# Patient Record
Sex: Female | Born: 1960
Health system: Southern US, Community
[De-identification: ages and names within clinical notes are randomized; demographics above are authoritative.]

## PROBLEM LIST (undated history)

## (undated) DIAGNOSIS — I10 Essential (primary) hypertension: Secondary | ICD-10-CM

## (undated) DIAGNOSIS — T783XXA Angioneurotic edema, initial encounter: Secondary | ICD-10-CM

## (undated) DIAGNOSIS — F419 Anxiety disorder, unspecified: Secondary | ICD-10-CM

## (undated) DIAGNOSIS — E785 Hyperlipidemia, unspecified: Secondary | ICD-10-CM

## (undated) DIAGNOSIS — Z8673 Personal history of transient ischemic attack (TIA), and cerebral infarction without residual deficits: Secondary | ICD-10-CM

## (undated) DIAGNOSIS — J189 Pneumonia, unspecified organism: Secondary | ICD-10-CM

## (undated) DIAGNOSIS — Z8489 Family history of other specified conditions: Secondary | ICD-10-CM

## (undated) DIAGNOSIS — F439 Reaction to severe stress, unspecified: Secondary | ICD-10-CM

## (undated) DIAGNOSIS — F410 Panic disorder [episodic paroxysmal anxiety] without agoraphobia: Secondary | ICD-10-CM

## (undated) DIAGNOSIS — F329 Major depressive disorder, single episode, unspecified: Secondary | ICD-10-CM

## (undated) DIAGNOSIS — E119 Type 2 diabetes mellitus without complications: Secondary | ICD-10-CM

## (undated) DIAGNOSIS — F32A Depression, unspecified: Secondary | ICD-10-CM

## (undated) HISTORY — DX: Depression, unspecified: F32.A

## (undated) HISTORY — DX: Personal history of transient ischemic attack (TIA), and cerebral infarction without residual deficits: Z86.73

## (undated) HISTORY — DX: Angioneurotic edema, initial encounter: T78.3XXA

## (undated) HISTORY — DX: Anxiety disorder, unspecified: F41.9

## (undated) HISTORY — DX: Type 2 diabetes mellitus without complications: E11.9

## (undated) HISTORY — DX: Hyperlipidemia, unspecified: E78.5

## (undated) HISTORY — DX: Major depressive disorder, single episode, unspecified: F32.9

## (undated) HISTORY — DX: Essential (primary) hypertension: I10

## (undated) HISTORY — DX: Panic disorder (episodic paroxysmal anxiety): F41.0

## (undated) HISTORY — DX: Reaction to severe stress, unspecified: F43.9

---

## 2006-09-26 ENCOUNTER — Emergency Department (HOSPITAL_COMMUNITY): Admission: EM | Admit: 2006-09-26 | Discharge: 2006-09-26 | Payer: Self-pay | Admitting: Emergency Medicine

## 2008-11-29 HISTORY — PX: CHOLECYSTECTOMY: SHX55

## 2011-11-30 DIAGNOSIS — I10 Essential (primary) hypertension: Secondary | ICD-10-CM

## 2011-11-30 HISTORY — DX: Essential (primary) hypertension: I10

## 2011-12-04 ENCOUNTER — Encounter: Payer: Self-pay | Admitting: *Deleted

## 2011-12-04 ENCOUNTER — Other Ambulatory Visit: Payer: Self-pay

## 2011-12-04 ENCOUNTER — Emergency Department (HOSPITAL_COMMUNITY)
Admission: EM | Admit: 2011-12-04 | Discharge: 2011-12-05 | Disposition: A | Discharge status: Home / Self Care | Payer: 59 | Attending: Emergency Medicine | Admitting: Emergency Medicine

## 2011-12-04 DIAGNOSIS — J45909 Unspecified asthma, uncomplicated: Secondary | ICD-10-CM | POA: Insufficient documentation

## 2011-12-04 DIAGNOSIS — R000 Tachycardia, unspecified: Secondary | ICD-10-CM | POA: Insufficient documentation

## 2011-12-04 DIAGNOSIS — Z79899 Other long term (current) drug therapy: Secondary | ICD-10-CM | POA: Insufficient documentation

## 2011-12-04 DIAGNOSIS — J40 Bronchitis, not specified as acute or chronic: Secondary | ICD-10-CM

## 2011-12-04 HISTORY — DX: Pneumonia, unspecified organism: J18.9

## 2011-12-04 LAB — D-DIMER, QUANTITATIVE: D-Dimer, Quant: 0.31 ug/mL-FEU (ref 0.00–0.48)

## 2011-12-04 LAB — POCT I-STAT TROPONIN I: Troponin i, poc: 0.01 ng/mL (ref 0.00–0.08)

## 2011-12-04 LAB — POCT I-STAT, CHEM 8
BUN: 14 mg/dL (ref 6–23)
Calcium, Ion: 1.2 mmol/L (ref 1.12–1.32)
Chloride: 105 mEq/L (ref 96–112)
Creatinine, Ser: 0.8 mg/dL (ref 0.50–1.10)
Glucose, Bld: 156 mg/dL — ABNORMAL HIGH (ref 70–99)
HCT: 45 % (ref 36.0–46.0)
Hemoglobin: 15.3 g/dL — ABNORMAL HIGH (ref 12.0–15.0)
Potassium: 3.6 mEq/L (ref 3.5–5.1)
Sodium: 142 mEq/L (ref 135–145)
TCO2: 25 mmol/L (ref 0–100)

## 2011-12-04 MED ORDER — HYDROCOD POLST-CHLORPHEN POLST 10-8 MG/5ML PO LQCR
5.0000 mL | Freq: Once | ORAL | Status: AC
  Administered 2011-12-04: 5 mL via ORAL
  Filled 2011-12-04: qty 5

## 2011-12-04 MED ORDER — IPRATROPIUM BROMIDE 0.02 % IN SOLN
0.5000 mg | Freq: Once | RESPIRATORY_TRACT | Status: AC
  Administered 2011-12-05: 0.5 mg via RESPIRATORY_TRACT
  Filled 2011-12-04: qty 2.5

## 2011-12-04 MED ORDER — ALBUTEROL SULFATE (5 MG/ML) 0.5% IN NEBU
5.0000 mg | INHALATION_SOLUTION | Freq: Once | RESPIRATORY_TRACT | Status: AC
  Administered 2011-12-05: 5 mg via RESPIRATORY_TRACT
  Filled 2011-12-04: qty 0.5

## 2011-12-04 NOTE — ED Notes (Signed)
Pt reports cold for about a week that started on 12/13 "that lingered", recently treated with antibiotics and steroids by PCP with worsening symptoms so came to ED.

## 2011-12-04 NOTE — ED Provider Notes (Signed)
History     CSN: [id number]  Arrival date & time 12/04/11  1745   First MD Initiated Contact with Patient 12/04/11 2158      Chief Complaint  Patient presents with  . Shortness of Breath    HPI: Patient is a 51 y.o. female presenting with shortness of breath. The history is provided by the patient.  Shortness of Breath  The current episode started 5 to 7 days ago. The problem occurs continuously. The problem has been gradually worsening. The problem is moderate. The symptoms are relieved by rest. Associated symptoms include chest pressure, cough, shortness of breath and wheezing. Pertinent negatives include no fever. Her past medical history does not include asthma or past wheezing. There were no sick contacts.  Pt reports  onset of cold sx's on 11/11/2011. Cold lasted approximately one week but  persistent dry cough continued. Was seen by her primary care physician this past Wednesday for same, and placed on Z-Pak, prednisone, cough medicine, and inhaler. Since that time her cough has worsened, is now more productive and is now associated w/ chest pressure and shortness of .breath with activity and lying flat. Patient reports extensive travel by vehicle over the holidays. States traveled from here to [place] and [place] and then from [place] back here  on New Year's Day. Patient admits that she travels extensively by car and air both nationally and internationally for her job.  Past Medical History  Diagnosis Date  . Asthma   . Pneumonia     Past Surgical History  Procedure Date  . Cholecystectomy     Family History  Problem Relation Age of Onset  . Hypertension Mother   . Cancer Mother   . Hypertension Father   . Stroke Father     History  Substance Use Topics  . Smoking status: Never Smoker   . Smokeless tobacco: Never Used  . Alcohol Use: Yes     twice a year    OB History    Grav Para Term Preterm Abortions TAB SAB Ect Mult Living                   Review of Systems  Constitutional: Negative.  Negative for fever.  HENT: Negative.   Eyes: Negative.   Respiratory: Positive for cough, shortness of breath and wheezing.   Cardiovascular: Negative.   Gastrointestinal: Negative.   Genitourinary: Negative.   Musculoskeletal: Negative.   Skin: Negative.   Neurological: Negative.   Hematological: Negative.   Psychiatric/Behavioral: Negative.     Allergies  Review of patient's allergies indicates not on file.  Home Medications   Current Outpatient Rx  Name Route Sig Dispense Refill  . ALBUTEROL SULFATE HFA 108 (90 BASE) MCG/ACT IN AERS Inhalation Inhale 2 puffs into the lungs every 6 (six) hours as needed.      . AZITHROMYCIN 250 MG PO TABS Oral Take 250 mg by mouth daily. Started on Wednesday        BP 172/93  Pulse 106  Temp(Src) 98.6 F (37 C) (Oral)  Resp 20  Wt 200 lb (90.719 kg)  SpO2 96%  LMP 11/25/2011  Physical Exam  Constitutional: She is oriented to person, place, and time. She appears well-developed and well-nourished.  HENT:  Head: Normocephalic and atraumatic.  Eyes: Conjunctivae are normal.  Neck: Neck supple.  Cardiovascular: Normal rate and regular rhythm.   Pulmonary/Chest: She has decreased breath sounds. She has wheezes. She has no rhonchi. She has no rales.  Inspiratory/Expiratory wheezes L>R w/ frequent spastic type cough most most inspirations.  Mild tachypnea (24) and tachycardia (120)  Abdominal: Soft. Bowel sounds are normal.  Musculoskeletal: Normal range of motion.  Neurological: She is alert and oriented to person, place, and time.  Skin: Skin is warm and dry. Rash noted. Rash is papular. No erythema.  Psychiatric: She has a normal mood and affect.    ED Course  Procedures Pt w/ persistent, worsening cough that has not responded to Z-pack, Albuterol inhaler or Tussinex since Wed. Pt w/ hx of frequent travel and extended car travel over holidays.Will plan for neb here, tussinex,  D-Dimer and appropriate imaging and re-eval.   0015: D-dimer negative, chest x-ray shows peribronchial thickening consistent with bronchitis without focal pneumonia.  Patient admits neb treatment here has significantly helped her cough. States neb helped in PCP office as well, but she finds that the albuterol inhaler does not help at home. Discussed with patient that we would refill her Tussionex, and an aero-chamber to her inhaler treatments at home and provide referral to pulmonologist for further evaluation. I have discussed this patient with Dr. Patria Mane who is in agreement with discharge plan.  Patient and spouse verbalized understanding and are agreeable with plan to.   Labs Reviewed  I-STAT, CHEM 8  D-DIMER, QUANTITATIVE  POCT PREGNANCY, URINE   No results found.   No diagnosis found.    MDM  HPI/PE and clinical findings c/w bronchitis Pt w/ mild tachycardia (102) upon arrival now (116) after neb likely result of coughing and Beta adrenergic agonist effects of Albuterol HFA.          Roma Kayser Schorr, NP 12/05/11 [phone]

## 2011-12-05 ENCOUNTER — Emergency Department (HOSPITAL_COMMUNITY): Payer: 59

## 2011-12-05 LAB — URINALYSIS, ROUTINE W REFLEX MICROSCOPIC
Bilirubin Urine: NEGATIVE
Glucose, UA: NEGATIVE mg/dL
Hgb urine dipstick: NEGATIVE
Ketones, ur: NEGATIVE mg/dL
Leukocytes, UA: NEGATIVE
Nitrite: NEGATIVE
Protein, ur: NEGATIVE mg/dL
Specific Gravity, Urine: 1.022 (ref 1.005–1.030)
Urobilinogen, UA: 0.2 mg/dL (ref 0.0–1.0)
pH: 6.5 (ref 5.0–8.0)

## 2011-12-05 LAB — PREGNANCY, URINE: Preg Test, Ur: NEGATIVE

## 2011-12-05 MED ORDER — DIAZEPAM 5 MG PO TABS: 5.0000 mg | ORAL_TABLET | Freq: Every evening | ORAL | Status: AC | PRN

## 2011-12-05 MED ORDER — ALBUTEROL SULFATE HFA 108 (90 BASE) MCG/ACT IN AERS
2.0000 | INHALATION_SPRAY | RESPIRATORY_TRACT | Status: DC | PRN
  Administered 2011-12-05: 2 via RESPIRATORY_TRACT
  Filled 2011-12-05: qty 6.7

## 2011-12-05 MED ORDER — HYDROCOD POLST-CHLORPHEN POLST 10-8 MG/5ML PO LQCR: 5.0000 mL | Freq: Two times a day (BID) | ORAL | Status: DC | PRN

## 2011-12-05 NOTE — ED Notes (Signed)
Breathing treatment given at time order placed,  Charted late due to other emergency with another pt.

## 2011-12-05 NOTE — ED Provider Notes (Signed)
Medical screening examination/treatment/procedure(s) were performed by non-physician practitioner and as supervising physician I was immediately available for consultation/collaboration.   Kevin M Campos, MD 12/05/11 0109 

## 2011-12-06 ENCOUNTER — Telehealth: Payer: Self-pay

## 2011-12-06 NOTE — Telephone Encounter (Signed)
I spoke with spouse and pt is scheduled to come in and see PW on 12/07/11 at 11:15. Spouse is aware to have pt arrive 15 minutes early to fill out paperwork.

## 2011-12-07 ENCOUNTER — Ambulatory Visit (INDEPENDENT_AMBULATORY_CARE_PROVIDER_SITE_OTHER)
Admission: RE | Admit: 2011-12-07 | Discharge: 2011-12-07 | Disposition: A | Discharge status: Home / Self Care | Payer: 59 | Source: Ambulatory Visit

## 2011-12-07 ENCOUNTER — Encounter: Payer: Self-pay | Admitting: Critical Care Medicine

## 2011-12-07 ENCOUNTER — Ambulatory Visit (INDEPENDENT_AMBULATORY_CARE_PROVIDER_SITE_OTHER): Payer: 59 | Admitting: Critical Care Medicine

## 2011-12-07 VITALS — BP 140/100 | HR 121 | Temp 98.6°F | Ht 66.0 in | Wt 212.2 lb

## 2011-12-07 DIAGNOSIS — J45909 Unspecified asthma, uncomplicated: Secondary | ICD-10-CM | POA: Insufficient documentation

## 2011-12-07 DIAGNOSIS — R059 Cough, unspecified: Secondary | ICD-10-CM

## 2011-12-07 DIAGNOSIS — J329 Chronic sinusitis, unspecified: Secondary | ICD-10-CM

## 2011-12-07 DIAGNOSIS — R05 Cough: Secondary | ICD-10-CM

## 2011-12-07 MED ORDER — HYDROCOD POLST-CPM POLST ER 10-8 MG PO CP12: 1.0000 | ORAL_CAPSULE | Freq: Two times a day (BID) | ORAL | Status: DC | PRN

## 2011-12-07 MED ORDER — AMOXICILLIN-POT CLAVULANATE 875-125 MG PO TABS: 1.0000 | ORAL_TABLET | Freq: Two times a day (BID) | ORAL | Status: AC

## 2011-12-07 MED ORDER — BENZONATATE 100 MG PO CAPS: ORAL_CAPSULE | ORAL | Status: AC

## 2011-12-07 MED ORDER — OMEPRAZOLE 20 MG PO CPDR: 20.0000 mg | DELAYED_RELEASE_CAPSULE | Freq: Every day | ORAL | Status: DC

## 2011-12-07 MED ORDER — PREDNISONE 10 MG PO TABS: ORAL_TABLET | ORAL | Status: DC

## 2011-12-07 NOTE — Patient Instructions (Signed)
USe cough protocol with tussicaps/tessalon Start omeprazole one daily Start reflux diet : strict Start Augmentin one twice daily  Prednisone recycle 10mg   Take 4 for two days three for two days two for two days one for two days Use albuterol as needed Obtain a CT scan of sinuses Allergy panel/hypersensitivity panel today (lab draw) Return 3 weeks

## 2011-12-07 NOTE — Progress Notes (Signed)
Subjective:    Patient ID: Sheila Guerrero, female    DOB: [birth year], 51 y.o.   MRN: [id number]  HPI Comments: Typical URI viral mid 12/12.  Traveled over the [place],  Then saw PCP 12/01/11 and dx asthma, bronchitis.  Severe allergies as child but not asthma. Rx albuterol/zpak/pred and BD neb in office. Then developed a fever and worse. Over the weekend went to ED. In ED rx another neb med, r/o PE.  CXR: neg.  Not able to sleep or recline and holds a pillow. Rx valium and slept with this. Now is still coughing   Cough This is a new problem. The current episode started 1 to 4 weeks ago. The problem has been rapidly worsening. The problem occurs constantly. The cough is productive of sputum (mucus is clear). Associated symptoms include ear pain, a fever, nasal congestion, postnasal drip, a rash, rhinorrhea, a sore throat, shortness of breath and wheezing. Pertinent negatives include no chest pain, chills, ear congestion, eye redness, headaches, heartburn, hemoptysis or myalgias. Associated symptoms comments: Has chest heaviness. The symptoms are aggravated by exercise and lying down. Risk factors: mold exposure with bathrooms over summer  She has tried a beta-agonist inhaler and oral steroids (zpak) for the symptoms. The treatment provided no relief. Her past medical history is significant for asthma, bronchitis, environmental allergies and pneumonia. There is no history of bronchiectasis, COPD or emphysema. hx pneumonia 4 yrs ago, sees [place] allergy: cat, dog, mold, dust, angioedema of eyes       Review of Systems  Constitutional: Positive for fever. Negative for chills, appetite change and unexpected weight change.  HENT: Positive for ear pain, congestion, sore throat, rhinorrhea, sneezing, trouble swallowing, postnasal drip and sinus pressure. Negative for nosebleeds and dental problem.   Eyes: Negative for redness and itching.  Respiratory: Positive for cough, chest tightness, shortness  of breath and wheezing. Negative for hemoptysis.   Cardiovascular: Negative for chest pain, palpitations and leg swelling.  Gastrointestinal: Negative for heartburn, nausea, vomiting and diarrhea.  Genitourinary: Negative for dysuria.  Musculoskeletal: Negative for myalgias and joint swelling.  Skin: Positive for rash.  Neurological: Negative for headaches.  Hematological: Positive for environmental allergies. Does not bruise/bleed easily.  Psychiatric/Behavioral: Negative for dysphoric mood. The patient is not nervous/anxious.        Objective:   Physical Exam  Filed Vitals:   12/07/11 1111  BP: 140/100  Pulse: 121  Temp: 98.6 F (37 C)  TempSrc: Oral  Height: 5\' 6"  (1.676 m)  Weight: 212 lb 3.2 oz (96.253 kg)  SpO2: 93%    Gen: Pleasant, obese , in no distress,  normal affect  ENT: No lesions,  mouth clear,  oropharynx clear, +++ postnasal drip, bilateral nasal purulence and edema of turbinates  Neck: No JVD, no TMG, no carotid bruits  Lungs: No use of accessory muscles, no dullness to percussion, distant BS, pseudowheeze noted   Cardiovascular: RRR, heart sounds normal, no murmur or gallops, no peripheral edema  Abdomen: soft and NT, no HSM,  BS normal  Musculoskeletal: No deformities, no cyanosis or clubbing  Neuro: alert, non focal  Skin: Warm, no lesions or rashes  Ct Maxillofacial Ltd Wo Cm  12/07/2011  *RADIOLOGY REPORT*  Clinical Data:  Cough and sinus pressure.  CT LIMITED SINUSES WITHOUT CONTRAST  Technique:  Multidetector CT images of the paranasal sinuses were obtained in a single plane without contrast.  Comparison:  None  Findings:  There is mild mucosal thickening in the left sphenoid  sinus and left posterior ethmoid air cells.  Minimal disease in the left maxillary sinus.  Visualized right paranasal sinuses are clear.  Visualized intracranial structures are grossly normal.  No gross abnormality to the globes or orbits.  IMPRESSION: Mild sinus disease in  the left sphenoid sinus and left ethmoid air cells.  Original Report Authenticated By: Richarda Overlie, M.D.         Assessment & Plan:   Cough Cyclical cough d/t GERD and sinusitis with post nasal drip .  I doubt asthma or airway obstruction CT scan reveals sinusitis.   Plan USe cough protocol with tussicaps/tessalon Start omeprazole one daily Start reflux diet : strict Start Augmentin one twice daily  Prednisone recycle 10mg   Take 4 for two days three for two days two for two days one for two days Use albuterol as needed Allergy panel/hypersensitivity panel today (lab draw) Return 3 weeks     Updated Medication List Outpatient Encounter Prescriptions as of 12/07/2011  Medication Sig Dispense Refill  . albuterol (PROVENTIL HFA;VENTOLIN HFA) 108 (90 BASE) MCG/ACT inhaler Inhale 2 puffs into the lungs every 4 (four) hours as needed.       . cetirizine (ZYRTEC) 10 MG tablet Take 10 mg by mouth daily as needed.        . diazepam (VALIUM) 5 MG tablet Take 1 tablet (5 mg total) by mouth at bedtime as needed for anxiety or sleep.  10 tablet  0  . amoxicillin-clavulanate (AUGMENTIN) 875-125 MG per tablet Take 1 tablet by mouth 2 (two) times daily.  20 tablet  0  . benzonatate (TESSALON) 100 MG capsule Take 1-2 every 4hours per cough protocol  90 capsule  4  . Hydrocod Polst-Chlorphen Polst (TUSSICAPS) 10-8 MG CP12 Take 1 capsule by mouth 2 (two) times daily as needed. Per cough protocol  20 each  0  . omeprazole (PRILOSEC) 20 MG capsule Take 1 capsule (20 mg total) by mouth daily.  30 capsule  4  . predniSONE (DELTASONE) 10 MG tablet Take 4 for two days three for two days two for two days one for two days  20 tablet  0  . DISCONTD: azithromycin (ZITHROMAX) 250 MG tablet Take 250 mg by mouth daily. Started on Wednesday        . DISCONTD: chlorpheniramine-HYDROcodone (TUSSIONEX PENNKINETIC ER) 10-8 MG/5ML LQCR Take 5 mLs by mouth every 12 (twelve) hours as needed.  40 mL  0  . DISCONTD:  predniSONE (DELTASONE) 10 MG tablet Take 10 mg by mouth daily. 6 day does pack starting with 6 the first day decreasing one tablet daily for the next five days

## 2011-12-09 NOTE — Assessment & Plan Note (Addendum)
Cyclical cough d/t GERD and sinusitis with post nasal drip .  I doubt asthma or airway obstruction CT scan reveals sinusitis.   Plan USe cough protocol with tussicaps/tessalon Start omeprazole one daily Start reflux diet : strict Start Augmentin one twice daily  Prednisone recycle 10mg   Take 4 for two days three for two days two for two days one for two days Use albuterol as needed Allergy panel/hypersensitivity panel today (lab draw) Return 3 weeks

## 2011-12-13 ENCOUNTER — Other Ambulatory Visit: Payer: 59

## 2011-12-13 DIAGNOSIS — J329 Chronic sinusitis, unspecified: Secondary | ICD-10-CM

## 2011-12-13 DIAGNOSIS — R059 Cough, unspecified: Secondary | ICD-10-CM

## 2011-12-13 DIAGNOSIS — R05 Cough: Secondary | ICD-10-CM

## 2011-12-14 LAB — ALLERGY FULL PROFILE
Allergen, D pternoyssinus,d7: <0.1 kU/L (ref <0.35)
Allergen,Goose feathers, e70: <0.1 kU/L (ref <0.35)
Alternaria Alternata: <0.1 kU/L (ref <0.35)
Aspergillus fumigatus, IgG: <0.1 kU/L (ref <0.35)
Bahia Grass: <0.1 kU/L (ref <0.35)
Bermuda Grass: <0.1 kU/L (ref <0.35)
Box Elder IgE: <0.1 kU/L (ref <0.35)
Candida Albicans: <0.1 kU/L (ref <0.35)
Cat Dander: 1.07 kU/L — ABNORMAL HIGH (ref <0.35)
Common Ragweed: <0.1 kU/L (ref <0.35)
Curvularia lunata: <0.1 kU/L (ref <0.35)
D. farinae: <0.1 kU/L (ref <0.35)
Dog Dander: 5.07 kU/L — ABNORMAL HIGH (ref <0.35)
Elm IgE: <0.1 kU/L (ref <0.35)
Fescue: <0.1 kU/L (ref <0.35)
G005 Rye, Perennial: <0.1 kU/L (ref <0.35)
G009 Red Top: <0.1 kU/L (ref <0.35)
Goldenrod: <0.1 kU/L (ref <0.35)
Helminthosporium halodes: <0.1 kU/L (ref <0.35)
House Dust Hollister: 1.75 kU/L — ABNORMAL HIGH (ref <0.35)
IgE (Immunoglobulin E), Serum: 19.9 IU/mL (ref 0.0–180.0)
Lamb's Quarters: <0.1 kU/L (ref <0.35)
Oak: <0.1 kU/L (ref <0.35)
Plantain: <0.1 kU/L (ref <0.35)
Stemphylium Botryosum: <0.1 kU/L (ref <0.35)
Sycamore Tree: <0.1 kU/L (ref <0.35)
Timothy Grass: <0.1 kU/L (ref <0.35)

## 2011-12-15 ENCOUNTER — Telehealth: Payer: Self-pay | Admitting: Critical Care Medicine

## 2011-12-15 NOTE — Telephone Encounter (Signed)
Overbook the pt

## 2011-12-15 NOTE — Telephone Encounter (Signed)
Her allergy test shows multiple positive allergies Print a copy and send to her, we will review at next OV  I agree, no sleep is d/t prednisone, this will get better with time. She can try melatonin 3mg  po qhs   OTC for sleep

## 2011-12-15 NOTE — Telephone Encounter (Signed)
Called and spoke with pt. She states just finished pred taper yesterday and her cough is better so she is not taking the tussi caps anymore. She states that ever since she has been on prednisone she is only able to sleep a couple of hours every night. I advised that this is probably a s/e of the prednisone and should get better now since she has finished med. Pt verbalized understanding and states will see how she sleep tonight now off of pred.  She is requesting her lab results. Dr. Delford Field, please advise, thanks!

## 2011-12-15 NOTE — Telephone Encounter (Signed)
Pt is aware of recommendations from PW and labs placed in mail. Pt needs to schedule appt with PW around 12-28-2011 per last OV and no openings; Crystal and PW please advise and let patient know. Thanks.

## 2011-12-16 NOTE — Telephone Encounter (Signed)
ATC pt at home #.  NA and no option to leave message.  ATC pt on mobile #.  LMOM for pt TCB

## 2011-12-16 NOTE — Telephone Encounter (Signed)
Pt's husband, Thurmond Butts, called back and scheduled pt a f/u appt with PW for 12/28/11 at 10:00

## 2011-12-20 LAB — HYPERSENSITIVITY PNUEMONITIS PROFILE

## 2011-12-20 LAB — FUNGAL ANTIBODIES PANEL, ID-BLOOD
Aspergillus Flavus Antibodies: NEGATIVE
Aspergillus Niger Antibodies: NEGATIVE
Aspergillus fumigatus: NEGATIVE
Blastomyces Abs, Qn, DID: NEGATIVE
Coccidioides Antibody ID: NEGATIVE
Histoplasma Antibody, ID: NEGATIVE

## 2011-12-20 NOTE — Progress Notes (Signed)
Quick Note:  Called, spoke with pt's husband. Informed him allergy test is positive for allergies to mold and cat/dog dander per PW. He verbalized understanding of this and will inform pt. ______

## 2011-12-28 ENCOUNTER — Telehealth: Payer: Self-pay | Admitting: Critical Care Medicine

## 2011-12-28 ENCOUNTER — Ambulatory Visit (INDEPENDENT_AMBULATORY_CARE_PROVIDER_SITE_OTHER): Payer: 59 | Admitting: Critical Care Medicine

## 2011-12-28 ENCOUNTER — Encounter: Payer: Self-pay | Admitting: Critical Care Medicine

## 2011-12-28 DIAGNOSIS — J329 Chronic sinusitis, unspecified: Secondary | ICD-10-CM | POA: Insufficient documentation

## 2011-12-28 DIAGNOSIS — R059 Cough, unspecified: Secondary | ICD-10-CM

## 2011-12-28 DIAGNOSIS — R05 Cough: Secondary | ICD-10-CM

## 2011-12-28 DIAGNOSIS — J45909 Unspecified asthma, uncomplicated: Secondary | ICD-10-CM

## 2011-12-28 DIAGNOSIS — J309 Allergic rhinitis, unspecified: Secondary | ICD-10-CM

## 2011-12-28 MED ORDER — BUDESONIDE 180 MCG/ACT IN AEPB: 2.0000 | INHALATION_SPRAY | Freq: Two times a day (BID) | RESPIRATORY_TRACT | Status: DC

## 2011-12-28 MED ORDER — CICLESONIDE 37 MCG/ACT NA AERS: 1.0000 | INHALATION_SPRAY | Freq: Every day | NASAL | Status: DC

## 2011-12-28 NOTE — Progress Notes (Signed)
Subjective:    Patient ID: Sheila Guerrero, female    DOB: 1961/05/18, 51 y.o.   MRN: [id number]  Cough This is a new problem. The current episode started 1 to 4 weeks ago. The problem has been gradually improving. Episode frequency: worse at night and wheezes. The cough is productive of sputum (mucus is clear). Associated symptoms include nasal congestion, postnasal drip, shortness of breath and wheezing. Pertinent negatives include no chest pain, chills, ear congestion, ear pain, eye redness, fever, headaches, heartburn, hemoptysis, myalgias, rash, rhinorrhea or sore throat. Associated symptoms comments: Has chest heaviness. The symptoms are aggravated by exercise and lying down. Risk factors: mold exposure with bathrooms over summer  She has tried a beta-agonist inhaler and oral steroids (zpak) for the symptoms. The treatment provided no relief. Her past medical history is significant for asthma, bronchitis, environmental allergies and pneumonia. There is no history of bronchiectasis, COPD or emphysema. hx pneumonia 4 yrs ago, sees [place] allergy: cat, dog, mold, dust, angioedema of eyes    12/28/2011 At last OV we rec: USe cough protocol with tussicaps/tessalon Start omeprazole one daily Start reflux diet : strict Start Augmentin one twice daily  Prednisone recycle 10mg   Take 4 for two days three for two days two for two days one for two days Use albuterol as needed Allergy panel/hypersensitivity panel today (lab draw)>>>pos cat/dog dander, mold, house dust, IgE 20  Now only coughs at night, still clears throat in daytime. Now notes some sinus pressure.  Notes dyspnea at time and not able to pull up a full breath. No heartburn. No gas.  No dysphagia   Past Medical History  Diagnosis Date  . Asthma   . Pneumonia   . HTN (hypertension) 2013     Family History  Problem Relation Age of Onset  . Hypertension Mother   . Cancer Mother   . Hypertension Father   . Stroke Father       History   Social History  . Marital Status: Married    Spouse Name: N/A    Number of Children: N/A  . Years of Education: N/A   Occupational History  . Not on file.   Social History Main Topics  . Smoking status: Never Smoker   . Smokeless tobacco: Never Used  . Alcohol Use: Yes     twice a year  . Drug Use: No  . Sexually Active: Not on file   Other Topics Concern  . Not on file   Social History Narrative  . No narrative on file     No Known Allergies   Outpatient Prescriptions Prior to Visit  Medication Sig Dispense Refill  . albuterol (PROVENTIL HFA;VENTOLIN HFA) 108 (90 BASE) MCG/ACT inhaler Inhale 2 puffs into the lungs every 4 (four) hours as needed.       . cetirizine (ZYRTEC) 10 MG tablet Take 10 mg by mouth daily as needed.        Marland Kitchen omeprazole (PRILOSEC) 20 MG capsule Take 1 capsule (20 mg total) by mouth daily.  30 capsule  4  . Hydrocod Polst-Chlorphen Polst (TUSSICAPS) 10-8 MG CP12 Take 1 capsule by mouth 2 (two) times daily as needed. Per cough protocol  20 each  0  . predniSONE (DELTASONE) 10 MG tablet Take 4 for two days three for two days two for two days one for two days  20 tablet  0     Review of Systems  Constitutional: Negative for fever, chills, appetite change and unexpected weight  change.  HENT: Positive for congestion, postnasal drip and sinus pressure. Negative for ear pain, nosebleeds, sore throat, rhinorrhea, sneezing, trouble swallowing and dental problem.   Eyes: Negative for redness and itching.  Respiratory: Positive for cough, shortness of breath and wheezing. Negative for hemoptysis and chest tightness.   Cardiovascular: Negative for chest pain, palpitations and leg swelling.  Gastrointestinal: Negative for heartburn, nausea, vomiting and diarrhea.  Genitourinary: Negative for dysuria.  Musculoskeletal: Negative for myalgias and joint swelling.  Skin: Negative for rash.  Neurological: Negative for headaches.  Hematological:  Positive for environmental allergies. Does not bruise/bleed easily.  Psychiatric/Behavioral: Negative for dysphoric mood. The patient is not nervous/anxious.        Objective:   Physical Exam   Filed Vitals:   12/28/11 1006  BP: 138/100  Pulse: 121  Temp: 98.4 F (36.9 C)  TempSrc: Oral  Height: 5\' 6"  (1.676 m)  Weight: 214 lb 3.2 oz (97.16 kg)  SpO2: 99%    Gen: Pleasant, obese , in no distress,  normal affect  ENT: No lesions,  mouth clear,  oropharynx clear, + postnasal drip, reduced  bilateral nasal purulence and  Persistent edema of turbinates  Neck: No JVD, no TMG, no carotid bruits  Lungs: No use of accessory muscles, no dullness to percussion, distant BS, less  pseudowheeze noted   Cardiovascular: RRR, heart sounds normal, no murmur or gallops, no peripheral edema  Abdomen: soft and NT, no HSM,  BS normal  Musculoskeletal: No deformities, no cyanosis or clubbing  Neuro: alert, non focal  Skin: Warm, no lesions or rashes  CT Sinus: moderate  L sphenoid ethmoid sinusitis        Assessment & Plan:   Extrinsic asthma, unspecified Moderate persistent asthma with nocturnal symptoms exacerbated by reflux and associated postnasal drip syndrome with chronic allergic rhinitis associated chronic sinusitis and hypersensitivity to Aspergillus [place] and flavus and cat dog dander along with house dust  patient is marginally improved Plan Begin inhaled steroid with Pulmicort 2 inhalations  twice daily Begin nasal steroid 1 spray each nostril daily Referral to allergy will be made     Updated Medication List Outpatient Encounter Prescriptions as of 12/28/2011  Medication Sig Dispense Refill  . albuterol (PROVENTIL HFA;VENTOLIN HFA) 108 (90 BASE) MCG/ACT inhaler Inhale 2 puffs into the lungs every 4 (four) hours as needed.       . cetirizine (ZYRTEC) 10 MG tablet Take 10 mg by mouth daily as needed.        Marland Kitchen omeprazole (PRILOSEC) 20 MG capsule Take 1 capsule (20 mg  total) by mouth daily.  30 capsule  4  . budesonide (PULMICORT FLEXHALER) 180 MCG/ACT inhaler Inhale 2 puffs into the lungs 2 (two) times daily.  1 each  5  . Ciclesonide (ZETONNA) 37 MCG/ACT AERS Place 1 puff into the nose daily.  1 Inhaler  6  . DISCONTD: Hydrocod Polst-Chlorphen Polst (TUSSICAPS) 10-8 MG CP12 Take 1 capsule by mouth 2 (two) times daily as needed. Per cough protocol  20 each  0  . DISCONTD: predniSONE (DELTASONE) 10 MG tablet Take 4 for two days three for two days two for two days one for two days  20 tablet  0

## 2011-12-28 NOTE — Assessment & Plan Note (Signed)
Moderate persistent asthma with nocturnal symptoms exacerbated by reflux and associated postnasal drip syndrome with chronic allergic rhinitis associated chronic sinusitis and hypersensitivity to Aspergillus [place] and flavus and cat dog dander along with house dust  patient is marginally improved Plan Begin inhaled steroid with Pulmicort 2 inhalations  twice daily Begin nasal steroid 1 spray each nostril daily Referral to allergy will be made

## 2011-12-28 NOTE — Telephone Encounter (Signed)
I spoke with R[place] and she will re-fax forms they are needing Dr. Delford Field to fill out and fax back. She said the forms are needed by Feb 3rd. I asked that she send these to the attention of Crystal and will place this msg in Crystals box as a reminder. I asked that the forms be faxed to triage and will hand forms to Crystal once received.

## 2011-12-28 NOTE — Patient Instructions (Signed)
Stay on omeprazole Start pulmicort two puff twice daily Start Zetonna one puff daily each nostril, ok to substitute Referral to allergy to be made Return 2 months

## 2011-12-29 NOTE — Telephone Encounter (Signed)
Forms received and placed in PW look at. Will forward msg to Crystal and Dr. Delford Field.

## 2011-12-29 NOTE — Telephone Encounter (Signed)
Noted  But prob wont get to this until next week Advise them as such

## 2011-12-29 NOTE — Telephone Encounter (Signed)
PW reviewed this paperwork.  It needs to go to [employer].  I have sent it to them.  Will you please inform pt of this.  Thanks!

## 2011-12-29 NOTE — Telephone Encounter (Signed)
I spoke with pt and made her aware of this. She needed nothing further

## 2011-12-29 NOTE — Telephone Encounter (Signed)
Pt aware. I will forward message back to Crystal as a reminder. Carron Curie, CMA

## 2012-01-06 ENCOUNTER — Telehealth: Payer: Self-pay | Admitting: Critical Care Medicine

## 2012-01-06 MED ORDER — FLUTICASONE PROPIONATE 50 MCG/ACT NA SUSP: 2.0000 | Freq: Every day | NASAL | Status: DC

## 2012-01-06 NOTE — Telephone Encounter (Signed)
flonase ok with me: two puff daily each nostril

## 2012-01-06 NOTE — Telephone Encounter (Signed)
Dede Query is not covered by [employer]. She must first try at least one of the covered alternatives:  Fluticasone, Flunisolide, Nasonex or Triamcinolone. Per patient, she has never tried any other nasal sprays in the past and is fine with switching to Fluticasone. This is usually the cheaper alternative and she will call if she has any problems or this does not work for her. I will forward this to Dr. Delford Field so he is aware.

## 2012-01-06 NOTE — Telephone Encounter (Signed)
Received copies from [facility] 01/06/12. Forwarded  4pages to Dr. Sandra Cockayne review.

## 2012-01-10 ENCOUNTER — Telehealth: Payer: Self-pay | Admitting: Critical Care Medicine

## 2012-01-10 NOTE — Telephone Encounter (Signed)
I spoke with member services at [phone]. Member # [id number].  Patient needs to meet her remaining deductible of $161.96 before she will be eligible for a regular co-pay. Representative could not tell me what that co-pay would be. According to her insurance, it does not matter which medication she is being prescribed, she will still need to pay and meet that deductible.  Patient was notified of this and said she will pay the remaining deductible and pick up her inhaler. She no longer wants this medication changed. She will call if she has any further questions or problems.

## 2012-01-10 NOTE — Telephone Encounter (Signed)
Ok to substitute whatever is their tier one inhaled steroid same dose

## 2012-01-10 NOTE — Telephone Encounter (Signed)
Fax received from the pharmacy requesting a substitute for Pulmicort Flexhaler due to cost. This inhaler will cost the patient $161.96 every month. Please advise if an appropriate alternative is available for the patient.No Known Allergies

## 2012-02-11 ENCOUNTER — Telehealth: Payer: Self-pay | Admitting: Critical Care Medicine

## 2012-02-11 MED ORDER — AZITHROMYCIN 250 MG PO TABS: ORAL_TABLET | ORAL | Status: AC

## 2012-02-11 NOTE — Telephone Encounter (Signed)
Spoke with pt. She c/o dry hacky cough, "raw throat" and increased wheezing over the past 24 hours. She states that she woke up this am and "felt flushed", unsure if has fever or not. She states that she could not sleep last night due to cough and had to rest propped up in the bed, could not lie flat b/c this seemed to make wheeze and cough worse. She is unable to come in for ov today, currently in [place]. Please advise Dr. Delford Field, thanks! No Known Allergies

## 2012-02-11 NOTE — Telephone Encounter (Signed)
Call in to a local pharmacy: azithromycin 250mg  Take two once then one daily until gone  #6

## 2012-02-11 NOTE — Telephone Encounter (Signed)
I spoke with pt and is aware of PW recs. She stated she will be home tonight and would like rx sent to [address]. I have sent in rx and nothing further was needed

## 2012-04-05 ENCOUNTER — Telehealth: Payer: Self-pay | Admitting: Critical Care Medicine

## 2012-04-05 NOTE — Telephone Encounter (Signed)
Received copies from [facility] ,on 04/05/12 . Forwarded 4  pages to Dr. Delford Field ,for review.

## 2014-11-05 ENCOUNTER — Telehealth: Payer: Self-pay | Admitting: Critical Care Medicine

## 2014-11-05 NOTE — Telephone Encounter (Signed)
Rec'd from [place] forward 5 pages to Dr. Joya Gaskins

## 2015-04-14 ENCOUNTER — Telehealth: Payer: Self-pay | Admitting: Critical Care Medicine

## 2015-04-14 NOTE — Telephone Encounter (Signed)
Received records from [place] sent to [place] 04/14/15 fbg.

## 2015-08-29 ENCOUNTER — Telehealth: Payer: Self-pay | Admitting: Critical Care Medicine

## 2015-08-29 NOTE — Telephone Encounter (Signed)
Rec'd from [facility] forward 3 pages to Dr. Joya Gaskins

## 2016-04-27 ENCOUNTER — Ambulatory Visit: Payer: 59 | Admitting: Dietician

## 2017-02-04 ENCOUNTER — Ambulatory Visit (INDEPENDENT_AMBULATORY_CARE_PROVIDER_SITE_OTHER): Payer: 59 | Admitting: Psychiatry

## 2017-02-04 ENCOUNTER — Encounter (INDEPENDENT_AMBULATORY_CARE_PROVIDER_SITE_OTHER): Payer: Self-pay

## 2017-02-04 ENCOUNTER — Encounter (HOSPITAL_COMMUNITY): Payer: Self-pay | Admitting: Psychiatry

## 2017-02-04 VITALS — BP 148/90 | HR 118

## 2017-02-04 DIAGNOSIS — F410 Panic disorder [episodic paroxysmal anxiety] without agoraphobia: Secondary | ICD-10-CM | POA: Diagnosis not present

## 2017-02-04 DIAGNOSIS — F4322 Adjustment disorder with anxiety: Secondary | ICD-10-CM | POA: Diagnosis not present

## 2017-02-04 DIAGNOSIS — Z79899 Other long term (current) drug therapy: Secondary | ICD-10-CM | POA: Diagnosis not present

## 2017-02-04 DIAGNOSIS — Z818 Family history of other mental and behavioral disorders: Secondary | ICD-10-CM | POA: Diagnosis not present

## 2017-02-04 MED ORDER — ALPRAZOLAM 0.5 MG PO TABS: 0.5000 mg | ORAL_TABLET | Freq: Three times a day (TID) | ORAL | 0 refills | Status: AC | PRN

## 2017-02-04 NOTE — Progress Notes (Signed)
Psychiatric Initial Adult Assessment   Patient Identification: Sheila Guerrero MRN:  [id number] Date of Evaluation:  02/04/2017 Referral Source: Family provider Chief Complaint:  anixety, panic Visit Diagnosis:    ICD-9-CM ICD-10-CM   1. Panic 300.01 F41.0 ALPRAZolam (XANAX) 0.5 MG tablet  2. Adjustment disorder with anxious mood 309.24 F43.22    History of Present Illness:  Patient presents today for psychiatric assessment, at the recommendation of her PCP.  She shares with Probation officer that she has been out of work for the past one month, due to severe resurgence of her symptoms of angioedema. She shows Probation officer several pictures of her angioedema episodes, and describes the significant discomfort and anxiety associated with the episodes, as her face and eyes swell, and then continue to train over the ensuing 2-3 days. She reports that the triggers are numerous, including environmental exposures, anxiety, medications, new foods, and new smells. She reports that she is not sure why her angioedema symptoms are flaring up recently, and she is working with her medical providers to remove possible contributors.  She feels that her mood has been severely negatively affected by her recent increase in symptoms. Previously, her angioedema symptoms were not so severe, so she was able to continue working whenever she would have flareups. Now the symptoms are much worse, and she has associated panic attacks, and she remains anxious much of the day, worrying about the prospects of a potential exposure. She reports that she tries to plan out much of her day, to avoid any possible new exposure, in fear that it may invoke a angioedema attack. She reports that she has had to leave the grocery store several times, pull over her car several times, and cannot even step foot into certain restaurants or buildings, for fear that this may invoke a panic attack. She describes some symptoms of a core phobia beginning to start, and  prefers to stay home much of the time.  She reports that she has severe panic attacks, about 5-6 times a week, and reports that prior to her angioedema flaring up she did not struggle with panic attacks to this degree. She reports that she cannot even go into her office (at home) without becoming shaky and nervous and sweating. She feels embarrassed by her inability to perform her work due to her medical and panic symptoms.  Spent time discussing the prospects of using Xanax when necessary, given that many SSRIs contribute to angioedema, as does Wellbutrin. We also discussed possible using clonazepam instead of Xanax, if the patient has any rebound anxiety. She agrees to continue in therapy twice weekly, which she is currently doing through her [employer] application on her phone.  She agrees to follow-up with this Probation officer in about a month for medication management, and to continue to discuss ideas about how best to proceed with reducing her anxiety and panic, such that she is able to get back to work.  Associated Signs/Symptoms: Depression Symptoms:  feelings of worthlessness/guilt, difficulty concentrating, anxiety, panic attacks, loss of energy/fatigue, (Hypo) Manic Symptoms:  none Anxiety Symptoms:  Agoraphobia, Excessive Worry, Panic Symptoms, Social Anxiety, Psychotic Symptoms:  none PTSD Symptoms: Negative  Past Psychiatric History: No significant past psychiatric history  Previous Psychotropic Medications: Yes   Substance Abuse History in the last 12 months:  No.  Consequences of Substance Abuse: Negative  Past Medical History:  Past Medical History:  Diagnosis Date  . Angioedema   . Anxiety   . Asthma   . Diabetes mellitus, type II ([place])   .  HTN (hypertension) 2013  . Pneumonia     Past Surgical History:  Procedure Laterality Date  . CHOLECYSTECTOMY      Family Psychiatric History: Family history of panic attacks and depression  Family History:  Family History   Problem Relation Age of Onset  . Hypertension Mother   . Cancer Mother   . Hypertension Father   . Stroke Father     Social History:   Social History   Social History  . Marital status: Married    Spouse name: N/A  . Number of children: N/A  . Years of education: N/A   Social History Main Topics  . Smoking status: Never Smoker  . Smokeless tobacco: Never Used  . Alcohol use Yes     Comment: twice a year  . Drug use: No  . Sexual activity: Yes    Birth control/ protection: None   Other Topics Concern  . None   Social History Narrative  . None    Additional Social History: Works at [employer], lives with her husband  Allergies:  No Known Allergies  Metabolic Disorder Labs: No results found for: HGBA1C, MPG No results found for: PROLACTIN No results found for: CHOL, TRIG, HDL, CHOLHDL, VLDL, LDLCALC   Current Medications: Current Outpatient Prescriptions  Medication Sig Dispense Refill  . ALPRAZolam (XANAX) 0.5 MG tablet Take 1 tablet (0.5 mg total) by mouth 3 (three) times daily as needed for anxiety or sleep. 90 tablet 0   No current facility-administered medications for this visit.    Neurologic: Headache: Negative Seizure: Negative Paresthesias:Negative  Musculoskeletal: Strength & Muscle Tone: within normal limits Gait & Station: normal Patient leans: N/A  Psychiatric Specialty Exam: Review of Systems  Constitutional: Negative.   HENT: Positive for congestion.        Angioedema - mild presently   Respiratory: Negative.   Cardiovascular: Negative.   Gastrointestinal: Negative.   Genitourinary: Negative.   Musculoskeletal: Negative.   Neurological: Negative.   Psychiatric/Behavioral: Negative for suicidal ideas. The patient is nervous/anxious.     Blood pressure (!) 148/90, pulse (!) 118.There is no height or weight on file to calculate BMI.  General Appearance: Casual and Well Groomed  Eye Contact:  Good  Speech:  Clear and Coherent   Volume:  Normal  Mood:  Anxious  Affect:  Appropriate  Thought Process:  Coherent  Orientation:  Full (Time, Place, and Person)  Thought Content:  Logical  Suicidal Thoughts:  No  Homicidal Thoughts:  No  Memory:  Immediate;   Good  Judgement:  Good  Insight:  Good  Psychomotor Activity:  Normal  Concentration:  Concentration: Fair and Attention Span: Fair  Recall:  NA  Fund of Knowledge:Good  Language: Good  Akathisia:  Negative  Handed:  Right  AIMS (if indicated):  n/a  Assets:  Communication Skills Desire for Improvement Financial Resources/Insurance Housing Intimacy Leisure Time Resilience Social Support Talents/Skills Transportation Vocational/Educational  ADL's:  Intact  Cognition: WNL  Sleep:  8 hours nightly    Treatment Plan Summary: Remee Charley is a 57 year old female, who has a Education officer, community working for [employer], and is currently unable to perform her work due to significant panic and anxiety associated with a resurgence of her angioedema symptoms. It is unclear what the trigger has been for this recent increase in her symptoms, but she is quite anxious about angioedema attacks, avoidant of potential triggers, and preoccupied with the embarrassment of dealing with these attacks during her work. She  presents with mood symptoms secondary to medical condition, and adjustment disorder with anxious features, in addition to panic attacks with some agoraphobia.  We are limited on what medications we can use, given the risk of angioedema with SSRI, but we'll proceed with alprazolam, so that we can provide the patient with relief, and potentially make it easier for her to face her anxieties and remaster them as she had before.  - Initiate Xanax 0.5 mg 3 times daily as needed for anxiety or panic - FMLA paperwork filled out today in office, given that her angioedema and significant anxiety are contributing to her inability to work at this time - Return  to clinic in 4 weeks, and patient is to continue in therapy twice weekly  Aundra Dubin, MD 3/9/20189:55 AM

## 2017-02-16 ENCOUNTER — Encounter: Payer: Self-pay | Admitting: Neurology

## 2017-02-16 ENCOUNTER — Ambulatory Visit (INDEPENDENT_AMBULATORY_CARE_PROVIDER_SITE_OTHER): Payer: 59 | Admitting: Neurology

## 2017-02-16 DIAGNOSIS — F4489 Other dissociative and conversion disorders: Secondary | ICD-10-CM | POA: Diagnosis not present

## 2017-02-16 DIAGNOSIS — T783XXS Angioneurotic edema, sequela: Secondary | ICD-10-CM

## 2017-02-16 DIAGNOSIS — T783XXA Angioneurotic edema, initial encounter: Secondary | ICD-10-CM | POA: Insufficient documentation

## 2017-02-16 DIAGNOSIS — D841 Defects in the complement system: Secondary | ICD-10-CM | POA: Insufficient documentation

## 2017-02-16 NOTE — Progress Notes (Signed)
PATIENT: Sheila Guerrero DOB: [birth year]  Chief Complaint  Patient presents with  . Episode of Confusion/Fatigue    Reports episodes of confusion, fatigue, "zoning-out" events and anxiety.  It is thought that her panic attacks are contributing to these events and/or medication side effects.  She is no longer taking any medications except Xanax.  This has caused a new set of problems, including extremely high blood pressure.  She also has diabetes.   Marland Kitchen PCP    Harlan Stains, MD     HISTORICAL  Sheila Guerrero is a 56 years old right-handed female, seen in refer by her primary care Dr. Harlan Stains for evaluation of episode of fatigue, zoon out, initial evaluation is February 16 2017    I reviewed and summarized the referring note, she had a history of anxiety, diabetes, hypertension, hyperlipidemia, asthma, recurrent angioedema, she had a family history of angioedema, her father had intermittent tongue swelling.  She reported lifelong history of recurrent angioedema, had significant asthma and allergies since early childhood, spent a lot of time in hospital in oxygen tent, around 15, she began to have recurrent angioedema initially involving her hands when she touch some triggering material, as years goes by, she began to have recurrent angioedema mainly involving her eyelids, lips, occasionally abdominal area. She has been seen and tested by different allergist in the past, and was treated with long-term of antihistamine, variable course of steroid without significant improvement.   She is also dealing with mild depression anxiety over the years, but getting worse with her recurrent angioedema, also increased her working responsibility and stress related to work  Since September 2017, she noticed increased difficulty handling her job, frequent panic attacks, which often trigger her angioedema, she has hypertension, diabetes, was treated with metoprolol, metformin, jardiance, because the  frequency of her recurrent angioedema, she end up stopping her medications in January 2018 with some improvement of her spells, but she has elevated blood pressure today is 190 over 100 heart rate 120  In addition she complains of transient memory lapse, zoning out episode, when she was looking at her email, suddenly her email would move to the distance, she has difficulty focusing, lasting for a few minutes, there was also episodes while she was driving in a familiar route, she could not figure out where she was, she has to stop and get her bearing together. There was no seizure activity noticed, she described difficulty focusing, difficulty handling her high demanding job, frequent panic attacks. But some of the described zoning out, transient confusion episode and not associated with clear panic attack.   She also described episode of pouring hot tea on her hand, forgot to turn off the stove.  Laboratory evaluation in February 2018, A1c 6.0, LDL 133, cholesterol 221, creatinine 0.69,  REVIEW OF SYSTEMS: Full 14 system review of systems performed and notable only for weight gain, fatigue, spinning sensation, rash, itching, blurred vision, shortness of breath, feeling hot, joint pain, achy muscles, skin sensitivity, frequent infection, memory loss, confusion, weakness, dizziness, passing out, sleepiness, depression, anxiety, too much sleep, racing thoughts.  ALLERGIES: Allergies  Allergen Reactions  . Latex Rash    HOME MEDICATIONS: Current Outpatient Prescriptions  Medication Sig Dispense Refill  . ALPRAZolam (XANAX) 0.5 MG tablet Take 1 tablet (0.5 mg total) by mouth 3 (three) times daily as needed for anxiety or sleep. 90 tablet 0   No current facility-administered medications for this visit.     PAST MEDICAL HISTORY: Past Medical  History:  Diagnosis Date  . Angioedema   . Anxiety   . Asthma   . Depression   . Diabetes mellitus, type II ([place])   . Dyslipidemia   . HTN  (hypertension) 2013  . Panic attacks   . Pneumonia   . Situational stress     PAST SURGICAL HISTORY: Past Surgical History:  Procedure Laterality Date  . CHOLECYSTECTOMY  2010    FAMILY HISTORY: Family History  Problem Relation Age of Onset  . Hypertension Mother   . Other Mother     blood disorder  . Hypertension Father   . Stroke Father   . Diabetes Maternal Aunt     SOCIAL HISTORY:  Social History   Social History  . Marital status: Married    Spouse name: N/A  . Number of children: 0  . Years of education: Bachelors   Occupational History  . Team Leader    Social History Main Topics  . Smoking status: Never Smoker  . Smokeless tobacco: Never Used  . Alcohol use Yes     Comment: twice a year  . Drug use: No  . Sexual activity: Yes    Birth control/ protection: None   Other Topics Concern  . Not on file   Social History Narrative   Lives at home with husband.   Right-handed.   Drinks 2-3 cups hot tea per day.     PHYSICAL EXAM   Vitals:   02/16/17 0854  BP: (!) 193/99  Pulse: (!) 120  Weight: 210 lb (95.3 kg)  Height: 5\' 5"  (1.651 m)    Not recorded      Body mass index is 34.95 kg/m.  PHYSICAL EXAMNIATION:  Gen: NAD, conversant, well nourised, obese, well groomed                     Cardiovascular: Regular rate rhythm, no peripheral edema, warm, nontender. Eyes: Conjunctivae clear without exudates or hemorrhage Neck: Supple, no carotid bruits. Pulmonary: Clear to auscultation bilaterally   NEUROLOGICAL EXAM:  MENTAL STATUS: Speech:    Speech is normal; fluent and spontaneous with normal comprehension.  Cognition:     Orientation to time, place and person     Normal recent and remote memory     Normal Attention span and concentration     Normal Language, naming, repeating,spontaneous speech     Fund of knowledge   CRANIAL NERVES: CN II: Visual fields are full to confrontation. Fundoscopic exam is normal with sharp discs and  no vascular changes. Pupils are round equal and briskly reactive to light. CN III, IV, VI: extraocular movement are normal. No ptosis. CN V: Facial sensation is intact to pinprick in all 3 divisions bilaterally. Corneal responses are intact.  CN VII: Face is symmetric with normal eye closure and smile. CN VIII: Hearing is normal to rubbing fingers CN IX, X: Palate elevates symmetrically. Phonation is normal. CN XI: Head turning and shoulder shrug are intact CN XII: Tongue is midline with normal movements and no atrophy.  MOTOR: There is no pronator drift of out-stretched arms. Muscle bulk and tone are normal. Muscle strength is normal.  REFLEXES: Reflexes are 2+ and symmetric at the biceps, triceps, knees, and ankles. Plantar responses are flexor.  SENSORY: Intact to light touch, pinprick, positional sensation and vibratory sensation are intact in fingers and toes.  COORDINATION: Rapid alternating movements and fine finger movements are intact. There is no dysmetria on finger-to-nose and heel-knee-shin.  GAIT/STANCE: Posture is normal. Gait is steady with normal steps, base, arm swing, and turning. Heel and toe walking are normal. Tandem gait is normal.  Romberg is absent.   DIAGNOSTIC DATA (LABS, IMAGING, TESTING) - I reviewed patient records, labs, notes, testing and imaging myself where available.   ASSESSMENT AND PLAN  Sheila Guerrero is a 57 y.o. female   Recurrent episode of transient confusion  Need to rule out central nervous system etiology, such as partial seizure  MRI of the brain without contrast, EEG Recurrent episode of angioedema  Laboratory evaluations,   Marcial Pacas, M.D. Ph.D.  [facility] [address] [place], [place] [address] Ph: [phone] Fax: [phone]  CC: Harlan Stains, MD

## 2017-02-18 ENCOUNTER — Telehealth: Payer: Self-pay | Admitting: Neurology

## 2017-02-18 ENCOUNTER — Encounter: Payer: Self-pay | Admitting: *Deleted

## 2017-02-18 LAB — C-REACTIVE PROTEIN: CRP: 3.5 mg/L (ref 0.0–4.9)

## 2017-02-18 LAB — IRON AND TIBC
Iron Saturation: 22 % (ref 15–55)
Iron: 66 ug/dL (ref 27–159)
Total Iron Binding Capacity: 304 ug/dL (ref 250–450)
UIBC: 238 ug/dL (ref 131–425)

## 2017-02-18 LAB — COMPREHENSIVE METABOLIC PANEL
ALT: 37 IU/L — ABNORMAL HIGH (ref 0–32)
AST: 25 IU/L (ref 0–40)
Albumin/Globulin Ratio: 1.5 (ref 1.2–2.2)
Albumin: 4.1 g/dL (ref 3.5–5.5)
Alkaline Phosphatase: 128 IU/L — ABNORMAL HIGH (ref 39–117)
BUN/Creatinine Ratio: 19 (ref 9–23)
BUN: 12 mg/dL (ref 6–24)
Bilirubin Total: 0.3 mg/dL (ref 0.0–1.2)
CO2: 23 mmol/L (ref 18–29)
Calcium: 9.3 mg/dL (ref 8.7–10.2)
Chloride: 101 mmol/L (ref 96–106)
Creatinine, Ser: 0.64 mg/dL (ref 0.57–1.00)
GFR calc Af Amer: 117 mL/min/{1.73_m2} (ref >59)
GFR calc non Af Amer: 101 mL/min/{1.73_m2} (ref >59)
Globulin, Total: 2.7 g/dL (ref 1.5–4.5)
Glucose: 399 mg/dL — ABNORMAL HIGH (ref 65–99)
Potassium: 4.3 mmol/L (ref 3.5–5.2)
Sodium: 139 mmol/L (ref 134–144)
Total Protein: 6.8 g/dL (ref 6.0–8.5)

## 2017-02-18 LAB — CBC WITH DIFFERENTIAL
Basophils Absolute: 0 10*3/uL (ref 0.0–0.2)
Basos: 1 %
EOS (ABSOLUTE): 0.1 10*3/uL (ref 0.0–0.4)
Eos: 1 %
Hematocrit: 48.2 % — ABNORMAL HIGH (ref 34.0–46.6)
Hemoglobin: 16 g/dL — ABNORMAL HIGH (ref 11.1–15.9)
Immature Grans (Abs): 0 10*3/uL (ref 0.0–0.1)
Immature Granulocytes: 0 %
Lymphocytes Absolute: 1.6 10*3/uL (ref 0.7–3.1)
Lymphs: 25 %
MCH: 29.2 pg (ref 26.6–33.0)
MCHC: 33.2 g/dL (ref 31.5–35.7)
MCV: 88 fL (ref 79–97)
Monocytes Absolute: 0.3 10*3/uL (ref 0.1–0.9)
Monocytes: 5 %
Neutrophils Absolute: 4.2 10*3/uL (ref 1.4–7.0)
Neutrophils: 68 %
RBC: 5.48 x10E6/uL — ABNORMAL HIGH (ref 3.77–5.28)
RDW: 13.8 % (ref 12.3–15.4)
WBC: 6.3 10*3/uL (ref 3.4–10.8)

## 2017-02-18 LAB — HGB A1C W/O EAG: Hgb A1c MFr Bld: 10 % — ABNORMAL HIGH (ref 4.8–5.6)

## 2017-02-18 LAB — SEDIMENTATION RATE: Sed Rate: 17 mm/hr (ref 0–40)

## 2017-02-18 LAB — ANA W/REFLEX: Anti Nuclear Antibody(ANA): NEGATIVE

## 2017-02-18 LAB — FERRITIN: Ferritin: 171 ng/mL — ABNORMAL HIGH (ref 15–150)

## 2017-02-18 LAB — VITAMIN B12: Vitamin B-12: 849 pg/mL (ref 232–1245)

## 2017-02-18 LAB — COMPLEMENT, TOTAL: Compl, Total (CH50): >60 U/mL — ABNORMAL HIGH (ref 42–60)

## 2017-02-18 LAB — VITAMIN D 25 HYDROXY (VIT D DEFICIENCY, FRACTURES): Vit D, 25-Hydroxy: 25.5 ng/mL — ABNORMAL LOW (ref 30.0–100.0)

## 2017-02-18 LAB — COPPER, SERUM: Copper: 98 ug/dL (ref 72–166)

## 2017-02-18 NOTE — Telephone Encounter (Signed)
Please call patient, laboratory evaluation showed significant elevated A1c 10, glucose level was 399, elevated hemoglobin, which was mildly elevated in the previous test as well,  Mildly decreased vitamin D 25, she should take vitamin D3 supplements 1000 units daily  Mild elevated abnormal liver functional tests, alkaline phosphatase level, may consider repeat test later  Mild elevated ferritin (which is an indicator of iron storage in our body )can be associated with inflammatory process,  Normal complement level,

## 2017-02-18 NOTE — Telephone Encounter (Signed)
She is aware of results.  She has vitamin D 5000 units at home and will take this supplement (ok by Dr. Krista Blue).

## 2017-02-23 ENCOUNTER — Ambulatory Visit (INDEPENDENT_AMBULATORY_CARE_PROVIDER_SITE_OTHER): Payer: 59 | Admitting: Neurology

## 2017-02-23 DIAGNOSIS — R410 Disorientation, unspecified: Secondary | ICD-10-CM | POA: Diagnosis not present

## 2017-02-23 DIAGNOSIS — T783XXS Angioneurotic edema, sequela: Secondary | ICD-10-CM

## 2017-02-23 DIAGNOSIS — F4489 Other dissociative and conversion disorders: Secondary | ICD-10-CM

## 2017-02-25 NOTE — Procedures (Addendum)
   HISTORY: 56 years old female, with episode of fatigue, confusion.  TECHNIQUE:  16 channel EEG was performed based on standard 10-16 international system. One channel was dedicated to EKG, which has demonstrates normal sinus rhythm of 108 beats per minutes.  Upon awakening, the posterior background activity was well-developed, in alpha range, 10 Hz, reactive to eye opening and closure.  There was no evidence of epileptiform discharge. There was frequent electrode artifact at F4, Cz.  Photic stimulation was performed, which induced a symmetric photic driving.  Hyperventilation was performed, there was no abnormality elicit.  No sleep was achieved.  CONCLUSION: This is a  normal awake EEG.  There is no electrodiagnostic evidence of epileptiform discharge.  Marcial Pacas, M.D. Ph.D.  [facility] [place], [place] [address] Phone: [phone] Fax:      [phone]

## 2017-03-02 ENCOUNTER — Telehealth: Payer: Self-pay | Admitting: Neurology

## 2017-03-02 ENCOUNTER — Other Ambulatory Visit: Payer: Self-pay | Admitting: *Deleted

## 2017-03-02 ENCOUNTER — Encounter (INDEPENDENT_AMBULATORY_CARE_PROVIDER_SITE_OTHER): Payer: Self-pay

## 2017-03-02 ENCOUNTER — Ambulatory Visit (INDEPENDENT_AMBULATORY_CARE_PROVIDER_SITE_OTHER): Payer: 59

## 2017-03-02 DIAGNOSIS — T783XXS Angioneurotic edema, sequela: Secondary | ICD-10-CM

## 2017-03-02 DIAGNOSIS — F4489 Other dissociative and conversion disorders: Secondary | ICD-10-CM | POA: Diagnosis not present

## 2017-03-02 NOTE — Telephone Encounter (Signed)
Pt called back, msg relayed. She understood and will call PCP

## 2017-03-02 NOTE — Telephone Encounter (Signed)
Spoke to Dr. Krista Blue - this request will need to be addressed by her PCP.  Attempted to reach patient by phone - no answer and unable to leave a message.

## 2017-03-02 NOTE — Telephone Encounter (Signed)
Hey Dr. Krista Blue. Pt was here today for a MRI, and wanted me to ask you if there's anyway she could have a PKU test done. She says she ingests a lot of aspartame.

## 2017-03-04 ENCOUNTER — Ambulatory Visit (INDEPENDENT_AMBULATORY_CARE_PROVIDER_SITE_OTHER): Payer: 59 | Admitting: Psychiatry

## 2017-03-04 ENCOUNTER — Telehealth: Payer: Self-pay | Admitting: Neurology

## 2017-03-04 ENCOUNTER — Encounter (HOSPITAL_COMMUNITY): Payer: Self-pay | Admitting: Psychiatry

## 2017-03-04 VITALS — BP 126/74 | HR 100 | Ht 65.0 in | Wt 202.2 lb

## 2017-03-04 DIAGNOSIS — F4322 Adjustment disorder with anxiety: Secondary | ICD-10-CM | POA: Diagnosis not present

## 2017-03-04 DIAGNOSIS — Z79899 Other long term (current) drug therapy: Secondary | ICD-10-CM

## 2017-03-04 DIAGNOSIS — F410 Panic disorder [episodic paroxysmal anxiety] without agoraphobia: Secondary | ICD-10-CM | POA: Diagnosis not present

## 2017-03-04 NOTE — Progress Notes (Signed)
BH MD/PA/NP OP Progress Note  03/04/2017 11:01 AM Sheila Guerrero  MRN:  [id number]  Chief Complaint:  Subjective:  Sheila Guerrero presents today for medication management follow-up. She has used Xanax about 5-10 times over the past month. She reports that it was effective in helping with her sense of panic and anxiety. She did not have any significant angioedema or negative response to the medication. 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 Her division is being consolidated. She is seriously considering taking this severance package. And taking retirement.  We spent time discussing what this could mean for her in terms of her sense of self worth, and how she would fill her time otherwise. She continues to present with some depressive symptoms including anhedonia, low energy, difficulty concentrating. We discussed the prospects of antidepressant treatment, but given her past failures with antidepressants, and the significant risk of angioedema, she has apprehensions about this. She is considering individual or group therapy.  I spent time discussing the risks and benefits of [place], as a therapy that may be helpful for her depression. We reviewed some of the risks and benefits, and I provided a packet of information for her to review as well. She will let writer know if this is something she is ingested in.  Visit Diagnosis: No diagnosis found.  Past Psychiatric History: See intake H&P for full details. Reviewed, with no updates at this time.   Past Medical History:  Past Medical History:  Diagnosis Date  . Angioedema   . Anxiety   . Asthma   . Depression   . Diabetes mellitus, type II (Early)   . Dyslipidemia   . HTN (hypertension) 2013  . Panic attacks   . Pneumonia   . Situational stress     Past Surgical History:  Procedure Laterality Date  . CHOLECYSTECTOMY  2010     Family Psychiatric History: See intake H&P for full details. Reviewed, with no updates at this time.   Family History:  Family History  Problem Relation Age of Onset  . Hypertension Mother   . Other Mother     blood disorder  . Hypertension Father   . Stroke Father   . Diabetes Maternal Aunt     Social History:  Social History   Social History  . Marital status: Married    Spouse name: N/A  . Number of children: 0  . Years of education: Bachelors   Occupational History  . Team Leader    Social History Main Topics  . Smoking status: Never Smoker  . Smokeless tobacco: Never Used  . Alcohol use Yes     Comment: twice a year  . Drug use: No  . Sexual activity: Yes    Birth control/ protection: None   Other Topics Concern  . None   Social History Narrative   Lives at home with husband.   Right-handed.   Drinks 2-3 cups hot tea per day.    Allergies:  Allergies  Allergen Reactions  . Latex Rash    Metabolic Disorder Labs: Lab Results  Component Value Date   HGBA1C 10.0 (H) 02/16/2017   No results found for: PROLACTIN No results found for: CHOL, TRIG, HDL, CHOLHDL, VLDL, LDLCALC   Current Medications: Current Outpatient Prescriptions  Medication Sig Dispense Refill  . ALPRAZolam (XANAX) 0.5 MG tablet Take 1 tablet (0.5 mg total) by mouth 3 (three) times daily  as needed for anxiety or sleep. 90 tablet 0  . Cholecalciferol (VITAMIN D PO) Take 5,000 Units by mouth daily.    . TOPROL XL 100 MG 24 hr tablet      No current facility-administered medications for this visit.     Neurologic: Headache: Negative Seizure: Negative Paresthesias: Negative  Musculoskeletal: Strength & Muscle Tone: within normal limits Gait & Station: normal Patient leans: N/A  Psychiatric Specialty Exam: Review of Systems  Psychiatric/Behavioral: Positive for depression.    Blood pressure 126/74, pulse 100, height 5\' 5"  (1.651 m), weight 202 lb 3.2 oz (91.7 kg).Body  mass index is 33.65 kg/m.  General Appearance: Casual  Eye Contact:  Good  Speech:  Clear and Coherent  Volume:  Normal  Mood:  Dysphoric  Affect:  Appropriate  Thought Process:  Coherent  Orientation:  Full (Time, Place, and Person)  Thought Content: Logical   Suicidal Thoughts:  No  Homicidal Thoughts:  No  Memory:  Immediate;   Good  Judgement:  Good  Insight:  Good  Psychomotor Activity:  Normal  Concentration:  Concentration: Good  Recall:  NA  Fund of Knowledge: Good  Language: Good  Akathisia:  Negative  Handed:  Right  AIMS (if indicated):  na  Assets:  Communication Skills Desire for Improvement Financial Resources/Insurance Housing Intimacy Leisure Time Resilience Social Support Talents/Skills Transportation  ADL's:  Intact  Cognition: WNL  Sleep:  8-10 hours, excessive sleep    Treatment Plan Summary: Sheila Guerrero is a 56 year old female, with a lengthy history of corporate professional work, who is recently struggled with increased panic episodes in the setting of increased angioedema. She presented for anxiety and mood management, and has had a fairly good response to the when necessary use of alprazolam. She has some upcoming stressors, specifically that she has been offered a Customer service manager with her job, and will need to make a decision of whether or not she will functionally move towards retirement. The patient has a long history of self-sufficiency, hard work, and generally feeling very useful in her professional life. I suspect this will be a difficult transition for her, and she may benefit from therapy or group therapies as she comes to decision. Given her history of angioedema, and the history of treatment resistant depression, she may be a good candidate for [place].  1. Adjustment disorder with anxious mood   2. Panic    Continue alprazolam 0.5 mg up to 3 times daily as needed Consider [place] for major depressive disorder Consider therapies,  both individual and group Follow-up in 1-2 months  Aundra Dubin, MD 03/04/2017, 11:01 AM

## 2017-03-04 NOTE — Telephone Encounter (Signed)
Please give her a follow up visit to review MRI brain films

## 2017-03-07 ENCOUNTER — Encounter (HOSPITAL_COMMUNITY): Payer: Self-pay | Admitting: Psychiatry

## 2017-03-07 NOTE — Telephone Encounter (Signed)
Spoke to patient - her follow up has been scheduled.

## 2017-03-14 ENCOUNTER — Encounter (HOSPITAL_COMMUNITY): Payer: Self-pay | Admitting: Psychiatry

## 2017-03-17 ENCOUNTER — Encounter: Payer: Self-pay | Admitting: Neurology

## 2017-03-17 ENCOUNTER — Other Ambulatory Visit: Payer: Self-pay | Admitting: *Deleted

## 2017-03-17 ENCOUNTER — Telehealth: Payer: Self-pay | Admitting: Neurology

## 2017-03-17 ENCOUNTER — Ambulatory Visit (INDEPENDENT_AMBULATORY_CARE_PROVIDER_SITE_OTHER): Payer: 59 | Admitting: Neurology

## 2017-03-17 VITALS — BP 146/83 | HR 92 | Ht 65.0 in | Wt 204.0 lb

## 2017-03-17 DIAGNOSIS — I639 Cerebral infarction, unspecified: Secondary | ICD-10-CM | POA: Diagnosis not present

## 2017-03-17 DIAGNOSIS — E1149 Type 2 diabetes mellitus with other diabetic neurological complication: Secondary | ICD-10-CM | POA: Diagnosis not present

## 2017-03-17 DIAGNOSIS — E1140 Type 2 diabetes mellitus with diabetic neuropathy, unspecified: Secondary | ICD-10-CM

## 2017-03-17 DIAGNOSIS — E119 Type 2 diabetes mellitus without complications: Secondary | ICD-10-CM | POA: Insufficient documentation

## 2017-03-17 DIAGNOSIS — E785 Hyperlipidemia, unspecified: Secondary | ICD-10-CM | POA: Insufficient documentation

## 2017-03-17 DIAGNOSIS — E1169 Type 2 diabetes mellitus with other specified complication: Secondary | ICD-10-CM | POA: Insufficient documentation

## 2017-03-17 NOTE — Telephone Encounter (Signed)
Sharyn Lull , Will you reorder for Dr. Krista Blue Please needs new code for [facility]. [id number] . Carotid Doppler.

## 2017-03-17 NOTE — Progress Notes (Signed)
PATIENT: Sheila Guerrero DOB: Jul 09, 1961  Chief Complaint  Patient presents with  . Confusion State    "Sheila Guerrero" would like to review her MRI and EEG results.     HISTORICAL  Sheila Guerrero is a 56 years old right-handed female, seen in refer by her primary care Dr. Harlan Stains for evaluation of episode of fatigue, zone out, initial evaluation is February 16 2017    I reviewed and summarized the referring note, she had a history of anxiety, diabetes, hypertension, hyperlipidemia, asthma, recurrent angioedema, she had a family history of angioedema, her father had intermittent tongue swelling.  She reported lifelong history of recurrent angioedema, had significant asthma and allergies since early childhood, spent a lot of time in hospital in oxygen tent, around 15, she began to have recurrent angioedema initially involving her hands when she touch some triggering material, as years goes by, she began to have recurrent angioedema mainly involving her eyelids, lips, occasionally abdominal area. She has been seen and tested by different allergist in the past, and was treated with long-term of antihistamine, variable course of steroid without significant improvement.   She is also dealing with mild depression anxiety over the years, but getting worse with her recurrent angioedema, also increased her working responsibility and stress related to work  Since September 2017, she noticed increased difficulty handling her job, frequent panic attacks, which often trigger her angioedema, she has hypertension, diabetes, was treated with metoprolol, metformin, jardiance, because the frequency of her recurrent angioedema, she end up stopping her medications in January 2018 with some improvement of her spells, but she has elevated blood pressure today is 190 over 100 heart rate 120  In addition she complains of transient memory lapse, zoning out episode, when she was looking at her email, suddenly her  email would move to the distance, she has difficulty focusing, lasting for a few minutes, there was also episodes while she was driving in a familiar route, she could not figure out where she was, she has to stop and get her bearing together. There was no seizure activity noticed, she described difficulty focusing, difficulty handling her high demanding job, frequent panic attacks. But some of the described zoning out, transient confusion episode and not associated with clear panic attack.   She also described episode of pouring hot tea on her hand, forgot to turn off the stove.  Laboratory evaluation in February 2018, A1c 6.0, LDL 133, cholesterol 221, creatinine 0.69,  UPDATE March 17 2017: Over the past few weeks, overall she felt much better,  I have personally reviewed MRI of the brain in March 2018, there was evidence of supratentorium small vessel disease, stroke involving left frontal subcortical region, microhemorrhage at right thalamus, right cerebellum,  We also reviewed laboratory evaluations, A1c was 10, mildly low vitamin D 25, otherwise no significant abnormality on iron panel, CBC, CMP, normal ESR, C-reactive protein, ANA,  She is now back on Jardiance  25 mg daily, her glucose level is under better control, also taking Toradol XL 100 mg, her blood pressure is better now,  EEG was normal in March 2018  REVIEW OF SYSTEMS: Full 14 system review of systems performed and notable only for  Environmental allergy, light sensitivity, wheezing, agitation, poor concentration  ALLERGIES: Allergies  Allergen Reactions  . Latex Rash    HOME MEDICATIONS: Current Outpatient Prescriptions  Medication Sig Dispense Refill  . ALPRAZolam (XANAX) 0.5 MG tablet Take 1 tablet (0.5 mg total) by mouth 3 (three) times  daily as needed for anxiety or sleep. 90 tablet 0  . Cholecalciferol (VITAMIN D PO) Take 5,000 Units by mouth daily.    Marland Kitchen JARDIANCE 25 MG TABS tablet Take 25 mg by mouth daily.      . TOPROL XL 100 MG 24 hr tablet      No current facility-administered medications for this visit.     PAST MEDICAL HISTORY: Past Medical History:  Diagnosis Date  . Angioedema   . Anxiety   . Asthma   . Depression   . Diabetes mellitus, type II (HCC)   . Dyslipidemia   . HTN (hypertension) 2013  . Panic attacks   . Pneumonia   . Situational stress     PAST SURGICAL HISTORY: Past Surgical History:  Procedure Laterality Date  . CHOLECYSTECTOMY  2010    FAMILY HISTORY: Family History  Problem Relation Age of Onset  . Hypertension Mother   . Other Mother     blood disorder  . Hypertension Father   . Stroke Father   . Diabetes Maternal Aunt     SOCIAL HISTORY:  Social History   Social History  . Marital status: Married    Spouse name: N/A  . Number of children: 0  . Years of education: Bachelors   Occupational History  . Team Leader    Social History Main Topics  . Smoking status: Never Smoker  . Smokeless tobacco: Never Used  . Alcohol use Yes     Comment: twice a year  . Drug use: No  . Sexual activity: Yes    Birth control/ protection: None   Other Topics Concern  . Not on file   Social History Narrative   Lives at home with husband.   Right-handed.   Drinks 2-3 cups hot tea per day.     PHYSICAL EXAM   Vitals:   03/17/17 0836  BP: (!) 146/83  Pulse: 92  Weight: 204 lb (92.5 kg)  Height: 5\' 5"  (1.651 m)    Not recorded      Body mass index is 33.95 kg/m.  PHYSICAL EXAMNIATION:  Gen: NAD, conversant, well nourised, obese, well groomed                     Cardiovascular: Regular rate rhythm, no peripheral edema, warm, nontender. Eyes: Conjunctivae clear without exudates or hemorrhage Neck: Supple, no carotid bruits. Pulmonary: Clear to auscultation bilaterally   NEUROLOGICAL EXAM:  MENTAL STATUS: Speech:    Speech is normal; fluent and spontaneous with normal comprehension.  Cognition:     Orientation to time, place and  person     Normal recent and remote memory     Normal Attention span and concentration     Normal Language, naming, repeating,spontaneous speech     Fund of knowledge   CRANIAL NERVES: CN II: Visual fields are full to confrontation. Fundoscopic exam is normal with sharp discs and no vascular changes. Pupils are round equal and briskly reactive to light. CN III, IV, VI: extraocular movement are normal. No ptosis. CN V: Facial sensation is intact to pinprick in all 3 divisions bilaterally. Corneal responses are intact.  CN VII: Face is symmetric with normal eye closure and smile. CN VIII: Hearing is normal to rubbing fingers CN IX, X: Palate elevates symmetrically. Phonation is normal. CN XI: Head turning and shoulder shrug are intact CN XII: Tongue is midline with normal movements and no atrophy.  MOTOR: There is no pronator drift of out-stretched  arms. Muscle bulk and tone are normal. Muscle strength is normal.  REFLEXES: Reflexes are 2+ and symmetric at the biceps, triceps, knees, and ankles. Plantar responses are flexor.  SENSORY: Intact to light touch, pinprick, positional sensation and vibratory sensation are intact in fingers and toes.  COORDINATION: Rapid alternating movements and fine finger movements are intact. There is no dysmetria on finger-to-nose and heel-knee-shin.    GAIT/STANCE: Posture is normal. Gait is steady with normal steps, base, arm swing, and turning. Heel and toe walking are normal. Tandem gait is normal.  Romberg is absent.   DIAGNOSTIC DATA (LABS, IMAGING, TESTING) - I reviewed patient records, labs, notes, testing and imaging myself where available.   ASSESSMENT AND PLAN  Tyniesha Howald is a 56 y.o. female   Evidence of left frontal subcortical area stroke  Most consistent with small vessel disease  Complete evaluation with echocardiogram, ultrasound of carotid artery  Add on aspirin daily  I have emphasized with her to importance of  control vascular risk factors, diabetes, hypertension, moderate exercise, keep well hydration.    Marcial Pacas, M.D. Ph.D.  [facility] [address] [place], [place] [address] Ph: [phone] Fax: [phone]  CC: Harlan Stains, MD

## 2017-03-18 ENCOUNTER — Encounter: Payer: Self-pay | Admitting: Neurology

## 2017-03-21 ENCOUNTER — Encounter (HOSPITAL_COMMUNITY): Payer: Self-pay | Admitting: Psychiatry

## 2017-03-22 NOTE — Telephone Encounter (Signed)
Sheila Guerrero has order and they will call patient to schedule. [phone] .

## 2017-03-23 ENCOUNTER — Telehealth: Payer: Self-pay | Admitting: Neurology

## 2017-03-23 ENCOUNTER — Other Ambulatory Visit: Payer: Self-pay | Admitting: *Deleted

## 2017-03-23 DIAGNOSIS — I639 Cerebral infarction, unspecified: Secondary | ICD-10-CM

## 2017-03-23 NOTE — Telephone Encounter (Signed)
Left message on the voicemail of Dr. Zebedee Iba.  Requesting a return call and provided him Dr. Rhea Belton mobile number.

## 2017-03-23 NOTE — Telephone Encounter (Signed)
Pt calling asking how to go about After care as a result of having a stroke.  Please call her mobile

## 2017-03-23 NOTE — Telephone Encounter (Signed)
Spoke to patient - she is concerned about the following symptoms: inability to engage in complex conversation, decreased cognitive function, increased anxiety, easily frustrated.  She would like to discuss the next step for her post-stroke psychological health.

## 2017-03-23 NOTE — Telephone Encounter (Signed)
Dr Valora Corporal [phone] called request P2P review to discuss pt's disability claim

## 2017-03-23 NOTE — Telephone Encounter (Signed)
Please help me connect

## 2017-03-23 NOTE — Telephone Encounter (Signed)
Per Dr. Krista Blue, refer for neuropsychiatric evaluation.  Order placed in Epic and patient aware to expect a call for scheduling.

## 2017-03-24 NOTE — Telephone Encounter (Signed)
I was able to talk with Dr. Zebedee Iba about her recent office visit, medical condition, MRI of the brain showed evidence of small vessel disease, left frontal subcortical stroke, I have initiated stroke evaluation, echocardiogram, ultrasound of carotid artery, she does complains of difficulty focusing, difficulty with multitasking, I have recommended neuropsychiatric evaluation,  Most recent A1c was 10, she is working with her primary care physician to better address vascular risk factor.

## 2017-03-25 ENCOUNTER — Telehealth: Payer: Self-pay | Admitting: Neurology

## 2017-03-25 NOTE — Telephone Encounter (Signed)
I called [phone] with [facility], Melanie, ECHO needed pre-certification, is scheduled on March 28 2017.

## 2017-03-25 NOTE — Telephone Encounter (Signed)
I have the [place] for the echo it is [id number] (exp. 03/25/17 to 05/09/17) I triad to call [facility] to give them the information no one picked up so I left a detail message with the auth number in the message.

## 2017-03-28 ENCOUNTER — Telehealth: Payer: Self-pay | Admitting: Neurology

## 2017-03-28 ENCOUNTER — Ambulatory Visit (HOSPITAL_BASED_OUTPATIENT_CLINIC_OR_DEPARTMENT_OTHER)
Admission: RE | Admit: 2017-03-28 | Discharge: 2017-03-28 | Disposition: A | Discharge status: Home / Self Care | Payer: 59 | Source: Ambulatory Visit | Attending: Neurology | Admitting: Neurology

## 2017-03-28 ENCOUNTER — Telehealth (HOSPITAL_COMMUNITY): Payer: Self-pay

## 2017-03-28 ENCOUNTER — Ambulatory Visit (HOSPITAL_COMMUNITY)
Admission: RE | Admit: 2017-03-28 | Discharge: 2017-03-28 | Disposition: A | Discharge status: Home / Self Care | Payer: 59 | Source: Ambulatory Visit | Attending: Neurology | Admitting: Neurology

## 2017-03-28 DIAGNOSIS — E1140 Type 2 diabetes mellitus with diabetic neuropathy, unspecified: Secondary | ICD-10-CM

## 2017-03-28 DIAGNOSIS — I6523 Occlusion and stenosis of bilateral carotid arteries: Secondary | ICD-10-CM | POA: Diagnosis not present

## 2017-03-28 DIAGNOSIS — E1149 Type 2 diabetes mellitus with other diabetic neurological complication: Secondary | ICD-10-CM | POA: Insufficient documentation

## 2017-03-28 DIAGNOSIS — I639 Cerebral infarction, unspecified: Secondary | ICD-10-CM | POA: Insufficient documentation

## 2017-03-28 LAB — VAS US CAROTID
LEFT ECA DIAS: -16 cm/s
LEFT VERTEBRAL DIAS: -16 cm/s
Left CCA dist dias: -21 cm/s
Left CCA dist sys: -90 cm/s
Left CCA prox dias: 16 cm/s
Left CCA prox sys: 98 cm/s
Left ICA dist dias: -24 cm/s
Left ICA dist sys: -68 cm/s
Left ICA prox dias: -24 cm/s
Left ICA prox sys: -65 cm/s
RIGHT ECA DIAS: -11 cm/s
RIGHT VERTEBRAL DIAS: -12 cm/s
Right CCA prox dias: 20 cm/s
Right CCA prox sys: 90 cm/s
Right cca dist sys: -103 cm/s

## 2017-03-28 NOTE — Progress Notes (Signed)
VASCULAR LAB PRELIMINARY  PRELIMINARY  PRELIMINARY  PRELIMINARY  Carotid duplex completed.    Preliminary report:  1-39% ICA stenosis.  Vertebral artery flow is antegrade.   KANADY, CANDACE, RVT 03/28/2017, 2:33 PM

## 2017-03-28 NOTE — Telephone Encounter (Signed)
I have talked with Dr. Donnalee Curry from her insurance company for her short term disability,   We discussed about her recent office visit, medical condition, MRI of the brain on February 23 2017 showed evidence of small vessel disease, left frontal subcortical stroke, I have initiated stroke evaluation, echocardiogram, ultrasound of carotid artery, she does complains of difficulty focusing, difficulty with multitasking, I have recommended neuropsychiatric evaluation,  Most recent A1c was 10, she is working with her primary care physician to better address vascular risk factor.

## 2017-03-28 NOTE — Telephone Encounter (Signed)
This doctor is calling to discuss patients disability status. Dr. Donnalee Curry - Neurologist. [phone]

## 2017-03-28 NOTE — Telephone Encounter (Signed)
Spoke to patient - she is leaving her job on 04/15/17 and will have to pick new insurance.  She will not be scheduling her neuropsychiatric testing until after her new insurance is in place.  She will call our office to schedule a follow up, once the appt has been made for testing.

## 2017-03-29 NOTE — Telephone Encounter (Signed)
Thank you I talked with him this morning. All set

## 2017-03-30 ENCOUNTER — Telehealth: Payer: Self-pay | Admitting: Neurology

## 2017-03-30 NOTE — Telephone Encounter (Signed)
Please call patient, [place] of carotid artery showed: Mild bilateral internal carotid artery stenosis 1-39%, but there was evidence of soft plaque noted at the right internal carotid artery, mixed plaque at left internal carotid artery  Right: significant soft plaque noted distal CCA into the origin of the ICA. Left: mild to moderate soft plaque distal CCA into the origin of the ICA. Mild mixed plaque proximal ICA and ECA. Bilateral: 1-39% ICA stenosis. Vertebral artery flow is antegrade.  She should continue her aspirin daily, optimized hypertension, diabetic control to address vascular risk factor, moderate exercise, keep well hydration.

## 2017-03-31 NOTE — Telephone Encounter (Signed)
Spoke to patient - she is aware of results and Dr. Rhea Belton recommendations.

## 2017-06-09 ENCOUNTER — Telehealth: Payer: Self-pay | Admitting: Neurology

## 2017-06-09 NOTE — Telephone Encounter (Signed)
Patient refused appointment.

## 2017-06-09 NOTE — Telephone Encounter (Signed)
Pt calling back to get scheduled for what was told would be a 6 part series re: measuring of her cognitive skills as a result of her strokes, please call.

## 2017-06-10 NOTE — Telephone Encounter (Signed)
Patient called back and relayed to her that I was sending to Dr. Norton Pastel in [facility] . Relayed to Patient Dr. Bonita Quin - [place] Ree   first available  apt was October. Patient agreered to go to [facility]. Gave Patient telephone and address. Patient relayed she understood. Referral faxed telephone [phone] - fax [phone].

## 2017-06-10 NOTE — Telephone Encounter (Signed)
Called Patient x 2 to see if she wanted me to resend Neuro Kandis Nab, PsyD Calling [place] to schedule patient .

## 2017-07-25 ENCOUNTER — Ambulatory Visit (INDEPENDENT_AMBULATORY_CARE_PROVIDER_SITE_OTHER): Payer: 59 | Admitting: Endocrinology

## 2017-07-25 ENCOUNTER — Encounter: Payer: Self-pay | Admitting: Endocrinology

## 2017-07-25 VITALS — BP 148/80 | HR 100 | Ht 65.0 in | Wt 210.0 lb

## 2017-07-25 DIAGNOSIS — E1149 Type 2 diabetes mellitus with other diabetic neurological complication: Secondary | ICD-10-CM | POA: Diagnosis not present

## 2017-07-25 LAB — POCT GLYCOSYLATED HEMOGLOBIN (HGB A1C): Hemoglobin A1C: 9.3

## 2017-07-25 MED ORDER — REPAGLINIDE 1 MG PO TABS: 1.0000 mg | ORAL_TABLET | Freq: Three times a day (TID) | ORAL | 11 refills | Status: DC

## 2017-07-25 NOTE — Patient Instructions (Addendum)
good diet and exercise significantly improve the control of your diabetes.  please let me know if you wish to be referred to a dietician.  high blood sugar is very risky to your health.  you should see an eye doctor and dentist every year.  It is very important to get all recommended vaccinations.  Controlling your blood pressure and cholesterol drastically reduces the damage diabetes does to your body.  Those who smoke should quit.  Please discuss these with your doctor.  check your blood sugar once a day.  vary the time of day when you check, between before the 3 meals, and at bedtime.  also check if you have symptoms of your blood sugar being too high or too low.  please keep a record of the readings and bring it to your next appointment here (or you can bring the meter itself).  You can write it on any piece of paper.  please call us sooner if your blood sugar goes below 70, or if you have a lot of readings over 200.  Please continue the same tradjenta, and: I have sent a prescription to your pharmacy, to add "repaglinide."   Please come back for a follow-up appointment in 2 months.

## 2017-07-25 NOTE — Progress Notes (Signed)
Subjective:    Patient ID: Sheila Guerrero, female    DOB: Nov 11, 1961, 56 y.o.   MRN: [id number]  HPI pt is referred by Dr Dema Severin, for diabetes.  Pt states DM was dx'ed in 2017; she has mild if any neuropathy of the lower extremities, but she has associated CVA; she has never been on insulin; pt says her diet and exercise are good; he has never had GDM, pancreatitis, pancreatic surgery, severe hypoglycemia or DKA. She did not tolerate jardiance, trulicity, or metformin (angioedema).  She takes tradjenta only. She says cbg's are in the 100's fasting.   Past Medical History:  Diagnosis Date  . Angioedema   . Anxiety   . Asthma   . Depression   . Diabetes mellitus, type II ([place])   . Dyslipidemia   . HTN (hypertension) 2013  . Panic attacks   . Pneumonia   . Situational stress     Past Surgical History:  Procedure Laterality Date  . CHOLECYSTECTOMY  2010    Social History   Social History  . Marital status: Married    Spouse name: N/A  . Number of children: 0  . Years of education: Bachelors   Occupational History  . Team Leader    Social History Main Topics  . Smoking status: Never Smoker  . Smokeless tobacco: Never Used  . Alcohol use Yes     Comment: twice a year  . Drug use: No  . Sexual activity: Yes    Birth control/ protection: None   Other Topics Concern  . Not on file   Social History Narrative   Lives at home with husband.   Right-handed.   Drinks 2-3 cups hot tea per day.    Current Outpatient Prescriptions on File Prior to Visit  Medication Sig Dispense Refill  . ALPRAZolam (XANAX) 0.5 MG tablet Take 1 tablet (0.5 mg total) by mouth 3 (three) times daily as needed for anxiety or sleep. 90 tablet 0  . Cholecalciferol (VITAMIN D PO) Take 5,000 Units by mouth daily.    . TOPROL XL 100 MG 24 hr tablet      No current facility-administered medications on file prior to visit.     Allergies  Allergen Reactions  . Latex Rash    Family History    Problem Relation Age of Onset  . Hypertension Mother   . Other Mother        blood disorder  . Hypertension Father   . Stroke Father   . Diabetes Maternal Aunt     BP (!) 148/80   Pulse 100   Ht 5\' 5"  (1.651 m)   Wt 210 lb (95.3 kg)   SpO2 97%   BMI 34.95 kg/m    Review of Systems denies blurry vision, headache, chest pain, n/v, urinary frequency, muscle cramps, excessive diaphoresis, depression, cold intolerance, rhinorrhea, and easy bruising.  She has insomnia, weight gain, intermitt wheezing, and fatigue.       Objective:   Physical Exam VS: see vs page GEN: no distress HEAD: head: no deformity eyes: no periorbital swelling, no proptosis.   external nose and ears are normal.  mouth: no lesion seen NECK: supple, thyroid is not enlarged CHEST WALL: no deformity LUNGS: clear to auscultation CV: reg rate and rhythm, no murmur ABD: abdomen is soft, nontender.  no hepatosplenomegaly.  not distended.  no hernia.  MUSCULOSKELETAL: muscle bulk and strength are grossly normal.  no obvious joint swelling.  gait is normal and steady.  EXTEMITIES: no deformity.  no ulcer on the feet.  feet are of normal color and temp.  no edema PULSES: dorsalis pedis intact bilat.  no carotid bruit NEURO:  cn 2-12 grossly intact.   readily moves all 4's.  sensation is intact to touch on the feet SKIN:  Normal texture and temperature.  No rash or suspicious lesion is visible.   NODES:  None palpable at the neck.  PSYCH: alert, well-oriented.  Does not appear anxious nor depressed.   I personally reviewed electrocardiogram tracing (12/04/11): Indication: cough Impression: ST  No MI.  No hypertrophy. No comparison is available  Lab Results  Component Value Date   HGBA1C 9.3 07/25/2017      Assessment & Plan:  Type 2 DM, with CVA: she needs increased rx.   Obesity: new to me: she declines surgery.   HTN: recheck next time.   Patient Instructions  good diet and exercise significantly  improve the control of your diabetes.  please let me know if you wish to be referred to a dietician.  high blood sugar is very risky to your health.  you should see an eye doctor and dentist every year.  It is very important to get all recommended vaccinations.  Controlling your blood pressure and cholesterol drastically reduces the damage diabetes does to your body.  Those who smoke should quit.  Please discuss these with your doctor.  check your blood sugar once a day.  vary the time of day when you check, between before the 3 meals, and at bedtime.  also check if you have symptoms of your blood sugar being too high or too low.  please keep a record of the readings and bring it to your next appointment here (or you can bring the meter itself).  You can write it on any piece of paper.  please call us sooner if your blood sugar goes below 70, or if you have a lot of readings over 200.  Please continue the same tradjenta, and: I have sent a prescription to your pharmacy, to add "repaglinide."   Please come back for a follow-up appointment in 2 months.

## 2017-08-24 ENCOUNTER — Telehealth: Payer: Self-pay | Admitting: Endocrinology

## 2017-08-24 ENCOUNTER — Other Ambulatory Visit: Payer: Self-pay

## 2017-08-24 MED ORDER — LINAGLIPTIN 5 MG PO TABS: 5.0000 mg | ORAL_TABLET | Freq: Every day | ORAL | 99 refills | Status: DC

## 2017-08-24 NOTE — Telephone Encounter (Signed)
Yes, it should be on med list--please refill prn

## 2017-08-24 NOTE — Telephone Encounter (Signed)
Patient called in reference to Rx for tradjenta 5 MG. [facility] stated [facility] will be calling to send in a new request to get this Rx filled for 90 day supply for insurance to cover.

## 2017-08-24 NOTE — Telephone Encounter (Signed)
Prescription sent in at 90 day supply.

## 2017-09-09 ENCOUNTER — Telehealth: Payer: Self-pay | Admitting: Endocrinology

## 2017-09-09 ENCOUNTER — Other Ambulatory Visit: Payer: Self-pay

## 2017-09-09 MED ORDER — LINAGLIPTIN 5 MG PO TABS
5.0000 mg | ORAL_TABLET | Freq: Every day | ORAL | 99 refills | Status: DC
Start: 2017-09-09 — End: 2018-05-08

## 2017-09-09 NOTE — Telephone Encounter (Signed)
tradjenta needs to be called [facility] please asap for a 90 day supply

## 2017-09-23 ENCOUNTER — Ambulatory Visit: Payer: Self-pay | Admitting: Endocrinology

## 2017-10-19 ENCOUNTER — Other Ambulatory Visit: Payer: Self-pay

## 2017-10-19 MED ORDER — REPAGLINIDE 1 MG PO TABS: 1.0000 mg | ORAL_TABLET | Freq: Three times a day (TID) | ORAL | 4 refills | Status: DC

## 2017-11-09 ENCOUNTER — Ambulatory Visit: Payer: Self-pay | Admitting: Endocrinology

## 2017-12-05 ENCOUNTER — Telehealth (HOSPITAL_COMMUNITY): Payer: Self-pay | Admitting: Psychiatry

## 2017-12-05 NOTE — Telephone Encounter (Signed)
12/05/17 - Spoke with patient due to  she walked in the office on last Friday and she stated that she has called the billing office several times and someone was going to call her back but never did. This information was sent to [facility] to review. The patient in our conversation was informed of the following: The patient was very appreciative.   Theres a note in her account from today stating:    PBO DOS 03.09.2018 Per [facility] rep Max call reference [phone] 06.13.2018 processed denied for provider is not credentialed- non-covered under provider responsibility. Credentialed dates for this provider are 04.02.2018 to current - recalling from bad debt and adjusting  Looks like [employer] took care of it and recalled the bill as it was not the patients fault due to [facility] saying the provider is out of network  .

## 2018-01-12 ENCOUNTER — Telehealth: Payer: Self-pay

## 2018-01-12 ENCOUNTER — Other Ambulatory Visit: Payer: Self-pay

## 2018-01-12 MED ORDER — SITAGLIPTIN PHOSPHATE 100 MG PO TABS: 100.0000 mg | ORAL_TABLET | Freq: Every day | ORAL | 1 refills | Status: DC

## 2018-01-12 NOTE — Telephone Encounter (Signed)
PA request received for Tradjenta, please advise  [id number]

## 2018-01-12 NOTE — Telephone Encounter (Signed)
Ok.  100 mg qd.   Please fill x 1 Ov is due

## 2018-01-12 NOTE — Telephone Encounter (Signed)
I tried to call patient to let her know pescription for Januvia was being sent in, but patient has no VM box set up.

## 2018-01-12 NOTE — Telephone Encounter (Signed)
Januvia

## 2018-01-12 NOTE — Telephone Encounter (Signed)
What is alternative? 

## 2018-01-17 DIAGNOSIS — Z124 Encounter for screening for malignant neoplasm of cervix: Secondary | ICD-10-CM | POA: Diagnosis not present

## 2018-01-17 DIAGNOSIS — Z01419 Encounter for gynecological examination (general) (routine) without abnormal findings: Secondary | ICD-10-CM | POA: Diagnosis not present

## 2018-02-13 DIAGNOSIS — N762 Acute vulvitis: Secondary | ICD-10-CM | POA: Diagnosis not present

## 2018-02-28 DIAGNOSIS — Z0289 Encounter for other administrative examinations: Secondary | ICD-10-CM

## 2018-03-22 ENCOUNTER — Telehealth: Payer: Self-pay | Admitting: *Deleted

## 2018-03-22 NOTE — Telephone Encounter (Addendum)
Aretta Nip at [facility] [phone]) called concerning this patient's disability case.  They received records from her last visit here on 03/17/17.  They are aware the patient has not been seen in the last year but need to review her last documented visit.  Dr. Paul Dykes (cell # [phone]) has scheduled a phone meeting on Friday, 03/31/18.  The physician will call Dr. Krista Blue at 11:30am.  She will try her cell phone first and if no answer, the office number.   The phone call may take 15-30 minutes.  They have also sent over paperwork providing information on how to charge for your time during this phone call (on Dr. Rhea Belton desk in POD for review).

## 2018-03-31 ENCOUNTER — Telehealth: Payer: Self-pay | Admitting: Neurology

## 2018-03-31 NOTE — Telephone Encounter (Addendum)
I was able to talk with [employer] individual disability insurance claim, Dr. Paul Dykes, about her previous visit with our office,  Initial evaluation was on February 16, 2017, last visit was March 17, 2017,  I also referred her to her psychiatric evaluation on March 23, 2017, based on phone conversation with our office, the refer was sent to Dr. Norton Pastel in [facility] in July 2019,    "Spoke to patient - she is concerned about the following symptoms: inability to engage in complex conversation, decreased cognitive function, increased anxiety, easily frustrated.  She would like to discuss the next step for her post-stroke psychological health"   I do not have the records from [place],   [name] is from 1137 to 11:52, documentation time is 5 minutes for total of 20 minutes  Reviewed feed back and chart from [facility] on May 15th 2019, time spend 10 minutes.  Generate charge to [employer] of 250 dollars for total time spend 30 minutes

## 2018-04-01 NOTE — Telephone Encounter (Signed)
error 

## 2018-05-02 DIAGNOSIS — Z0289 Encounter for other administrative examinations: Secondary | ICD-10-CM

## 2018-05-08 ENCOUNTER — Ambulatory Visit (INDEPENDENT_AMBULATORY_CARE_PROVIDER_SITE_OTHER): Payer: 59 | Admitting: Neurology

## 2018-05-08 ENCOUNTER — Encounter: Payer: Self-pay | Admitting: Neurology

## 2018-05-08 VITALS — BP 155/93 | HR 98 | Ht 65.0 in | Wt 205.2 lb

## 2018-05-08 DIAGNOSIS — I639 Cerebral infarction, unspecified: Secondary | ICD-10-CM

## 2018-05-08 DIAGNOSIS — R410 Disorientation, unspecified: Secondary | ICD-10-CM | POA: Diagnosis not present

## 2018-05-08 NOTE — Progress Notes (Signed)
PATIENT: Sheila Guerrero DOB: 1961/06/14  Chief Complaint  Patient presents with  . Memory Loss    MMSE 30/30 - 23 animals.  She is concerned about her continued memory difficulty.  History of CVA.     HISTORICAL  Sheila Guerrero is a 57 years old right-handed female, seen in refer by her primary care Dr. Harlan Stains for evaluation of episode of fatigue, zone out, initial evaluation is February 16 2017    I reviewed and summarized the referring note, she had a history of anxiety, diabetes, hypertension, hyperlipidemia, asthma, recurrent angioedema, she had a family history of angioedema, her father had intermittent tongue swelling.  She reported lifelong history of recurrent angioedema, had significant asthma and allergies since early childhood, spent a lot of time in hospital in oxygen tent, around 15, she began to have recurrent angioedema initially involving her hands when she touch some triggering material, as years goes by, she began to have recurrent angioedema mainly involving her eyelids, lips, occasionally abdominal area. She has been seen and tested by different allergist in the past, and was treated with long-term of antihistamine, variable course of steroid without significant improvement.   She is also dealing with mild depression anxiety over the years, but getting worse with her recurrent angioedema, also increased her working responsibility and stress related to work  Since September 2017, she noticed increased difficulty handling her job, frequent panic attacks, which often trigger her angioedema, she has hypertension, diabetes, was treated with metoprolol, metformin, jardiance, because the frequency of her recurrent angioedema, she end up stopping her medications in January 2018 with some improvement of her spells, but she has elevated blood pressure today is 190 over 100 heart rate 120  In addition she complains of transient memory lapse, zoning out episode, when she  was looking at her email, suddenly her email would move to the distance, she has difficulty focusing, lasting for a few minutes, there was also episodes while she was driving in a familiar route, she could not figure out where she was, she has to stop and get her bearing together. There was no seizure activity noticed, she described difficulty focusing, difficulty handling her high demanding job, frequent panic attacks. But some of the described zoning out, transient confusion episode and not associated with clear panic attack.   She also described episode of pouring hot tea on her hand, forgot to turn off the stove.  Laboratory evaluation in February 2018, A1c 6.0, LDL 133, cholesterol 221, creatinine 0.69,  UPDATE March 17 2017: Over the past few weeks, overall she felt much better,  I have personally reviewed MRI of the brain in March 2018, there was evidence of supratentorium small vessel disease, stroke involving left frontal subcortical region, microhemorrhage at right thalamus, right cerebellum,  We also reviewed laboratory evaluations, A1c was 10, mildly low vitamin D 25, otherwise no significant abnormality on iron panel, CBC, CMP, normal ESR, C-reactive protein, ANA,  She is now back on Jardiance  25 mg daily, her glucose level is under better control, also taking Toradol XL 100 mg, her blood pressure is better now,  EEG was normal in March 2018  UPDATE June 10th 2019: Last office clinical visit was in April 2018, she was later referred to neuropsychiatric evaluation, which was completed by Dr. Norton Pastel on on October 10, 2017, I reviewed information in detail, the conclusion is: The test results revealed average if not above average functioning across all cognitive domains, and thinking skills  assessed during the evaluation showed no clinical exceptions, no visual spatial learning was a relatively weakness, from emotional standpoint, there appeared to be a moderate degree of recent  depression with anxiety,  Before today's clinical visit, patient mailed in a long list of complaints in her daily activity,  Patient reported that she has been a avid reader all her life, was very active, she used to work at a high position [employer], but in December 2017, she noted difficulty concentrating, made a wrong decision for people work underneath her, now she continue to complaints following symptoms, struggle to understand consequences and put things together, continue complains of memory issues, while driving some places, even places that she goes regularly, she tends to get confused, 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  We again reviewed previous MRI of brain on March 03, 2017, there is evidence of rounded and ovoid periventricular and subcortical T2 hyperintensity, left frontal subcortical cystic gliosis, most consistent with chronic lacunar infarction, pontine T2 hyperintensity, consistent with chronic small vessel disease, patient denied clear-cut strokelike event that she can remember, there was also evidence of right thalamic chronic cerebral microhemorrhage,  Laboratory evaluation in August 2018 A1c was 9.3  Echocardiogram on March 28, 2017 showed ejection fraction of 55% to 60%, wall motion was normal Ultrasound of carotid arteries showed significant soft plaque distal common carotid artery into the origin of the internal carotid artery, left side to mild to moderate soft plaque distal common carotid artery into the origin of internal carotid artery, less than 39% stenosis bilaterally, vertebral artery are anterograde flow  EEG in March 2018 was normal  REVIEW OF SYSTEMS: Full 14 system  review of systems performed and notable only for  Memory loss, dizziness, environmental allergy, eye itching  ALLERGIES: Allergies  Allergen Reactions  . Latex Rash    HOME MEDICATIONS: Current Outpatient Medications  Medication Sig Dispense Refill  . repaglinide (PRANDIN) 1 MG tablet Take 1 tablet (1 mg total) by mouth 3 (three) times daily before meals. 252 tablet 4  . TOPROL XL 100 MG 24 hr tablet      No current facility-administered medications for this visit.     PAST MEDICAL HISTORY: Past Medical History:  Diagnosis Date  . Angioedema   . Anxiety   . Asthma   . Depression   . Diabetes mellitus, type II ([place])   . Dyslipidemia   . HTN (hypertension) 2013  . Panic attacks   . Pneumonia   . Situational stress     PAST SURGICAL HISTORY: Past Surgical History:  Procedure Laterality Date  . CHOLECYSTECTOMY  2010    FAMILY HISTORY: Family History  Problem Relation Age of Onset  . Hypertension Mother   . Other Mother        blood disorder  . Hypertension Father   . Stroke Father   . Diabetes Maternal Aunt     SOCIAL HISTORY:  Social History   Socioeconomic History  . Marital status: Married    Spouse name: Not on file  . Number of children: 0  . Years of education: Bachelors  . Highest education level: Not on file  Occupational History  .  Occupation: Company secretary  Social Needs  . Financial resource strain: Not on file  . Food insecurity:    Worry: Not on file    Inability: Not on file  . Transportation needs:    Medical: Not on file    Non-medical: Not on file  Tobacco Use  . Smoking status: Never Smoker  . Smokeless tobacco: Never Used  Substance and Sexual Activity  . Alcohol use: Yes    Comment: twice a year  . Drug use: No  . Sexual activity: Yes    Birth control/protection: None  Lifestyle  . Physical activity:    Days per week: Not on file    Minutes per session: Not on file  . Stress: Not on file  Relationships  . Social  connections:    Talks on phone: Not on file    Gets together: Not on file    Attends religious service: Not on file    Active member of club or organization: Not on file    Attends meetings of clubs or organizations: Not on file    Relationship status: Not on file  . Intimate partner violence:    Fear of current or ex partner: Not on file    Emotionally abused: Not on file    Physically abused: Not on file    Forced sexual activity: Not on file  Other Topics Concern  . Not on file  Social History Narrative   Lives at home with husband.   Right-handed.   Drinks 2-3 cups hot tea per day.     PHYSICAL EXAM   Vitals:   05/08/18 1022  BP: (!) 155/93  Pulse: 98  Weight: 205 lb 4 oz (93.1 kg)  Height: _0  (1.651 m)    Not recorded      Body mass index is 34.16 kg/m.  PHYSICAL EXAMNIATION:  Gen: NAD, conversant, well nourised, obese, well groomed                     Cardiovascular: Regular rate rhythm, no peripheral edema, warm, nontender. Eyes: Conjunctivae clear without exudates or hemorrhage Neck: Supple, no carotid bruits. Pulmonary: Clear to auscultation bilaterally   NEUROLOGICAL EXAM:  MMSE - Mini Mental State Exam 05/08/2018  Orientation to time 5  Orientation to Place 5  Registration 3  Attention/ Calculation 5  Recall 3  Language- name 2 objects 2  Language- repeat 1  Language- follow 3 step command 3  Language- read & follow direction 1  Write a sentence 1  Copy design 1  Total score 30  animal naming 23.   CRANIAL NERVES: CN II: Visual fields are full to confrontation. Fundoscopic exam is normal with sharp discs and no vascular changes. Pupils are round equal and briskly reactive to light. CN III, IV, VI: extraocular movement are normal. No ptosis. CN V: Facial sensation is intact to pinprick in all 3 divisions bilaterally. Corneal responses are intact.  CN VII: Face is symmetric with normal eye closure and smile. CN VIII: Hearing is normal to  rubbing fingers CN IX, X: Palate elevates symmetrically. Phonation is normal. CN XI: Head turning and shoulder shrug are intact CN XII: Tongue is midline with normal movements and no atrophy.  MOTOR: There is no pronator drift of out-stretched arms. Muscle bulk and tone are normal. Muscle strength is normal.  REFLEXES: Reflexes are 2+ and symmetric at the biceps, triceps, knees, and ankles. Plantar responses are flexor.  SENSORY: Intact to light  touch, pinprick, positional sensation and vibratory sensation are intact in fingers and toes.  COORDINATION: Rapid alternating movements and fine finger movements are intact. There is no dysmetria on finger-to-nose and heel-knee-shin.    GAIT/STANCE: Posture is normal. Gait is steady with normal steps, base, arm swing, and turning. Heel and toe walking are normal. Tandem gait is normal.  Romberg is absent.   DIAGNOSTIC DATA (LABS, IMAGING, TESTING) - I reviewed patient records, labs, notes, testing and imaging myself where available.   ASSESSMENT AND PLAN  Sheila Guerrero is a 57 y.o. female   Evidence of left frontal subcortical area stroke  Most consistent with small vessel disease   She does have vascular risk factor of hypertension, diabetes,  Continue aspirin daily  I have emphasized with her to importance of control vascular risk factors, diabetes, hypertension, moderate exercise, keep well hydration.  Reported Confusion  Continued complain of cognitive impairment  Mini-Mental Status Examination 30 out of 30 today,  Formal neuropsychiatric evaluation by Dr. Vikki Ports on October 10, 2017 showed no findings consistent with any cognitive disorders, there was appears to be a moderate degree of recent depression anxiety per record,    Marcial Pacas, M.D. Ph.D.  [facility] [address] [place], [place] [address] Ph: [phone] Fax: [phone]  CC: Harlan Stains, MD

## 2018-07-13 DIAGNOSIS — M25511 Pain in right shoulder: Secondary | ICD-10-CM | POA: Insufficient documentation

## 2018-08-22 DIAGNOSIS — I10 Essential (primary) hypertension: Secondary | ICD-10-CM | POA: Diagnosis not present

## 2018-08-22 DIAGNOSIS — E1169 Type 2 diabetes mellitus with other specified complication: Secondary | ICD-10-CM | POA: Diagnosis not present

## 2018-08-22 DIAGNOSIS — E785 Hyperlipidemia, unspecified: Secondary | ICD-10-CM | POA: Diagnosis not present

## 2018-08-25 ENCOUNTER — Other Ambulatory Visit: Payer: Self-pay | Admitting: Endocrinology

## 2018-09-04 ENCOUNTER — Ambulatory Visit (INDEPENDENT_AMBULATORY_CARE_PROVIDER_SITE_OTHER): Payer: 59 | Admitting: Otolaryngology

## 2018-09-04 DIAGNOSIS — H9011 Conductive hearing loss, unilateral, right ear, with unrestricted hearing on the contralateral side: Secondary | ICD-10-CM | POA: Diagnosis not present

## 2018-09-04 DIAGNOSIS — H6121 Impacted cerumen, right ear: Secondary | ICD-10-CM

## 2018-11-07 ENCOUNTER — Other Ambulatory Visit: Payer: Self-pay | Admitting: Endocrinology

## 2018-11-07 NOTE — Telephone Encounter (Signed)
Please refill x 1 Ov is due  

## 2018-11-07 NOTE — Telephone Encounter (Signed)
Last OV 07/25/17 refill or refuse please advise

## 2019-10-10 DIAGNOSIS — M25512 Pain in left shoulder: Secondary | ICD-10-CM | POA: Insufficient documentation

## 2020-03-06 ENCOUNTER — Ambulatory Visit: Payer: 59

## 2020-09-08 ENCOUNTER — Encounter (HOSPITAL_COMMUNITY): Payer: Self-pay

## 2020-09-08 ENCOUNTER — Emergency Department (HOSPITAL_COMMUNITY): Payer: 59

## 2020-09-08 ENCOUNTER — Emergency Department (HOSPITAL_COMMUNITY)
Admission: EM | Admit: 2020-09-08 | Discharge: 2020-09-08 | Disposition: A | Discharge status: Home / Self Care | Payer: 59 | Attending: Emergency Medicine | Admitting: Emergency Medicine

## 2020-09-08 ENCOUNTER — Other Ambulatory Visit: Payer: Self-pay

## 2020-09-08 DIAGNOSIS — E1165 Type 2 diabetes mellitus with hyperglycemia: Secondary | ICD-10-CM | POA: Insufficient documentation

## 2020-09-08 DIAGNOSIS — I10 Essential (primary) hypertension: Secondary | ICD-10-CM | POA: Diagnosis not present

## 2020-09-08 DIAGNOSIS — J45909 Unspecified asthma, uncomplicated: Secondary | ICD-10-CM | POA: Insufficient documentation

## 2020-09-08 DIAGNOSIS — Z91040 Latex allergy status: Secondary | ICD-10-CM | POA: Insufficient documentation

## 2020-09-08 DIAGNOSIS — R739 Hyperglycemia, unspecified: Secondary | ICD-10-CM

## 2020-09-08 DIAGNOSIS — R109 Unspecified abdominal pain: Secondary | ICD-10-CM

## 2020-09-08 DIAGNOSIS — N10 Acute tubulo-interstitial nephritis: Secondary | ICD-10-CM | POA: Diagnosis not present

## 2020-09-08 DIAGNOSIS — B379 Candidiasis, unspecified: Secondary | ICD-10-CM | POA: Diagnosis not present

## 2020-09-08 DIAGNOSIS — Z79899 Other long term (current) drug therapy: Secondary | ICD-10-CM | POA: Diagnosis not present

## 2020-09-08 DIAGNOSIS — Z7984 Long term (current) use of oral hypoglycemic drugs: Secondary | ICD-10-CM | POA: Diagnosis not present

## 2020-09-08 LAB — CBC
HCT: 50.9 % — ABNORMAL HIGH (ref 36.0–46.0)
Hemoglobin: 17.3 g/dL — ABNORMAL HIGH (ref 12.0–15.0)
MCH: 29.6 pg (ref 26.0–34.0)
MCHC: 34 g/dL (ref 30.0–36.0)
MCV: 87 fL (ref 80.0–100.0)
Platelets: 313 10*3/uL (ref 150–400)
RBC: 5.85 MIL/uL — ABNORMAL HIGH (ref 3.87–5.11)
RDW: 12.3 % (ref 11.5–15.5)
WBC: 9.5 10*3/uL (ref 4.0–10.5)
nRBC: 0 % (ref 0.0–0.2)

## 2020-09-08 LAB — URINALYSIS, ROUTINE W REFLEX MICROSCOPIC
Bilirubin Urine: NEGATIVE
Glucose, UA: >=500 mg/dL — AB
Hgb urine dipstick: NEGATIVE
Ketones, ur: 5 mg/dL — AB
Nitrite: NEGATIVE
Protein, ur: 100 mg/dL — AB
Specific Gravity, Urine: 1.029 (ref 1.005–1.030)
WBC, UA: >50 WBC/hpf — ABNORMAL HIGH (ref 0–5)
pH: 5 (ref 5.0–8.0)

## 2020-09-08 LAB — COMPREHENSIVE METABOLIC PANEL
ALT: 22 U/L (ref 0–44)
AST: 20 U/L (ref 15–41)
Albumin: 4.2 g/dL (ref 3.5–5.0)
Alkaline Phosphatase: 87 U/L (ref 38–126)
Anion gap: 11 (ref 5–15)
BUN: 16 mg/dL (ref 6–20)
CO2: 24 mmol/L (ref 22–32)
Calcium: 9.2 mg/dL (ref 8.9–10.3)
Chloride: 100 mmol/L (ref 98–111)
Creatinine, Ser: 0.72 mg/dL (ref 0.44–1.00)
GFR, Estimated: >60 mL/min (ref >60)
Glucose, Bld: 388 mg/dL — ABNORMAL HIGH (ref 70–99)
Potassium: 3.9 mmol/L (ref 3.5–5.1)
Sodium: 135 mmol/L (ref 135–145)
Total Bilirubin: 1.1 mg/dL (ref 0.3–1.2)
Total Protein: 7.5 g/dL (ref 6.5–8.1)

## 2020-09-08 LAB — LIPASE, BLOOD: Lipase: 46 U/L (ref 11–51)

## 2020-09-08 MED ORDER — FLUCONAZOLE 200 MG PO TABS: 200.0000 mg | ORAL_TABLET | Freq: Every day | ORAL | 0 refills | Status: AC

## 2020-09-08 MED ORDER — CEPHALEXIN 500 MG PO CAPS: 500.0000 mg | ORAL_CAPSULE | Freq: Four times a day (QID) | ORAL | 0 refills | Status: DC

## 2020-09-08 MED ORDER — HYDROCODONE-ACETAMINOPHEN 5-325 MG PO TABS: 1.0000 | ORAL_TABLET | Freq: Four times a day (QID) | ORAL | 0 refills | Status: DC | PRN

## 2020-09-08 MED ORDER — KETOROLAC TROMETHAMINE 15 MG/ML IJ SOLN
15.0000 mg | Freq: Once | INTRAMUSCULAR | Status: AC
  Administered 2020-09-08: 15 mg via INTRAVENOUS
  Filled 2020-09-08: qty 1

## 2020-09-08 NOTE — Discharge Instructions (Signed)
Your CAT scan today showed some mild inflammation around the kidney but no sign of a kidney stone or blockage. Urine did show many white blood cells concern for possible infection as the cause for this. Also you have a yeast infection. Urine was cultured but we will start you on antibiotics, antiyeast medication and you can take pain medication at home as needed when the pain is severe. If this pain is result of infection it should be improving by Friday but it does not you need to see your doctor for recheck. In the meantime if you start developing high fever, vomiting worsening pain please return to the emergency room.

## 2020-09-08 NOTE — ED Triage Notes (Addendum)
Pt reports left upper abdominal pain that started from the lower back 10 days ago. 4/10 pain   Denies urinary symptoms, denies N/v, or diarrhea. Denied shob.   Denies injury or trauma    A/ox4 Ambulatory in triage

## 2020-09-08 NOTE — ED Provider Notes (Signed)
[facility] DEPT Provider Note   CSN: [id number] Arrival date & time: 09/08/20  4970     History Chief Complaint  Patient presents with  . Abdominal Pain    Sheila Guerrero is a 59 y.o. female.  The history is provided by the patient.  Abdominal Pain Pain location:  L flank Pain quality: aching, cramping and shooting   Pain radiates to:  Does not radiate Pain severity:  Moderate Onset quality:  Gradual Duration:  10 days Timing:  Constant Progression:  Unchanged Chronicity:  New Context comment:  Patient reports prior to the side pain she had some mild pain in her back that she thought was musculoskeletal but then it moved to her left side and has been present there for 10 days Relieved by: Better with out stretching or doing certain movements and some improvement with her husband's tramadol. Exacerbated by: Twisting, reaching laying down and sitting. Ineffective treatments:  Acetaminophen, heat and NSAIDs Associated symptoms: no anorexia, no chest pain, no constipation, no diarrhea, no dysuria, no fever, no nausea, no shortness of breath, no vaginal bleeding and no vomiting   Associated symptoms comment:  Dark urine this morning for the first time Risk factors comment:  History of diabetes, hypertension and dyslipidemia      Past Medical History:  Diagnosis Date  . Angioedema   . Anxiety   . Asthma   . Depression   . Diabetes mellitus, type II ([place])   . Dyslipidemia   . HTN (hypertension) 2013  . Panic attacks   . Pneumonia   . Situational stress     Patient Active Problem List   Diagnosis Date Noted  . Confusion 05/08/2018  . Stroke ([place]) 03/17/2017  . DM (diabetes mellitus) ([place]) 03/17/2017  . Confusion state 02/16/2017  . Angioedema 02/16/2017  . Panic 02/04/2017  . Adjustment disorder with anxious mood 02/04/2017  . Sinusitis, chronic 12/28/2011  . Rhinitis, allergic 12/28/2011  . Extrinsic asthma, unspecified  12/07/2011    Past Surgical History:  Procedure Laterality Date  . CHOLECYSTECTOMY  2010     OB History   No obstetric history on file.     Family History  Problem Relation Age of Onset  . Hypertension Mother   . Other Mother        blood disorder  . Hypertension Father   . Stroke Father   . Diabetes Maternal Aunt     Social History   Tobacco Use  . Smoking status: Never Smoker  . Smokeless tobacco: Never Used  Substance Use Topics  . Alcohol use: Yes    Comment: twice a year  . Drug use: No    Home Medications Prior to Admission medications   Medication Sig Start Date End Date Taking? Authorizing Provider  repaglinide (PRANDIN) 1 MG tablet Take 1 tablet (1 mg total) by mouth 3 (three) times daily before meals. 10/19/17   Renato Shin, MD  repaglinide (PRANDIN) 1 MG tablet TAKE 1 TABLET (1 MG TOTAL) BY MOUTH 3 (THREE) TIMES DAILY BEFORE MEALS. 08/25/18   Renato Shin, MD  repaglinide (PRANDIN) 1 MG tablet TAKE 1 TABLET BY MOUTH 3  TIMES A DAY BEFORE MEALS 11/07/18   Renato Shin, MD  TOPROL XL 100 MG 24 hr tablet  02/25/17   [provider]    Allergies    Latex  Review of Systems   Review of Systems  Constitutional: Negative for fever.  Respiratory: Negative for shortness of breath.   Cardiovascular:  Negative for chest pain.  Gastrointestinal: Positive for abdominal pain. Negative for anorexia, constipation, diarrhea, nausea and vomiting.  Genitourinary: Negative for dysuria and vaginal bleeding.  All other systems reviewed and are negative.   Physical Exam Updated Vital Signs BP (!) 153/104   Pulse (!) 120   Temp 98.6 F (37 C) (Oral)   Resp 20   LMP 11/25/2011   SpO2 98%   Physical Exam Vitals and nursing note reviewed.  Constitutional:      General: She is not in acute distress.    Appearance: She is well-developed. She is obese.  HENT:     Head: Normocephalic and atraumatic.  Eyes:     Pupils: Pupils are equal, round, and  reactive to light.  Cardiovascular:     Rate and Rhythm: Normal rate and regular rhythm.     Heart sounds: Normal heart sounds. No murmur heard.  No friction rub.  Pulmonary:     Effort: Pulmonary effort is normal.     Breath sounds: Normal breath sounds. No wheezing or rales.  Abdominal:     General: Bowel sounds are normal. There is no distension.     Palpations: Abdomen is soft.     Tenderness: There is no abdominal tenderness. There is left CVA tenderness. There is no guarding or rebound.     Comments: Mild left CVA tenderness  Musculoskeletal:        General: No tenderness. Normal range of motion.     Comments: No edema.  No midline thoracic or lumbar tenderness.  No reproducible paraspinal tenderness  Skin:    General: Skin is warm and dry.     Findings: No rash.  Neurological:     Mental Status: She is alert and oriented to person, place, and time.     Cranial Nerves: No cranial nerve deficit.  Psychiatric:        Behavior: Behavior normal.     ED Results / Procedures / Treatments   Labs (all labs ordered are listed, but only abnormal results are displayed) Labs Reviewed  COMPREHENSIVE METABOLIC PANEL - Abnormal; Notable for the following components:      Result Value   Glucose, Bld 388 (*)    All other components within normal limits  CBC - Abnormal; Notable for the following components:   RBC 5.85 (*)    Hemoglobin 17.3 (*)    HCT 50.9 (*)    All other components within normal limits  URINALYSIS, ROUTINE W REFLEX MICROSCOPIC - Abnormal; Notable for the following components:   APPearance HAZY (*)    Glucose, UA >=500 (*)    Ketones, ur 5 (*)    Protein, ur 100 (*)    Leukocytes,Ua SMALL (*)    WBC, UA >50 (*)    Bacteria, UA RARE (*)    All other components within normal limits  LIPASE, BLOOD    EKG None  Radiology CT Renal Stone Study  Result Date: 09/08/2020 CLINICAL DATA:  Flank pain, kidney stones suspected EXAM: CT ABDOMEN AND PELVIS WITHOUT  CONTRAST TECHNIQUE: Multidetector CT imaging of the abdomen and pelvis was performed following the standard protocol without IV contrast. COMPARISON:  None FINDINGS: Lower chest: No consolidation.  No pleural effusion. Hepatobiliary: Moderate to marked hepatic steatosis. Liver less dense than vascular structures. Post cholecystectomy. No gross biliary duct dilation. Fatty sparing along the gallbladder fossa. Pancreas: Pancreas normal without ductal dilation or sign of inflammation. Spleen: Spleen normal in size and contour. Adrenals/Urinary Tract: Adrenal glands  are normal. No hydronephrosis. Mild perinephric stranding bilaterally. No nephrolithiasis. Mild cortical scarring of the bilateral kidneys. Stomach/Bowel: Normal appendix.  No acute gastrointestinal process. Vascular/Lymphatic: Normal caliber abdominal aorta. No aneurysmal dilation. There is no gastrohepatic or hepatoduodenal ligament lymphadenopathy. No retroperitoneal or mesenteric lymphadenopathy. No pelvic sidewall lymphadenopathy. Reproductive: Lobulated uterine contours.  No priors for comparison. Other: No ascites.  Small fat containing umbilical hernia. Musculoskeletal: Spinal degenerative changes. No acute or destructive bone process. IMPRESSION: 1. No nephrolithiasis or hydronephrosis. Mild perinephric stranding may be chronic, this could also be seen in the setting of UTI. Correlate with urine studies. 2. Lobular contours of the uterus may be related to fibroids though this is incompletely assessed on the current study. Consider follow-up with nonemergent pelvic sonogram for further evaluation. 3. Moderate to marked hepatic steatosis. 4. Post cholecystectomy. Electronically Signed   By: Zetta Bills M.D.   On: 09/08/2020 12:15    Procedures Procedures (including critical care time)  Medications Ordered in ED Medications - No data to display  ED Course  I have reviewed the triage vital signs and the nursing notes.  Pertinent labs &  imaging results that were available during my care of the patient were reviewed by me and considered in my medical decision making (see chart for details).    MDM Rules/Calculators/A&P                          Patient presenting now with 10 days of left side pain which is more in the CVA region than anywhere else.  She also noticed dark urine this morning for the first time.  She has had no infectious symptoms such as fever and denies any cough or shortness of breath.  She has had no nausea vomiting or bowel changes.  Only twisting and reaching seem to make the pain worse as well as sitting and laying down.  She is the most comfortable if she is up moving around.  No prior history of similar pain.  Patient reports that she has had intermittent back issues but this feels different and has never had back surgery.  She is status post cholecystectomy but has no abdominal pain on exam today and low suspicion for urinary or GU issues.  Patient's labs are pending but concern for MSK pain versus kidney stone.  We will do renal stone study.  Currently patient's pain is a 4 out of 10 and she appears fairly well.  1:12 PM Lipase and CMP are within normal limits except for hyperglycemia 388.  Normal anion gap and no sign of DKA, CBC with normal white count.  Urine showing small leukocytes, greater than 50 white blood cells, rare bacteria as well as yeast present.  CT showed no evidence of renal stone and there is no evidence of hydronephrosis however there is mild perinephric stranding which they report may be chronic but could also be seen in the setting of UTI.  Patient also appears to have fibroids in the uterus and recommend follow-up with OB/GYN.  Given patient's change in urine and ongoing back pain will treat with a course of antibiotics/ diflucan for the yeast but also this could still be MSK pain and needs to follow-up with PCP.  Final Clinical Impression(s) / ED Diagnoses Final diagnoses:  Left flank pain   Acute pyelonephritis  Candida infection  Hyperglycemia    Rx / DC Orders ED Discharge Orders         Ordered  fluconazole (DIFLUCAN) 200 MG tablet  Daily        09/08/20 1326    cephALEXin (KEFLEX) 500 MG capsule  4 times daily        09/08/20 1326    HYDROcodone-acetaminophen (NORCO/VICODIN) 5-325 MG tablet  Every 6 hours PRN        09/08/20 1326           Blanchie Dessert, MD 09/08/20 1326

## 2020-09-09 LAB — URINE CULTURE

## 2021-02-02 ENCOUNTER — Ambulatory Visit: Payer: 59 | Admitting: Neurology

## 2021-02-02 ENCOUNTER — Encounter: Payer: Self-pay | Admitting: Neurology

## 2021-02-02 VITALS — BP 160/94 | HR 104 | Ht 65.0 in | Wt 186.0 lb

## 2021-02-02 DIAGNOSIS — R269 Unspecified abnormalities of gait and mobility: Secondary | ICD-10-CM | POA: Diagnosis not present

## 2021-02-02 DIAGNOSIS — R202 Paresthesia of skin: Secondary | ICD-10-CM | POA: Diagnosis not present

## 2021-02-02 DIAGNOSIS — I639 Cerebral infarction, unspecified: Secondary | ICD-10-CM

## 2021-02-02 MED ORDER — CLOPIDOGREL BISULFATE 75 MG PO TABS: 75.0000 mg | ORAL_TABLET | Freq: Every day | ORAL | 11 refills | Status: DC

## 2021-02-02 NOTE — Progress Notes (Signed)
Chief Complaint  Patient presents with  . Consult    She is here with her husband, Alveta Heimlich. Last seen 04/2018. Hx of CVA. Reports three weeks ago, she woke up with numbness/tingling on the left side of her body. She went to her PCP for evaluation. States PCP felt it may be related to her diabetic neuropathy. Her gait/balance has also changed and she is now having to rely on the assistance of a cane. She is worried she may have had another stroke.   . Pain    Also, concerned about worsening neuropathy. She was seen by [facility] today. They instructed her to follow up here and did not schedule a follow up for her. She is currently taking pregabalin 75mg , one capsule BID.    HISTORICAL Sheila Guerrero is a 60 years old right-handed female, seen in refer by her primary care Dr.  Nancy Fetter, [facility], for evaluation of acute onset left-sided numbness, weakness, is accompanied by her husband at today's visit  I saw her previously in 2018  She reported lifelong history of angioedema, very sensitive to medications, around 2017, she complains of worsening depression anxiety, worsening angioedema and increased working load, reported frequent panic attacks, difficulty handling her jobs, transient memory lapse, zoning out episode, difficulty focusing, especially looking at computer screens,  Further work-up, MRI of the brain 2018, there is evidence of rounded and ovoid periventricular and subcortical T2 hyperintensity, left frontal subcortical cystic gliosis, most consistent with chronic lacunar infarction, pontine T2 hyperintensity, consistent with chronic small vessel disease, patient denied clear-cut strokelike event that she can remember, there was also evidence of right thalamic chronic cerebral microhemorrhage,  She was diagnosed with diabetes, A1c of 10, LDL of 133, cholesterol of 221, also diagnosed with hypertension,  She also had neuropsychiatric evaluation by Dr. Norton Pastel on on  October 10, 2017, I reviewed information in detail, the conclusion is: The test results revealed average if not above average functioning across all cognitive domains, and thinking skills assessed during the evaluation showed no clinical exceptions, no visual spatial learning was a relatively weakness, from emotional standpoint, there appeared to be a moderate degree of  depression with anxiety,  Echocardiogram on March 28, 2017 showed ejection fraction of 55% to 60%, wall motion was normal Ultrasound of carotid arteries showed significant soft plaque distal common carotid artery into the origin of the internal carotid artery, left side to mild to moderate soft plaque distal common carotid artery into the origin of internal carotid artery, less than 39% stenosis bilaterally, vertebral artery are anterograde flow  EEG in March 2018 was normal  Despite convincing evidence of diagnosis of vascular risk factor of poorly controlled diabetes, hypertension, hyperlipidemia, she is very reluctant to take any medications, only take aspirin 81 mg daily, was not able to go back to her previous job, husband concurred her depression, anxiety, extreme fatigue.  She woke up Jan 12 2021, reported sudden onset of left face, arm, leg weakness, numbness, gait abnormalities,   She was recently seen by [facility], was put on Lyrica 75 mg twice daily, complains of side effect from medication, sleepy, drugged out, but no angioedema, was seen by primary care recently, was put on Lipitor 20 mg daily, lisinopril 20 mg daily, Metformin XR 500 mg 4 tablets daily, trazodone 100 mg at bedtime   REVIEW OF SYSTEMS: Full 14 system review of systems performed and notable only for as above All other review of systems were negative.  ALLERGIES: Allergies  Allergen Reactions  . Sulfa Antibiotics Swelling  . Latex Rash    HOME MEDICATIONS: Current Outpatient Medications  Medication Sig Dispense Refill  .  atorvastatin (LIPITOR) 20 MG tablet Take 20 mg by mouth daily.    Marland Kitchen lisinopril (ZESTRIL) 20 MG tablet Take 20 mg by mouth daily.    . metFORMIN (GLUCOPHAGE-XR) 500 MG 24 hr tablet Take 4 tablets by mouth daily.    . pregabalin (LYRICA) 75 MG capsule Take 75 mg by mouth 2 (two) times daily.    . traZODone (DESYREL) 100 MG tablet Take 100 mg by mouth at bedtime.     No current facility-administered medications for this visit.    PAST MEDICAL HISTORY: Past Medical History:  Diagnosis Date  . Angioedema   . Anxiety   . Asthma   . Depression   . Diabetes mellitus, type II ([place])   . Dyslipidemia   . History of CVA (cerebrovascular accident)   . HTN (hypertension) 2013  . Panic attacks   . Pneumonia   . Situational stress     PAST SURGICAL HISTORY: Past Surgical History:  Procedure Laterality Date  . CHOLECYSTECTOMY  2010    FAMILY HISTORY: Family History  Problem Relation Age of Onset  . Hypertension Mother   . Other Mother        blood disorder  . Hypertension Father   . Stroke Father   . Diabetes Maternal Aunt     SOCIAL HISTORY: Social History   Socioeconomic History  . Marital status: Married    Spouse name: Not on file  . Number of children: 0  . Years of education: Bachelors  . Highest education level: Not on file  Occupational History  . Occupation: Homemaker now  Tobacco Use  . Smoking status: Never Smoker  . Smokeless tobacco: Never Used  Substance and Sexual Activity  . Alcohol use: Yes    Comment: rarely  . Drug use: Never  . Sexual activity: Yes    Birth control/protection: None  Other Topics Concern  . Not on file  Social History Narrative   Lives at home with husband.   Right-handed.   No daily use of caffeine.   Social Determinants of Health   Financial Resource Strain: Not on file  Food Insecurity: Not on file  Transportation Needs: Not on file  Physical Activity: Not on file  Stress: Not on file  Social Connections: Not on file   Intimate Partner Violence: Not on file     PHYSICAL EXAM   Vitals:   02/02/21 1458  BP: (!) 160/94  Pulse: (!) 104  Weight: 186 lb (84.4 kg)  Height: 5\' 5"  (1.651 m)   Not recorded     Body mass index is 30.95 kg/m.  PHYSICAL EXAMNIATION:  Gen: NAD, conversant, well nourised, well groomed                     Cardiovascular: Regular rate rhythm, no peripheral edema, warm, nontender. Eyes: Conjunctivae clear without exudates or hemorrhage Neck: Supple, no carotid bruits. Pulmonary: Clear to auscultation bilaterally   NEUROLOGICAL EXAM:  MENTAL STATUS: Depressed healthy looking middle-aged female Speech:    Speech is normal; fluent and spontaneous with normal comprehension.  Cognition:     Orientation to time, place and person     Normal recent and remote memory     Normal Attention span and concentration     Normal Language, naming, repeating,spontaneous speech  Fund of knowledge   CRANIAL NERVES: CN II: Visual fields are full to confrontation. Pupils are round equal and briskly reactive to light. CN III, IV, VI: extraocular movement are normal. No ptosis. CN V: Facial sensation is intact to light touch CN VII: Face is symmetric with normal eye closure  CN VIII: Hearing is normal to causal conversation. CN IX, X: Phonation is normal. CN XI: Head turning and shoulder shrug are intact  MOTOR: Fixation of left upper extremity upon rotating movement, left leg drift,  REFLEXES: Reflexes are 2+ and symmetric at the biceps, triceps, knees, and ankles. Plantar responses are flexor.  SENSORY: Dense dependent decreased vibratory sensation, light touch, pinprick to mid shin level  COORDINATION: There is no trunk or limb dysmetria noted.  GAIT/STANCE: She needs push-up to get up from seated position, unsteady, dragging her left leg,   DIAGNOSTIC DATA (LABS, IMAGING, TESTING) - I reviewed patient records, labs, notes, testing and imaging myself where  available.   ASSESSMENT AND PLAN  Sheila Guerrero is a 60 y.o. female  Sudden onset of left-sided paresthesia, weakness since February 2022  Mostly worried about the possibility of right thalamic/posterior limb capsule stroke  She has a vascular risk factor of hypertension, hyperlipidemia, diabetes obesity, sedentary lifestyle, was not treated with medication for her vascular risk factors over the past few years due to her concern of angioedema,  Emphasized the importance of address vascular risk factor,  Start Plavix daily  Complete evaluation  MRI of brain without contrast  Home physical therapy  Marcial Pacas, M.D. Ph.D.  [facility] [address] [place], [place] [address] Ph: [phone] Fax: [phone]  CC:  Donald Prose, MD

## 2021-02-03 ENCOUNTER — Telehealth: Payer: Self-pay | Admitting: Neurology

## 2021-02-03 NOTE — Telephone Encounter (Signed)
LVM for pt to call back to schedule [facility] 02/03/21 02/03/21 [employer] auth: [id number] (exp. 02/03/21 to 03/20/21)

## 2021-02-03 NOTE — Telephone Encounter (Signed)
Patient returned the call she is scheduled at [facility] for 02/10/21.

## 2021-02-07 ENCOUNTER — Observation Stay (HOSPITAL_COMMUNITY): Payer: 59

## 2021-02-07 ENCOUNTER — Inpatient Hospital Stay (HOSPITAL_COMMUNITY)
Admission: EM | Admit: 2021-02-07 | Discharge: 2021-02-12 | DRG: 064 | Disposition: A | Discharge status: Home Health | Payer: 59 | Attending: Internal Medicine | Admitting: Internal Medicine

## 2021-02-07 ENCOUNTER — Other Ambulatory Visit: Payer: Self-pay

## 2021-02-07 ENCOUNTER — Emergency Department (HOSPITAL_COMMUNITY): Payer: 59

## 2021-02-07 ENCOUNTER — Encounter (HOSPITAL_COMMUNITY): Payer: Self-pay | Admitting: Emergency Medicine

## 2021-02-07 DIAGNOSIS — E1169 Type 2 diabetes mellitus with other specified complication: Secondary | ICD-10-CM | POA: Diagnosis not present

## 2021-02-07 DIAGNOSIS — Z882 Allergy status to sulfonamides status: Secondary | ICD-10-CM

## 2021-02-07 DIAGNOSIS — Z888 Allergy status to other drugs, medicaments and biological substances status: Secondary | ICD-10-CM

## 2021-02-07 DIAGNOSIS — Z6830 Body mass index (BMI) 30.0-30.9, adult: Secondary | ICD-10-CM

## 2021-02-07 DIAGNOSIS — I639 Cerebral infarction, unspecified: Secondary | ICD-10-CM | POA: Diagnosis not present

## 2021-02-07 DIAGNOSIS — E1140 Type 2 diabetes mellitus with diabetic neuropathy, unspecified: Secondary | ICD-10-CM | POA: Diagnosis present

## 2021-02-07 DIAGNOSIS — I330 Acute and subacute infective endocarditis: Secondary | ICD-10-CM | POA: Diagnosis present

## 2021-02-07 DIAGNOSIS — I63511 Cerebral infarction due to unspecified occlusion or stenosis of right middle cerebral artery: Principal | ICD-10-CM | POA: Diagnosis present

## 2021-02-07 DIAGNOSIS — I10 Essential (primary) hypertension: Secondary | ICD-10-CM

## 2021-02-07 DIAGNOSIS — Z7902 Long term (current) use of antithrombotics/antiplatelets: Secondary | ICD-10-CM

## 2021-02-07 DIAGNOSIS — Z8673 Personal history of transient ischemic attack (TIA), and cerebral infarction without residual deficits: Secondary | ICD-10-CM

## 2021-02-07 DIAGNOSIS — E785 Hyperlipidemia, unspecified: Secondary | ICD-10-CM | POA: Diagnosis not present

## 2021-02-07 DIAGNOSIS — F32A Depression, unspecified: Secondary | ICD-10-CM | POA: Diagnosis present

## 2021-02-07 DIAGNOSIS — Z7984 Long term (current) use of oral hypoglycemic drugs: Secondary | ICD-10-CM

## 2021-02-07 DIAGNOSIS — I6381 Other cerebral infarction due to occlusion or stenosis of small artery: Secondary | ICD-10-CM | POA: Diagnosis present

## 2021-02-07 DIAGNOSIS — Z79899 Other long term (current) drug therapy: Secondary | ICD-10-CM

## 2021-02-07 DIAGNOSIS — I43 Cardiomyopathy in diseases classified elsewhere: Secondary | ICD-10-CM | POA: Diagnosis present

## 2021-02-07 DIAGNOSIS — R202 Paresthesia of skin: Secondary | ICD-10-CM | POA: Diagnosis not present

## 2021-02-07 DIAGNOSIS — I081 Rheumatic disorders of both mitral and tricuspid valves: Secondary | ICD-10-CM | POA: Diagnosis present

## 2021-02-07 DIAGNOSIS — I119 Hypertensive heart disease without heart failure: Secondary | ICD-10-CM | POA: Diagnosis present

## 2021-02-07 DIAGNOSIS — R7881 Bacteremia: Secondary | ICD-10-CM

## 2021-02-07 DIAGNOSIS — Z20822 Contact with and (suspected) exposure to covid-19: Secondary | ICD-10-CM | POA: Diagnosis present

## 2021-02-07 DIAGNOSIS — I76 Septic arterial embolism: Secondary | ICD-10-CM | POA: Diagnosis present

## 2021-02-07 DIAGNOSIS — E440 Moderate protein-calorie malnutrition: Secondary | ICD-10-CM | POA: Diagnosis present

## 2021-02-07 DIAGNOSIS — Z91041 Radiographic dye allergy status: Secondary | ICD-10-CM

## 2021-02-07 DIAGNOSIS — Z8249 Family history of ischemic heart disease and other diseases of the circulatory system: Secondary | ICD-10-CM

## 2021-02-07 DIAGNOSIS — M5431 Sciatica, right side: Secondary | ICD-10-CM | POA: Diagnosis present

## 2021-02-07 DIAGNOSIS — Z9049 Acquired absence of other specified parts of digestive tract: Secondary | ICD-10-CM

## 2021-02-07 DIAGNOSIS — I672 Cerebral atherosclerosis: Secondary | ICD-10-CM | POA: Diagnosis present

## 2021-02-07 DIAGNOSIS — F410 Panic disorder [episodic paroxysmal anxiety] without agoraphobia: Secondary | ICD-10-CM | POA: Diagnosis present

## 2021-02-07 DIAGNOSIS — E1165 Type 2 diabetes mellitus with hyperglycemia: Secondary | ICD-10-CM | POA: Diagnosis present

## 2021-02-07 DIAGNOSIS — Z91040 Latex allergy status: Secondary | ICD-10-CM

## 2021-02-07 DIAGNOSIS — Z823 Family history of stroke: Secondary | ICD-10-CM

## 2021-02-07 DIAGNOSIS — E876 Hypokalemia: Secondary | ICD-10-CM | POA: Diagnosis not present

## 2021-02-07 DIAGNOSIS — I339 Acute and subacute endocarditis, unspecified: Secondary | ICD-10-CM

## 2021-02-07 DIAGNOSIS — R270 Ataxia, unspecified: Secondary | ICD-10-CM | POA: Diagnosis present

## 2021-02-07 DIAGNOSIS — Z833 Family history of diabetes mellitus: Secondary | ICD-10-CM

## 2021-02-07 HISTORY — DX: Family history of other specified conditions: Z84.89

## 2021-02-07 LAB — RAPID URINE DRUG SCREEN, HOSP PERFORMED
Amphetamines: NOT DETECTED
Barbiturates: NOT DETECTED
Benzodiazepines: NOT DETECTED
Cocaine: NOT DETECTED
Opiates: POSITIVE — AB
Tetrahydrocannabinol: NOT DETECTED

## 2021-02-07 LAB — DIFFERENTIAL
Abs Immature Granulocytes: 0.03 10*3/uL (ref 0.00–0.07)
Basophils Absolute: 0.1 10*3/uL (ref 0.0–0.1)
Basophils Relative: 1 %
Eosinophils Absolute: 0.1 10*3/uL (ref 0.0–0.5)
Eosinophils Relative: 1 %
Immature Granulocytes: 0 %
Lymphocytes Relative: 25 %
Lymphs Abs: 2.2 10*3/uL (ref 0.7–4.0)
Monocytes Absolute: 0.7 10*3/uL (ref 0.1–1.0)
Monocytes Relative: 7 %
Neutro Abs: 6 10*3/uL (ref 1.7–7.7)
Neutrophils Relative %: 66 %

## 2021-02-07 LAB — CBC
HCT: 45.8 % (ref 36.0–46.0)
Hemoglobin: 15.7 g/dL — ABNORMAL HIGH (ref 12.0–15.0)
MCH: 29.5 pg (ref 26.0–34.0)
MCHC: 34.3 g/dL (ref 30.0–36.0)
MCV: 86.1 fL (ref 80.0–100.0)
Platelets: 301 10*3/uL (ref 150–400)
RBC: 5.32 MIL/uL — ABNORMAL HIGH (ref 3.87–5.11)
RDW: 12.6 % (ref 11.5–15.5)
WBC: 9 10*3/uL (ref 4.0–10.5)
nRBC: 0 % (ref 0.0–0.2)

## 2021-02-07 LAB — COMPREHENSIVE METABOLIC PANEL
ALT: 21 U/L (ref 0–44)
AST: 16 U/L (ref 15–41)
Albumin: 3.8 g/dL (ref 3.5–5.0)
Alkaline Phosphatase: 71 U/L (ref 38–126)
Anion gap: 9 (ref 5–15)
BUN: 18 mg/dL (ref 6–20)
CO2: 25 mmol/L (ref 22–32)
Calcium: 9.2 mg/dL (ref 8.9–10.3)
Chloride: 105 mmol/L (ref 98–111)
Creatinine, Ser: 0.53 mg/dL (ref 0.44–1.00)
GFR, Estimated: >60 mL/min (ref >60)
Glucose, Bld: 298 mg/dL — ABNORMAL HIGH (ref 70–99)
Potassium: 3.4 mmol/L — ABNORMAL LOW (ref 3.5–5.1)
Sodium: 139 mmol/L (ref 135–145)
Total Bilirubin: 0.8 mg/dL (ref 0.3–1.2)
Total Protein: 6.8 g/dL (ref 6.5–8.1)

## 2021-02-07 LAB — URINALYSIS, ROUTINE W REFLEX MICROSCOPIC
Bacteria, UA: NONE SEEN
Bilirubin Urine: NEGATIVE
Glucose, UA: 150 mg/dL — AB
Hgb urine dipstick: NEGATIVE
Ketones, ur: 5 mg/dL — AB
Nitrite: NEGATIVE
Protein, ur: NEGATIVE mg/dL
Specific Gravity, Urine: 1.026 (ref 1.005–1.030)
pH: 5 (ref 5.0–8.0)

## 2021-02-07 LAB — I-STAT BETA HCG BLOOD, ED (MC, WL, AP ONLY): I-stat hCG, quantitative: 6.7 m[IU]/mL — ABNORMAL HIGH (ref <5)

## 2021-02-07 LAB — PROTIME-INR
INR: 1 (ref 0.8–1.2)
Prothrombin Time: 12.8 seconds (ref 11.4–15.2)

## 2021-02-07 LAB — GLUCOSE, CAPILLARY: Glucose-Capillary: 309 mg/dL — ABNORMAL HIGH (ref 70–99)

## 2021-02-07 LAB — RESP PANEL BY RT-PCR (FLU A&B, COVID) ARPGX2
Influenza A by PCR: NEGATIVE
Influenza B by PCR: NEGATIVE
SARS Coronavirus 2 by RT PCR: NEGATIVE

## 2021-02-07 LAB — APTT: aPTT: 26 seconds (ref 24–36)

## 2021-02-07 MED ORDER — ACETAMINOPHEN 160 MG/5ML PO SOLN: 650.0000 mg | ORAL | Status: DC | PRN

## 2021-02-07 MED ORDER — PREGABALIN 75 MG PO CAPS
75.0000 mg | ORAL_CAPSULE | Freq: Every day | ORAL | Status: DC
  Administered 2021-02-07 – 2021-02-08 (×2): 75 mg via ORAL
  Filled 2021-02-07 (×2): qty 1

## 2021-02-07 MED ORDER — CLOPIDOGREL BISULFATE 75 MG PO TABS
75.0000 mg | ORAL_TABLET | Freq: Every day | ORAL | Status: DC
Start: 2021-02-07 — End: 2021-02-12
  Administered 2021-02-07 – 2021-02-12 (×6): 75 mg via ORAL
  Filled 2021-02-07 (×6): qty 1

## 2021-02-07 MED ORDER — POTASSIUM CHLORIDE CRYS ER 20 MEQ PO TBCR
20.0000 meq | EXTENDED_RELEASE_TABLET | Freq: Once | ORAL | Status: AC
  Administered 2021-02-07: 20 meq via ORAL
  Filled 2021-02-07: qty 1

## 2021-02-07 MED ORDER — ACETAMINOPHEN 325 MG PO TABS
650.0000 mg | ORAL_TABLET | ORAL | Status: DC | PRN
  Administered 2021-02-07 – 2021-02-09 (×3): 650 mg via ORAL
  Filled 2021-02-07 (×4): qty 2

## 2021-02-07 MED ORDER — ATORVASTATIN CALCIUM 10 MG PO TABS: 20.0000 mg | ORAL_TABLET | Freq: Every day | ORAL | Status: DC

## 2021-02-07 MED ORDER — ACETAMINOPHEN 650 MG RE SUPP: 650.0000 mg | RECTAL | Status: DC | PRN

## 2021-02-07 MED ORDER — LISINOPRIL 20 MG PO TABS
20.0000 mg | ORAL_TABLET | Freq: Every day | ORAL | Status: DC
  Administered 2021-02-07 – 2021-02-08 (×2): 20 mg via ORAL
  Filled 2021-02-07 (×2): qty 1

## 2021-02-07 MED ORDER — INSULIN ASPART 100 UNIT/ML ~~LOC~~ SOLN
0.0000 [IU] | Freq: Three times a day (TID) | SUBCUTANEOUS | Status: DC
  Administered 2021-02-08 (×2): 5 [IU] via SUBCUTANEOUS

## 2021-02-07 MED ORDER — ENSURE ENLIVE PO LIQD: 237.0000 mL | Freq: Two times a day (BID) | ORAL | Status: DC

## 2021-02-07 MED ORDER — INSULIN GLARGINE 100 UNIT/ML ~~LOC~~ SOLN
10.0000 [IU] | Freq: Every day | SUBCUTANEOUS | Status: DC
  Administered 2021-02-07 – 2021-02-12 (×6): 10 [IU] via SUBCUTANEOUS
  Filled 2021-02-07 (×6): qty 0.1

## 2021-02-07 MED ORDER — STROKE: EARLY STAGES OF RECOVERY BOOK
Freq: Once | Status: AC
  Filled 2021-02-07: qty 1

## 2021-02-07 MED ORDER — ENOXAPARIN SODIUM 40 MG/0.4ML ~~LOC~~ SOLN
40.0000 mg | SUBCUTANEOUS | Status: DC
  Administered 2021-02-07 – 2021-02-11 (×5): 40 mg via SUBCUTANEOUS
  Filled 2021-02-07 (×5): qty 0.4

## 2021-02-07 MED ORDER — MORPHINE SULFATE (PF) 2 MG/ML IV SOLN
2.0000 mg | INTRAVENOUS | Status: DC | PRN
  Administered 2021-02-07 – 2021-02-09 (×11): 2 mg via INTRAVENOUS
  Filled 2021-02-07 (×11): qty 1

## 2021-02-07 MED ORDER — INSULIN ASPART 100 UNIT/ML ~~LOC~~ SOLN
4.0000 [IU] | Freq: Three times a day (TID) | SUBCUTANEOUS | Status: DC
  Administered 2021-02-08 (×2): 4 [IU] via SUBCUTANEOUS

## 2021-02-07 MED ORDER — ATORVASTATIN CALCIUM 40 MG PO TABS
40.0000 mg | ORAL_TABLET | Freq: Every day | ORAL | Status: DC
  Administered 2021-02-07 – 2021-02-08 (×2): 40 mg via ORAL
  Filled 2021-02-07 (×2): qty 1

## 2021-02-07 MED ORDER — KETOROLAC TROMETHAMINE 30 MG/ML IJ SOLN
30.0000 mg | Freq: Once | INTRAMUSCULAR | Status: AC
  Administered 2021-02-08: 30 mg via INTRAVENOUS
  Filled 2021-02-07: qty 1

## 2021-02-07 MED ORDER — TRAZODONE HCL 100 MG PO TABS
100.0000 mg | ORAL_TABLET | Freq: Every day | ORAL | Status: DC
  Filled 2021-02-07 (×2): qty 1

## 2021-02-07 MED ORDER — HYDROMORPHONE HCL 1 MG/ML IJ SOLN
0.5000 mg | Freq: Once | INTRAMUSCULAR | Status: AC
Start: 2021-02-07 — End: 2021-02-07
  Administered 2021-02-07: 0.5 mg via INTRAVENOUS
  Filled 2021-02-07: qty 1

## 2021-02-07 NOTE — H&P (Signed)
History and Physical        Hospital Admission Note Date: 02/07/2021  Patient name: Sheila Guerrero Medical record number: [id number] Date of birth: 05-Sep-1961 Age: 60 y.o. Gender: female  PCP: Donald Prose, MD    Chief Complaint    Chief Complaint  Patient presents with  . Numbness      HPI:   This is a 60 year old female with past medical history of angioedema, hypertension, hyperlipidemia, type 2 diabetes, anxiety, depression, type 2 diabetes, CVA in 2017 who presented to the ED with 4 weeks of left-sided paresthesias with progressive weakness on the left side.  Patient states that she had sudden onset left-sided paresthesias several weeks ago and was seen by her PCP and was initially thought to be neuropathy and was prescribed Lyrica.  She had minimal improvement in symptoms and continued to have worsening neuropathic pain and so was seen by a pain management specialist who was concern for an acute stroke and referred her to neurology.  She was seen by Dr. Krista Blue, neurology, on 3/7 who was concern for a right thalamic infarct but could not get her in for an MRI until next week.  She continued to have left-sided sensory changes of her face, body, upper and lower extremity and head new onset right lower extremity sciatica with discomfort to right knee.  Admits to some left ankle weakness as well.  Denies slurred speech Or facial droop.  Denies tobacco use.  After her stroke in 2017 she reports that she gets lost easily in well known areas and difficulty finding objects.  Otherwise no other complaints   ED Course: Afebrile, tachycardic, hemodynamically stable, on room air. Notable Labs: Sodium 139, K3.4, glucose 298, BUN 18, creatinine 0.53, WBC 9.0, Hb 15.7, platelets 301, beta-hCG 6.7, UDS positive for opiates (received Dilaudid prior to UDS). Notable Imaging: MRI brain-small acute to  early subacute infarct in the right thalamus/posterior limb of the internal capsule, small subacute left occipital lobe infarct, moderately advanced chronic small vessel ischemic disease progressed from 2018 and numerous chronic microhemorrhages consistent with chronic hypertension. Patient received dilaudid.    Vitals:   02/07/21 1600 02/07/21 1630  BP: (!) 127/98 (!) 160/77  Pulse: (!) 115 (!) 108  Resp: 18 18  Temp:    SpO2: 96% 96%     Review of Systems:  Review of Systems  All other systems reviewed and are negative.   Medical/Social/Family History   Past Medical History: Past Medical History:  Diagnosis Date  . Angioedema   . Anxiety   . Asthma   . Depression   . Diabetes mellitus, type II ([place])   . Dyslipidemia   . History of CVA (cerebrovascular accident)   . HTN (hypertension) 2013  . Panic attacks   . Pneumonia   . Situational stress     Past Surgical History:  Procedure Laterality Date  . CHOLECYSTECTOMY  2010    Medications: Prior to Admission medications   Medication Sig Start Date End Date Taking? Authorizing Provider  atorvastatin (LIPITOR) 20 MG tablet Take 20 mg by mouth daily. 01/21/21   [provider]  clopidogrel (PLAVIX) 75 MG tablet Take 1 tablet (75 mg total) by mouth daily. 02/02/21  Marcial Pacas, MD  lisinopril (ZESTRIL) 20 MG tablet Take 20 mg by mouth daily. 01/21/21   [provider]  metFORMIN (GLUCOPHAGE-XR) 500 MG 24 hr tablet Take 4 tablets by mouth daily. 01/29/21   [provider]  pregabalin (LYRICA) 75 MG capsule Take 75 mg by mouth 2 (two) times daily. 01/21/21   [provider]  traZODone (DESYREL) 100 MG tablet Take 100 mg by mouth at bedtime. 01/21/21   [provider]    Allergies:   Allergies  Allergen Reactions  . Sulfa Antibiotics Swelling  . Dulaglutide     Other reaction(s): rash  . Empagliflozin     Other reaction(s): recurrent yeast  . Iodinated Diagnostic Agents      Other reaction(s): angioedema with CT dye  . Latex Rash    Social History:  reports that she has never smoked. She has never used smokeless tobacco. She reports current alcohol use. She reports that she does not use drugs.  Family History: Family History  Problem Relation Age of Onset  . Hypertension Mother   . Other Mother        blood disorder  . Hypertension Father   . Stroke Father   . Diabetes Maternal Aunt      Objective   Physical Exam: Blood pressure (!) 160/77, pulse (!) 108, temperature 98.2 F (36.8 C), temperature source Oral, resp. rate 18, height 5\' 5"  (1.651 m), weight 82.6 kg, last menstrual period 11/25/2011, SpO2 96 %.  Physical Exam Vitals and nursing note reviewed.  Constitutional:      Appearance: Normal appearance.  HENT:     Head: Normocephalic and atraumatic.  Eyes:     Conjunctiva/sclera: Conjunctivae normal.  Cardiovascular:     Rate and Rhythm: Normal rate and regular rhythm.  Pulmonary:     Effort: Pulmonary effort is normal.     Breath sounds: Normal breath sounds.  Abdominal:     General: Abdomen is flat.     Palpations: Abdomen is soft.  Musculoskeletal:     Right lower leg: No edema.     Left lower leg: No edema.  Skin:    Coloration: Skin is not jaundiced or pale.  Neurological:     Mental Status: She is alert and oriented to person, place, and time.     Sensory: Sensory deficit present.     Comments: Left Trigeminal nerve V1-V3 decreased sensation/paresthesias Sensory changes of left side of body Weakness of LLE knee extension Decreased DTRs on left compared to right  Psychiatric:        Mood and Affect: Mood normal.        Behavior: Behavior normal.     LABS on Admission: I have personally reviewed all the labs and imaging below    Basic Metabolic Panel: Recent Labs  Lab 02/07/21 1145  NA 139  K 3.4*  CL 105  CO2 25  GLUCOSE 298*  BUN 18  CREATININE 0.53  CALCIUM 9.2   Liver Function Tests: Recent Labs  Lab  02/07/21 1145  AST 16  ALT 21  ALKPHOS 71  BILITOT 0.8  PROT 6.8  ALBUMIN 3.8   No results for input(s): LIPASE, AMYLASE in the last 168 hours. No results for input(s): AMMONIA in the last 168 hours. CBC: Recent Labs  Lab 02/07/21 1145  WBC 9.0  NEUTROABS 6.0  HGB 15.7*  HCT 45.8  MCV 86.1  PLT 301   Cardiac Enzymes: No results for input(s): CKTOTAL, CKMB, CKMBINDEX, TROPONINI in  the last 168 hours. BNP: Invalid input(s): POCBNP CBG: No results for input(s): GLUCAP in the last 168 hours.  Radiological Exams on Admission:  MR Brain Wo Contrast (neuro protocol)  Result Date: 02/07/2021 CLINICAL DATA:  Left-sided weakness for 4 weeks. EXAM: MRI HEAD WITHOUT CONTRAST TECHNIQUE: Multiplanar, multiecho pulse sequences of the brain and surrounding structures were obtained without intravenous contrast. COMPARISON:  03/02/2017 FINDINGS: Brain: There is an approximately 1.5 cm acute to early subacute infarct involving the lateral aspect of the right thalamus and posterior limb of the right internal capsule. There is also likely a small subacute infarct in the left occipital lobe. There are multiple chronic microhemorrhages scattered throughout the cerebrum and cerebellum including prominent bi thalamic involvement as well as in the pons with the distribution suggestive of chronic hypertension. T2 hyperintensities in the cerebral white matter bilaterally have progressed and are nonspecific but compatible with moderately age advanced chronic small vessel ischemic disease given the patient's vascular risk factors. There are chronic lacunar infarcts in the deep cerebral white matter bilaterally. The ventricles and sulci are within normal limits for age. No mass, midline shift, or extra-axial fluid collection is identified. Vascular: Major intracranial vascular flow voids are preserved. Skull and upper cervical spine: Unremarkable bone marrow signal. Sinuses/Orbits: Unremarkable orbits. Paranasal  sinuses and mastoid air cells are clear. Other: None. IMPRESSION: 1. Small acute to early subacute infarct in the right thalamus/posterior limb of internal capsule. 2. Small subacute left occipital lobe infarct. 3. Moderately advanced chronic small vessel ischemic disease, progressed from 2018. 4. Numerous chronic microhemorrhages consistent with chronic hypertension. Electronically Signed   By: Logan Bores M.D.   On: 02/07/2021 13:28      EKG: sinus tachycardia, prolonged QT interval   A & P   Principal Problem:   Right thalamic infarction [facility]) Active Problems:   Type 2 diabetes mellitus with hyperlipidemia (H[facility])   Paresthesia   Essential hypertension   1. Acute/subacute right thalamic/posterior limb of the internal capsule infarct and subacute left occipital lobe infarct a. Symptom onset 4 weeks ago, no tPA given b. Continue Plavix c. Increase statin d. Echo e. Patient has angioedema with CT contrast -> carotid ultrasound and MRA head after discussion with neuro f. Discussed with neurology, will see in consult tomorrow g. HbA1c h. Lipid panel i. PT eval  2. Right lower extremity sciatica  a. Continue Lyrica  3. Hypertension a. Continue home lisinopril  4. Diabetes with hyperglycemia a. Check HbA1c b. Holding Metformin c. Weight-based basal bolus insulin  5. Anxiety/depression a. Continue trazodone     DVT prophylaxis: Lovenox   Code Status: Full Code  Diet: Heart healthy/carb modified Family Communication: Admission, patients condition and plan of care including tests being ordered have been discussed with the patient who indicates understanding and agrees with the plan and Code Status. Patient's husband was updated  Disposition Plan: The appropriate patient status for this patient is OBSERVATION. Observation status is judged to be reasonable and necessary in order to provide the required intensity of service to ensure the patient's safety. The patient's  presenting symptoms, physical exam findings, and initial radiographic and laboratory data in the context of their medical condition is felt to place them at decreased risk for further clinical deterioration. Furthermore, it is anticipated that the patient will be medically stable for discharge from the hospital within 2 midnights of admission. The following factors support the patient status of observation.   " The patient's presenting symptoms include sensory changes, left  lower extremity weakness. " The physical exam findings include left lower extremity weakness, sensory changes left side of body. " The initial radiographic and laboratory data are concerning for acute/subacute stroke.   Consultants  . Neurology  Procedures  . None  Time Spent on Admission: 63 minutes    Harold Hedge, DO [facility]  02/07/2021, 5:17 PM

## 2021-02-07 NOTE — ED Notes (Signed)
Patient is still gone for testing.

## 2021-02-07 NOTE — Progress Notes (Signed)
   02/07/21 2000  Assess: MEWS Score  Temp 98.2 F (36.8 C)  BP (!) 160/88  Pulse Rate (!) 105  Resp (!) 24  SpO2 100 %  Assess: MEWS Score  MEWS Temp 0  MEWS Systolic 0  MEWS Pulse 1  MEWS RR 1  MEWS LOC 0  MEWS Score 2  MEWS Score Color Yellow  Assess: if the MEWS score is Yellow or Red  Were vital signs taken at a resting state? No  Focused Assessment No change from prior assessment  Early Detection of Sepsis Score *See Row Information* Low  MEWS guidelines implemented *See Row Information* Yes  Treat  MEWS Interventions Other (Comment) (helped patient calm down to help with anxiety)  Pain Scale 0-10  Pain Score 5  Pain Type Acute pain  Pain Location Buttocks  Pain Orientation Right  Pain Descriptors / Indicators Shooting  Pain Frequency Constant  Patients Stated Pain Goal 3  Pain Intervention(s) Repositioned;Cold applied;Distraction  Take Vital Signs  Increase Vital Sign Frequency  Yellow: Q 2hr X 2 then Q 4hr X 2, if remains yellow, continue Q 4hrs  Escalate  MEWS: Escalate Yellow: discuss with charge nurse/RN and consider discussing with provider and RRT  Notify: Charge Nurse/RN  Name of Charge Nurse/RN Notified Pam, RN  Date Charge Nurse/RN Notified 02/07/21  Time Charge Nurse/RN Notified 2300  Document  Patient Outcome Stabilized after interventions  Progress note created (see row info) Yes

## 2021-02-07 NOTE — ED Provider Notes (Signed)
Sheila Guerrero Provider Note   CSN: [id number] Arrival date & time: 02/07/21  1042     History Chief Complaint  Patient presents with  . Numbness    Sheila Guerrero is a 60 y.o. female with penitent past history of type 2 diabetes uncontrolled, hypertension, history of CVA in 2018 that presents the emerge department today for 4 weeks of left-sided numbness and tingling with progressive weakness on that side.  Patient states that when this occurred she went to her PCP who provided her with Lyrica for presumed neuropathy, states that this did not help and she went to her neurologist, Dr. Krista Blue who ordered a MRI which is scheduled for this Tuesday.  Patient states that she originally woke up on February 14 with sudden onset left face, arm, leg numbness and tingling and weakness with some gait abnormality.   According to Dr. Rhea Belton note, MRI of the brain in 2018 was consistent with chronic small vessel disease with evidence of right dominant chronic cerebral microhemorrhage and chronic lacunar infarct.  EEG in 2018 was normal.  Dr. Krista Blue was mostly worried about the possibility of a right thalamus stroke,  started her on Plavix and order the MRI outpatient.  Patient denies any fever, chills, nausea vomiting chest pain headache vision changes trauma to the brain.  Patient also states that because she has been compensating from the weakness of her left side her right side hip started hurting flaring up her sciatica.  Patient states that it does feel like sciatica pain, starting from the right hip and radiating down into her leg.  Has been taking her Lyrica which has not been helping.  Is not on any narcotics.  Denies any trauma to this area. No back pain.     HPI     Past Medical History:  Diagnosis Date  . Angioedema   . Anxiety   . Asthma   . Depression   . Diabetes mellitus, type II ([place])   . Dyslipidemia   . History of CVA (cerebrovascular accident)   .  HTN (hypertension) 2013  . Panic attacks   . Pneumonia   . Situational stress     Patient Active Problem List   Diagnosis Date Noted  . Right thalamic infarction ([place]) 02/07/2021  . Essential hypertension 02/07/2021  . Paresthesia 02/02/2021  . Gait abnormality 02/02/2021  . Confusion 05/08/2018  . Stroke ([place]) 03/17/2017  . Type 2 diabetes mellitus with hyperlipidemia ([place]) 03/17/2017  . Confusion state 02/16/2017  . Angioedema 02/16/2017  . Panic 02/04/2017  . Adjustment disorder with anxious mood 02/04/2017  . Sinusitis, chronic 12/28/2011  . Rhinitis, allergic 12/28/2011  . Extrinsic asthma, unspecified 12/07/2011    Past Surgical History:  Procedure Laterality Date  . CHOLECYSTECTOMY  2010     OB History   No obstetric history on file.     Family History  Problem Relation Age of Onset  . Hypertension Mother   . Other Mother        blood disorder  . Hypertension Father   . Stroke Father   . Diabetes Maternal Aunt     Social History   Tobacco Use  . Smoking status: Never Smoker  . Smokeless tobacco: Never Used  Substance Use Topics  . Alcohol use: Yes    Comment: rarely  . Drug use: Never    Home Medications Prior to Admission medications   Medication Sig Start Date End Date Taking? Authorizing Provider  acetaminophen (TYLENOL) 500  MG tablet Take 1,000 mg by mouth every 6 (six) hours as needed for moderate pain.   Yes [provider]  Ascorbic Acid (VITAMIN C) 500 MG CAPS Take 500 mg by mouth daily.   Yes [provider]  atorvastatin (LIPITOR) 20 MG tablet Take 20 mg by mouth daily. 01/21/21  Yes [provider]  CALCIUM-VITAMIN D PO Take 1 tablet by mouth daily.   Yes [provider]  Cholecalciferol (VITAMIN D-3) 5000 UNIT/ML LIQD Take 1 tablet by mouth daily.   Yes [provider]  CINNAMON PO Take 1 tablet by mouth daily.   Yes [provider]  clopidogrel (PLAVIX) 75 MG tablet Take 1 tablet  (75 mg total) by mouth daily. 02/02/21  Yes Marcial Pacas, MD  EPINEPHrine 0.3 mg/0.3 mL IJ SOAJ injection Inject 0.3 mg into the muscle daily as needed.   Yes [provider]  Flaxseed, Linseed, (FLAX SEED OIL) 1000 MG CAPS Take 1 capsule by mouth daily.   Yes [provider]  lisinopril (ZESTRIL) 20 MG tablet Take 20 mg by mouth daily. 01/21/21  Yes [provider]  MAGNESIUM GLUCONATE PO Take 1 tablet by mouth daily.   Yes [provider]  melatonin 3 MG TABS tablet Take 3 mg by mouth daily as needed. 03/02/16  Yes [provider]  metFORMIN (GLUCOPHAGE-XR) 500 MG 24 hr tablet Take 2,000 mg by mouth daily. 01/29/21  Yes [provider]  OIL OF OREGANO PO Take 1 capsule by mouth daily.   Yes [provider]  OLIVE LEAF EXTRACT PO Take 1 capsule by mouth daily.   Yes [provider]  omega-3 fish oil (MAXEPA) 1000 MG CAPS capsule Take 1 capsule by mouth daily.   Yes [provider]  pregabalin (LYRICA) 75 MG capsule Take 150 mg by mouth 2 (two) times daily. 01/21/21  Yes [provider]  sodium chloride (OCEAN) 0.65 % SOLN nasal spray Place 1 spray into both nostrils daily as needed for congestion.   Yes [provider]  Tragacanth (ASTRAGALUS ROOT) POWD Take 1 Dose by mouth daily.   Yes [provider]  traMADol (ULTRAM) 50 MG tablet Take 50 mg by mouth every 12 (twelve) hours as needed for severe pain. 02/02/21  Yes [provider]  Turmeric 500 MG CAPS Take 1 tablet by mouth daily.   Yes [provider]  traZODone (DESYREL) 100 MG tablet Take 100 mg by mouth at bedtime. 01/21/21   [provider]    Allergies    Sulfa antibiotics, Dulaglutide, Empagliflozin, Iodinated diagnostic agents, and Latex  Review of Systems   Review of Systems  Constitutional: Negative for chills, diaphoresis, fatigue and fever.  HENT: Negative for congestion, sore throat and trouble swallowing.    Eyes: Negative for pain and visual disturbance.  Respiratory: Negative for cough, shortness of breath and wheezing.   Cardiovascular: Negative for chest pain, palpitations and leg swelling.  Gastrointestinal: Negative for abdominal distention, abdominal pain, diarrhea, nausea and vomiting.  Genitourinary: Negative for difficulty urinating.  Musculoskeletal: Positive for arthralgias. Negative for back pain, neck pain and neck stiffness.  Skin: Negative for pallor.  Neurological: Positive for weakness and numbness. Negative for dizziness, speech difficulty and headaches.  Psychiatric/Behavioral: Negative for confusion.    Physical Exam Updated Vital Signs BP (!) 160/77   Pulse (!) 108   Temp 98.2 F (36.8 C) (Oral)   Resp 18   Ht 5\' 5"  (1.651 m)   Wt  82.6 kg   LMP 11/25/2011   SpO2 96%   BMI 30.29 kg/m   Physical Exam Constitutional:      General: She is not in acute distress.    Appearance: Normal appearance. She is not ill-appearing, toxic-appearing or diaphoretic.  HENT:     Mouth/Throat:     Mouth: Mucous membranes are moist.     Pharynx: Oropharynx is clear.  Eyes:     General: No scleral icterus.    Extraocular Movements: Extraocular movements intact.     Pupils: Pupils are equal, round, and reactive to light.  Cardiovascular:     Rate and Rhythm: Normal rate and regular rhythm.     Pulses: Normal pulses.     Heart sounds: Normal heart sounds.  Pulmonary:     Effort: Pulmonary effort is normal. No respiratory distress.     Breath sounds: Normal breath sounds. No stridor. No wheezing, rhonchi or rales.  Chest:     Chest wall: No tenderness.  Abdominal:     General: Abdomen is flat. There is no distension.     Palpations: Abdomen is soft.     Tenderness: There is no abdominal tenderness. There is no guarding or rebound.  Musculoskeletal:        General: No swelling or tenderness. Normal range of motion.     Cervical back: Normal range of motion and neck  supple. No rigidity.     Right lower leg: No edema.     Left lower leg: No edema.  Skin:    General: Skin is warm and dry.     Capillary Refill: Capillary refill takes less than 2 seconds.     Coloration: Skin is not pale.  Neurological:     General: No focal deficit present.     Mental Status: She is alert and oriented to person, place, and time.     Comments: Alert. Clear speech. No facial droop. CNIII-XII grossly intact.  Patient does have some subjective sensation changes on the left side, no objective sensation changes.  Strength on left side upper and lower might be slightly less than right side, however not substantially.. Normal finger to nose bilaterally. Negative pronator drift.  Questionably positive Romberg.  Gait is antalgic, is not dragging her foot.  Is not ataxic.  Psychiatric:        Mood and Affect: Mood normal.        Behavior: Behavior normal.     ED Results / Procedures / Treatments   Labs (all labs ordered are listed, but only abnormal results are displayed) Labs Reviewed  CBC - Abnormal; Notable for the following components:      Result Value   RBC 5.32 (*)    Hemoglobin 15.7 (*)    All other components within normal limits  COMPREHENSIVE METABOLIC PANEL - Abnormal; Notable for the following components:   Potassium 3.4 (*)    Glucose, Bld 298 (*)    All other components within normal limits  RAPID URINE DRUG SCREEN, HOSP PERFORMED - Abnormal; Notable for the following components:   Opiates POSITIVE (*)    All other components within normal limits  URINALYSIS, ROUTINE W REFLEX MICROSCOPIC - Abnormal; Notable for the following components:   APPearance HAZY (*)    Glucose, UA 150 (*)    Ketones, ur 5 (*)    Leukocytes,Ua MODERATE (*)    All other components within normal limits  GLUCOSE, CAPILLARY - Abnormal; Notable for the following components:   Glucose-Capillary 309 (*)  All other components within normal limits  I-STAT BETA HCG BLOOD, ED ([facility], [facility],  [facility] ONLY) - Abnormal; Notable for the following components:   I-stat hCG, quantitative 6.7 (*)    All other components within normal limits  RESP PANEL BY RT-PCR (FLU A&B, COVID) ARPGX2  PROTIME-INR  APTT  DIFFERENTIAL  ETHANOL  HIV ANTIBODY (ROUTINE TESTING W REFLEX)  HEMOGLOBIN A1C  LIPID PANEL  CBC  CREATININE, SERUM    EKG EKG Interpretation  Date/Time:  Saturday February 07 2021 11:58:57 EST Ventricular Rate:  108 PR Interval:    QRS Duration: 91 QT Interval:  371 QTC Calculation: 498 R Axis:   75 Text Interpretation: Sinus tachycardia Borderline T abnormalities, inferior leads Borderline prolonged QT interval No significant change since last tracing Confirmed by Dorie Rank [phone]) on 02/07/2021 12:00:45 PM   Radiology MR Brain Wo Contrast (neuro protocol)  Result Date: 02/07/2021 CLINICAL DATA:  Left-sided weakness for 4 weeks. EXAM: MRI HEAD WITHOUT CONTRAST TECHNIQUE: Multiplanar, multiecho pulse sequences of the brain and surrounding structures were obtained without intravenous contrast. COMPARISON:  03/02/2017 FINDINGS: Brain: There is an approximately 1.5 cm acute to early subacute infarct involving the lateral aspect of the right thalamus and posterior limb of the right internal capsule. There is also likely a small subacute infarct in the left occipital lobe. There are multiple chronic microhemorrhages scattered throughout the cerebrum and cerebellum including prominent bi thalamic involvement as well as in the pons with the distribution suggestive of chronic hypertension. T2 hyperintensities in the cerebral white matter bilaterally have progressed and are nonspecific but compatible with moderately age advanced chronic small vessel ischemic disease given the patient's vascular risk factors. There are chronic lacunar infarcts in the deep cerebral white matter bilaterally. The ventricles and sulci are within normal limits for age. No mass, midline shift, or extra-axial fluid  collection is identified. Vascular: Major intracranial vascular flow voids are preserved. Skull and upper cervical spine: Unremarkable bone marrow signal. Sinuses/Orbits: Unremarkable orbits. Paranasal sinuses and mastoid air cells are clear. Other: None. IMPRESSION: 1. Small acute to early subacute infarct in the right thalamus/posterior limb of internal capsule. 2. Small subacute left occipital lobe infarct. 3. Moderately advanced chronic small vessel ischemic disease, progressed from 2018. 4. Numerous chronic microhemorrhages consistent with chronic hypertension. Electronically Signed   By: Logan Bores M.D.   On: 02/07/2021 13:28    Procedures Procedures   Medications Ordered in ED Medications  pregabalin (LYRICA) capsule 75 mg (75 mg Oral Given 02/07/21 1542)  traZODone (DESYREL) tablet 100 mg (has no administration in time range)  clopidogrel (PLAVIX) tablet 75 mg (has no administration in time range)   stroke: mapping our early stages of recovery book (has no administration in time range)  acetaminophen (TYLENOL) tablet 650 mg (has no administration in time range)    Or  acetaminophen (TYLENOL) 160 MG/5ML solution 650 mg (has no administration in time range)    Or  acetaminophen (TYLENOL) suppository 650 mg (has no administration in time range)  enoxaparin (LOVENOX) injection 40 mg (has no administration in time range)  atorvastatin (LIPITOR) tablet 40 mg (has no administration in time range)  lisinopril (ZESTRIL) tablet 20 mg (has no administration in time range)  insulin glargine (LANTUS) injection 10 Units (has no administration in time range)  insulin aspart (novoLOG) injection 4 Units (has no administration in time range)  insulin aspart (novoLOG) injection 0-9 Units (has no administration in time range)  potassium chloride SA (KLOR-CON) CR tablet 20  mEq (has no administration in time range)  HYDROmorphone (DILAUDID) injection 0.5 mg (0.5 mg Intravenous Given 02/07/21 1158)    ED  Course  I have reviewed the triage vital signs and the nursing notes.  Pertinent labs & imaging results that were available during my care of the patient were reviewed by me and considered in my medical decision making (see chart for details).    MDM Rules/Calculators/A&P                          Cailen Texeira is a 60 y.o. female with  Pertinent history of type 2 diabetes, uncontrolled, hypertension, history of CVA in 2018 that presents the emerge department today for 4 weeks of left-sided numbness and tingling with progressive weakness on that side.  Patient does have some weakness on the left side compared to the right side with some subjective numbness on the left side. Pt is on Plavic.  MRI does show acute to subacute right thalamus. Spoke to Dr. Curly Shores will consult, will admit to medicine.  Spoke to Dr. Neysa Bonito who will admit patient. Pt updated on findings and agreeable for plan.   The patient appears reasonably stabilized for admission considering the current resources, flow, and capabilities available in the ED at this time, and I doubt any other [facility] requiring further screening and/or treatment in the ED prior to admission.  I discussed this case with my attending physician who cosigned this note including patient's presenting symptoms, physical exam, and planned diagnostics and interventions. Attending physician stated agreement with plan or made changes to plan which were implemented.      Final Clinical Impression(s) / ED Diagnoses Final diagnoses:  Infarction of right thalamus [facility])    Rx / DC Orders ED Discharge Orders    None       Alfredia Client, PA-C 02/07/21 1750    Dorie Rank, MD 02/08/21 [phone]

## 2021-02-07 NOTE — ED Notes (Signed)
Patient was made aware we need a urine sample however she is leaving for her MRI. Will attempt to collect when she is brought back.

## 2021-02-07 NOTE — ED Triage Notes (Signed)
Patient c/o hip pain, buttocks pain shooting leg pain that started approximately 3 days ago. She reports a history of sciatica. She states she took Tramadol and Lyrica for the pain, numbness/tingling around 0300 w/ no relief. Reports a history of two silent strokes in 2017 and was diagnosed with another stroke a few weeks ago. She states she has been "walking funny" for the last few weeks. She states she has an MRI scheduled next Tuesday. Denies any other symptoms. Follows with [facility] neurology. Hx DM2 and HTN.

## 2021-02-07 NOTE — ED Notes (Signed)
Patient will be transported to MRI first then returned to ED 22. She will then be transported upstairs to room 1428

## 2021-02-08 ENCOUNTER — Observation Stay (HOSPITAL_COMMUNITY): Payer: 59

## 2021-02-08 DIAGNOSIS — F32A Depression, unspecified: Secondary | ICD-10-CM | POA: Diagnosis present

## 2021-02-08 DIAGNOSIS — R270 Ataxia, unspecified: Secondary | ICD-10-CM | POA: Diagnosis present

## 2021-02-08 DIAGNOSIS — Z6830 Body mass index (BMI) 30.0-30.9, adult: Secondary | ICD-10-CM | POA: Diagnosis not present

## 2021-02-08 DIAGNOSIS — I672 Cerebral atherosclerosis: Secondary | ICD-10-CM | POA: Diagnosis present

## 2021-02-08 DIAGNOSIS — R7881 Bacteremia: Secondary | ICD-10-CM | POA: Diagnosis not present

## 2021-02-08 DIAGNOSIS — E785 Hyperlipidemia, unspecified: Secondary | ICD-10-CM | POA: Diagnosis present

## 2021-02-08 DIAGNOSIS — I639 Cerebral infarction, unspecified: Secondary | ICD-10-CM | POA: Diagnosis present

## 2021-02-08 DIAGNOSIS — E440 Moderate protein-calorie malnutrition: Secondary | ICD-10-CM | POA: Diagnosis present

## 2021-02-08 DIAGNOSIS — I119 Hypertensive heart disease without heart failure: Secondary | ICD-10-CM | POA: Diagnosis present

## 2021-02-08 DIAGNOSIS — E876 Hypokalemia: Secondary | ICD-10-CM | POA: Diagnosis not present

## 2021-02-08 DIAGNOSIS — R202 Paresthesia of skin: Secondary | ICD-10-CM | POA: Diagnosis not present

## 2021-02-08 DIAGNOSIS — Z8673 Personal history of transient ischemic attack (TIA), and cerebral infarction without residual deficits: Secondary | ICD-10-CM | POA: Diagnosis not present

## 2021-02-08 DIAGNOSIS — I330 Acute and subacute infective endocarditis: Secondary | ICD-10-CM | POA: Diagnosis present

## 2021-02-08 DIAGNOSIS — Z8249 Family history of ischemic heart disease and other diseases of the circulatory system: Secondary | ICD-10-CM | POA: Diagnosis not present

## 2021-02-08 DIAGNOSIS — I10 Essential (primary) hypertension: Secondary | ICD-10-CM | POA: Diagnosis not present

## 2021-02-08 DIAGNOSIS — E1169 Type 2 diabetes mellitus with other specified complication: Secondary | ICD-10-CM | POA: Diagnosis present

## 2021-02-08 DIAGNOSIS — Z823 Family history of stroke: Secondary | ICD-10-CM | POA: Diagnosis not present

## 2021-02-08 DIAGNOSIS — I43 Cardiomyopathy in diseases classified elsewhere: Secondary | ICD-10-CM | POA: Diagnosis present

## 2021-02-08 DIAGNOSIS — I081 Rheumatic disorders of both mitral and tricuspid valves: Secondary | ICD-10-CM | POA: Diagnosis present

## 2021-02-08 DIAGNOSIS — Z9049 Acquired absence of other specified parts of digestive tract: Secondary | ICD-10-CM | POA: Diagnosis not present

## 2021-02-08 DIAGNOSIS — Z20822 Contact with and (suspected) exposure to covid-19: Secondary | ICD-10-CM | POA: Diagnosis present

## 2021-02-08 DIAGNOSIS — I76 Septic arterial embolism: Secondary | ICD-10-CM | POA: Diagnosis present

## 2021-02-08 DIAGNOSIS — E1140 Type 2 diabetes mellitus with diabetic neuropathy, unspecified: Secondary | ICD-10-CM | POA: Diagnosis present

## 2021-02-08 DIAGNOSIS — I6381 Other cerebral infarction due to occlusion or stenosis of small artery: Secondary | ICD-10-CM | POA: Diagnosis present

## 2021-02-08 DIAGNOSIS — I6389 Other cerebral infarction: Secondary | ICD-10-CM | POA: Diagnosis not present

## 2021-02-08 DIAGNOSIS — M5431 Sciatica, right side: Secondary | ICD-10-CM | POA: Diagnosis present

## 2021-02-08 DIAGNOSIS — E1165 Type 2 diabetes mellitus with hyperglycemia: Secondary | ICD-10-CM | POA: Diagnosis present

## 2021-02-08 DIAGNOSIS — I63511 Cerebral infarction due to unspecified occlusion or stenosis of right middle cerebral artery: Secondary | ICD-10-CM | POA: Diagnosis present

## 2021-02-08 DIAGNOSIS — F410 Panic disorder [episodic paroxysmal anxiety] without agoraphobia: Secondary | ICD-10-CM | POA: Diagnosis present

## 2021-02-08 LAB — LIPID PANEL
Cholesterol: 130 mg/dL (ref 0–200)
HDL: 33 mg/dL — ABNORMAL LOW (ref >40)
LDL Cholesterol: 55 mg/dL (ref 0–99)
Total CHOL/HDL Ratio: 3.9 RATIO
Triglycerides: 211 mg/dL — ABNORMAL HIGH (ref <150)
VLDL: 42 mg/dL — ABNORMAL HIGH (ref 0–40)

## 2021-02-08 LAB — GLUCOSE, CAPILLARY
Glucose-Capillary: 269 mg/dL — ABNORMAL HIGH (ref 70–99)
Glucose-Capillary: 270 mg/dL — ABNORMAL HIGH (ref 70–99)
Glucose-Capillary: 239 mg/dL — ABNORMAL HIGH (ref 70–99)
Glucose-Capillary: 178 mg/dL — ABNORMAL HIGH (ref 70–99)

## 2021-02-08 LAB — HIV ANTIBODY (ROUTINE TESTING W REFLEX): HIV Screen 4th Generation wRfx: NONREACTIVE

## 2021-02-08 LAB — ECHOCARDIOGRAM COMPLETE
Area-P 1/2: 4.49 cm2
Calc EF: 41.5 %
Height: 65 in
S' Lateral: 3.6 cm
Single Plane A2C EF: 43.5 %
Single Plane A4C EF: 40.7 %
Weight: 2912 oz

## 2021-02-08 LAB — CK: Total CK: 40 U/L (ref 38–234)

## 2021-02-08 MED ORDER — OMEGA-3 FISH OIL 1000 MG PO CAPS: 1.0000 | ORAL_CAPSULE | Freq: Every day | ORAL | Status: DC

## 2021-02-08 MED ORDER — INSULIN ASPART 100 UNIT/ML ~~LOC~~ SOLN
0.0000 [IU] | Freq: Every day | SUBCUTANEOUS | Status: DC
  Administered 2021-02-10 – 2021-02-11 (×2): 4 [IU] via SUBCUTANEOUS

## 2021-02-08 MED ORDER — TRAMADOL HCL 50 MG PO TABS
50.0000 mg | ORAL_TABLET | Freq: Four times a day (QID) | ORAL | Status: DC | PRN
Start: 2021-02-08 — End: 2021-02-11
  Administered 2021-02-08 – 2021-02-11 (×9): 50 mg via ORAL
  Filled 2021-02-08 (×10): qty 1

## 2021-02-08 MED ORDER — PREGABALIN 75 MG PO CAPS
150.0000 mg | ORAL_CAPSULE | Freq: Two times a day (BID) | ORAL | Status: DC
  Administered 2021-02-08 – 2021-02-12 (×8): 150 mg via ORAL
  Filled 2021-02-08 (×8): qty 2

## 2021-02-08 MED ORDER — INSULIN ASPART 100 UNIT/ML ~~LOC~~ SOLN
0.0000 [IU] | Freq: Three times a day (TID) | SUBCUTANEOUS | Status: DC
  Administered 2021-02-08: 5 [IU] via SUBCUTANEOUS
  Administered 2021-02-09: 8 [IU] via SUBCUTANEOUS
  Administered 2021-02-09: 15 [IU] via SUBCUTANEOUS
  Administered 2021-02-09: 2 [IU] via SUBCUTANEOUS
  Administered 2021-02-10: 15 [IU] via SUBCUTANEOUS
  Administered 2021-02-10: 8 [IU] via SUBCUTANEOUS
  Administered 2021-02-11: 11 [IU] via SUBCUTANEOUS
  Administered 2021-02-11 – 2021-02-12 (×5): 8 [IU] via SUBCUTANEOUS

## 2021-02-08 MED ORDER — EPINEPHRINE 0.3 MG/0.3ML IJ SOAJ
0.3000 mg | Freq: Every day | INTRAMUSCULAR | Status: DC | PRN
  Filled 2021-02-08: qty 0.6

## 2021-02-08 MED ORDER — CARVEDILOL 3.125 MG PO TABS
3.1250 mg | ORAL_TABLET | Freq: Two times a day (BID) | ORAL | Status: DC
  Administered 2021-02-08 – 2021-02-12 (×9): 3.125 mg via ORAL
  Filled 2021-02-08 (×9): qty 1

## 2021-02-08 MED ORDER — OMEGA-3-ACID ETHYL ESTERS 1 G PO CAPS
1.0000 g | ORAL_CAPSULE | Freq: Every day | ORAL | Status: DC
  Administered 2021-02-08 – 2021-02-12 (×5): 1 g via ORAL
  Filled 2021-02-08 (×5): qty 1

## 2021-02-08 MED ORDER — ATORVASTATIN CALCIUM 80 MG PO TABS
80.0000 mg | ORAL_TABLET | Freq: Every day | ORAL | Status: DC
Start: 2021-02-09 — End: 2021-02-12
  Administered 2021-02-09 – 2021-02-12 (×4): 80 mg via ORAL
  Filled 2021-02-08 (×4): qty 1

## 2021-02-08 MED ORDER — PERFLUTREN LIPID MICROSPHERE
1.0000 mL | INTRAVENOUS | Status: DC | PRN
  Administered 2021-02-08: 2 mL via INTRAVENOUS
  Filled 2021-02-08: qty 10

## 2021-02-08 MED ORDER — INSULIN ASPART 100 UNIT/ML ~~LOC~~ SOLN
4.0000 [IU] | Freq: Three times a day (TID) | SUBCUTANEOUS | Status: DC
  Administered 2021-02-08 – 2021-02-12 (×12): 4 [IU] via SUBCUTANEOUS

## 2021-02-08 MED ORDER — ASPIRIN EC 81 MG PO TBEC
81.0000 mg | DELAYED_RELEASE_TABLET | Freq: Every day | ORAL | Status: DC
  Administered 2021-02-08 – 2021-02-12 (×5): 81 mg via ORAL
  Filled 2021-02-08 (×5): qty 1

## 2021-02-08 MED ORDER — INSULIN ASPART 100 UNIT/ML ~~LOC~~ SOLN: 6.0000 [IU] | Freq: Three times a day (TID) | SUBCUTANEOUS | Status: DC

## 2021-02-08 NOTE — Evaluation (Signed)
Occupational Therapy Evaluation Patient Details Name: Sheila Guerrero MRN: [id number] DOB: [birth year] Today's Date: 02/08/2021    History of Present Illness This is a 60 year old female with past medical history of angioedema, hypertension, hyperlipidemia, type 2 diabetes, anxiety, depression, type 2 diabetes, CVA in 2017 who presented to the ED with 4 weeks of left-sided paresthesias with progressive weakness on the left side.  Patient states that she had sudden onset left-sided paresthesias several weeks ago and was seen by her PCP and was initially thought to be neuropathy and was prescribed Lyrica.  She had minimal improvement in symptoms and continued to have worsening neuropathic pain and so was seen by a pain management specialist who was concern for an acute stroke and referred her to neurology.  She was seen by Dr. Krista Blue, neurology, on 3/7 who was concern for a right thalamic infarct but could not get her in for an MRI until next week.  She continued to have left-sided sensory changes of her face, body, upper and lower extremity and head new onset right lower extremity sciatica with discomfort to right knee.  Admits to some left ankle weakness as well.   After her stroke in 2017 she reports that she gets lost easily in well known areas and difficulty finding objects as well difficulty with time and date. MRI showed Small acute to early subacute infarct in the right  thalamus/posterior limb of internal capsule. and Small subacute left occipital lobe infarct.   Clinical Impression   Sheila Guerrero is a 60 year old woman who presents with acute to subacute CVAs with a history of an old CVA in 2017. She reports since her prior stroke she has had problems with getting lost, finding objects, difficulties with remembering dates and times, difficulty with reading but still functional - driving, performing IADLs and taking care of her parents. Today patient presents with decreased strength of  left upper extremity (compared to right), impaired sensation (reporting decreased light touch, sensitivity, transient feelings of tightness and pain in arm and ribs), decreased coordination in upper and lower extremity, impaired balance, decreased vision, and reports of cognitive impairment surrounding memory, dates and times. On evaluation patient able to perform ADLs with increased time and increased difficulty but still functional. She reports she can't hold a cup in her left hand for very long before she tips it over and that she is unable to load or unload the dishes (unable to maneuver dished without dropping). She demonstrates ability to ambulate but did have loss of balance with turning and with higher level balance assessment. She is at high risk for falls. In regards to vision she appears to have some degrees of bilateral peripheral vision loss - unsure if this is in the last 4 weeks or from prior stroke. Patient does well with compensating with turning her head and eyes to scan. In regards to prior difficulty with getting lost, difficulty navigating in new and familiar places (her home, reporting if she sleeps "hard" the night before she can't remember the layout of her home), difficulty remembering times and dates (she also reports the "feeling" of time") - therapist questions possible visual memory deficits. At this time therapist recommends patient be referred to [facility] OT, PT and SLP for formal cognitive evaluation. Therapist also recommend patient to f/u with her optometrist and/or a neurological optometrist. Patient verbalized understanding of all instructions, recommendations and POC.     Follow Up Recommendations  Outpatient OT , Follow up with Ophtometrist  Equipment Recommendations  None recommended by OT    Recommendations for Other Services       Precautions / Restrictions Precautions Precautions: Fall Restrictions Weight Bearing Restrictions: No      Mobility  Bed Mobility Overal bed mobility: Modified Independent                  Transfers Overall transfer level: Needs assistance Equipment used: None             General transfer comment: min guard to ambulate in hall, does have losses of balance with higher level balance testing. Has been ambulating to bathroom independently.    Balance Overall balance assessment: Mild deficits observed, not formally tested                                         ADL either performed or assessed with clinical judgement   ADL Overall ADL's : Modified independent                                       General ADL Comments: Increased time, may drop items or fumble, but able to perform.     Vision Patient Visual Report: Peripheral vision impairment;Eye fatigue/eye pain/headache (Appears to have some bilateral peripheral deficits. Able to compensate well.) Additional Comments: Functional visual tracking and able to locate fingers in all four quadrants. Appears to have some peripheral deficits bilaterally - but is functional. Reports she hasn't been able to read a book since prior stroke (doesn't remember what she read once she turns the page) and has a lot of fatigue/strain with short bouts of reading. No overt signs of loss of place with scanning menu and able to locate items on item. Questionable visual memory deficits that need to be explored in more depth.     Perception     Praxis      Pertinent Vitals/Pain Pain Assessment: 0-10 Pain Score: 8  Pain Location: R buttock referred to lateral knee Pain Descriptors / Indicators: Sharp;Restless Pain Intervention(s): Patient requesting pain meds-RN notified     Hand Dominance Right   Extremity/Trunk Assessment Upper Extremity Assessment Upper Extremity Assessment: RUE deficits/detail;LUE deficits/detail RUE Deficits / Details: WNL ROM 5/5 strength RUE Sensation: WNL RUE Coordination: WNL LUE Deficits /  Details: WNL ROM, 4+/5 strength compared to left. Reports she is unable to lift and carry dishes (unload dishwasher) with LUE, LUE Sensation:  (tingling, decreased light touch) LUE Coordination: decreased gross motor (mild ataxia with some dysmetria with finger to nose)   Lower Extremity Assessment Lower Extremity Assessment: Defer to PT evaluation   Cervical / Trunk Assessment Cervical / Trunk Assessment: Normal   Communication Communication Communication: No difficulties   Cognition Arousal/Alertness: Awake/alert Behavior During Therapy: WFL for tasks assessed/performed Overall Cognitive Status: Within Functional Limits for tasks assessed                                 General Comments: Reports difficulty with memory - remembering times, datess, concept of time. Using calendars, phone and reminders to assist with keeping up with appointments. Reports she gets lost driving, in stores (recalls that she got so frustrated that she couldn't fiind the potatoes or problem solve how to locate potatoes she just  left the store), forgets layout of home (if she sleeps "hard" the night before).   General Comments  See PT note for details. Has been using cane for four weeks.    Exercises     Shoulder Instructions      Home Living Family/patient expects to be discharged to:: Private residence Living Arrangements: Spouse/significant other Available Help at Discharge: Family;Available PRN/intermittently Type of Home: House Home Access: Stairs to enter [employer] of Steps: 2   Home Layout: One level     Bathroom Shower/Tub: Occupational psychologist: Standard     Home Equipment: Cane - single point          Prior Functioning/Environment Level of Independence: Independent with assistive device(s)        Comments: use of cane for mobility. Patient reports decreased memory and difficulty with time/date and navigation (gets lost easily)         OT Problem List: Decreased strength;Decreased coordination;Impaired vision/perception;Impaired balance (sitting and/or standing);Pain;Impaired UE functional use;Impaired sensation      OT Treatment/Interventions:      OT Goals(Current goals can be found in the care plan section) Acute Rehab OT Goals OT Goal Formulation: All assessment and education complete, DC therapy  OT Frequency:     Barriers to D/C:            Co-evaluation              AM-PAC OT "6 Clicks" Daily Activity     Outcome Measure Help from another person eating meals?: None Help from another person taking care of personal grooming?: None Help from another person toileting, which includes using toliet, bedpan, or urinal?: None Help from another person bathing (including washing, rinsing, drying)?: None Help from another person to put on and taking off regular upper body clothing?: None Help from another person to put on and taking off regular lower body clothing?: None 6 Click Score: 24   End of Session Equipment Utilized During Treatment: Gait belt Nurse Communication: Mobility status  Activity Tolerance: Patient tolerated treatment well Patient left: in chair;with call bell/phone within reach  OT Visit Diagnosis: Unsteadiness on feet (R26.81);Other symptoms and signs involving cognitive function;Other symptoms and signs involving the nervous system (R29.898);Pain Pain - Right/Left: Right Pain - part of body: Knee;Hip                Time: 0092-3300 OT Time Calculation (min): 40 min Charges:  OT General Charges $OT Visit: 1 Visit OT Evaluation $OT Eval High Complexity: 1 High  Alora Gorey, OTR/L [place]  Office [phone] Pager: [place] 02/08/2021, 9:52 AM

## 2021-02-08 NOTE — Progress Notes (Signed)
  Echocardiogram 2D Echocardiogram has been performed.  Sheila Guerrero 02/08/2021, 9:01 AM

## 2021-02-08 NOTE — Consult Note (Signed)
Reason for Consult: For transesophageal echocardiogram to rule out cardiac source of emboli Referring Physician: [facility] hospitalist  Sheila Guerrero is an 60 y.o. female.  HPI: Patient is 60 year old female with past medical history significant for recurrent CVA, hypertension, diabetes mellitus, hyperlipidemia, anxiety disorder, depression, was admitted yesterday because of left-sided tingling numbness and unsteady gait initially thought to be sciatica and subsequently seen by neurology and was placed on Plavix.   MRI of the brain which showed small right thalamic/posterior limb of internal capsule infarct and small subacute left occipital lobe infarct.  Cardiology consultation is called to evaluate for cardioembolic source of stroke.  Patient had transthoracic echo done which showed LVH with global hypokinesia EF of 40 to 45%.  Patient denies any exertional chest pain nausea vomiting diaphoresis.  Denies palpitations lightheadedness or syncope.  Denies PND orthopnea leg swelling.  Past Medical History:  Diagnosis Date  . Angioedema   . Anxiety   . Asthma   . Depression   . Diabetes mellitus, type II ([place])   . Dyslipidemia   . History of CVA (cerebrovascular accident)   . HTN (hypertension) 2013  . Panic attacks   . Pneumonia   . Situational stress     Past Surgical History:  Procedure Laterality Date  . CHOLECYSTECTOMY  2010    Family History  Problem Relation Age of Onset  . Hypertension Mother   . Other Mother        blood disorder  . Hypertension Father   . Stroke Father   . Diabetes Maternal Aunt     Social History:  reports that she has never smoked. She has never used smokeless tobacco. She reports current alcohol use. She reports that she does not use drugs.  Allergies:  Allergies  Allergen Reactions  . Sulfa Antibiotics Swelling  . Dulaglutide     Other reaction(s): rash  . Empagliflozin     Other reaction(s): recurrent yeast  . Iodinated Diagnostic Agents      Other reaction(s): angioedema with CT dye  . Latex Rash    Medications: I have reviewed the patient's current medications.  Results for orders placed or performed during the hospital encounter of 02/07/21 (from the past 48 hour(s))  Protime-INR     Status: None   Collection Time: 02/07/21 11:45 AM  Result Value Ref Range   Prothrombin Time 12.8 11.4 - 15.2 seconds   INR 1.0 0.8 - 1.2    Comment: (NOTE) INR goal varies based on device and disease states. Performed at [facility], [place] [address] [place], [place] [address]   APTT     Status: None   Collection Time: 02/07/21 11:45 AM  Result Value Ref Range   aPTT 26 24 - 36 seconds    Comment: Performed at [facility], [place] [address], [place], [place] [address]  CBC     Status: Abnormal   Collection Time: 02/07/21 11:45 AM  Result Value Ref Range   WBC 9.0 4.0 - 10.5 K/uL   RBC 5.32 (H) 3.87 - 5.11 MIL/uL   Hemoglobin 15.7 (H) 12.0 - 15.0 g/dL   HCT 45.8 36.0 - 46.0 %   MCV 86.1 80.0 - 100.0 fL   MCH 29.5 26.0 - 34.0 pg   MCHC 34.3 30.0 - 36.0 g/dL   RDW 12.6 11.5 - 15.5 %   Platelets 301 150 - 400 K/uL   nRBC 0.0 0.0 - 0.2 %    Comment: Performed at [facility], [place] [address], [place], [place] [address]  Differential     Status: None   Collection Time: 02/07/21 11:45 AM  Result Value Ref Range   Neutrophils Relative % 66 %   Neutro Abs 6.0 1.7 - 7.7 K/uL   Lymphocytes Relative 25 %   Lymphs Abs 2.2 0.7 - 4.0 K/uL   Monocytes Relative 7 %   Monocytes Absolute 0.7 0.1 - 1.0 K/uL   Eosinophils Relative 1 %   Eosinophils Absolute 0.1 0.0 - 0.5 K/uL   Basophils Relative 1 %   Basophils Absolute 0.1 0.0 - 0.1 K/uL   Immature Granulocytes 0 %   Abs Immature Granulocytes 0.03 0.00 - 0.07 K/uL    Comment: Performed at [facility], [place] [address], [place], [place] [address]  Comprehensive metabolic panel     Status: Abnormal   Collection Time:  02/07/21 11:45 AM  Result Value Ref Range   Sodium 139 135 - 145 mmol/L   Potassium 3.4 (L) 3.5 - 5.1 mmol/L   Chloride 105 98 - 111 mmol/L   CO2 25 22 - 32 mmol/L   Glucose, Bld 298 (H) 70 - 99 mg/dL    Comment: Glucose reference range applies only to samples taken after fasting for at least 8 hours.   BUN 18 6 - 20 mg/dL   Creatinine, Ser 0.53 0.44 - 1.00 mg/dL   Calcium 9.2 8.9 - 10.3 mg/dL   Total Protein 6.8 6.5 - 8.1 g/dL   Albumin 3.8 3.5 - 5.0 g/dL   AST 16 15 - 41 U/L   ALT 21 0 - 44 U/L   Alkaline Phosphatase 71 38 - 126 U/L   Total Bilirubin 0.8 0.3 - 1.2 mg/dL   GFR, Estimated >60 >60 mL/min    Comment: (NOTE) Calculated using the CKD-EPI Creatinine Equation (2021)    Anion gap 9 5 - 15    Comment: Performed at [facility] [place], [place] [address], [place], [place] [address]  I-Stat beta hCG blood, ED     Status: Abnormal   Collection Time: 02/07/21 12:00 PM  Result Value Ref Range   I-stat hCG, quantitative 6.7 (H) <5 mIU/mL   Comment 3            Comment:   GEST. AGE      CONC.  (mIU/mL)   <=1 WEEK        5 - 50     2 WEEKS       50 - 500     3 WEEKS       100 - 10,000     4 WEEKS     1,000 - 30,000        FEMALE AND NON-PREGNANT FEMALE:     LESS THAN 5 mIU/mL   Urine rapid drug screen (hosp performed)     Status: Abnormal   Collection Time: 02/07/21  1:57 PM  Result Value Ref Range   Opiates POSITIVE (A) NONE DETECTED   Cocaine NONE DETECTED NONE DETECTED   Benzodiazepines NONE DETECTED NONE DETECTED   Amphetamines NONE DETECTED NONE DETECTED   Tetrahydrocannabinol NONE DETECTED NONE DETECTED   Barbiturates NONE DETECTED NONE DETECTED    Comment: (NOTE) DRUG SCREEN FOR MEDICAL PURPOSES ONLY.  IF CONFIRMATION IS NEEDED FOR ANY PURPOSE, NOTIFY LAB WITHIN 5 DAYS.  LOWEST DETECTABLE LIMITS FOR URINE DRUG SCREEN Drug Class  Cutoff (ng/mL) Amphetamine and metabolites    1000 Barbiturate and metabolites    200 Benzodiazepine                  295 Tricyclics and metabolites     300 Opiates and metabolites        300 Cocaine and metabolites        300 THC                            50 Performed at [facility], [place] [address], [place], [place] [address]   Urinalysis, Routine w reflex microscopic     Status: Abnormal   Collection Time: 02/07/21  1:57 PM  Result Value Ref Range   Color, Urine YELLOW YELLOW   APPearance HAZY (A) CLEAR   Specific Gravity, Urine 1.026 1.005 - 1.030   pH 5.0 5.0 - 8.0   Glucose, UA 150 (A) NEGATIVE mg/dL   Hgb urine dipstick NEGATIVE NEGATIVE   Bilirubin Urine NEGATIVE NEGATIVE   Ketones, ur 5 (A) NEGATIVE mg/dL   Protein, ur NEGATIVE NEGATIVE mg/dL   Nitrite NEGATIVE NEGATIVE   Leukocytes,Ua MODERATE (A) NEGATIVE   RBC / HPF 21-50 0 - 5 RBC/hpf   WBC, UA 21-50 0 - 5 WBC/hpf   Bacteria, UA NONE SEEN NONE SEEN   Squamous Epithelial / LPF 0-5 0 - 5   Mucus PRESENT    Ca Oxalate Crys, UA PRESENT     Comment: Performed at [facility], [place] [address], [place], [place] [address]  Resp Panel by RT-PCR (Flu A&B, Covid) Nasopharyngeal Swab     Status: None   Collection Time: 02/07/21  3:00 PM   Specimen: Nasopharyngeal Swab; Nasopharyngeal(NP) swabs in vial transport medium  Result Value Ref Range   SARS Coronavirus 2 by RT PCR NEGATIVE NEGATIVE    Comment: (NOTE) SARS-CoV-2 target nucleic acids are NOT DETECTED.  The SARS-CoV-2 RNA is generally detectable in upper respiratory specimens during the acute phase of infection. The lowest concentration of SARS-CoV-2 viral copies this assay can detect is 138 copies/mL. A negative result does not preclude SARS-Cov-2 infection and should not be used as the sole basis for treatment or other patient management decisions. A negative result may occur with  improper specimen collection/handling, submission of specimen other than nasopharyngeal swab, presence of viral mutation(s) within the areas  targeted by this assay, and inadequate number of viral copies(<138 copies/mL). A negative result must be combined with clinical observations, patient history, and epidemiological information. The expected result is Negative.  Fact Sheet for Patients:  EntrepreneurPulse.com.au  Fact Sheet for Healthcare Providers:  IncredibleEmployment.be  This test is no t yet approved or cleared by the [place] FDA and  has been authorized for detection and/or diagnosis of SARS-CoV-2 by FDA under an Emergency Use Authorization (EUA). This EUA will remain  in effect (meaning this test can be used) for the duration of the COVID-19 declaration under Section 564(b)(1) of the Act, 21 U.S.C.section 360bbb-3(b)(1), unless the authorization is terminated  or revoked sooner.       Influenza A by PCR NEGATIVE NEGATIVE   Influenza B by PCR NEGATIVE NEGATIVE    Comment: (NOTE) The Xpert Xpress SARS-CoV-2/FLU/RSV plus assay is intended as an aid in the diagnosis of influenza from Nasopharyngeal swab specimens and should not be used as a sole basis for treatment. Nasal washings and aspirates are unacceptable for Xpert Xpress SARS-CoV-2/FLU/RSV  testing.  Fact Sheet for Patients: EntrepreneurPulse.com.au  Fact Sheet for Healthcare Providers: IncredibleEmployment.be  This test is not yet approved or cleared by the [place] FDA and has been authorized for detection and/or diagnosis of SARS-CoV-2 by FDA under an Emergency Use Authorization (EUA). This EUA will remain in effect (meaning this test can be used) for the duration of the COVID-19 declaration under Section 564(b)(1) of the Act, 21 U.S.C. section 360bbb-3(b)(1), unless the authorization is terminated or revoked.  Performed at [facility], [place] [address], [place], [place] [address]   Glucose, capillary     Status: Abnormal   Collection Time:  02/07/21  5:37 PM  Result Value Ref Range   Glucose-Capillary 309 (H) 70 - 99 mg/dL    Comment: Glucose reference range applies only to samples taken after fasting for at least 8 hours.  Glucose, capillary     Status: Abnormal   Collection Time: 02/08/21  7:25 AM  Result Value Ref Range   Glucose-Capillary 269 (H) 70 - 99 mg/dL    Comment: Glucose reference range applies only to samples taken after fasting for at least 8 hours.  CK     Status: None   Collection Time: 02/08/21  7:42 AM  Result Value Ref Range   Total CK 40 38 - 234 U/L    Comment: Performed at [facility], [place] [address], [place], [place] [address]  Lipid panel     Status: Abnormal   Collection Time: 02/08/21  7:47 AM  Result Value Ref Range   Cholesterol 130 0 - 200 mg/dL   Triglycerides 211 (H) <150 mg/dL   HDL 33 (L) >40 mg/dL   Total CHOL/HDL Ratio 3.9 RATIO   VLDL 42 (H) 0 - 40 mg/dL   LDL Cholesterol 55 0 - 99 mg/dL    Comment:        Total Cholesterol/HDL:CHD Risk Coronary Heart Disease Risk Table                     Men   Women  1/2 Average Risk   3.4   3.3  Average Risk       5.0   4.4  2 X Average Risk   9.6   7.1  3 X Average Risk  23.4   11.0        Use the calculated Patient Ratio above and the CHD Risk Table to determine the patient's CHD Risk.        ATP III CLASSIFICATION (LDL):  <100     mg/dL   Optimal  100-129  mg/dL   Near or Above                    Optimal  130-159  mg/dL   Borderline  160-189  mg/dL   High  >190     mg/dL   Very High Performed at [facility] [address], [place], [name] [address]   Glucose, capillary     Status: Abnormal   Collection Time: 02/08/21 11:48 AM  Result Value Ref Range   Glucose-Capillary 270 (H) 70 - 99 mg/dL    Comment: Glucose reference range applies only to samples taken after fasting for at least 8 hours.    MR ANGIO HEAD WO CONTRAST  Result Date: 02/07/2021 CLINICAL DATA:  Stroke. EXAM: MRA HEAD  WITHOUT CONTRAST TECHNIQUE: Angiographic images of the Circle of Willis were obtained using MRA technique without intravenous  contrast. COMPARISON:  None. FINDINGS: The visualized distal vertebral arteries are patent to the basilar with the left being dominant. There is moderate irregular narrowing of the distal right V4 segment. Patent PICAs and SCAs are seen bilaterally with evidence of a severe stenosis of the origin of the right SCA. The basilar artery is widely patent. Both PCAs are patent without evidence of a significant proximal stenosis. The internal carotid arteries are widely patent from skull base to carotid termini. ACAs and MCAs are patent with mild branch vessel irregularity but without evidence of a proximal branch occlusion or significant A1 or left M1 stenosis. There is a severe distal right M1 stenosis. An accessory left MCA and azygos A2 are noted, normal variants. No aneurysm is identified. IMPRESSION: 1. Intracranial atherosclerosis including severe right M1 and moderate right V4 stenoses. 2. No large vessel occlusion. Electronically Signed   By: Logan Bores M.D.   On: 02/07/2021 18:20   MR Brain Wo Contrast (neuro protocol)  Result Date: 02/07/2021 CLINICAL DATA:  Left-sided weakness for 4 weeks. EXAM: MRI HEAD WITHOUT CONTRAST TECHNIQUE: Multiplanar, multiecho pulse sequences of the brain and surrounding structures were obtained without intravenous contrast. COMPARISON:  03/02/2017 FINDINGS: Brain: There is an approximately 1.5 cm acute to early subacute infarct involving the lateral aspect of the right thalamus and posterior limb of the right internal capsule. There is also likely a small subacute infarct in the left occipital lobe. There are multiple chronic microhemorrhages scattered throughout the cerebrum and cerebellum including prominent bi thalamic involvement as well as in the pons with the distribution suggestive of chronic hypertension. T2 hyperintensities in the cerebral  white matter bilaterally have progressed and are nonspecific but compatible with moderately age advanced chronic small vessel ischemic disease given the patient's vascular risk factors. There are chronic lacunar infarcts in the deep cerebral white matter bilaterally. The ventricles and sulci are within normal limits for age. No mass, midline shift, or extra-axial fluid collection is identified. Vascular: Major intracranial vascular flow voids are preserved. Skull and upper cervical spine: Unremarkable bone marrow signal. Sinuses/Orbits: Unremarkable orbits. Paranasal sinuses and mastoid air cells are clear. Other: None. IMPRESSION: 1. Small acute to early subacute infarct in the right thalamus/posterior limb of internal capsule. 2. Small subacute left occipital lobe infarct. 3. Moderately advanced chronic small vessel ischemic disease, progressed from 2018. 4. Numerous chronic microhemorrhages consistent with chronic hypertension. Electronically Signed   By: Logan Bores M.D.   On: 02/07/2021 13:28   ECHOCARDIOGRAM COMPLETE  Result Date: 02/08/2021    ECHOCARDIOGRAM REPORT   Patient Name:   KRISNA OMAR Date of Exam: 02/08/2021 Medical Rec #:  [id number]          Height:       65.0 in Accession #:    [id number]         Weight:       182.0 lb Date of Birth:  May 21, 1961          BSA:          1.900 m Patient Age:    5 years           BP:           131/70 mmHg Patient Gender: F                  HR:           106 bpm. Exam Location:  Inpatient Procedure: 2D Echo, 3D Echo, Cardiac Doppler, Color Doppler and Intracardiac  Opacification Agent Indications:    Stroke  History:        Patient has no prior history of Echocardiogram examinations.                 Stroke, Signs/Symptoms:Altered Mental Status; Risk                 Factors:Diabetes.  Sonographer:    Roseanna Rainbow RDCS Referring Phys: [id number] ANAND D HONGALGI  Sonographer Comments: Technically difficult study due to poor echo windows. Patient moving  throughout test due to leg pain. IMPRESSIONS  1. Left ventricular ejection fraction, by estimation, is 40 to 45%. The left ventricle has mildly decreased function. The left ventricle demonstrates global hypokinesis. There is moderate concentric left ventricular hypertrophy. Indeterminate diastolic filling due to E-A fusion.  2. Right ventricular systolic function is normal. The right ventricular size is normal. Tricuspid regurgitation signal is inadequate for assessing PA pressure.  3. The mitral valve is grossly normal. Trivial mitral valve regurgitation. No evidence of mitral stenosis.  4. The aortic valve is tricuspid. Aortic valve regurgitation is not visualized. No aortic stenosis is present.  5. The inferior vena cava is normal in size with greater than 50% respiratory variability, suggesting right atrial pressure of 3 mmHg. Conclusion(s)/Recommendation(s): No intracardiac source of embolism detected on this transthoracic study. A transesophageal echocardiogram is recommended to exclude cardiac source of embolism if clinically indicated. No left ventricular mural or apical thrombus/thrombi. FINDINGS  Left Ventricle: Left ventricular ejection fraction, by estimation, is 40 to 45%. The left ventricle has mildly decreased function. The left ventricle demonstrates global hypokinesis. Definity contrast agent was given IV to delineate the left ventricular  endocardial borders. The left ventricular internal cavity size was normal in size. There is moderate concentric left ventricular hypertrophy. Indeterminate diastolic filling due to E-A fusion. Right Ventricle: The right ventricular size is normal. No increase in right ventricular wall thickness. Right ventricular systolic function is normal. Tricuspid regurgitation signal is inadequate for assessing PA pressure. Left Atrium: Left atrial size was normal in size. Right Atrium: Right atrial size was normal in size. Pericardium: Trivial pericardial effusion is  present. Presence of pericardial fat pad. Mitral Valve: The mitral valve is grossly normal. There is mild thickening of the mitral valve leaflet(s). There is mild calcification of the anterior and posterior mitral valve leaflet(s). Mild mitral annular calcification. Trivial mitral valve regurgitation. No evidence of mitral valve stenosis. Tricuspid Valve: The tricuspid valve is grossly normal. Tricuspid valve regurgitation is trivial. No evidence of tricuspid stenosis. Aortic Valve: The aortic valve is tricuspid. Aortic valve regurgitation is not visualized. No aortic stenosis is present. Pulmonic Valve: The pulmonic valve was grossly normal. Pulmonic valve regurgitation is not visualized. No evidence of pulmonic stenosis. Aorta: The aortic root and ascending aorta are structurally normal, with no evidence of dilitation. Venous: The inferior vena cava is normal in size with greater than 50% respiratory variability, suggesting right atrial pressure of 3 mmHg. IAS/Shunts: The atrial septum is grossly normal.  LEFT VENTRICLE PLAX 2D LVIDd:         4.50 cm      Diastology LVIDs:         3.60 cm      LV e' medial:    7.62 cm/s LV PW:         1.35 cm      LV E/e' medial:  14.4 LV IVS:        1.42 cm  LV e' lateral:   5.11 cm/s                             LV E/e' lateral: 21.5  LV Volumes (MOD) LV vol d, MOD A2C: 118.0 ml LV vol d, MOD A4C: 117.0 ml LV vol s, MOD A2C: 66.7 ml LV vol s, MOD A4C: 69.4 ml LV SV MOD A2C:     51.3 ml LV SV MOD A4C:     117.0 ml LV SV MOD BP:      49.1 ml RIGHT VENTRICLE            IVC RV S prime:     9.57 cm/s  IVC diam: 1.50 cm TAPSE (M-mode): 1.8 cm LEFT ATRIUM             Index       RIGHT ATRIUM          Index LA diam:        2.90 cm 1.53 cm/m  RA Area:     7.36 cm LA Vol (A2C):   47.5 ml 24.99 ml/m RA Volume:   13.60 ml 7.16 ml/m LA Vol (A4C):   20.2 ml 10.63 ml/m LA Biplane Vol: 31.7 ml 16.68 ml/m  AORTIC VALVE LVOT Vmax:   93.80 cm/s LVOT Vmean:  68.900 cm/s LVOT VTI:    0.157  m  AORTA Ao Root diam: 2.80 cm Ao Asc diam:  3.50 cm MITRAL VALVE MV Area (PHT): 4.49 cm     SHUNTS MV Decel Time: 169 msec     Systemic VTI: 0.16 m MV E velocity: 110.00 cm/s MV A velocity: 61.60 cm/s MV E/A ratio:  1.79 Eleonore Chiquito MD Electronically signed by Eleonore Chiquito MD Signature Date/Time: 02/08/2021/10:55:01 AM    Final     Review of Systems  Constitutional: Positive for chills. Negative for fever.  HENT: Negative for sore throat.   Eyes: Negative for discharge.  Respiratory: Negative for cough, chest tightness and shortness of breath.   Cardiovascular: Negative for chest pain, palpitations and leg swelling.  Gastrointestinal: Negative for abdominal distention and abdominal pain.  Genitourinary: Negative for difficulty urinating.  Neurological: Negative for dizziness, seizures, facial asymmetry and speech difficulty.   Blood pressure 137/85, pulse (!) 114, temperature 98.3 F (36.8 C), temperature source Oral, resp. rate (!) 30, height 5\' 5"  (1.651 m), weight 82.6 kg, last menstrual period 11/25/2011, SpO2 97 %. Physical Exam Constitutional:      Appearance: Normal appearance.  HENT:     Head: Normocephalic and atraumatic.  Eyes:     Extraocular Movements: Extraocular movements intact.     Conjunctiva/sclera: Conjunctivae normal.     Pupils: Pupils are equal, round, and reactive to light.  Cardiovascular:     Rate and Rhythm: Normal rate and regular rhythm.     Heart sounds: Murmur (Soft systolic murmur noted no S3 gallop) heard.    Pulmonary:     Effort: Pulmonary effort is normal. No respiratory distress.     Breath sounds: Normal breath sounds. No wheezing.  Abdominal:     General: Abdomen is flat.     Palpations: Abdomen is soft.  Musculoskeletal:        General: No swelling, tenderness or deformity.     Cervical back: Normal range of motion and neck supple.  Skin:    General: Skin is warm and dry.  Neurological:     Mental Status: She is alert  and oriented to  person, place, and time.     Assessment/Plan: Status post small acute right thalamic/posterior limb of internal capsule and small subacute left occipital lobe infarct rule out cardiac emboli History of CVA in 2017 Hypertension Hypertensive heart disease with systolic dysfunction Type 2 diabetes mellitus Hyperlipidemia Anxiety Depression Plan Will DC lisinopril and switch to [facility] after 36 hours of washout..   Start carvedilol 3.125 mg twice daily Discussed with patient and her husband at length regarding TEE and loop recorder and agrees for the procedure We will schedule for TEE for Tuesday Consider EP consultation for loop recorder Charolette Forward 02/08/2021, 12:24 PM

## 2021-02-08 NOTE — Progress Notes (Signed)
PROGRESS NOTE   Sheila Guerrero  BOF:[id number]    DOB: 1961-03-06    DOA: 02/07/2021  PCP: Donald Prose, MD   I have briefly reviewed patients previous medical records in [facility].  Chief Complaint  Patient presents with  . Numbness    Brief Narrative:  59 year old married female with medical history significant for but not limited to angioedema, hypertension, hyperlipidemia, type II DM, anxiety, depression, CVA in 2017 with some memory issues, presented to the ED with 4 weeks history of left-sided weakness and numbness, had started suddenly, seen by PCP, initially thought to be neuropathy and prescribed Lyrica with minimal improvement, continued to have worsening neuropathic pain, seen by pain MD who was concerned for an acute stroke and referred her to neurology.  She was seen by neurology on 3/7 who were concerned for right thalamic infarct, MRI brain scheduled for 3/15, started around Plavix.  She continued to have left-sided symptoms and new onset right sided sciatica with pain to the right knee.  Admitted for acute stroke.  Neurology consulted and stroke work-up ongoing.  Cardiology consulted for new onset cardiomyopathy and for possible TEE.   Assessment & Plan:  Principal Problem:   Right thalamic infarction [facility]) Active Problems:   Type 2 diabetes mellitus with hyperlipidemia (HCC)   Paresthesia   Essential hypertension   Acute/subacute ischemic stroke  Resultant left-sided sensory deficit and mild ataxia  MRI brain 3/12: Small acute to early subacute infarct in the right thalamus/posterior limb of the internal capsule.  Small subacute left occipital lobe infarct.  Moderately advanced chronic small vessel disease progressed from 2018.  Numerous chronic microhemorrhages consistent with chronic hypertension.  MRA of the head: Intracranial atherosclerosis including severe right M1 and moderate right V4 stenosis.  No large vessel occlusion.  2D echo: LVEF 40-45% with  global hypokinesis, moderate concentric LVH.  No intracardiac source of embolism detected on this study.  Carotid Dopplers requested and pending.  LDL 55  A1c pending.  Therapies have evaluated and recommend outpatient PT, OT and SLP follow-up  Neurology consultation appreciated.  I discussed with Dr. Cheral Marker.  DAPT with aspirin 81 mg daily + Plavix 75 mg daily recommended due to severe intracranial atherosclerotic narrowing revealed by MRA.  Atorvastatin increased to 80 mg daily.  If no A. fib on telemetry, he recommends TEE and loop recorder.  Outpatient neurology follow-up  New cardiomyopathy  Cardiology consultation appreciated.  Discussed with Dr. Terrence Dupont.  Suspects due to uncontrolled hypertension.  Discontinued lisinopril and plan to start Entresto after 36 hours washout.  Started carvedilol 3.125 mg twice daily.  Plan for TEE 3/15 followed by loop recorder  Hypokalemia  Replace and follow.  RLE sciatica  Started 5 days ago.  Patient reports prior history of same when she had fractures of her left foot with gait issues.  Had seen chiropractor with improvement.  Declines any inpatient evaluation i.e. MRI L-spine etc.  States that she will have this evaluated by her chiropractor upon discharge.  Increased her Lyrica prior to recent increased dose of 150 mg twice daily.  Judicious use of opioids/pain management.  K pad as needed.  Essential hypertension  Cardiology have discontinued lisinopril and plan to start Entresto after 36 hours washout for new cardiomyopathy  They have started carvedilol 3.125 mg twice daily.  Type II DM with hyperlipidemia  Prior A1c in 06/2017: 9.3.  Likely uncontrolled.  Follow repeat A1c.  Hold Metformin.  Currently on Lantus 10 units daily, mealtime NovoLog  and SSI.  Adjust insulins as needed.  Hyperlipidemia:  Atorvastatin dose increased by neurology due to current stroke.  CK normal.  Anxiety/depression  Continue home  meds.   Body mass index is 30.29 kg/m.   DVT prophylaxis: enoxaparin (LOVENOX) injection 40 mg Start: 02/07/21 1830     Code Status: Full Code Family Communication: None at bedside Disposition:  Status is: Observation  The patient will require care spanning > 2 midnights and should be moved to inpatient because: Inpatient level of care appropriate due to severity of illness  Dispo: The patient is from: Home              Anticipated d/c is to: Home              Patient currently is not medically stable to d/c.   Difficult to place patient No        Consultants:   Neurology Cardiology/Dr. Terrence Dupont  Procedures:   None  Antimicrobials:    Anti-infectives (From admission, onward)   None        Subjective:  Seen early this morning.  Reports ongoing left-sided numbness, weakness, gait issues related to that.  Ongoing right hip anteriorly to right knee pain without clear aggravating or relieving factors ongoing for the last 5 days.  No slurred speech or facial asymmetry.  Objective:   Vitals:   02/08/21 0723 02/08/21 1212 02/08/21 1213 02/08/21 1313  BP:  137/85  124/76  Pulse:  (!) 114 (!) 110 (!) 108  Resp:  (!) 30 16 20   Temp: 97.6 F (36.4 C) 98.3 F (36.8 C)  98.2 F (36.8 C)  TempSrc: Oral Oral  Oral  SpO2:  97% 96% 95%  Weight:      Height:        General exam: Young female, moderately built and nourished seen ambulating with supervision/assistance from the therapy team.  Some gait dysfunction. Respiratory system: Clear to auscultation. Respiratory effort normal. Cardiovascular system: S1 & S2 heard, RRR. No JVD, murmurs, rubs, gallops or clicks. No pedal edema.  Telemetry personally reviewed: SR-mild ST in the 100s Gastrointestinal system: Abdomen is nondistended, soft and nontender. No organomegaly or masses felt. Normal bowel sounds heard. Central nervous system: Alert and oriented. No focal neurological deficits.  Left hemisensory deficits.    Extremities: Symmetric 5 x 5 power. Mild diminished left grip. Skin: No rashes, lesions or ulcers Psychiatry: Judgement and insight appear normal. Mood & affect appropriate.     Data Reviewed:   I have personally reviewed following labs and imaging studies   CBC: Recent Labs  Lab 02/07/21 1145  WBC 9.0  NEUTROABS 6.0  HGB 15.7*  HCT 45.8  MCV 86.1  PLT 741    Basic Metabolic Panel: Recent Labs  Lab 02/07/21 1145  NA 139  K 3.4*  CL 105  CO2 25  GLUCOSE 298*  BUN 18  CREATININE 0.53  CALCIUM 9.2    Liver Function Tests: Recent Labs  Lab 02/07/21 1145  AST 16  ALT 21  ALKPHOS 71  BILITOT 0.8  PROT 6.8  ALBUMIN 3.8    CBG: Recent Labs  Lab 02/07/21 1737 02/08/21 0725 02/08/21 1148  GLUCAP 309* 269* 270*    Microbiology Studies:   Recent Results (from the past 240 hour(s))  Resp Panel by RT-PCR (Flu A&B, Covid) Nasopharyngeal Swab     Status: None   Collection Time: 02/07/21  3:00 PM   Specimen: Nasopharyngeal Swab; Nasopharyngeal(NP) swabs in vial transport medium  Result Value Ref Range Status   SARS Coronavirus 2 by RT PCR NEGATIVE NEGATIVE Final    Comment: (NOTE) SARS-CoV-2 target nucleic acids are NOT DETECTED.  The SARS-CoV-2 RNA is generally detectable in upper respiratory specimens during the acute phase of infection. The lowest concentration of SARS-CoV-2 viral copies this assay can detect is 138 copies/mL. A negative result does not preclude SARS-Cov-2 infection and should not be used as the sole basis for treatment or other patient management decisions. A negative result may occur with  improper specimen collection/handling, submission of specimen other than nasopharyngeal swab, presence of viral mutation(s) within the areas targeted by this assay, and inadequate number of viral copies(<138 copies/mL). A negative result must be combined with clinical observations, patient history, and epidemiological information. The expected  result is Negative.  Fact Sheet for Patients:  EntrepreneurPulse.com.au  Fact Sheet for Healthcare Providers:  IncredibleEmployment.be  This test is no t yet approved or cleared by the [place] FDA and  has been authorized for detection and/or diagnosis of SARS-CoV-2 by FDA under an Emergency Use Authorization (EUA). This EUA will remain  in effect (meaning this test can be used) for the duration of the COVID-19 declaration under Section 564(b)(1) of the Act, 21 U.S.C.section 360bbb-3(b)(1), unless the authorization is terminated  or revoked sooner.       Influenza A by PCR NEGATIVE NEGATIVE Final   Influenza B by PCR NEGATIVE NEGATIVE Final    Comment: (NOTE) The Xpert Xpress SARS-CoV-2/FLU/RSV plus assay is intended as an aid in the diagnosis of influenza from Nasopharyngeal swab specimens and should not be used as a sole basis for treatment. Nasal washings and aspirates are unacceptable for Xpert Xpress SARS-CoV-2/FLU/RSV testing.  Fact Sheet for Patients: EntrepreneurPulse.com.au  Fact Sheet for Healthcare Providers: IncredibleEmployment.be  This test is not yet approved or cleared by the [place] FDA and has been authorized for detection and/or diagnosis of SARS-CoV-2 by FDA under an Emergency Use Authorization (EUA). This EUA will remain in effect (meaning this test can be used) for the duration of the COVID-19 declaration under Section 564(b)(1) of the Act, 21 U.S.C. section 360bbb-3(b)(1), unless the authorization is terminated or revoked.  Performed at [facility], [place] [address], [place], [place] [address]      Radiology Studies:  MR ANGIO HEAD WO CONTRAST  Result Date: 02/07/2021 CLINICAL DATA:  Stroke. EXAM: MRA HEAD WITHOUT CONTRAST TECHNIQUE: Angiographic images of the Circle of Willis were obtained using MRA technique without intravenous contrast.  COMPARISON:  None. FINDINGS: The visualized distal vertebral arteries are patent to the basilar with the left being dominant. There is moderate irregular narrowing of the distal right V4 segment. Patent PICAs and SCAs are seen bilaterally with evidence of a severe stenosis of the origin of the right SCA. The basilar artery is widely patent. Both PCAs are patent without evidence of a significant proximal stenosis. The internal carotid arteries are widely patent from skull base to carotid termini. ACAs and MCAs are patent with mild branch vessel irregularity but without evidence of a proximal branch occlusion or significant A1 or left M1 stenosis. There is a severe distal right M1 stenosis. An accessory left MCA and azygos A2 are noted, normal variants. No aneurysm is identified. IMPRESSION: 1. Intracranial atherosclerosis including severe right M1 and moderate right V4 stenoses. 2. No large vessel occlusion. Electronically Signed   By: Logan Bores M.D.   On: 02/07/2021 18:20   MR Brain Wo Contrast (neuro protocol)  Result Date: 02/07/2021 CLINICAL DATA:  Left-sided weakness for 4 weeks. EXAM: MRI HEAD WITHOUT CONTRAST TECHNIQUE: Multiplanar, multiecho pulse sequences of the brain and surrounding structures were obtained without intravenous contrast. COMPARISON:  03/02/2017 FINDINGS: Brain: There is an approximately 1.5 cm acute to early subacute infarct involving the lateral aspect of the right thalamus and posterior limb of the right internal capsule. There is also likely a small subacute infarct in the left occipital lobe. There are multiple chronic microhemorrhages scattered throughout the cerebrum and cerebellum including prominent bi thalamic involvement as well as in the pons with the distribution suggestive of chronic hypertension. T2 hyperintensities in the cerebral white matter bilaterally have progressed and are nonspecific but compatible with moderately age advanced chronic small vessel ischemic  disease given the patient's vascular risk factors. There are chronic lacunar infarcts in the deep cerebral white matter bilaterally. The ventricles and sulci are within normal limits for age. No mass, midline shift, or extra-axial fluid collection is identified. Vascular: Major intracranial vascular flow voids are preserved. Skull and upper cervical spine: Unremarkable bone marrow signal. Sinuses/Orbits: Unremarkable orbits. Paranasal sinuses and mastoid air cells are clear. Other: None. IMPRESSION: 1. Small acute to early subacute infarct in the right thalamus/posterior limb of internal capsule. 2. Small subacute left occipital lobe infarct. 3. Moderately advanced chronic small vessel ischemic disease, progressed from 2018. 4. Numerous chronic microhemorrhages consistent with chronic hypertension. Electronically Signed   By: Logan Bores M.D.   On: 02/07/2021 13:28   ECHOCARDIOGRAM COMPLETE  Result Date: 02/08/2021    ECHOCARDIOGRAM REPORT   Patient Name:   Sheila Guerrero Date of Exam: 02/08/2021 Medical Rec #:  [id number]          Height:       65.0 in Accession #:    [id number]         Weight:       182.0 lb Date of Birth:  [birth year]          BSA:          1.900 m Patient Age:    81 years           BP:           131/70 mmHg Patient Gender: F                  HR:           106 bpm. Exam Location:  Inpatient Procedure: 2D Echo, 3D Echo, Cardiac Doppler, Color Doppler and Intracardiac            Opacification Agent Indications:    Stroke  History:        Patient has no prior history of Echocardiogram examinations.                 Stroke, Signs/Symptoms:Altered Mental Status; Risk                 Factors:Diabetes.  Sonographer:    Roseanna Rainbow RDCS Referring Phys: [id number] Younis Mathey D Myliah Medel  Sonographer Comments: Technically difficult study due to poor echo windows. Patient moving throughout test due to leg pain. IMPRESSIONS  1. Left ventricular ejection fraction, by estimation, is 40 to 45%. The left ventricle has  mildly decreased function. The left ventricle demonstrates global hypokinesis. There is moderate concentric left ventricular hypertrophy. Indeterminate diastolic filling due to E-A fusion.  2. Right ventricular systolic function is normal. The right ventricular size is normal. Tricuspid regurgitation signal is inadequate for assessing PA pressure.  3. The mitral valve is grossly normal. Trivial mitral valve regurgitation. No evidence of mitral stenosis.  4. The aortic valve is tricuspid. Aortic valve regurgitation is not visualized. No aortic stenosis is present.  5. The inferior vena cava is normal in size with greater than 50% respiratory variability, suggesting right atrial pressure of 3 mmHg. Conclusion(s)/Recommendation(s): No intracardiac source of embolism detected on this transthoracic study. A transesophageal echocardiogram is recommended to exclude cardiac source of embolism if clinically indicated. No left ventricular mural or apical thrombus/thrombi. FINDINGS  Left Ventricle: Left ventricular ejection fraction, by estimation, is 40 to 45%. The left ventricle has mildly decreased function. The left ventricle demonstrates global hypokinesis. Definity contrast agent was given IV to delineate the left ventricular  endocardial borders. The left ventricular internal cavity size was normal in size. There is moderate concentric left ventricular hypertrophy. Indeterminate diastolic filling due to E-A fusion. Right Ventricle: The right ventricular size is normal. No increase in right ventricular wall thickness. Right ventricular systolic function is normal. Tricuspid regurgitation signal is inadequate for assessing PA pressure. Left Atrium: Left atrial size was normal in size. Right Atrium: Right atrial size was normal in size. Pericardium: Trivial pericardial effusion is present. Presence of pericardial fat pad. Mitral Valve: The mitral valve is grossly normal. There is mild thickening of the mitral valve  leaflet(s). There is mild calcification of the anterior and posterior mitral valve leaflet(s). Mild mitral annular calcification. Trivial mitral valve regurgitation. No evidence of mitral valve stenosis. Tricuspid Valve: The tricuspid valve is grossly normal. Tricuspid valve regurgitation is trivial. No evidence of tricuspid stenosis. Aortic Valve: The aortic valve is tricuspid. Aortic valve regurgitation is not visualized. No aortic stenosis is present. Pulmonic Valve: The pulmonic valve was grossly normal. Pulmonic valve regurgitation is not visualized. No evidence of pulmonic stenosis. Aorta: The aortic root and ascending aorta are structurally normal, with no evidence of dilitation. Venous: The inferior vena cava is normal in size with greater than 50% respiratory variability, suggesting right atrial pressure of 3 mmHg. IAS/Shunts: The atrial septum is grossly normal.  LEFT VENTRICLE PLAX 2D LVIDd:         4.50 cm      Diastology LVIDs:         3.60 cm      LV e' medial:    7.62 cm/s LV PW:         1.35 cm      LV E/e' medial:  14.4 LV IVS:        1.42 cm      LV e' lateral:   5.11 cm/s                             LV E/e' lateral: 21.5  LV Volumes (MOD) LV vol d, MOD A2C: 118.0 ml LV vol d, MOD A4C: 117.0 ml LV vol s, MOD A2C: 66.7 ml LV vol s, MOD A4C: 69.4 ml LV SV MOD A2C:     51.3 ml LV SV MOD A4C:     117.0 ml LV SV MOD BP:      49.1 ml RIGHT VENTRICLE            IVC RV S prime:     9.57 cm/s  IVC diam: 1.50 cm TAPSE (M-mode): 1.8 cm LEFT ATRIUM             Index       RIGHT ATRIUM  Index LA diam:        2.90 cm 1.53 cm/m  RA Area:     7.36 cm LA Vol (A2C):   47.5 ml 24.99 ml/m RA Volume:   13.60 ml 7.16 ml/m LA Vol (A4C):   20.2 ml 10.63 ml/m LA Biplane Vol: 31.7 ml 16.68 ml/m  AORTIC VALVE LVOT Vmax:   93.80 cm/s LVOT Vmean:  68.900 cm/s LVOT VTI:    0.157 m  AORTA Ao Root diam: 2.80 cm Ao Asc diam:  3.50 cm MITRAL VALVE MV Area (PHT): 4.49 cm     SHUNTS MV Decel Time: 169 msec     Systemic  VTI: 0.16 m MV E velocity: 110.00 cm/s MV A velocity: 61.60 cm/s MV E/A ratio:  1.79 Eleonore Chiquito MD Electronically signed by Eleonore Chiquito MD Signature Date/Time: 02/08/2021/10:55:01 AM    Final      Scheduled Meds:   .  stroke: mapping our early stages of recovery book   Does not apply Once  . aspirin EC  81 mg Oral Daily  . [START ON 02/09/2021] atorvastatin  80 mg Oral Daily  . carvedilol  3.125 mg Oral BID WC  . clopidogrel  75 mg Oral Daily  . enoxaparin (LOVENOX) injection  40 mg Subcutaneous Q24H  . feeding supplement  237 mL Oral BID BM  . insulin aspart  0-9 Units Subcutaneous TID WC  . insulin aspart  4 Units Subcutaneous TID WC  . insulin glargine  10 Units Subcutaneous Daily  . omega-3 acid ethyl esters  1 g Oral Daily  . pregabalin  150 mg Oral BID  . traZODone  100 mg Oral QHS    Continuous Infusions:     LOS: 0 days     Vernell Leep, MD, [place], [facility]. [facility]    To contact the attending provider between 7A-7P or the covering provider during after hours 7P-7A, please log into the web site [email or web address] and access using universal [place] password for that web site. If you do not have the password, please call the hospital operator.  02/08/2021, 2:07 PM

## 2021-02-08 NOTE — Evaluation (Signed)
Physical Therapy Evaluation Patient Details Name: Sheila Guerrero MRN: [id number] DOB: 1961/08/17 Today's Date: 02/08/2021   History of Present Illness  This is a 60 year old female with past medical history of angioedema, hypertension, hyperlipidemia, type 2 diabetes, anxiety, depression, type 2 diabetes, CVA in 2017 who presented to the ED with 4 weeks of left-sided paresthesias with progressive weakness on the left side.  Patient states that she had sudden onset left-sided paresthesias several weeks ago and was seen by her PCP and was initially thought to be neuropathy and was prescribed Lyrica.  She had minimal improvement in symptoms and continued to have worsening neuropathic pain and so was seen by a pain management specialist who was concern for an acute stroke and referred her to neurology.  She was seen by Dr. Krista Blue, neurology, on 3/7 who was concern for a right thalamic infarct but could not get her in for an MRI until next week.  She continued to have left-sided sensory changes of her face, body, upper and lower extremity and head new onset right lower extremity sciatica with discomfort to right knee.  Admits to some left ankle weakness as well.   After her stroke in 2017 she reports that she gets lost easily in well known areas and difficulty finding objects as well difficulty with time and date. MRI showed Small acute to early subacute infarct in the right  thalamus/posterior limb of internal capsule. and Small subacute left occipital lobe infarct.  Clinical Impression  Sheila Guerrero is a 60 year old woman who presents with acute to subacute CVAs with a history of an old CVA in 2017. She reports since her prior stroke she has had problems with getting lost, finding objects, difficulties with remembering dates and times, difficulty with reading but still functional - driving, performing IADLs and taking care of her parents. Today patient presents with decreased strength of left upper  and lower extremities (compared to right), impaired sensation (reporting decreased light touch, sensitivity, transient feelings of tightness and pain in arm and ribs), L LE "feeling like a stump when I walk", decreased coordination in upper and lower extremity, impaired balance, decreased vision, and reports of cognitive impairment surrounding memory, dates and times. On evaluation patient able to perform ADLs with increased time and increased difficulty but still functional. She reports she can't hold a cup in her left hand for very long before she tips it over and that she is unable to load or unload the dishes (unable to maneuver dished without dropping). She demonstrates ability to ambulate but did have loss of balance with turning and with higher level balance assessment. She is at high risk for falls. In regards to vision she appears to have some degrees of bilateral peripheral vision loss - unsure if this is in the last 4 weeks or from prior stroke. Patient does well with compensating with turning her head and eyes to scan. In regards to prior difficulty with getting lost, difficulty navigating in new and familiar places (her home, reporting if she sleeps "hard" the night before she can't remember the layout of her home), difficulty remembering times and dates (she also reports the "feeling" of time") - therapist questions possible visual memory deficits. At this time therapist recommends patient be referred to [facility] for [facility] OT, PT and SLP for formal cognitive evaluation. Therapist also recommend patient to f/u with her optometrist and/or a neurological optometrist. Patient verbalized understanding of all instructions, recommendations and POC.  Follow Up Recommendations Outpatient PT (OP PT)    Equipment Recommendations  None recommended by PT    Recommendations for Other Services       Precautions / Restrictions Precautions Precautions: Fall Restrictions Weight Bearing  Restrictions: No      Mobility  Bed Mobility Overal bed mobility: Modified Independent                  Transfers Overall transfer level: Modified independent Equipment used: None Transfers: Sit to/from [facility] Sit to Stand: Modified independent (Device/Increase time) Stand pivot transfers: Modified independent (Device/Increase time)       General transfer comment: Pt to standing with min use of UEs and standing at side of bed eating bfast  Ambulation/Gait Ambulation/Gait assistance: Min assist;Min guard;Supervision Gait Distance (Feet): 400 Feet Assistive device: None Gait Pattern/deviations: Step-through pattern;Shuffle;Trunk flexed;Wide base of support;Antalgic     General Gait Details: Pt ambulated entire circuit of hall but with noted increased shuffling and decreased stride length on L with increasing fatigue.  Mild trendelenburg gait noted and one episode LOB when pt unfocused on task.  Stairs            Wheelchair Mobility    Modified Rankin (Stroke Patients Only)       Balance Overall balance assessment: Needs assistance Sitting-balance support: No upper extremity supported;Feet supported Sitting balance-Leahy Scale: Good     Standing balance support: No upper extremity supported Standing balance-Leahy Scale: Good Standing balance comment: Good balance with pt focused on task but deteriorates with pt distracted                             Pertinent Vitals/Pain Pain Assessment: 0-10 Pain Score: 8  Pain Location: R buttock referred to lateral knee Pain Descriptors / Indicators: Sharp;Restless Pain Intervention(s): Limited activity within patient's tolerance    Home Living Family/patient expects to be discharged to:: Private residence Living Arrangements: Spouse/significant other Available Help at Discharge: Family;Available PRN/intermittently Type of Home: House Home Access: Stairs to enter   [employer] of Steps: 2 Home Layout: One level Home Equipment: Cane - single point      Prior Function Level of Independence: Independent with assistive device(s)         Comments: use of cane for mobility. Patient reports decreased memory and difficulty with time/date and navigation (gets lost easily)     Hand Dominance   Dominant Hand: Right    Extremity/Trunk Assessment   Upper Extremity Assessment Upper Extremity Assessment: RUE deficits/detail RUE Deficits / Details: WNL ROM 5/5 strength RUE Sensation: WNL RUE Coordination: WNL LUE Deficits / Details: WNL ROM, 4+/5 strength compared to left. Reports she is unable to lift and carry dishes (unload dishwasher) with LUE, LUE Coordination: decreased gross motor    Lower Extremity Assessment Lower Extremity Assessment: LLE deficits/detail;RLE deficits/detail RLE Deficits / Details: Strength/ROM/coordination WNL LLE Deficits / Details: Strength at knee and ankle 4+/5 but 4/5 at hip; Pt states it feels like a stump when she is walking on it LLE Sensation: decreased proprioception;decreased light touch LLE Coordination: decreased fine motor    Cervical / Trunk Assessment Cervical / Trunk Assessment: Normal  Communication   Communication: No difficulties  Cognition Arousal/Alertness: Awake/alert Behavior During Therapy: WFL for tasks assessed/performed Overall Cognitive Status: Within Functional Limits for tasks assessed  General Comments: Reports difficulty with memory - remembering times, datess, concept of time. Using calendars, phone and reminders to assist with keeping up with appointments. Reports she gets lost driving, in stores (recalls that she got so frustrated that she couldn't fiind the potatoes or problem solve how to locate potatoes she just left the store), forgets layout of home (if she sleeps "hard" the night before).      General Comments General comments  (skin integrity, edema, etc.): Pt has been using cane for four weeks for stability since last CVA    Exercises     Assessment/Plan    PT Assessment Patient needs continued PT services  PT Problem List Decreased strength;Decreased activity tolerance;Decreased balance;Decreased mobility;Decreased coordination;Decreased cognition;Decreased knowledge of use of DME;Pain;Decreased safety awareness       PT Treatment Interventions DME instruction;Gait training;Stair training;Functional mobility training;Therapeutic activities;Therapeutic exercise;Balance training;Neuromuscular re-education;Patient/family education    PT Goals (Current goals can be found in the Care Plan section)  Acute Rehab PT Goals Patient Stated Goal: Regain IND PT Goal Formulation: With patient Time For Goal Achievement: 02/22/21 Potential to Achieve Goals: Good    Frequency Min 3X/week   Barriers to discharge        Co-evaluation PT/OT/SLP Co-Evaluation/Treatment: Yes Reason for Co-Treatment: For patient/therapist safety PT goals addressed during session: Mobility/safety with mobility OT goals addressed during session: ADL's and self-care       AM-PAC PT "6 Clicks" Mobility  Outcome Measure Help needed turning from your back to your side while in a flat bed without using bedrails?: None Help needed moving from lying on your back to sitting on the side of a flat bed without using bedrails?: None Help needed moving to and from a bed to a chair (including a wheelchair)?: None Help needed standing up from a chair using your arms (e.g., wheelchair or bedside chair)?: None Help needed to walk in hospital room?: A Little Help needed climbing 3-5 steps with a railing? : A Little 6 Click Score: 22    End of Session Equipment Utilized During Treatment: Gait belt Activity Tolerance: Patient tolerated treatment well;Patient limited by fatigue Patient left: in chair;with call bell/phone within reach Nurse  Communication: Mobility status PT Visit Diagnosis: Unsteadiness on feet (R26.81);Difficulty in walking, not elsewhere classified (R26.2);Hemiplegia and hemiparesis Hemiplegia - Right/Left: Left Hemiplegia - dominant/non-dominant: Non-dominant Hemiplegia - caused by: Cerebral infarction    Time: 3536-1443 PT Time Calculation (min) (ACUTE ONLY): 39 min   Charges:   PT Evaluation $PT Eval Moderate Complexity: 1 Mod          [place] Pager [phone] Office [phone]   Rodolphe Edmonston 02/08/2021, 2:07 PM

## 2021-02-08 NOTE — Consult Note (Addendum)
Referring Physician: Dr. Neysa Bonito    Chief Complaint: Left sided numbness and weakness  HPI: Sheila Guerrero is an 60 y.o. female with a PMHx of DM2, HTN, stroke in 2017, panic attacks, situational stress and dyslipidemia who presented to the ED on Saturday morning with a chief complaint of left sided numbness and tingling x 4 weeks in conjunction with progressive left sided weakness. The patient's symptoms began on February 14 with sudden onset of left face, arm and leg numbness in addition to tingling and weakness with some gait abnormality. She was seen by her PCP for this and was prescribed Lyrica for possible neuropathy. The Lyrica did not improve her symptoms, so she went to her Neurologist, Dr. Krista Blue, who ordered an MRI which was to be done this Tuesday. Dr. Krista Blue also switched the patient from her daily ASA, which she had been taking since her stroke in 2017, to Plavix. Per Dr. Rhea Belton note, the patient's prior MRI of the brain in 2018 was consistent with chronic small vessel disease with evidence of right dominant chronic cerebral microhemorrhage and chronic lacunar infarct. EEG in 2018 was normal.  The patient also complains of right sided "sciatica pain" due to favoring of her right side in the context of the above weakness. The pain extends along the anterolateral aspect of her right thigh to the lateral aspect of her knee.   MRI brain at [facility] revealed a right thalamic subacute ischemic infarction as well as a small subacute left occipital lobe ischemic infarction. Numerous chronic microhemorrhages appearing most consistent with chronic hypertension were seen. Moderately advanced chronic small vessel ischemic changes were also noted.    MRI brain: 1. Small acute to early subacute infarct in the right thalamus/posterior limb of internal capsule. 2. Small subacute left occipital lobe infarct. 3. Moderately advanced chronic small vessel ischemic disease, progressed from 2018. 4. Numerous chronic  microhemorrhages consistent with chronic hypertension.   Past Medical History:  Diagnosis Date  . Angioedema   . Anxiety   . Asthma   . Depression   . Diabetes mellitus, type II ([place])   . Dyslipidemia   . History of CVA (cerebrovascular accident)   . HTN (hypertension) 2013  . Panic attacks   . Pneumonia   . Situational stress     Past Surgical History:  Procedure Laterality Date  . CHOLECYSTECTOMY  2010    Family History  Problem Relation Age of Onset  . Hypertension Mother   . Other Mother        blood disorder  . Hypertension Father   . Stroke Father   . Diabetes Maternal Aunt    Social History:  reports that she has never smoked. She has never used smokeless tobacco. She reports current alcohol use. She reports that she does not use drugs.  Allergies:  Allergies  Allergen Reactions  . Sulfa Antibiotics Swelling  . Dulaglutide     Other reaction(s): rash  . Empagliflozin     Other reaction(s): recurrent yeast  . Iodinated Diagnostic Agents     Other reaction(s): angioedema with CT dye  . Latex Rash    Medications:  Prior to Admission:  Medications Prior to Admission  Medication Sig Dispense Refill Last Dose  . acetaminophen (TYLENOL) 500 MG tablet Take 1,000 mg by mouth every 6 (six) hours as needed for moderate pain.   02/04/2021  . Ascorbic Acid (VITAMIN C) 500 MG CAPS Take 500 mg by mouth daily.   01/24/2021  . atorvastatin (LIPITOR) 20 MG  tablet Take 20 mg by mouth daily.   02/06/2021 at Unknown time  . CALCIUM-VITAMIN D PO Take 1 tablet by mouth daily.   01/24/2021  . Cholecalciferol (VITAMIN D-3) 5000 UNIT/ML LIQD Take 1 tablet by mouth daily.   01/24/2021  . CINNAMON PO Take 1 tablet by mouth daily.   01/24/2021  . clopidogrel (PLAVIX) 75 MG tablet Take 1 tablet (75 mg total) by mouth daily. 30 tablet 11 02/06/2021 at 9 am  . EPINEPHrine 0.3 mg/0.3 mL IJ SOAJ injection Inject 0.3 mg into the muscle daily as needed.   unk last dose  . Flaxseed, Linseed,  (FLAX SEED OIL) 1000 MG CAPS Take 1 capsule by mouth daily.   01/24/2021  . lisinopril (ZESTRIL) 20 MG tablet Take 20 mg by mouth daily.   02/06/2021 at Unknown time  . MAGNESIUM GLUCONATE PO Take 1 tablet by mouth daily.   01/24/2021  . melatonin 3 MG TABS tablet Take 3 mg by mouth daily as needed.   Past Month at Unknown time  . metFORMIN (GLUCOPHAGE-XR) 500 MG 24 hr tablet Take 2,000 mg by mouth daily.   02/06/2021 at Unknown time  . OIL OF OREGANO PO Take 1 capsule by mouth daily.   01/24/2021  . OLIVE LEAF EXTRACT PO Take 1 capsule by mouth daily.   01/24/2021  . omega-3 fish oil (MAXEPA) 1000 MG CAPS capsule Take 1 capsule by mouth daily.   01/24/2021  . pregabalin (LYRICA) 75 MG capsule Take 150 mg by mouth 2 (two) times daily.   02/07/2021 at 3 am  . sodium chloride (OCEAN) 0.65 % SOLN nasal spray Place 1 spray into both nostrils daily as needed for congestion.   Past Month at Unknown time  . Tragacanth (ASTRAGALUS ROOT) POWD Take 1 Dose by mouth daily.   01/24/2021  . traMADol (ULTRAM) 50 MG tablet Take 50 mg by mouth every 12 (twelve) hours as needed for severe pain.   02/07/2021 at 3 am  . Turmeric 500 MG CAPS Take 1 tablet by mouth daily.   01/24/2021  . traZODone (DESYREL) 100 MG tablet Take 100 mg by mouth at bedtime.   past week   Scheduled: .  stroke: mapping our early stages of recovery book   Does not apply Once  . atorvastatin  40 mg Oral Daily  . clopidogrel  75 mg Oral Daily  . enoxaparin (LOVENOX) injection  40 mg Subcutaneous Q24H  . feeding supplement  237 mL Oral BID BM  . insulin aspart  0-9 Units Subcutaneous TID WC  . insulin aspart  4 Units Subcutaneous TID WC  . insulin glargine  10 Units Subcutaneous Daily  . lisinopril  20 mg Oral Daily  . pregabalin  75 mg Oral Daily  . traZODone  100 mg Oral QHS    ROS: No F/C, N/V, CP, headache or vision changes. Other ROS as per HPI.   Physical Examination: Blood pressure (!) 155/87, pulse (!) 108, temperature 98.7 F (37.1  C), temperature source Oral, resp. rate 19, height 5\' 5"  (1.651 m), weight 82.6 kg, last menstrual period 11/25/2011, SpO2 100 %.  HEENT: [place]/AT Lungs: Respirations unlabored Ext: No edema  Neurologic Examination: Mental Status: Awake and alert. Oriented x 5. Speech fluent with intact naming and comprehension. No dysarthria.  Cranial Nerves: II:  Temporal visual fields intact with no extinction to DSS. PERRL.  III,IV, VI: EOMI. No nystagmus. No ptosis.  V,VII: Smile symmetric, facial temp sensation is equal bilaterally  VIII:  hearing intact to conversation IX,X: No hypophonia.  XI: Shoulder shrug is symmetric.  XII: Midline tongue extension  Motor: RUE 5/5 proximally and distally RLE 5/5 proximally and distally, except for ADF which is 4/5 LUE 5/5 proximally and distally LLE 4+/5 hip flexion, otherwise 5/5 Sensory: Decreased temp sensation to LUE and LLE. FT intact x 4. No extinction to DSS.    Deep Tendon Reflexes:  Right biceps and brachioradialis 2+ Left biceps and brachioradialis 1+ Right patella 2+ Left patella 1+ Cerebellar: FNF normal on the right. Left FNF with mild ataxia.  Gait: Deferred  Results for orders placed or performed during the hospital encounter of 02/07/21 (from the past 48 hour(s))  Protime-INR     Status: None   Collection Time: 02/07/21 11:45 AM  Result Value Ref Range   Prothrombin Time 12.8 11.4 - 15.2 seconds   INR 1.0 0.8 - 1.2    Comment: (NOTE) INR goal varies based on device and disease states. Performed at [facility], [place] [address], [place], [place] [address]   APTT     Status: None   Collection Time: 02/07/21 11:45 AM  Result Value Ref Range   aPTT 26 24 - 36 seconds    Comment: Performed at [facility], [place] [address], [place], [place] [address]  CBC     Status: Abnormal   Collection Time: 02/07/21 11:45 AM  Result Value Ref Range   WBC 9.0 4.0 - 10.5 K/uL   RBC 5.32 (H) 3.87 - 5.11 MIL/uL    Hemoglobin 15.7 (H) 12.0 - 15.0 g/dL   HCT 45.8 36.0 - 46.0 %   MCV 86.1 80.0 - 100.0 fL   MCH 29.5 26.0 - 34.0 pg   MCHC 34.3 30.0 - 36.0 g/dL   RDW 12.6 11.5 - 15.5 %   Platelets 301 150 - 400 K/uL   nRBC 0.0 0.0 - 0.2 %    Comment: Performed at [facility], [place] [address] [place], [place] [address]  Differential     Status: None   Collection Time: 02/07/21 11:45 AM  Result Value Ref Range   Neutrophils Relative % 66 %   Neutro Abs 6.0 1.7 - 7.7 K/uL   Lymphocytes Relative 25 %   Lymphs Abs 2.2 0.7 - 4.0 K/uL   Monocytes Relative 7 %   Monocytes Absolute 0.7 0.1 - 1.0 K/uL   Eosinophils Relative 1 %   Eosinophils Absolute 0.1 0.0 - 0.5 K/uL   Basophils Relative 1 %   Basophils Absolute 0.1 0.0 - 0.1 K/uL   Immature Granulocytes 0 %   Abs Immature Granulocytes 0.03 0.00 - 0.07 K/uL    Comment: Performed at [facility], [place] [address], [place], [place] [address]  Comprehensive metabolic panel     Status: Abnormal   Collection Time: 02/07/21 11:45 AM  Result Value Ref Range   Sodium 139 135 - 145 mmol/L   Potassium 3.4 (L) 3.5 - 5.1 mmol/L   Chloride 105 98 - 111 mmol/L   CO2 25 22 - 32 mmol/L   Glucose, Bld 298 (H) 70 - 99 mg/dL    Comment: Glucose reference range applies only to samples taken after fasting for at least 8 hours.   BUN 18 6 - 20 mg/dL   Creatinine, Ser 0.53 0.44 - 1.00 mg/dL   Calcium 9.2 8.9 - 10.3 mg/dL   Total Protein 6.8 6.5 - 8.1 g/dL   Albumin 3.8 3.5 -  5.0 g/dL   AST 16 15 - 41 U/L   ALT 21 0 - 44 U/L   Alkaline Phosphatase 71 38 - 126 U/L   Total Bilirubin 0.8 0.3 - 1.2 mg/dL   GFR, Estimated >60 >60 mL/min    Comment: (NOTE) Calculated using the CKD-EPI Creatinine Equation (2021)    Anion gap 9 5 - 15    Comment: Performed at [facility], [place] [address], [place], [place] [address]  I-Stat beta hCG blood, ED     Status: Abnormal   Collection Time: 02/07/21 12:00 PM  Result Value  Ref Range   I-stat hCG, quantitative 6.7 (H) <5 mIU/mL   Comment 3            Comment:   GEST. AGE      CONC.  (mIU/mL)   <=1 WEEK        5 - 50     2 WEEKS       50 - 500     3 WEEKS       100 - 10,000     4 WEEKS     1,000 - 30,000        FEMALE AND NON-PREGNANT FEMALE:     LESS THAN 5 mIU/mL   Urine rapid drug screen (hosp performed)     Status: Abnormal   Collection Time: 02/07/21  1:57 PM  Result Value Ref Range   Opiates POSITIVE (A) NONE DETECTED   Cocaine NONE DETECTED NONE DETECTED   Benzodiazepines NONE DETECTED NONE DETECTED   Amphetamines NONE DETECTED NONE DETECTED   Tetrahydrocannabinol NONE DETECTED NONE DETECTED   Barbiturates NONE DETECTED NONE DETECTED    Comment: (NOTE) DRUG SCREEN FOR MEDICAL PURPOSES ONLY.  IF CONFIRMATION IS NEEDED FOR ANY PURPOSE, NOTIFY LAB WITHIN 5 DAYS.  LOWEST DETECTABLE LIMITS FOR URINE DRUG SCREEN Drug Class                     Cutoff (ng/mL) Amphetamine and metabolites    1000 Barbiturate and metabolites    200 Benzodiazepine                 756 Tricyclics and metabolites     300 Opiates and metabolites        300 Cocaine and metabolites        300 THC                            50 Performed at [facility], [place] [address] [place], [place] [address]   Urinalysis, Routine w reflex microscopic     Status: Abnormal   Collection Time: 02/07/21  1:57 PM  Result Value Ref Range   Color, Urine YELLOW YELLOW   APPearance HAZY (A) CLEAR   Specific Gravity, Urine 1.026 1.005 - 1.030   pH 5.0 5.0 - 8.0   Glucose, UA 150 (A) NEGATIVE mg/dL   Hgb urine dipstick NEGATIVE NEGATIVE   Bilirubin Urine NEGATIVE NEGATIVE   Ketones, ur 5 (A) NEGATIVE mg/dL   Protein, ur NEGATIVE NEGATIVE mg/dL   Nitrite NEGATIVE NEGATIVE   Leukocytes,Ua MODERATE (A) NEGATIVE   RBC / HPF 21-50 0 - 5 RBC/hpf   WBC, UA 21-50 0 - 5 WBC/hpf   Bacteria, UA NONE SEEN NONE SEEN   Squamous Epithelial / LPF 0-5 0 - 5   Mucus PRESENT    Ca  Oxalate Crys, UA PRESENT  Comment: Performed at [facility], [place] [address], [place], [place] [address]  Resp Panel by RT-PCR (Flu A&B, Covid) Nasopharyngeal Swab     Status: None   Collection Time: 02/07/21  3:00 PM   Specimen: Nasopharyngeal Swab; Nasopharyngeal(NP) swabs in vial transport medium  Result Value Ref Range   SARS Coronavirus 2 by RT PCR NEGATIVE NEGATIVE    Comment: (NOTE) SARS-CoV-2 target nucleic acids are NOT DETECTED.  The SARS-CoV-2 RNA is generally detectable in upper respiratory specimens during the acute phase of infection. The lowest concentration of SARS-CoV-2 viral copies this assay can detect is 138 copies/mL. A negative result does not preclude SARS-Cov-2 infection and should not be used as the sole basis for treatment or other patient management decisions. A negative result may occur with  improper specimen collection/handling, submission of specimen other than nasopharyngeal swab, presence of viral mutation(s) within the areas targeted by this assay, and inadequate number of viral copies(<138 copies/mL). A negative result must be combined with clinical observations, patient history, and epidemiological information. The expected result is Negative.  Fact Sheet for Patients:  EntrepreneurPulse.com.au  Fact Sheet for Healthcare Providers:  IncredibleEmployment.be  This test is no t yet approved or cleared by the [place] FDA and  has been authorized for detection and/or diagnosis of SARS-CoV-2 by FDA under an Emergency Use Authorization (EUA). This EUA will remain  in effect (meaning this test can be used) for the duration of the COVID-19 declaration under Section 564(b)(1) of the Act, 21 U.S.C.section 360bbb-3(b)(1), unless the authorization is terminated  or revoked sooner.       Influenza A by PCR NEGATIVE NEGATIVE   Influenza B by PCR NEGATIVE NEGATIVE    Comment: (NOTE) The Xpert  Xpress SARS-CoV-2/FLU/RSV plus assay is intended as an aid in the diagnosis of influenza from Nasopharyngeal swab specimens and should not be used as a sole basis for treatment. Nasal washings and aspirates are unacceptable for Xpert Xpress SARS-CoV-2/FLU/RSV testing.  Fact Sheet for Patients: EntrepreneurPulse.com.au  Fact Sheet for Healthcare Providers: IncredibleEmployment.be  This test is not yet approved or cleared by the [place] FDA and has been authorized for detection and/or diagnosis of SARS-CoV-2 by FDA under an Emergency Use Authorization (EUA). This EUA will remain in effect (meaning this test can be used) for the duration of the COVID-19 declaration under Section 564(b)(1) of the Act, 21 U.S.C. section 360bbb-3(b)(1), unless the authorization is terminated or revoked.  Performed at [facility], [place] [address], [place], [place] [address]   Glucose, capillary     Status: Abnormal   Collection Time: 02/07/21  5:37 PM  Result Value Ref Range   Glucose-Capillary 309 (H) 70 - 99 mg/dL    Comment: Glucose reference range applies only to samples taken after fasting for at least 8 hours.   MR ANGIO HEAD WO CONTRAST  Result Date: 02/07/2021 CLINICAL DATA:  Stroke. EXAM: MRA HEAD WITHOUT CONTRAST TECHNIQUE: Angiographic images of the Circle of Willis were obtained using MRA technique without intravenous contrast. COMPARISON:  None. FINDINGS: The visualized distal vertebral arteries are patent to the basilar with the left being dominant. There is moderate irregular narrowing of the distal right V4 segment. Patent PICAs and SCAs are seen bilaterally with evidence of a severe stenosis of the origin of the right SCA. The basilar artery is widely patent. Both PCAs are patent without evidence of a significant proximal stenosis. The internal carotid arteries are widely patent from skull base to  carotid termini. ACAs and MCAs are  patent with mild branch vessel irregularity but without evidence of a proximal branch occlusion or significant A1 or left M1 stenosis. There is a severe distal right M1 stenosis. An accessory left MCA and azygos A2 are noted, normal variants. No aneurysm is identified. IMPRESSION: 1. Intracranial atherosclerosis including severe right M1 and moderate right V4 stenoses. 2. No large vessel occlusion. Electronically Signed   By: Logan Bores M.D.   On: 02/07/2021 18:20   MR Brain Wo Contrast (neuro protocol)  Result Date: 02/07/2021 CLINICAL DATA:  Left-sided weakness for 4 weeks. EXAM: MRI HEAD WITHOUT CONTRAST TECHNIQUE: Multiplanar, multiecho pulse sequences of the brain and surrounding structures were obtained without intravenous contrast. COMPARISON:  03/02/2017 FINDINGS: Brain: There is an approximately 1.5 cm acute to early subacute infarct involving the lateral aspect of the right thalamus and posterior limb of the right internal capsule. There is also likely a small subacute infarct in the left occipital lobe. There are multiple chronic microhemorrhages scattered throughout the cerebrum and cerebellum including prominent bi thalamic involvement as well as in the pons with the distribution suggestive of chronic hypertension. T2 hyperintensities in the cerebral white matter bilaterally have progressed and are nonspecific but compatible with moderately age advanced chronic small vessel ischemic disease given the patient's vascular risk factors. There are chronic lacunar infarcts in the deep cerebral white matter bilaterally. The ventricles and sulci are within normal limits for age. No mass, midline shift, or extra-axial fluid collection is identified. Vascular: Major intracranial vascular flow voids are preserved. Skull and upper cervical spine: Unremarkable bone marrow signal. Sinuses/Orbits: Unremarkable orbits. Paranasal sinuses and mastoid air cells are clear. Other: None. IMPRESSION: 1. Small acute to  early subacute infarct in the right thalamus/posterior limb of internal capsule. 2. Small subacute left occipital lobe infarct. 3. Moderately advanced chronic small vessel ischemic disease, progressed from 2018. 4. Numerous chronic microhemorrhages consistent with chronic hypertension. Electronically Signed   By: Logan Bores M.D.   On: 02/07/2021 13:28    Assessment: 60 y.o. female presenting for evaluation of a 4-week history of left sided weakness and numbness 1. Exam reveals left sided sensory loss and mild ataxia. 2. MRI brain reveals a right thalamic subacute ischemic infarction as well as a small subacute left occipital lobe ischemic infarction.  3. Also seen on MRI are numerous chronic microhemorrhages appearing most likely to be secondary to chronic hypertension. A background of moderately advanced chronic small vessel ischemic changes is noted. 4. MRA head: Intracranial atherosclerosis including severe right M1 and moderate right V4 stenoses.  5. EKG: Sinus tachycardia. Borderline T abnormalities, inferior leads. Borderline prolonged QT interval. No significant change since last tracing 6. Stroke Risk Factors -  DM2, HTN and prior stroke 7. DDx for underlying etiology for her stroke includes cardioembolic given two strokes in separate vascular territories.   Recommendations: 1. HgbA1c 2. Carotid ultrasound 3. PT consult, OT consult, Speech consult 4. TTE. If negative will need a TEE; in that event she will need to be transferred to [facility] due to the complexity of the stroke workup.  5. Prophylactic therapy- Add ASA to Plavix for DAPT. This is indicated due to the severe intracranial atherosclerotic narrowing revealed by MRA 6. If no atrial fibrillation is seen on telemetry, will need a loop recorder to assess for possible paroxysmal atrial fibrillation 7. Risk factor modification 8. Continue atorvastatin, increase to 80 mg po qd. Obtain baseline CK level 9. BP  management   @  Electronically signed: Dr. Kerney Elbe 02/08/2021, 12:37 AM

## 2021-02-08 NOTE — TOC Initial Note (Signed)
Transition of Care [facility]) - Initial/Assessment Note    Patient Details  Name: Sheila Guerrero MRN: [id number] Date of Birth: 01-30-1961  Transition of Care (TOC) CM/SW Contact:    Joaquin Courts, RN Phone Number: 02/08/2021, 1:11 PM  Clinical Narrative:                 Referral made for Outpatient PT/OT services and cognitive speech evaluation/ follow up per [facility] consult.   Expected Discharge Plan: Home/Self Care     Patient Goals and CMS Choice Patient states their goals for this hospitalization and ongoing recovery are:: to go home      Expected Discharge Plan and Services Expected Discharge Plan: Home/Self Care   Discharge Planning Services: CM Consult   Living arrangements for the past 2 months: Single Family Home                                      Prior Living Arrangements/Services Living arrangements for the past 2 months: Single Family Home   Patient language and need for interpreter reviewed:: Yes Do you feel safe going back to the place where you live?: Yes            Criminal Activity/Legal Involvement Pertinent to Current Situation/Hospitalization: No - Comment as needed  Activities of Daily Living Home Assistive Devices/Equipment: Cane (specify quad or straight) ADL Screening (condition at time of admission) Patient's cognitive ability adequate to safely complete daily activities?: Yes Is the patient deaf or have difficulty hearing?: No Does the patient have difficulty seeing, even when wearing glasses/contacts?: No Does the patient have difficulty concentrating, remembering, or making decisions?: Yes Patient able to express need for assistance with ADLs?: Yes Does the patient have difficulty dressing or bathing?: No Independently performs ADLs?: Yes (appropriate for developmental age) Does the patient have difficulty walking or climbing stairs?: Yes Weakness of Legs: Left Weakness of Arms/Hands: Left  Permission Sought/Granted                   Emotional Assessment Appearance:: Appears stated age         Psych Involvement: No (comment)  Admission diagnosis:  Stroke (cerebrum) ([facility]) [I63.9] Infarction of right thalamus [facility]) [I63.9] Patient Active Problem List   Diagnosis Date Noted  . Right thalamic infarction ([place]) 02/07/2021  . Essential hypertension 02/07/2021  . Paresthesia 02/02/2021  . Gait abnormality 02/02/2021  . Confusion 05/08/2018  . Stroke ([place]) 03/17/2017  . Type 2 diabetes mellitus with hyperlipidemia ([place]) 03/17/2017  . Confusion state 02/16/2017  . Angioedema 02/16/2017  . Panic 02/04/2017  . Adjustment disorder with anxious mood 02/04/2017  . Sinusitis, chronic 12/28/2011  . Rhinitis, allergic 12/28/2011  . Extrinsic asthma, unspecified 12/07/2011   PCP:  Donald Prose, MD Pharmacy:   [id number] - [place], [name] [address] Phone: [phone] Fax: [phone]     Social Determinants of Health (SDOH) Interventions    Readmission Risk Interventions No flowsheet data found.

## 2021-02-09 ENCOUNTER — Inpatient Hospital Stay (HOSPITAL_COMMUNITY): Payer: 59

## 2021-02-09 DIAGNOSIS — E440 Moderate protein-calorie malnutrition: Secondary | ICD-10-CM | POA: Insufficient documentation

## 2021-02-09 DIAGNOSIS — I639 Cerebral infarction, unspecified: Secondary | ICD-10-CM

## 2021-02-09 LAB — HEMOGLOBIN A1C
Hgb A1c MFr Bld: 12.1 % — ABNORMAL HIGH (ref 4.8–5.6)
Mean Plasma Glucose: 300.57 mg/dL

## 2021-02-09 LAB — GLUCOSE, CAPILLARY
Glucose-Capillary: 390 mg/dL — ABNORMAL HIGH (ref 70–99)
Glucose-Capillary: 127 mg/dL — ABNORMAL HIGH (ref 70–99)
Glucose-Capillary: 267 mg/dL — ABNORMAL HIGH (ref 70–99)
Glucose-Capillary: 200 mg/dL — ABNORMAL HIGH (ref 70–99)

## 2021-02-09 MED ORDER — ADULT MULTIVITAMIN W/MINERALS CH
1.0000 | ORAL_TABLET | Freq: Every day | ORAL | Status: DC
  Administered 2021-02-09 – 2021-02-12 (×4): 1 via ORAL
  Filled 2021-02-09 (×4): qty 1

## 2021-02-09 MED ORDER — TOPIRAMATE 25 MG PO TABS
50.0000 mg | ORAL_TABLET | Freq: Two times a day (BID) | ORAL | Status: DC
  Administered 2021-02-09 – 2021-02-12 (×7): 50 mg via ORAL
  Filled 2021-02-09 (×7): qty 2

## 2021-02-09 MED ORDER — LIVING WELL WITH DIABETES BOOK
Freq: Once | Status: AC
  Filled 2021-02-09: qty 1

## 2021-02-09 MED ORDER — INSULIN STARTER KIT- PEN NEEDLES (ENGLISH)
1.0000 | Freq: Once | Status: AC
  Administered 2021-02-09: 1
  Filled 2021-02-09: qty 1

## 2021-02-09 MED ORDER — GADOBUTROL 1 MMOL/ML IV SOLN
8.0000 mL | Freq: Once | INTRAVENOUS | Status: AC | PRN
  Administered 2021-02-09: 8 mL via INTRAVENOUS

## 2021-02-09 MED ORDER — SACUBITRIL-VALSARTAN 24-26 MG PO TABS
1.0000 | ORAL_TABLET | Freq: Two times a day (BID) | ORAL | Status: DC
  Administered 2021-02-09 – 2021-02-12 (×7): 1 via ORAL
  Filled 2021-02-09 (×8): qty 1

## 2021-02-09 MED ORDER — DOCUSATE SODIUM 100 MG PO CAPS
100.0000 mg | ORAL_CAPSULE | Freq: Every day | ORAL | Status: DC
  Administered 2021-02-09 – 2021-02-12 (×4): 100 mg via ORAL
  Filled 2021-02-09 (×4): qty 1

## 2021-02-09 NOTE — Progress Notes (Addendum)
Inpatient Diabetes Program Recommendations  AACE/ADA: New Consensus Statement on Inpatient Glycemic Control (2015)  Target Ranges:  Prepandial:   less than 140 mg/dL      Peak postprandial:   less than 180 mg/dL (1-2 hours)      Critically ill patients:  140 - 180 mg/dL   Lab Results  Component Value Date   GLUCAP 127 (H) 02/09/2021   HGBA1C 9.3 07/25/2017    Review of Glycemic Control Results for Sheila Guerrero, Sheila Guerrero (MRN [id number]) as of 02/09/2021 15:54  Ref. Range 02/08/2021 07:25 02/08/2021 11:48 02/08/2021 16:21 02/08/2021 21:12 02/09/2021 10:29 02/09/2021 13:09  Glucose-Capillary Latest Ref Range: 70 - 99 mg/dL 269 (H) 270 (H) 239 (H) 178 (H) 390 (H) 127 (H)   Diabetes history: DM 2 Outpatient Diabetes medications: Metformin 2000 mg Qevening at supper Current orders for Inpatient glycemic control:  Lantus 10 units Novolog 0-15 units tid + hs  Novolog 4 units tid meal coverage  A1c October 2021 per pt report 13.5% only placed on metformin 2000 qevening at that time.  Inpatient Diabetes Program Recommendations:    - Increase Lantus to 16 units (0.1 units/kg)  Spoke with pt at bedside regarding glucose control at home. Pt reports seeing her PCP Physician assistant in October 2021 but had not seen anyone for 2 years prior to that. In October her A1c was 13.5% and was only placed on metformin 2000 mg Qsupper at that time. Pt is reportedly seeking a new PCP.  Pt also reported she had a silent stroke in 2017. She reports not being able to tolerate Jardiance due to angioedema. With in the past year pt has multiple stressors with her parents health, husbands work schedule, physically going through menopause effecting the current A1c level. Pt reports knowing how to control her glucose levels at one time through healthy diet reducing carbs and eating healthy fats.  Discussed importance of glucose control in the setting of her admitting diagnosis. Discussed the possible need for insulin at  time of d/c. Discussed A1c level back in October and discussed glucose and A1c goals. Showed pt how to operate insulin pen. Discussed s/s hypoglycemia and treatment.  RN brought insulin in room pt could not self inject at this time will try again for supper.  D/C: Levemir preferred Insulin pen needles order # [id number]  Thanks,  Tama Headings RN, MSN, BC-ADM Inpatient Diabetes Coordinator Team Pager [phone] (8a-5p)

## 2021-02-09 NOTE — Progress Notes (Addendum)
Physical Therapy Treatment Patient Details Name: Sheila Guerrero MRN: [id number] DOB: 04/20/1961 Today's Date: 02/09/2021    History of Present Illness Pt adm to [facility] 3/12 with several week history of paresthesias, pain, and progressive weakness on lt side. Pt seen by Dr. Krista Blue, outpatient neurology, on 3/7 with concern for rt thalamic infarct but unable to have MRI scheduled until 3/15. Pt developed right lower extremity sciatica. MRI showed small acute to early subacute infarct in the right  thalamus/posterior limb of internal capsule. and small subacute left occipital lobe infarct. Pt transferred to [facility] on 12/13. PMH -angioedema, htn, DM, anxiety, depression, CVA in 2017.    After stroke in 2017 pt reports that she gets lost easily in well known areas and difficulty finding objects as well difficulty with time and date.    PT Comments    Pt making progress with mobility and balance but not back to baseline. Continue to recommend OPPT at DC.    Follow Up Recommendations  Outpatient PT (OP PT)     Equipment Recommendations  None recommended by PT    Recommendations for Other Services       Precautions / Restrictions Precautions Precautions: Fall    Mobility  Bed Mobility               General bed mobility comments: Pt up with husband ambulating in hall    Transfers Overall transfer level: Modified independent Equipment used: None;Straight cane Transfers: Sit to/from [facility] Sit to Stand: Modified independent (Device/Increase time)         General transfer comment: Able to rise to standing unassisted  Ambulation/Gait Ambulation/Gait assistance: Min guard;Supervision Gait Distance (Feet): 400 Feet Assistive device: 1 person hand held assist Gait Pattern/deviations: Step-through pattern;Wide base of support;Decreased stride length Gait velocity: decr Gait velocity interpretation: 1.31 - 2.62 ft/sec, indicative of limited community  ambulator General Gait Details: Pt with slightly unsteady gait especially with turns or change in direction. No overt loss of balance.   Stairs             Wheelchair Mobility    Modified Rankin (Stroke Patients Only) Modified Rankin (Stroke Patients Only) Pre-Morbid Rankin Score: No significant disability Modified Rankin: Moderately severe disability     Balance Overall balance assessment: Needs assistance Sitting-balance support: No upper extremity supported;Feet supported Sitting balance-Leahy Scale: Good     Standing balance support: No upper extremity supported Standing balance-Leahy Scale: Fair               High level balance activites: Backward walking;Direction changes;Turns;Other (comment) High Level Balance Comments: Practiced rhomberg, tandem stance            Cognition Arousal/Alertness: Awake/alert Behavior During Therapy: WFL for tasks assessed/performed Overall Cognitive Status: Within Functional Limits for tasks assessed                                 General Comments: Reports difficulty with memory - remembering times, datess, concept of time. Using calendars, phone and reminders to assist with keeping up with appointments. Reports she gets lost driving, in stores (recalls that she got so frustrated that she couldn't fiind the potatoes or problem solve how to locate potatoes she just left the store), forgets layout of home (if she sleeps "hard" the night before).      Exercises Other Exercises Other Exercises: Heel raises in standing with UE support x 8 Other Exercises: Toe  raises in standing with UE support x 5 Other Exercises: Standing facing bed with 1 knee on bed to simulate tall kneeling for 30 sec    General Comments        Pertinent Vitals/Pain      Home Living                      Prior Function            PT Goals (current goals can now be found in the care plan section) Progress towards PT  goals: Progressing toward goals    Frequency    Min 4X/week      PT Plan Current plan remains appropriate;Frequency needs to be updated    Co-evaluation              AM-PAC PT "6 Clicks" Mobility   Outcome Measure  Help needed turning from your back to your side while in a flat bed without using bedrails?: None Help needed moving from lying on your back to sitting on the side of a flat bed without using bedrails?: None Help needed moving to and from a bed to a chair (including a wheelchair)?: None Help needed standing up from a chair using your arms (e.g., wheelchair or bedside chair)?: None Help needed to walk in hospital room?: A Little Help needed climbing 3-5 steps with a railing? : A Little 6 Click Score: 22    End of Session Equipment Utilized During Treatment: Gait belt Activity Tolerance: Patient tolerated treatment well Patient left: with call bell/phone within reach;in bed;with family/visitor present   PT Visit Diagnosis: Unsteadiness on feet (R26.81);Difficulty in walking, not elsewhere classified (R26.2);Hemiplegia and hemiparesis Hemiplegia - Right/Left: Left Hemiplegia - dominant/non-dominant: Non-dominant Hemiplegia - caused by: Cerebral infarction     Time: 0233-4356 PT Time Calculation (min) (ACUTE ONLY): 21 min  Charges:  $Therapeutic Activity: 8-22 mins                     Frannie Pager [phone] Office [place] 02/09/2021, 3:09 PM

## 2021-02-09 NOTE — Progress Notes (Signed)
Patient transferred to [facility] via [facility] for further neuro/cardiac testing.  Patient left with belongings.  Report given to charge nurse on 3W.    Candie Mile, RN

## 2021-02-09 NOTE — Progress Notes (Signed)
PROGRESS NOTE    Sheila Guerrero  JEH:[id number] DOB: March 21, 1961 DOA: 02/07/2021 PCP: Donald Prose, MD    Brief Narrative:  60 year old female with history of hypertension, hyperlipidemia, type 2 diabetes on insulin, anxiety and depression, stroke in 2017 with no neurological deficits presented to the ER with 4 weeks of left-sided weakness and numbness.  She also has history of neuropathy.  Patient was having outpatient work-up, she was noted to have significant weakness on the left side so sent to ER.  MRI shows acute/subacute stroke.   Assessment & Plan:   Principal Problem:   Right thalamic infarction [facility]) Active Problems:   Type 2 diabetes mellitus with hyperlipidemia (HCC)   Paresthesia   Essential hypertension   Acute ischemic stroke (HCC)   Malnutrition of moderate degree  Acute/subacute ischemic stroke right MCA: Right-sided sensory deficit with mild ataxia. MRI of the brain, small acute right thalamus/posterior limb of internal capsule infarct.  Left occipital lobe infarct. MRA of the head, Severe right M1, moderate right V4 stenosis.  No large vessel occlusion. 2D echocardiogram, EF 40 to 45% with global hypokinesis.  Moderate concentric LVH.  No source of embolism. Carotid duplex is pending. LDL 55, on goal. A1c, pending. Intervention, dual antiplatelet therapy with aspirin and Plavix due to severe intracranial atherosclerotic disease.  High-dose statin with Lipitor 80 mg daily. Plan for TEE tomorrow, Dr. Terrence Dupont.  If negative, loop recorder. Seen by PT OT.  Recommended home health PT OT.  Continue mobilizing with nursing supervision.  Cardiomyopathy: Suspect hypertensive cardiomyopathy.  Lisinopril discontinued 3/13.  Currently on beta-blockers.  TEE tomorrow.  Cardiology planning to start on Entresto.  Hypokalemia: Replaced.  Sciatica: On Lyrica.  Discontinue IV pain medications.  Local therapies.  She plans to follow-up with her chiropractor.  Essential  hypertension: Blood pressure stable.  Type 2 diabetes with hyperlipidemia: A1c pending.  Holding Metformin.  Currently on insulin.  Anxiety/depression: Stable.   DVT prophylaxis: enoxaparin (LOVENOX) injection 40 mg Start: 02/07/21 1830   Code Status: Full code Family Communication: None Disposition Plan: Status is: Inpatient  Remains inpatient appropriate because:Inpatient level of care appropriate due to severity of illness   Dispo: The patient is from: Home              Anticipated d/c is to: Home              Patient currently is not medically stable to d/c.   Difficult to place patient No         Consultants:   Neurology  Cardiology  Procedures:   None  Antimicrobials:   None   Subjective: Patient seen and examined.  No overnight events.  Patient herself denied any complaints except she feels numb on the left side and feels like she is walking on water.  Managing to work around.  Objective: Vitals:   02/09/21 0041 02/09/21 0159 02/09/21 0426 02/09/21 0738  BP: (!) 173/104 136/88 (!) 156/62 (!) 136/97  Pulse: (!) 105 (!) 104  99  Resp: 20  18 18   Temp: 98.1 F (36.7 C) 97.7 F (36.5 C) 98 F (36.7 C) 98.3 F (36.8 C)  TempSrc:  Oral Oral Oral  SpO2: 98% 99% 99% 98%  Weight:      Height:       No intake or output data in the 24 hours ending 02/09/21 1254 Filed Weights   02/07/21 1047  Weight: 82.6 kg    Examination:  General exam: Appears calm and comfortable  Respiratory  system: Clear to auscultation. Respiratory effort normal. Cardiovascular system: S1 & S2 heard, RRR. No JVD, murmurs, rubs, gallops or clicks. No pedal edema. Gastrointestinal system: Abdomen is nondistended, soft and nontender. No organomegaly or masses felt. Normal bowel sounds heard. Central nervous system: Alert and oriented.  Motor examination is grossly normal.  Decreased superficial sensation on the left side. Extremities: Symmetric 5 x 5 power. Skin: No rashes,  lesions or ulcers Psychiatry: Judgement and insight appear normal. Mood & affect appropriate.     Data Reviewed: I have personally reviewed following labs and imaging studies  CBC: Recent Labs  Lab 02/07/21 1145  WBC 9.0  NEUTROABS 6.0  HGB 15.7*  HCT 45.8  MCV 86.1  PLT 295   Basic Metabolic Panel: Recent Labs  Lab 02/07/21 1145  NA 139  K 3.4*  CL 105  CO2 25  GLUCOSE 298*  BUN 18  CREATININE 0.53  CALCIUM 9.2   GFR: Estimated Creatinine Clearance: 81.3 mL/min (by C-G formula based on SCr of 0.53 mg/dL). Liver Function Tests: Recent Labs  Lab 02/07/21 1145  AST 16  ALT 21  ALKPHOS 71  BILITOT 0.8  PROT 6.8  ALBUMIN 3.8   No results for input(s): LIPASE, AMYLASE in the last 168 hours. No results for input(s): AMMONIA in the last 168 hours. Coagulation Profile: Recent Labs  Lab 02/07/21 1145  INR 1.0   Cardiac Enzymes: Recent Labs  Lab 02/08/21 0742  CKTOTAL 40   BNP (last 3 results) No results for input(s): PROBNP in the last 8760 hours. HbA1C: No results for input(s): HGBA1C in the last 72 hours. CBG: Recent Labs  Lab 02/08/21 0725 02/08/21 1148 02/08/21 1621 02/08/21 2112 02/09/21 1029  GLUCAP 269* 270* 239* 178* 390*   Lipid Profile: Recent Labs    02/08/21 0747  CHOL 130  HDL 33*  LDLCALC 55  TRIG 211*  CHOLHDL 3.9   Thyroid Function Tests: No results for input(s): TSH, T4TOTAL, FREET4, T3FREE, THYROIDAB in the last 72 hours. Anemia Panel: No results for input(s): VITAMINB12, FOLATE, FERRITIN, TIBC, IRON, RETICCTPCT in the last 72 hours. Sepsis Labs: No results for input(s): PROCALCITON, LATICACIDVEN in the last 168 hours.  Recent Results (from the past 240 hour(s))  Resp Panel by RT-PCR (Flu A&B, Covid) Nasopharyngeal Swab     Status: None   Collection Time: 02/07/21  3:00 PM   Specimen: Nasopharyngeal Swab; Nasopharyngeal(NP) swabs in vial transport medium  Result Value Ref Range Status   SARS Coronavirus 2 by RT PCR  NEGATIVE NEGATIVE Final    Comment: (NOTE) SARS-CoV-2 target nucleic acids are NOT DETECTED.  The SARS-CoV-2 RNA is generally detectable in upper respiratory specimens during the acute phase of infection. The lowest concentration of SARS-CoV-2 viral copies this assay can detect is 138 copies/mL. A negative result does not preclude SARS-Cov-2 infection and should not be used as the sole basis for treatment or other patient management decisions. A negative result may occur with  improper specimen collection/handling, submission of specimen other than nasopharyngeal swab, presence of viral mutation(s) within the areas targeted by this assay, and inadequate number of viral copies(<138 copies/mL). A negative result must be combined with clinical observations, patient history, and epidemiological information. The expected result is Negative.  Fact Sheet for Patients:  EntrepreneurPulse.com.au  Fact Sheet for Healthcare Providers:  IncredibleEmployment.be  This test is no t yet approved or cleared by the [place] FDA and  has been authorized for detection and/or diagnosis of SARS-CoV-2 by FDA  under an Emergency Use Authorization (EUA). This EUA will remain  in effect (meaning this test can be used) for the duration of the COVID-19 declaration under Section 564(b)(1) of the Act, 21 U.S.C.section 360bbb-3(b)(1), unless the authorization is terminated  or revoked sooner.       Influenza A by PCR NEGATIVE NEGATIVE Final   Influenza B by PCR NEGATIVE NEGATIVE Final    Comment: (NOTE) The Xpert Xpress SARS-CoV-2/FLU/RSV plus assay is intended as an aid in the diagnosis of influenza from Nasopharyngeal swab specimens and should not be used as a sole basis for treatment. Nasal washings and aspirates are unacceptable for Xpert Xpress SARS-CoV-2/FLU/RSV testing.  Fact Sheet for Patients: EntrepreneurPulse.com.au  Fact Sheet for  Healthcare Providers: IncredibleEmployment.be  This test is not yet approved or cleared by the [place] FDA and has been authorized for detection and/or diagnosis of SARS-CoV-2 by FDA under an Emergency Use Authorization (EUA). This EUA will remain in effect (meaning this test can be used) for the duration of the COVID-19 declaration under Section 564(b)(1) of the Act, 21 U.S.C. section 360bbb-3(b)(1), unless the authorization is terminated or revoked.  Performed at [facility], [employer] [address], [place], [place] [address]          Radiology Studies: MR ANGIO HEAD WO CONTRAST  Result Date: 02/07/2021 CLINICAL DATA:  Stroke. EXAM: MRA HEAD WITHOUT CONTRAST TECHNIQUE: Angiographic images of the Circle of Willis were obtained using MRA technique without intravenous contrast. COMPARISON:  None. FINDINGS: The visualized distal vertebral arteries are patent to the basilar with the left being dominant. There is moderate irregular narrowing of the distal right V4 segment. Patent PICAs and SCAs are seen bilaterally with evidence of a severe stenosis of the origin of the right SCA. The basilar artery is widely patent. Both PCAs are patent without evidence of a significant proximal stenosis. The internal carotid arteries are widely patent from skull base to carotid termini. ACAs and MCAs are patent with mild branch vessel irregularity but without evidence of a proximal branch occlusion or significant A1 or left M1 stenosis. There is a severe distal right M1 stenosis. An accessory left MCA and azygos A2 are noted, normal variants. No aneurysm is identified. IMPRESSION: 1. Intracranial atherosclerosis including severe right M1 and moderate right V4 stenoses. 2. No large vessel occlusion. Electronically Signed   By: Logan Bores M.D.   On: 02/07/2021 18:20   ECHOCARDIOGRAM COMPLETE  Result Date: 02/08/2021    ECHOCARDIOGRAM REPORT   Patient Name:   Sheila Guerrero Date of Exam: 02/08/2021 Medical Rec #:  [id number]          Height:       65.0 in Accession #:    [id number]         Weight:       182.0 lb Date of Birth:  12-22-1960          BSA:          1.900 m Patient Age:    28 years           BP:           131/70 mmHg Patient Gender: F                  HR:           106 bpm. Exam Location:  Inpatient Procedure: 2D Echo, 3D Echo, Cardiac Doppler, Color Doppler and Intracardiac  Opacification Agent Indications:    Stroke  History:        Patient has no prior history of Echocardiogram examinations.                 Stroke, Signs/Symptoms:Altered Mental Status; Risk                 Factors:Diabetes.  Sonographer:    Roseanna Rainbow RDCS Referring Phys: [id number] ANAND D HONGALGI  Sonographer Comments: Technically difficult study due to poor echo windows. Patient moving throughout test due to leg pain. IMPRESSIONS  1. Left ventricular ejection fraction, by estimation, is 40 to 45%. The left ventricle has mildly decreased function. The left ventricle demonstrates global hypokinesis. There is moderate concentric left ventricular hypertrophy. Indeterminate diastolic filling due to E-A fusion.  2. Right ventricular systolic function is normal. The right ventricular size is normal. Tricuspid regurgitation signal is inadequate for assessing PA pressure.  3. The mitral valve is grossly normal. Trivial mitral valve regurgitation. No evidence of mitral stenosis.  4. The aortic valve is tricuspid. Aortic valve regurgitation is not visualized. No aortic stenosis is present.  5. The inferior vena cava is normal in size with greater than 50% respiratory variability, suggesting right atrial pressure of 3 mmHg. Conclusion(s)/Recommendation(s): No intracardiac source of embolism detected on this transthoracic study. A transesophageal echocardiogram is recommended to exclude cardiac source of embolism if clinically indicated. No left ventricular mural or apical thrombus/thrombi. FINDINGS   Left Ventricle: Left ventricular ejection fraction, by estimation, is 40 to 45%. The left ventricle has mildly decreased function. The left ventricle demonstrates global hypokinesis. Definity contrast agent was given IV to delineate the left ventricular  endocardial borders. The left ventricular internal cavity size was normal in size. There is moderate concentric left ventricular hypertrophy. Indeterminate diastolic filling due to E-A fusion. Right Ventricle: The right ventricular size is normal. No increase in right ventricular wall thickness. Right ventricular systolic function is normal. Tricuspid regurgitation signal is inadequate for assessing PA pressure. Left Atrium: Left atrial size was normal in size. Right Atrium: Right atrial size was normal in size. Pericardium: Trivial pericardial effusion is present. Presence of pericardial fat pad. Mitral Valve: The mitral valve is grossly normal. There is mild thickening of the mitral valve leaflet(s). There is mild calcification of the anterior and posterior mitral valve leaflet(s). Mild mitral annular calcification. Trivial mitral valve regurgitation. No evidence of mitral valve stenosis. Tricuspid Valve: The tricuspid valve is grossly normal. Tricuspid valve regurgitation is trivial. No evidence of tricuspid stenosis. Aortic Valve: The aortic valve is tricuspid. Aortic valve regurgitation is not visualized. No aortic stenosis is present. Pulmonic Valve: The pulmonic valve was grossly normal. Pulmonic valve regurgitation is not visualized. No evidence of pulmonic stenosis. Aorta: The aortic root and ascending aorta are structurally normal, with no evidence of dilitation. Venous: The inferior vena cava is normal in size with greater than 50% respiratory variability, suggesting right atrial pressure of 3 mmHg. IAS/Shunts: The atrial septum is grossly normal.  LEFT VENTRICLE PLAX 2D LVIDd:         4.50 cm      Diastology LVIDs:         3.60 cm      LV e' medial:     7.62 cm/s LV PW:         1.35 cm      LV E/e' medial:  14.4 LV IVS:        1.42 cm  LV e' lateral:   5.11 cm/s                             LV E/e' lateral: 21.5  LV Volumes (MOD) LV vol d, MOD A2C: 118.0 ml LV vol d, MOD A4C: 117.0 ml LV vol s, MOD A2C: 66.7 ml LV vol s, MOD A4C: 69.4 ml LV SV MOD A2C:     51.3 ml LV SV MOD A4C:     117.0 ml LV SV MOD BP:      49.1 ml RIGHT VENTRICLE            IVC RV S prime:     9.57 cm/s  IVC diam: 1.50 cm TAPSE (M-mode): 1.8 cm LEFT ATRIUM             Index       RIGHT ATRIUM          Index LA diam:        2.90 cm 1.53 cm/m  RA Area:     7.36 cm LA Vol (A2C):   47.5 ml 24.99 ml/m RA Volume:   13.60 ml 7.16 ml/m LA Vol (A4C):   20.2 ml 10.63 ml/m LA Biplane Vol: 31.7 ml 16.68 ml/m  AORTIC VALVE LVOT Vmax:   93.80 cm/s LVOT Vmean:  68.900 cm/s LVOT VTI:    0.157 m  AORTA Ao Root diam: 2.80 cm Ao Asc diam:  3.50 cm MITRAL VALVE MV Area (PHT): 4.49 cm     SHUNTS MV Decel Time: 169 msec     Systemic VTI: 0.16 m MV E velocity: 110.00 cm/s MV A velocity: 61.60 cm/s MV E/A ratio:  1.79 Eleonore Chiquito MD Electronically signed by Eleonore Chiquito MD Signature Date/Time: 02/08/2021/10:55:01 AM    Final         Scheduled Meds: . aspirin EC  81 mg Oral Daily  . atorvastatin  80 mg Oral Daily  . carvedilol  3.125 mg Oral BID WC  . clopidogrel  75 mg Oral Daily  . enoxaparin (LOVENOX) injection  40 mg Subcutaneous Q24H  . insulin aspart  0-15 Units Subcutaneous TID WC  . insulin aspart  0-5 Units Subcutaneous QHS  . insulin aspart  4 Units Subcutaneous TID WC  . insulin glargine  10 Units Subcutaneous Daily  . multivitamin with minerals  1 tablet Oral Daily  . omega-3 acid ethyl esters  1 g Oral Daily  . pregabalin  150 mg Oral BID  . traZODone  100 mg Oral QHS   Continuous Infusions:   LOS: 1 day    Time spent: 28 minutes    Barb Merino, MD [facility] Pager [phone]

## 2021-02-09 NOTE — Anesthesia Preprocedure Evaluation (Addendum)
Anesthesia Evaluation  Patient identified by MRN, date of birth, ID band Patient awake    Reviewed: Allergy & Precautions, NPO status , Patient's Chart, lab work & pertinent test results, Unable to perform ROS - Chart review only  Airway Mallampati: II  TM Distance: >3 FB Neck ROM: Full    Dental  (+) Teeth Intact, Dental Advisory Given   Pulmonary asthma ,    Pulmonary exam normal breath sounds clear to auscultation       Cardiovascular hypertension, Pt. on medications  Rhythm:Regular Rate:Tachycardia  Echo 01/2021 1. Left ventricular ejection fraction, by estimation, is 40 to 45%. The left ventricle has mildly decreased function. The left ventricle demonstrates global hypokinesis. There is moderate concentric left ventricular hypertrophy. Indeterminate diastolic filling due to E-A fusion.  2. Right ventricular systolic function is normal. The right ventricular size is normal. Tricuspid regurgitation signal is inadequate for assessing PA pressure.  3. The mitral valve is grossly normal. Trivial mitral valve  regurgitation. No evidence of mitral stenosis.  4. The aortic valve is tricuspid. Aortic valve regurgitation is not visualized. No aortic stenosis is present.  5. The inferior vena cava is normal in size with greater than 50% respiratory variability, suggesting right atrial pressure of 3 mmHg.    Neuro/Psych PSYCHIATRIC DISORDERS Anxiety Depression CVA, Residual Symptoms    GI/Hepatic negative GI ROS, Neg liver ROS,   Endo/Other  diabetes, Type 2, Oral Hypoglycemic Agents  Renal/GU negative Renal ROS     Musculoskeletal negative musculoskeletal ROS (+)   Abdominal   Peds  Hematology negative hematology ROS (+)   Anesthesia Other Findings   Reproductive/Obstetrics                           Anesthesia Physical Anesthesia Plan  ASA: III  Anesthesia Plan: MAC   Post-op Pain  Management:    Induction: Intravenous  PONV Risk Score and Plan: 2 and Propofol infusion, Treatment may vary due to age or medical condition and TIVA  Airway Management Planned:   Additional Equipment:   Intra-op Plan:   Post-operative Plan:   Informed Consent: I have reviewed the patients History and Physical, chart, labs and discussed the procedure including the risks, benefits and alternatives for the proposed anesthesia with the patient or authorized representative who has indicated his/her understanding and acceptance.     Dental advisory given  Plan Discussed with: CRNA  Anesthesia Plan Comments:        Anesthesia Quick Evaluation

## 2021-02-09 NOTE — Progress Notes (Signed)
Initial Nutrition Assessment  DOCUMENTATION CODES:  Non-severe (moderate) malnutrition in context of chronic illness  INTERVENTION:   Add snacks TID.  Add double portions of protein to all meal trays.  Add MVI with minerals daily.  NUTRITION DIAGNOSIS:  Moderate Malnutrition related to chronic illness (chronic HTN; suspected uncontrolled T2DM) as evidenced by mild muscle depletion,mild fat depletion.  GOAL:  Patient will meet greater than or equal to 90% of their needs  MONITOR:  PO intake,Labs  REASON FOR ASSESSMENT:  Malnutrition Screening Tool   ASSESSMENT:  60 yo female with a PMH of angiodema, chronic HTN, HLD, T2DM, anxiety/depression, CVA in 2017 with some memory issues who presents with acute stroke. Neurology consulted and stroke work-up ongoing. Cardiology consulted for new onset cardiomyopathy and for possible TEE.  Spoke with pt and husband at bedside, both in good spirits. Husband reports a 40 lb weight loss in 2 years, and wife endorses. He reports that she had trouble losing weight for years, and now it has fallen off without her trying to lose weight. Per Epic, she has lost 23 lbs, or 11.3% BW, since 04/2018.  She eats very well at home. Husband recalled Breakfast: eggs, toast, bacon, tea/water, very rarely juice, Lunch: leftovers from night before (chicken, casserole, meats, salads), and Dinner: salads, fruits, meats, vegetables, casseroles. Sometimes they snack on cookies or fruits throughout the day also. He reports her appetite is unchanged and they both eat well. She reports trying to eat 30 grams of protein for lunch and dinner while at home.  Steady weight loss likely d/t uncontrolled T2DM and chronic HTN, waiting for HbA1c to confirm, and likely slight levels of inflammation due to these as well, causing the weight loss.  Pt reports being hesitant to drink Ensure or other supplements d/t her father's allergies to preservatives, so she declined. She is  agreeable to snacks between meals.  Recommend snacks TID and double protein to all meal trays to promote fat and muscle gain.  Relevant Medications: SSI, novolog, lantus, omega-3 acid ethyl esters 1 g, morphine Labs: reviewed; K 3.4, Glucose 298 HbA1c: 9.3% (06/2017) - pending new results  NUTRITION - FOCUSED PHYSICAL EXAM: Flowsheet Row Most Recent Value  Orbital Region Mild depletion  Upper Arm Region Moderate depletion  Thoracic and Lumbar Region No depletion  Buccal Region Mild depletion  Temple Region Mild depletion  Clavicle Bone Region No depletion  Clavicle and Acromion Bone Region Moderate depletion  Scapular Bone Region No depletion  Dorsal Hand Mild depletion  Patellar Region Moderate depletion  Anterior Thigh Region Severe depletion  Posterior Calf Region No depletion  Edema (RD Assessment) None  Hair Reviewed  Eyes Reviewed  Mouth Reviewed  Skin Reviewed  [dry]  Nails Reviewed     Diet Order:   Diet Order            Diet heart healthy/carb modified Room service appropriate? Yes; Fluid consistency: Thin  Diet effective now                EDUCATION NEEDS:  Education needs have been addressed  Skin:  Skin Assessment: Reviewed RN Assessment  Last BM:  02/07/21  Height:  Ht Readings from Last 1 Encounters:  02/07/21 5\' 5"  (1.651 m)   Weight:  Wt Readings from Last 1 Encounters:  02/07/21 82.6 kg   Ideal Body Weight:  56.8 kg  BMI:  Body mass index is 30.29 kg/m.  Estimated Nutritional Needs:  Kcal:  1700-1900 Protein:  85-100 grams Fluid:  >  Brent, RD, LDN Registered Dietitian After Hours/Weekend Pager # in [place]

## 2021-02-09 NOTE — Telephone Encounter (Signed)
FYI  Patient left me a voicemail on my phone stating she is admitted in the hospital and had a MRI Brain done. She canceled her appt that was scheduled at [facility] for 02/10/21.

## 2021-02-09 NOTE — Progress Notes (Addendum)
STROKE TEAM PROGRESS NOTE   INTERVAL HISTORY No acute events overnight.   Her husband is at the bedside.  She reports that she is still having significant pain in her Right side. Her husband reports that the weakness, numbness, and poor coordination started about 4 weeks ago. She reports that she was supposed to be on aspirin but that she did not take it regularly. Discussed starting dual antiplatelet therapy for 3 weeks and then taking only aspirin.  Hospitalist Service is primary. Cardiology is following. Will get TEE and Loop recorder tomorrow. A1C is ordered.  Vitals:   02/09/21 0041 02/09/21 0159 02/09/21 0426 02/09/21 0738  BP: (!) 173/104 136/88 (!) 156/62 (!) 136/97  Pulse: (!) 105 (!) 104  99  Resp: 20  18 18   Temp: 98.1 F (36.7 C) 97.7 F (36.5 C) 98 F (36.7 C) 98.3 F (36.8 C)  TempSrc:  Oral Oral Oral  SpO2: 98% 99% 99% 98%  Weight:      Height:       CBC:  Recent Labs  Lab 02/07/21 1145  WBC 9.0  NEUTROABS 6.0  HGB 15.7*  HCT 45.8  MCV 86.1  PLT 671   Basic Metabolic Panel:  Recent Labs  Lab 02/07/21 1145  NA 139  K 3.4*  CL 105  CO2 25  GLUCOSE 298*  BUN 18  CREATININE 0.53  CALCIUM 9.2   Lipid Panel:  Recent Labs  Lab 02/08/21 0747  CHOL 130  TRIG 211*  HDL 33*  CHOLHDL 3.9  VLDL 42*  LDLCALC 55   HgbA1c: No results for input(s): HGBA1C in the last 168 hours. Urine Drug Screen:  Recent Labs  Lab 02/07/21 1357  LABOPIA POSITIVE*  COCAINSCRNUR NONE DETECTED  LABBENZ NONE DETECTED  AMPHETMU NONE DETECTED  THCU NONE DETECTED  LABBARB NONE DETECTED    Alcohol Level No results for input(s): ETH in the last 168 hours.  IMAGING past 24 hours No results found.  PHYSICAL EXAM Blood pressure (!) 136/97, pulse 99, temperature 98.3 F (36.8 C), temperature source Oral, resp. rate 18, height 5\' 5"  (1.651 m), weight 82.6 kg, last menstrual period 11/25/2011, SpO2 98 %.  General: alert and awake, middle aged caucasian female, no  apparent distress  Lungs: Symmetrical Chest rise, no labored breathing  Cardio: Regular Rate and Rhythm  Abdomen: Soft, non-tender  Neuro: Alert, oriented, thought content appropriate.  Speech fluent without evidence of aphasia.  Able to follow all commands without difficulty. Cranial Nerves: II:  Visual fields grossly normal, pupils equal, round, reactive to light and accommodation III,IV, VI: ptosis not present, extra-ocular motions intact bilaterally V,VII: smile symmetric, facial light touch diminished on the left VIII: hearing normal bilaterally IX,X: uvula rises symmetrically XI: bilateral shoulder shrug XII: midline tongue extension without atrophy or fasciculations  Motor: Right : Upper extremity   5/5    Left:     Upper extremity   5/5  Lower extremity   5/5     Lower extremity   5/5 Tone and bulk:normal tone throughout; no atrophy noted Sensory: light touch diminished on the left hemibody including face and forehead and does not split the midline.. Vibration diminished on the left side of body but sparing the forehead Cerebellar: normal finger-to-nose,  normal heel-to-shin test Gait: deffered    ASSESSMENT/PLAN Ms. Samarra Ridgely is a 60 y.o. female with history of DM2, HTN, stroke in 2017, panic attacks, situational stress and dyslipidemia who presented to the ED on Saturday morning with a  chief complaint of left sided numbness and tingling x 4 weeks in conjunction with progressive left sided weakness. The patient's symptoms began on February 14 with sudden onset of left face, arm and leg numbness in addition to tingling and weakness with some gait abnormality.She was seen by her PCP for this and was prescribed Lyrica for possible neuropathy. The Lyrica did not improve her symptoms, so she went to her Neurologist, Dr. Krista Blue, who ordered an MRI which was to be done this Tuesday. Dr. Krista Blue also switched the patient from her daily ASA, which she had been taking since her stroke  in 2017, to Plavix. Per Dr. Rhea Belton note, the patient's prior MRI of the brain in 2018 was consistent with chronic small vessel disease with evidence of right dominant chronic cerebral microhemorrhage and chronic lacunar infarct. EEG in 2018 was normal. The patient also complains of right sided "sciatica pain" due to favoring of her right side in the context of the above weakness. The pain extends along the anterolateral aspect of her right thigh to the lateral aspect of her knee. MRI brain at [facility] revealed a right thalamic subacute ischemic infarction as well as a small subacute left occipital lobe ischemic infarction. Numerous chronic microhemorrhages appearing most consistent with chronic hypertension were seen. Moderately advanced chronic small vessel ischemic changes were also noted.  Given the previous imaging showing a stroke approximately 76 week old per imaging criteria but symptoms starting 4 weeks prior and physical exam is also inconsistent with Imagining findings will order MR Brain W contrast to look for any abnormal enhancement or multiple enhancing lesions.. She reports decrease of sensation on the left side of body and face. Will start Topamax to control pain symptoms. She will get a TEE and be set up for a Loop recorder tomorrow.   Right Thalamic Infarct and Subacute Left Occipital Lobe Infarct secondary to small vessel disease due to uncontrolled hypertension likely.  MRI WO Contrast: Small acute to early subacute infarct in the right thalamus/posterior limb of internal capsule. Small subacute left occipital lobe infarct. Moderately advanced chronic small vessel ischemic disease, progressed from 2018. Numerous chronic microhemorrhages consistent with chronic hypertension.  MRI W Contrast- Odered  MRA - Intracranial atherosclerosis including severe right M1 and moderate right V4 stenoses. No large vessel occlusion.  Carotid Doppler- ordered  2D Echo - EF: 40-45% Left ventricular global  hypokinesis. Moderate concentric Left ventricular hypertrophy.  LDL 55  HgbA1c No results found for requested labs within last 26280 hours.  VTE prophylaxis - Lovenox    Diet   Diet heart healthy/carb modified Room service appropriate? Yes; Fluid consistency: Thin     Aspirin 81 mg but not taking regularly prior to admission, now on aspirin 81 mg daily and clopidogrel 75 mg daily. Will stay on dual therapy for 3 weeks then stay on Aspirin 81 mg only.  Therapy recommendations:  Outpatient PT  Disposition:  Pending   Hypertension  Home meds:  Lisinopril (stopped)  Stable . Transitioning to [facility] . Starting Carvedilol . Long-term BP goal normotensive  Hyperlipidemia  Home meds:  Lipitor 20 mg (changed)  LDL 55, goal < 70  Increase Lipitor to 80 mg daily  High intensity statin Lipitor 80 mg  Continue statin at discharge  Diabetes type II Unknown Status  Home meds:  Metformin  HgbA1c No results found for requested labs within last 26280 hours., goal < 7.0  CBGs Recent Labs    02/08/21 1621 02/08/21 2112 02/09/21 1029  GLUCAP  239* 178* 390*      SSI  Other Stroke Risk Factors  Obesity, Body mass index is 30.29 kg/m., BMI >/= 30 associated with increased stroke risk, recommend weight loss, diet and exercise as appropriate   Hx stroke  Family hx stroke (Father- stroke)    Hospital day # 1  Fatima Sanger MD Resident  I have personally obtained history,examined this patient, reviewed notes, independently viewed imaging studies, participated in medical decision making and plan of care.ROS completed by me personally and pertinent positives fully documented  I have made any additions or clarifications directly to the above note. Agree with note above.  Patient presented with subjective left hemibody paresthesias for nearly 4 weeks MRI does show a right thalamic lesion which could explain this but on imaging criteria it appears to be only 58 week old also  his story does not perfectly fit.  Is a silent left occipital infarct as well.  Given prior strokes MRI imaging appearance this is likely synchronous infarcts from small vessel disease however given multiple infarcts for cardioembolic source and prolonged cardiac monitoring is not unreasonable.  Recommend add Topamax 50 mg twice daily to Lyrica for her persistent paresthesias as well as radicular pain.  Recommend aspirin 81 mg and Plavix 75 mg daily for 3 weeks followed by aspirin alone and aggressive risk factor modification.  Long discussion with patient and her husband and answered questions.  Greater than 50% time during the 35-minute visit was spent out her paresthesias and strokes and answering questions.  Antony Contras, MD Medical Director [place] Pager: [phone] 02/09/2021 3:46 PM   To contact Stroke Continuity provider, please refer to [email or web address] After hours, contact General Neurology

## 2021-02-09 NOTE — Progress Notes (Signed)
Subjective:  Denies any chest pain or shortness of breath.  Ambulating in hallway with the help of walker.  Scheduled for TEE tomorrow morning.  Patient advised to be n.p.o. after midnight  Objective:  Vital Signs in the last 24 hours: Temp:  [97.7 F (36.5 C)-98.4 F (36.9 C)] 98.3 F (36.8 C) (03/14 0738) Pulse Rate:  [99-108] 99 (03/14 0738) Resp:  [16-20] 18 (03/14 0738) BP: (124-173)/(62-105) 136/97 (03/14 0738) SpO2:  [95 %-99 %] 98 % (03/14 0738)  Intake/Output from previous day: 03/13 0701 - 03/14 0700 In: 240 [P.O.:240] Out: -  Intake/Output from this shift: No intake/output data recorded.  Physical Exam: Exam unchanged  Lab Results: Recent Labs    02/07/21 1145  WBC 9.0  HGB 15.7*  PLT 301   Recent Labs    02/07/21 1145  NA 139  K 3.4*  CL 105  CO2 25  GLUCOSE 298*  BUN 18  CREATININE 0.53   No results for input(s): TROPONINI in the last 72 hours.  Invalid input(s): CK, MB Hepatic Function Panel Recent Labs    02/07/21 1145  PROT 6.8  ALBUMIN 3.8  AST 16  ALT 21  ALKPHOS 71  BILITOT 0.8   Recent Labs    02/08/21 0747  CHOL 130   No results for input(s): PROTIME in the last 72 hours.  Imaging: Imaging results have been reviewed and MR ANGIO HEAD WO CONTRAST  Result Date: 02/07/2021 CLINICAL DATA:  Stroke. EXAM: MRA HEAD WITHOUT CONTRAST TECHNIQUE: Angiographic images of the Circle of Willis were obtained using MRA technique without intravenous contrast. COMPARISON:  None. FINDINGS: The visualized distal vertebral arteries are patent to the basilar with the left being dominant. There is moderate irregular narrowing of the distal right V4 segment. Patent PICAs and SCAs are seen bilaterally with evidence of a severe stenosis of the origin of the right SCA. The basilar artery is widely patent. Both PCAs are patent without evidence of a significant proximal stenosis. The internal carotid arteries are widely patent from skull base to carotid  termini. ACAs and MCAs are patent with mild branch vessel irregularity but without evidence of a proximal branch occlusion or significant A1 or left M1 stenosis. There is a severe distal right M1 stenosis. An accessory left MCA and azygos A2 are noted, normal variants. No aneurysm is identified. IMPRESSION: 1. Intracranial atherosclerosis including severe right M1 and moderate right V4 stenoses. 2. No large vessel occlusion. Electronically Signed   By: Logan Bores M.D.   On: 02/07/2021 18:20   ECHOCARDIOGRAM COMPLETE  Result Date: 02/08/2021    ECHOCARDIOGRAM REPORT   Patient Name:   Sheila Guerrero Date of Exam: 02/08/2021 Medical Rec #:  [id number]          Height:       65.0 in Accession #:    [id number]         Weight:       182.0 lb Date of Birth:  [birth year]          BSA:          1.900 m Patient Age:    60 years           BP:           131/70 mmHg Patient Gender: F                  HR:           106 bpm. Exam Location:  Inpatient Procedure:  2D Echo, 3D Echo, Cardiac Doppler, Color Doppler and Intracardiac            Opacification Agent Indications:    Stroke  History:        Patient has no prior history of Echocardiogram examinations.                 Stroke, Signs/Symptoms:Altered Mental Status; Risk                 Factors:Diabetes.  Sonographer:    Roseanna Rainbow RDCS Referring Phys: [id number] ANAND D HONGALGI  Sonographer Comments: Technically difficult study due to poor echo windows. Patient moving throughout test due to leg pain. IMPRESSIONS  1. Left ventricular ejection fraction, by estimation, is 40 to 45%. The left ventricle has mildly decreased function. The left ventricle demonstrates global hypokinesis. There is moderate concentric left ventricular hypertrophy. Indeterminate diastolic filling due to E-A fusion.  2. Right ventricular systolic function is normal. The right ventricular size is normal. Tricuspid regurgitation signal is inadequate for assessing PA pressure.  3. The mitral valve is grossly  normal. Trivial mitral valve regurgitation. No evidence of mitral stenosis.  4. The aortic valve is tricuspid. Aortic valve regurgitation is not visualized. No aortic stenosis is present.  5. The inferior vena cava is normal in size with greater than 50% respiratory variability, suggesting right atrial pressure of 3 mmHg. Conclusion(s)/Recommendation(s): No intracardiac source of embolism detected on this transthoracic study. A transesophageal echocardiogram is recommended to exclude cardiac source of embolism if clinically indicated. No left ventricular mural or apical thrombus/thrombi. FINDINGS  Left Ventricle: Left ventricular ejection fraction, by estimation, is 40 to 45%. The left ventricle has mildly decreased function. The left ventricle demonstrates global hypokinesis. Definity contrast agent was given IV to delineate the left ventricular  endocardial borders. The left ventricular internal cavity size was normal in size. There is moderate concentric left ventricular hypertrophy. Indeterminate diastolic filling due to E-A fusion. Right Ventricle: The right ventricular size is normal. No increase in right ventricular wall thickness. Right ventricular systolic function is normal. Tricuspid regurgitation signal is inadequate for assessing PA pressure. Left Atrium: Left atrial size was normal in size. Right Atrium: Right atrial size was normal in size. Pericardium: Trivial pericardial effusion is present. Presence of pericardial fat pad. Mitral Valve: The mitral valve is grossly normal. There is mild thickening of the mitral valve leaflet(s). There is mild calcification of the anterior and posterior mitral valve leaflet(s). Mild mitral annular calcification. Trivial mitral valve regurgitation. No evidence of mitral valve stenosis. Tricuspid Valve: The tricuspid valve is grossly normal. Tricuspid valve regurgitation is trivial. No evidence of tricuspid stenosis. Aortic Valve: The aortic valve is tricuspid. Aortic  valve regurgitation is not visualized. No aortic stenosis is present. Pulmonic Valve: The pulmonic valve was grossly normal. Pulmonic valve regurgitation is not visualized. No evidence of pulmonic stenosis. Aorta: The aortic root and ascending aorta are structurally normal, with no evidence of dilitation. Venous: The inferior vena cava is normal in size with greater than 50% respiratory variability, suggesting right atrial pressure of 3 mmHg. IAS/Shunts: The atrial septum is grossly normal.  LEFT VENTRICLE PLAX 2D LVIDd:         4.50 cm      Diastology LVIDs:         3.60 cm      LV e' medial:    7.62 cm/s LV PW:         1.35 cm  LV E/e' medial:  14.4 LV IVS:        1.42 cm      LV e' lateral:   5.11 cm/s                             LV E/e' lateral: 21.5  LV Volumes (MOD) LV vol d, MOD A2C: 118.0 ml LV vol d, MOD A4C: 117.0 ml LV vol s, MOD A2C: 66.7 ml LV vol s, MOD A4C: 69.4 ml LV SV MOD A2C:     51.3 ml LV SV MOD A4C:     117.0 ml LV SV MOD BP:      49.1 ml RIGHT VENTRICLE            IVC RV S prime:     9.57 cm/s  IVC diam: 1.50 cm TAPSE (M-mode): 1.8 cm LEFT ATRIUM             Index       RIGHT ATRIUM          Index LA diam:        2.90 cm 1.53 cm/m  RA Area:     7.36 cm LA Vol (A2C):   47.5 ml 24.99 ml/m RA Volume:   13.60 ml 7.16 ml/m LA Vol (A4C):   20.2 ml 10.63 ml/m LA Biplane Vol: 31.7 ml 16.68 ml/m  AORTIC VALVE LVOT Vmax:   93.80 cm/s LVOT Vmean:  68.900 cm/s LVOT VTI:    0.157 m  AORTA Ao Root diam: 2.80 cm Ao Asc diam:  3.50 cm MITRAL VALVE MV Area (PHT): 4.49 cm     SHUNTS MV Decel Time: 169 msec     Systemic VTI: 0.16 m MV E velocity: 110.00 cm/s MV A velocity: 61.60 cm/s MV E/A ratio:  1.79 Eleonore Chiquito MD Electronically signed by Eleonore Chiquito MD Signature Date/Time: 02/08/2021/10:55:01 AM    Final     Cardiac Studies:  Assessment/Plan:  Status post small acute right thalamic/posterior limb of internal capsule and small subacute left occipital lobe infarct rule out cardiac  emboli History of CVA in 2017 Hypertension Hypertensive heart disease with systolic dysfunction Type 2 diabetes mellitus Hyperlipidemia Anxiety Depression Plan Start Entresto 24/26 mg 1 tablet twice daily Will uptitrate beta-blockers and Entresto as blood pressure tolerates Consider adding Jardiance Scheduled for TEE in a.m. N.p.o. after midnight  LOS: 1 day    Charolette Forward 02/09/2021, 12:58 PM

## 2021-02-10 ENCOUNTER — Inpatient Hospital Stay (HOSPITAL_COMMUNITY): Payer: 59 | Admitting: Anesthesiology

## 2021-02-10 ENCOUNTER — Inpatient Hospital Stay (HOSPITAL_COMMUNITY): Payer: 59

## 2021-02-10 ENCOUNTER — Encounter (HOSPITAL_COMMUNITY): Payer: Self-pay | Admitting: Internal Medicine

## 2021-02-10 ENCOUNTER — Other Ambulatory Visit: Payer: 59

## 2021-02-10 ENCOUNTER — Encounter (HOSPITAL_COMMUNITY)
Admission: EM | Disposition: A | Discharge status: Home Health | Payer: Self-pay | Source: Home / Self Care | Attending: Internal Medicine

## 2021-02-10 DIAGNOSIS — I76 Septic arterial embolism: Secondary | ICD-10-CM

## 2021-02-10 DIAGNOSIS — I330 Acute and subacute infective endocarditis: Secondary | ICD-10-CM

## 2021-02-10 DIAGNOSIS — R202 Paresthesia of skin: Secondary | ICD-10-CM | POA: Diagnosis not present

## 2021-02-10 DIAGNOSIS — I339 Acute and subacute endocarditis, unspecified: Secondary | ICD-10-CM

## 2021-02-10 DIAGNOSIS — I10 Essential (primary) hypertension: Secondary | ICD-10-CM

## 2021-02-10 DIAGNOSIS — I059 Rheumatic mitral valve disease, unspecified: Secondary | ICD-10-CM

## 2021-02-10 DIAGNOSIS — I639 Cerebral infarction, unspecified: Secondary | ICD-10-CM | POA: Diagnosis not present

## 2021-02-10 DIAGNOSIS — E440 Moderate protein-calorie malnutrition: Secondary | ICD-10-CM

## 2021-02-10 HISTORY — DX: Acute and subacute endocarditis, unspecified: I33.9

## 2021-02-10 HISTORY — PX: TEE WITHOUT CARDIOVERSION: SHX5443

## 2021-02-10 HISTORY — DX: Septic arterial embolism: I76

## 2021-02-10 HISTORY — PX: BUBBLE STUDY: SHX6837

## 2021-02-10 LAB — CBC WITH DIFFERENTIAL/PLATELET
Abs Immature Granulocytes: 0.03 10*3/uL (ref 0.00–0.07)
Basophils Absolute: 0.1 10*3/uL (ref 0.0–0.1)
Basophils Relative: 1 %
Eosinophils Absolute: 0.1 10*3/uL (ref 0.0–0.5)
Eosinophils Relative: 1 %
HCT: 47.2 % — ABNORMAL HIGH (ref 36.0–46.0)
Hemoglobin: 15.6 g/dL — ABNORMAL HIGH (ref 12.0–15.0)
Immature Granulocytes: 0 %
Lymphocytes Relative: 24 %
Lymphs Abs: 2.4 10*3/uL (ref 0.7–4.0)
MCH: 28.8 pg (ref 26.0–34.0)
MCHC: 33.1 g/dL (ref 30.0–36.0)
MCV: 87.1 fL (ref 80.0–100.0)
Monocytes Absolute: 0.7 10*3/uL (ref 0.1–1.0)
Monocytes Relative: 7 %
Neutro Abs: 6.4 10*3/uL (ref 1.7–7.7)
Neutrophils Relative %: 67 %
Platelets: 262 10*3/uL (ref 150–400)
RBC: 5.42 MIL/uL — ABNORMAL HIGH (ref 3.87–5.11)
RDW: 12.6 % (ref 11.5–15.5)
WBC: 9.8 10*3/uL (ref 4.0–10.5)
nRBC: 0 % (ref 0.0–0.2)

## 2021-02-10 LAB — GLUCOSE, CAPILLARY
Glucose-Capillary: 215 mg/dL — ABNORMAL HIGH (ref 70–99)
Glucose-Capillary: 383 mg/dL — ABNORMAL HIGH (ref 70–99)
Glucose-Capillary: 286 mg/dL — ABNORMAL HIGH (ref 70–99)
Glucose-Capillary: 308 mg/dL — ABNORMAL HIGH (ref 70–99)

## 2021-02-10 LAB — ECHO TEE
AV Mean grad: 3 mmHg
AV Peak grad: 4.8 mmHg
Ao pk vel: 1.1 m/s

## 2021-02-10 SURGERY — ECHOCARDIOGRAM, TRANSESOPHAGEAL
Anesthesia: Monitor Anesthesia Care

## 2021-02-10 MED ORDER — SODIUM CHLORIDE 0.9 % IV SOLN
800.0000 mg | Freq: Every day | INTRAVENOUS | Status: DC
  Administered 2021-02-10 – 2021-02-12 (×3): 800 mg via INTRAVENOUS
  Filled 2021-02-10 (×3): qty 16

## 2021-02-10 MED ORDER — PROPOFOL 10 MG/ML IV BOLUS
INTRAVENOUS | Status: DC | PRN
  Administered 2021-02-10: 20 mg via INTRAVENOUS
  Administered 2021-02-10 (×2): 30 mg via INTRAVENOUS

## 2021-02-10 MED ORDER — LIDOCAINE 2% (20 MG/ML) 5 ML SYRINGE
INTRAMUSCULAR | Status: DC | PRN
  Administered 2021-02-10: 60 mg via INTRAVENOUS

## 2021-02-10 MED ORDER — SODIUM CHLORIDE 0.9 % IV SOLN: INTRAVENOUS | Status: DC

## 2021-02-10 MED ORDER — PROPOFOL 500 MG/50ML IV EMUL
INTRAVENOUS | Status: DC | PRN
  Administered 2021-02-10: 75 ug/kg/min via INTRAVENOUS

## 2021-02-10 MED ORDER — SODIUM CHLORIDE 0.9 % IV SOLN
INTRAVENOUS | Status: AC | PRN
  Administered 2021-02-10: 500 mL via INTRAVENOUS

## 2021-02-10 MED ORDER — SODIUM CHLORIDE 0.9 % IV SOLN
2.0000 g | Freq: Two times a day (BID) | INTRAVENOUS | Status: DC
  Administered 2021-02-10 – 2021-02-12 (×4): 2 g via INTRAVENOUS
  Filled 2021-02-10: qty 2
  Filled 2021-02-10 (×2): qty 20
  Filled 2021-02-10 (×2): qty 2

## 2021-02-10 MED ORDER — MORPHINE SULFATE (PF) 2 MG/ML IV SOLN
2.0000 mg | INTRAVENOUS | Status: DC | PRN
  Administered 2021-02-10 – 2021-02-11 (×2): 2 mg via INTRAVENOUS
  Filled 2021-02-10 (×2): qty 1

## 2021-02-10 NOTE — Progress Notes (Signed)
  Echocardiogram Echocardiogram Transesophageal has been performed.  Sheila Guerrero 02/10/2021, 8:57 AM

## 2021-02-10 NOTE — Plan of Care (Signed)
  Problem: Education: Goal: Knowledge of disease or condition will improve Outcome: Progressing Goal: Knowledge of secondary prevention will improve Outcome: Progressing Goal: Knowledge of patient specific risk factors addressed and post discharge goals established will improve Outcome: Progressing Goal: Individualized Educational Video(s) Outcome: Progressing   Problem: Coping: Goal: Will verbalize positive feelings about self Outcome: Progressing Goal: Will identify appropriate support needs Outcome: Progressing   Problem: Health Behavior/Discharge Planning: Goal: Ability to manage health-related needs will improve Outcome: Progressing   Problem: Self-Care: Goal: Ability to participate in self-care as condition permits will improve Outcome: Progressing Goal: Verbalization of feelings and concerns over difficulty with self-care will improve Outcome: Progressing Goal: Ability to communicate needs accurately will improve Outcome: Progressing   Problem: Nutrition: Goal: Risk of aspiration will decrease Outcome: Progressing Goal: Dietary intake will improve Outcome: Progressing   Problem: Intracerebral Hemorrhage Tissue Perfusion: Goal: Complications of Intracerebral Hemorrhage will be minimized Outcome: Progressing   Problem: Ischemic Stroke/TIA Tissue Perfusion: Goal: Complications of ischemic stroke/TIA will be minimized Outcome: Progressing   Problem: Spontaneous Subarachnoid Hemorrhage Tissue Perfusion: Goal: Complications of Spontaneous Subarachnoid Hemorrhage will be minimized Outcome: Progressing   Problem: Education: Goal: Knowledge of General Education information will improve Description: Including pain rating scale, medication(s)/side effects and non-pharmacologic comfort measures Outcome: Progressing   Problem: Health Behavior/Discharge Planning: Goal: Ability to manage health-related needs will improve Outcome: Progressing   Problem: Clinical  Measurements: Goal: Ability to maintain clinical measurements within normal limits will improve Outcome: Progressing Goal: Will remain free from infection Outcome: Progressing Goal: Diagnostic test results will improve Outcome: Progressing Goal: Respiratory complications will improve Outcome: Progressing Goal: Cardiovascular complication will be avoided Outcome: Progressing   Problem: Activity: Goal: Risk for activity intolerance will decrease Outcome: Progressing   Problem: Nutrition: Goal: Adequate nutrition will be maintained Outcome: Progressing   Problem: Coping: Goal: Level of anxiety will decrease Outcome: Progressing   Problem: Elimination: Goal: Will not experience complications related to bowel motility Outcome: Progressing Goal: Will not experience complications related to urinary retention Outcome: Progressing   Problem: Pain Managment: Goal: General experience of comfort will improve Outcome: Progressing   Problem: Safety: Goal: Ability to remain free from injury will improve Outcome: Progressing   Problem: Skin Integrity: Goal: Risk for impaired skin integrity will decrease Outcome: Progressing

## 2021-02-10 NOTE — Progress Notes (Signed)
STROKE TEAM PROGRESS NOTE   INTERVAL HISTORY No acute events overnight.   Her husband is at the bedside.  MRI scan of the brain with contrast shows subtle adenopathy in the right thalamic and left occipital infarcts reflecting subacute nature.  No other enhancing lesions are noted.  TEE shows small vegetations on both mitral valve leaflets.  I patient denies any history of fever but does state that she has had increased fatigue and tiredness since October.  Blood cultures have been sent and ID has been consulted.  Neurological exam is unchanged.  Vital signs are stable.  Vitals:   02/10/21 0730 02/10/21 0831 02/10/21 0839 02/10/21 0848  BP: (!) 154/106 111/69 116/72 138/89  Pulse: (!) 103 90 92 95  Resp: (!) 23 (!) 32 (!) 21 (!) 27  Temp: 97.7 F (36.5 C) 97.8 F (36.6 C)    TempSrc: Temporal Temporal    SpO2: 98% 93% 95% 96%  Weight:      Height:       CBC:  Recent Labs  Lab 02/07/21 1145 02/10/21 0939  WBC 9.0 9.8  NEUTROABS 6.0 6.4  HGB 15.7* 15.6*  HCT 45.8 47.2*  MCV 86.1 87.1  PLT 301 583   Basic Metabolic Panel:  Recent Labs  Lab 02/07/21 1145  NA 139  K 3.4*  CL 105  CO2 25  GLUCOSE 298*  BUN 18  CREATININE 0.53  CALCIUM 9.2   Lipid Panel:  Recent Labs  Lab 02/08/21 0747  CHOL 130  TRIG 211*  HDL 33*  CHOLHDL 3.9  VLDL 42*  LDLCALC 55   HgbA1c:  Recent Labs  Lab 02/09/21 1523  HGBA1C 12.1*   Urine Drug Screen:  Recent Labs  Lab 02/07/21 1357  LABOPIA POSITIVE*  COCAINSCRNUR NONE DETECTED  LABBENZ NONE DETECTED  AMPHETMU NONE DETECTED  THCU NONE DETECTED  LABBARB NONE DETECTED    Alcohol Level No results for input(s): ETH in the last 168 hours.  IMAGING past 24 hours MR BRAIN W CONTRAST  Result Date: 02/10/2021 CLINICAL DATA:  Initial evaluation for neuro deficit, stroke suspected. EXAM: MRI HEAD WITH CONTRAST TECHNIQUE: Multiplanar, multiecho pulse sequences of the brain and surrounding structures were obtained with intravenous  contrast. CONTRAST:  23mL GADAVIST GADOBUTROL 1 MMOL/ML IV SOLN COMPARISON:  Comparison made with recent noncontrast brain MRI from 02/07/2021. FINDINGS: Brain: Age-related cerebral atrophy with moderately advanced chronic microvascular ischemic disease again noted. Few scattered remote lacunar infarcts noted as well. Findings seen and characterized on recent brain MRI from 02/07/2021. Following contrast administration, there is mild patchy post-contrast enhancement about the recently identified right thalamic capsular infarct (series 7, image 30). Additional subtle patchy enhancement about the previously identified left occipital infarct as well (series 9, image 17). Findings in keeping with probable early subacute ischemic infarcts. No other abnormal or pathologic enhancement within the brain. No other mass lesion, midline shift, or mass effect. No hydrocephalus or extra-axial fluid collection. No other new or progressive finding on this limited exam. Vascular: Normal intravascular enhancement seen throughout the major intracranial vascular structures. Skull and upper cervical spine: Craniocervical junction within normal limits. Bone marrow signal intensity normal. No scalp soft tissue abnormality. Sinuses/Orbits: Globes and orbital soft tissues within normal limits. Paranasal sinuses are largely clear. No mastoid effusion. Other: None. IMPRESSION: 1. Patchy post-contrast enhancement about the recently identified right thalamocapsular and left occipital infarcts, in keeping with early subacute ischemic infarcts. No associated mass effect. 2. Underlying age-related cerebral atrophy with moderately advanced chronic  microvascular ischemic disease. No other new intracranial abnormality on this limited postcontrast only exam. Electronically Signed   By: Jeannine Boga M.D.   On: 02/10/2021 00:02   ECHO TEE  Result Date: 02/10/2021    TRANSESOPHOGEAL ECHO REPORT   Patient Name:   Sheila Guerrero Date of  Exam: 02/10/2021 Medical Rec #:  [id number]          Height:       65.0 in Accession #:    [id number]         Weight:       182.0 lb Date of Birth:  [birth year]          BSA:          1.900 m Patient Age:    60 years           BP:           171/108 mmHg Patient Gender: F                  HR:           105 bpm. Exam Location:  Inpatient Procedure: Transesophageal Echo Indications:     Stroke  History:         Patient has prior history of Echocardiogram examinations, most                  recent 02/08/2021. Risk Factors:Hypertension. Acute CVA.  Sonographer:     Clayton Lefort RDCS (AE) Referring Phys:  [place] Diagnosing Phys: Dixie Dials MD PROCEDURE: After discussion of the risks and benefits of a TEE, an informed consent was obtained from the patient. The transesophogeal probe was passed without difficulty through the esophogus of the patient. Sedation performed by different physician. The patient was monitored while under deep sedation. Anesthestetic sedation was provided intravenously by Anesthesiology: 222.49mg  of Propofol, 60mg  of Lidocaine. Image quality was good. The patient developed no complications during the procedure. IMPRESSIONS  1. Left ventricular ejection fraction, by estimation, is 45 to 50%. The left ventricle has mildly decreased function. The left ventricle demonstrates global hypokinesis. Left ventricular diastolic function could not be evaluated.  2. Right ventricular systolic function is normal. The right ventricular size is normal.  3. Left atrial size was moderately dilated. No left atrial/left atrial appendage thrombus was detected.  4. Right atrial size was mildly dilated.  5. The pericardial effusion is posterior to the left ventricle. There is no evidence of cardiac tamponade.  6. Mobile small MV vegetations seen on both leaflets. The mitral valve is normal in structure. Moderate mitral valve regurgitation.  7. Sessile cauliflower shaped vegetation measuring 0.428 x 0.712 cm on  anterior leaflet mid portion of TV.  8. The aortic valve is tricuspid. Aortic valve regurgitation is not visualized. No aortic stenosis is present.  9. There is mild (Grade II) atheroma plaque involving the descending aorta and ascending aorta. 10. The inferior vena cava is normal in size with greater than 50% respiratory variability, suggesting right atrial pressure of 3 mmHg. 11. Evidence of atrial level shunting detected by color flow Doppler. Agitated saline contrast bubble study was negative, with no evidence of any interatrial shunt. There is a small patent foramen ovale with predominantly right to left shunting across the atrial septum. FINDINGS  Left Ventricle: Left ventricular ejection fraction, by estimation, is 45 to 50%. The left ventricle has mildly decreased function. The left ventricle demonstrates global hypokinesis. The left ventricular internal cavity size was normal in size.  There is  borderline concentric left ventricular hypertrophy. Left ventricular diastolic function could not be evaluated. Right Ventricle: The right ventricular size is normal. No increase in right ventricular wall thickness. Right ventricular systolic function is normal. Left Atrium: Left atrial size was moderately dilated. No left atrial/left atrial appendage thrombus was detected. Right Atrium: Right atrial size was mildly dilated. Pericardium: Trivial pericardial effusion is present. The pericardial effusion is posterior to the left ventricle. There is no evidence of cardiac tamponade. Mitral Valve: Mobile small MV vegetations seen on both leaflets. The mitral valve is normal in structure. Normal mobility of the mitral valve leaflets. Moderate mitral valve regurgitation, with centrally-directed jet. Tricuspid Valve: Sessile cauliflower shaped vegetation measuring 0.428 x 0.712 cm on anterior leaflet mid portion of TV. The tricuspid valve is normal in structure. Tricuspid valve regurgitation is mild . No evidence of  tricuspid stenosis. Aortic Valve: The aortic valve is tricuspid. Aortic valve regurgitation is not visualized. No aortic stenosis is present. Aortic valve mean gradient measures 3.0 mmHg. Aortic valve peak gradient measures 4.8 mmHg. Pulmonic Valve: The pulmonic valve was normal in structure. Pulmonic valve regurgitation is trivial. Aorta: The aortic root is normal in size and structure. There is mild (Grade II) atheroma plaque involving the descending aorta and ascending aorta. Venous: The left lower pulmonary vein and right lower pulmonary vein are normal. The inferior vena cava is normal in size with greater than 50% respiratory variability, suggesting right atrial pressure of 3 mmHg. IAS/Shunts: The interatrial septum appears to be lipomatous. Evidence of atrial level shunting detected by color flow Doppler. Agitated saline contrast was given intravenously to evaluate for intracardiac shunting. Agitated saline contrast bubble study was negative, with no evidence of any interatrial shunt. A small patent foramen ovale is detected with predominantly right to left shunting across the atrial septum.  AORTIC VALVE AV Vmax:      110.00 cm/s AV Vmean:     78.800 cm/s AV VTI:       0.176 m AV Peak Grad: 4.8 mmHg AV Mean Grad: 3.0 mmHg Dixie Dials MD Electronically signed by Dixie Dials MD Signature Date/Time: 02/10/2021/11:02:32 AM    Final     PHYSICAL EXAM Blood pressure 138/89, pulse 95, temperature 97.8 F (36.6 C), temperature source Temporal, resp. rate (!) 27, height 5\' 5"  (1.651 m), weight 82.6 kg, last menstrual period 11/25/2011, SpO2 96 %.  General: alert and awake, middle aged caucasian female, no apparent distress  Lungs: Symmetrical Chest rise, no labored breathing  Cardio: Regular Rate and Rhythm  Abdomen: Soft, non-tender  Neuro: Alert, oriented, thought content appropriate.  Speech fluent without evidence of aphasia.  Able to follow all commands without difficulty. Cranial Nerves: II:   Visual fields grossly normal, pupils equal, round, reactive to light and accommodation III,IV, VI: ptosis not present, extra-ocular motions intact bilaterally V,VII: smile symmetric, facial light touch diminished on the left VIII: hearing normal bilaterally IX,X: uvula rises symmetrically XI: bilateral shoulder shrug XII: midline tongue extension without atrophy or fasciculations  Motor: Right : Upper extremity   5/5    Left:     Upper extremity   5/5  Lower extremity   5/5     Lower extremity   5/5 Tone and bulk:normal tone throughout; no atrophy noted Sensory: light touch diminished on the left hemibody including face and forehead and does not split the midline.. Vibration diminished on the left side of body but sparing the forehead Cerebellar: normal finger-to-nose,  normal heel-to-shin test  Gait: deffered    ASSESSMENT/PLAN Ms. Sheila Guerrero is a 60 y.o. female with history of DM2, HTN, stroke in 2017, panic attacks, situational stress and dyslipidemia who presented to the ED on Saturday morning with a chief complaint of left sided numbness and tingling x 4 weeks in conjunction with progressive left sided weakness. The patient's symptoms began on February 14 with sudden onset of left face, arm and leg numbness in addition to tingling and weakness with some gait abnormality.She was seen by her PCP for this and was prescribed Lyrica for possible neuropathy. The Lyrica did not improve her symptoms, so she went to her Neurologist, Dr. Krista Blue, who ordered an MRI which was to be done this Tuesday. Dr. Krista Blue also switched the patient from her daily ASA, which she had been taking since her stroke in 2017, to Plavix. Per Dr. Rhea Belton note, the patient's prior MRI of the brain in 2018 was consistent with chronic small vessel disease with evidence of right dominant chronic cerebral microhemorrhage and chronic lacunar infarct. EEG in 2018 was normal. The patient also complains of right sided "sciatica pain"  due to favoring of her right side in the context of the above weakness. The pain extends along the anterolateral aspect of her right thigh to the lateral aspect of her knee. MRI brain at [facility] revealed a right thalamic subacute ischemic infarction as well as a small subacute left occipital lobe ischemic infarction. Numerous chronic microhemorrhages appearing most consistent with chronic hypertension were seen. Moderately advanced chronic small vessel ischemic changes were also noted.     Subacute right Thalamic Infarct and Subacute Left Occipital Lobe Infarct secondary to small vessel disease due to uncontrolled hypertension likely.  MRI WO Contrast: Small acute to early subacute infarct in the right thalamus/posterior limb of internal capsule. Small subacute left occipital lobe infarct. Moderately advanced chronic small vessel ischemic disease, progressed from 2018. Numerous chronic microhemorrhages consistent with chronic hypertension.  MRI W Contrast-subtle enhancement in the subacute right thalamic and left occipital infarcts MRA - Intracranial atherosclerosis including severe right M1 and moderate right V4 stenoses. No large vessel occlusion.  Carotid Doppler-bilateral 1-79% carotid stenosis   2D Echo - EF: 40-45% Left ventricular global hypokinesis. Moderate concentric Left ventricular hypertrophy.  LDL 55  HgbA1c No results found for requested labs within last 26280 hours.  VTE prophylaxis - Lovenox    Diet   Diet heart healthy/carb modified Room service appropriate? Yes; Fluid consistency: Thin    Aspirin 81 mg but not taking regularly prior to admission, now on aspirin 81 mg daily and clopidogrel 75 mg daily. Will stay on dual therapy for 3 weeks then stay on Aspirin 81 mg only.  Therapy recommendations:  Outpatient PT  Disposition: Home  Hypertension  Home meds:  Lisinopril (stopped)  Stable . Transitioning to [facility] . Starting Carvedilol . Long-term BP goal  normotensive  Hyperlipidemia  Home meds:  Lipitor 20 mg (changed)  LDL 55, goal < 70  Increase Lipitor to 80 mg daily  High intensity statin Lipitor 80 mg  Continue statin at discharge  Diabetes type II Unknown Status  Home meds:  Metformin  HgbA1c No results found for requested labs within last 26280 hours., goal < 7.0  CBGs Recent Labs    02/09/21 1631 02/09/21 2228 02/10/21 0625  GLUCAP 267* 200* 215*      SSI  Other Stroke Risk Factors  Obesity, Body mass index is 30.29 kg/m., BMI >/= 30 associated with increased stroke risk, recommend  weight loss, diet and exercise as appropriate   Hx stroke  Family hx stroke (Father- stroke)    Hospital day # 2  Patient MRI scan shows subacute right thalamic and left occipital infarct symptoms go back 4 weeks which are difficult to explain based on her MRI appearance of the strokes.  TEE surprisingly shows mitral valve vegetations Possibility of subacute endocarditis likely however she has no history of fever.  Recommend blood cultures and infectious disease consult.  She will likely need a PICC line 6 to 8 weeks of antibiotics.  Continue Topamax for paresthesias.  Recommend aspirin 81 mg and Plavix 75 mg daily for 3 weeks followed by aspirin alone and aggressive risk factor modification.  Long discussion with patient and her husband and answered questions.  Discussed with Dr.Ghimire Greater than 50% time during the 35-minute visit was spent out her paresthesias and strokes and answering questions. Stroke team will sign off.  Follow-up as an outpatient in stroke clinic in 6 weeks Antony Contras, MD Medical Director [place] Pager: [phone] 02/10/2021 1:00 PM   To contact Stroke Continuity provider, please refer to [email or web address] After hours, contact General Neurology

## 2021-02-10 NOTE — Anesthesia Procedure Notes (Signed)
Procedure Name: MAC Date/Time: 02/10/2021 7:57 AM Performed by: Trinna Post., CRNA Pre-anesthesia Checklist: Patient identified, Emergency Drugs available, Suction available, Patient being monitored and Timeout performed Patient Re-evaluated:Patient Re-evaluated prior to induction Oxygen Delivery Method: Nasal cannula Preoxygenation: Pre-oxygenation with 100% oxygen Induction Type: IV induction Placement Confirmation: positive ETCO2

## 2021-02-10 NOTE — Progress Notes (Signed)
Inpatient Diabetes Program Recommendations  AACE/ADA: New Consensus Statement on Inpatient Glycemic Control (2015)  Target Ranges:  Prepandial:   less than 140 mg/dL      Peak postprandial:   less than 180 mg/dL (1-2 hours)      Critically ill patients:  140 - 180 mg/dL   Lab Results  Component Value Date   GLUCAP 215 (H) 02/10/2021   HGBA1C 12.1 (H) 02/09/2021    Review of Glycemic Control Results for Sheila Guerrero, Sheila "[name]" (MRN [id number]) as of 02/10/2021 09:43  Ref. Range 02/09/2021 10:29 02/09/2021 13:09 02/09/2021 16:31 02/09/2021 22:28 02/10/2021 06:25  Glucose-Capillary Latest Ref Range: 70 - 99 mg/dL 390 (H) 127 (H) 267 (H) 200 (H) 215 (H)   Diabetes history: DM 2 Outpatient Diabetes medications: Metformin 2000 mg Qevening at supper Current orders for Inpatient glycemic control:  Lantus 10 units Novolog 0-15 units tid + hs  Novolog 4 units tid meal coverage  A1c October 2021 per pt report 13.5% only placed on metformin 2000 qevening at that time. A1c 12.1% this admission  Inpatient Diabetes Program Recommendations:    - Increase Lantus to 16 units (0.1 units/kg)  Spoke with pt at bedside on 3/14 see note. Pt reports seeing her PCP Physician assistant in October 2021 but had not seen anyone for 2 years prior to that. In October her A1c was 13.5% and was only placed on metformin 2000 mg Qsupper at that time.  D/C: Levemir preferred by insurance Insulin pen needles order # [id number]  Thanks,  Tama Headings RN, MSN, BC-ADM Inpatient Diabetes Coordinator Team Pager [phone] (8a-5p)

## 2021-02-10 NOTE — Progress Notes (Signed)
Pt refused vital signs after returning from TEE in AM, at 1300, and at 1600. Pt and husband educated by staff, continued to refuse. Will continue to attempt, and to monitor.

## 2021-02-10 NOTE — Progress Notes (Signed)
PT Cancellation Note  Patient Details Name: Sheila Guerrero MRN: [id number] DOB: 06/17/1961   Cancelled Treatment:    Reason Eval/Treat Not Completed: Patient at procedure or test/unavailable. Will check back as time allows.   Leighton Roach, [place]  Pager [phone] Office [place] 02/10/2021, 8:33 AM

## 2021-02-10 NOTE — Progress Notes (Signed)
Subjective:  Results of TEE noted.  Patient denies any fever or chills but states has chronic fatigue.  Has taken multiple courses of antibiotics in the past.  Blood culture has been sent ID consultation awaited  Objective:  Vital Signs in the last 24 hours: Temp:  [97.4 F (36.3 C)-98.1 F (36.7 C)] 97.8 F (36.6 C) (03/15 0831) Pulse Rate:  [90-105] 95 (03/15 0848) Resp:  [15-32] 27 (03/15 0848) BP: (111-159)/(69-106) 138/89 (03/15 0848) SpO2:  [93 %-100 %] 96 % (03/15 0848)  Intake/Output from previous day: 03/14 0701 - 03/15 0700 In: 1260 [P.O.:1260] Out: -  Intake/Output from this shift: Total I/O In: 400 [I.V.:400] Out: 0   Physical Exam: Exam unchanged  Lab Results: Recent Labs    02/10/21 0939  WBC 9.8  HGB 15.6*  PLT 262   No results for input(s): NA, K, CL, CO2, GLUCOSE, BUN, CREATININE in the last 72 hours. No results for input(s): TROPONINI in the last 72 hours.  Invalid input(s): CK, MB Hepatic Function Panel No results for input(s): PROT, ALBUMIN, AST, ALT, ALKPHOS, BILITOT, BILIDIR, IBILI in the last 72 hours. Recent Labs    02/08/21 0747  CHOL 130   No results for input(s): PROTIME in the last 72 hours.  Imaging: Imaging results have been reviewed and MR BRAIN W CONTRAST  Result Date: 02/10/2021 CLINICAL DATA:  Initial evaluation for neuro deficit, stroke suspected. EXAM: MRI HEAD WITH CONTRAST TECHNIQUE: Multiplanar, multiecho pulse sequences of the brain and surrounding structures were obtained with intravenous contrast. CONTRAST:  7mL GADAVIST GADOBUTROL 1 MMOL/ML IV SOLN COMPARISON:  Comparison made with recent noncontrast brain MRI from 02/07/2021. FINDINGS: Brain: Age-related cerebral atrophy with moderately advanced chronic microvascular ischemic disease again noted. Few scattered remote lacunar infarcts noted as well. Findings seen and characterized on recent brain MRI from 02/07/2021. Following contrast administration, there is mild patchy  post-contrast enhancement about the recently identified right thalamic capsular infarct (series 7, image 30). Additional subtle patchy enhancement about the previously identified left occipital infarct as well (series 9, image 17). Findings in keeping with probable early subacute ischemic infarcts. No other abnormal or pathologic enhancement within the brain. No other mass lesion, midline shift, or mass effect. No hydrocephalus or extra-axial fluid collection. No other new or progressive finding on this limited exam. Vascular: Normal intravascular enhancement seen throughout the major intracranial vascular structures. Skull and upper cervical spine: Craniocervical junction within normal limits. Bone marrow signal intensity normal. No scalp soft tissue abnormality. Sinuses/Orbits: Globes and orbital soft tissues within normal limits. Paranasal sinuses are largely clear. No mastoid effusion. Other: None. IMPRESSION: 1. Patchy post-contrast enhancement about the recently identified right thalamocapsular and left occipital infarcts, in keeping with early subacute ischemic infarcts. No associated mass effect. 2. Underlying age-related cerebral atrophy with moderately advanced chronic microvascular ischemic disease. No other new intracranial abnormality on this limited postcontrast only exam. Electronically Signed   By: Jeannine Boga M.D.   On: 02/10/2021 00:02   MR LUMBAR SPINE WO CONTRAST  Result Date: 02/10/2021 CLINICAL DATA:  Vigil take a shin seen on cardiac echo. New onset back pain. Question epidural abscess. EXAM: MRI LUMBAR SPINE WITHOUT CONTRAST TECHNIQUE: Multiplanar, multisequence MR imaging of the lumbar spine was performed. No intravenous contrast was administered. COMPARISON:  None. FINDINGS: Segmentation:  5 lumbar type vertebral bodies. Alignment: Scoliotic curvature convex to the left with the apex at L3. Vertebrae: No evidence of fracture. No sign of bone or joint infection. No sign of  infectious discitis. Benign appearing fat within the L1 vertebral body. This study was ordered and performed without contrast. Conus medullaris and cauda equina: Conus extends to the L1 level. Conus and cauda equina appear normal. I do not see any finding to suggest epidural infection. Contrast administration would increase sensitivity to early disease. Paraspinal and other soft tissues: Negative Disc levels: T12-L1 and L1-2: Normal L2-3: Mild bulging of the disc.  No compressive stenosis. L3-4: Moderate bulging of the disc. Mild narrowing of both lateral recesses. No compressive stenosis. L4-5: Mild bulging of the disc.  No compressive stenosis. L5-S1: Endplate osteophytes and shallow left posterolateral disc herniation. Facet and ligamentous hypertrophy. Narrowing of the subarticular lateral recess on the left that could possibly affect the left S1 nerve. IMPRESSION: 1. No sign of epidural infection or other spinal infection in the lumbar region. This study was ordered without contrast. Contrast administration would increase sensitivity to early disease. 2. Lumbar degenerative changes as outlined above. Left posterolateral disc herniation at L5-S1 with narrowing of the subarticular lateral recess on the left that could possibly affect the left S1 nerve. Electronically Signed   By: Nelson Chimes M.D.   On: 02/10/2021 13:05   MR HIP LEFT WO CONTRAST  Result Date: 02/10/2021 CLINICAL DATA:  Left hip pain. Heart valve vegetation. Concern for joint infection EXAM: MR OF THE LEFT HIP WITHOUT CONTRAST TECHNIQUE: Multiplanar, multisequence MR imaging was performed. No intravenous contrast was administered. COMPARISON:  09/08/2020 FINDINGS: Bones: No acute fracture. No dislocation. No femoral head avascular necrosis. No bony erosion or periostitis. Bony pelvis intact. SI joints and pubic symphysis within normal limits. Mild right hip osteoarthritis with associated labral degeneration. No right hip joint effusion.  Degenerative disc disease of L5-S1. Articular cartilage and labrum Articular cartilage: Mild chondral thinning and surface irregularity without focal defect. Labrum: Superior labral degeneration and tearing. 5 mm paralabral cyst along the anteroinferior margin of the labrum. Joint or bursal effusion Joint effusion:  None. Bursae: No bursal fluid collections. Muscles and tendons Muscles and tendons: Mild tendinosis of the bilateral hamstring tendon origins. The gluteal, iliopsoas, rectus femoris, and adductor tendons appear intact without tear or significant tendinosis. Normal muscle bulk and signal intensity without edema, atrophy, or fatty infiltration. Other findings Miscellaneous: No soft tissue edema or fluid collection. No inguinal lymphadenopathy. No acute findings within the pelvis. IMPRESSION: 1. No acute findings. Specifically, no evidence of septic arthritis or osteomyelitis. 2. Mild bilateral hip osteoarthritis with superior labral degeneration. 3. Mild bilateral hamstring tendinosis. Electronically Signed   By: Davina Poke D.O.   On: 02/10/2021 13:14   ECHO TEE  Result Date: 02/10/2021    TRANSESOPHOGEAL ECHO REPORT   Patient Name:   Sheila Guerrero Date of Exam: 02/10/2021 Medical Rec #:  [id number]          Height:       65.0 in Accession #:    [id number]         Weight:       182.0 lb Date of Birth:  01-20-1961          BSA:          1.900 m Patient Age:    60 years           BP:           171/108 mmHg Patient Gender: F                  HR:  105 bpm. Exam Location:  Inpatient Procedure: Transesophageal Echo Indications:     Stroke  History:         Patient has prior history of Echocardiogram examinations, most                  recent 02/08/2021. Risk Factors:Hypertension. Acute CVA.  Sonographer:     Clayton Lefort RDCS (AE) Referring Phys:  [place] Diagnosing Phys: Dixie Dials MD PROCEDURE: After discussion of the risks and benefits of a TEE, an informed consent was obtained  from the patient. The transesophogeal probe was passed without difficulty through the esophogus of the patient. Sedation performed by different physician. The patient was monitored while under deep sedation. Anesthestetic sedation was provided intravenously by Anesthesiology: 222.49mg  of Propofol, 60mg  of Lidocaine. Image quality was good. The patient developed no complications during the procedure. IMPRESSIONS  1. Left ventricular ejection fraction, by estimation, is 45 to 50%. The left ventricle has mildly decreased function. The left ventricle demonstrates global hypokinesis. Left ventricular diastolic function could not be evaluated.  2. Right ventricular systolic function is normal. The right ventricular size is normal.  3. Left atrial size was moderately dilated. No left atrial/left atrial appendage thrombus was detected.  4. Right atrial size was mildly dilated.  5. The pericardial effusion is posterior to the left ventricle. There is no evidence of cardiac tamponade.  6. Mobile small MV vegetations seen on both leaflets. The mitral valve is normal in structure. Moderate mitral valve regurgitation.  7. Sessile cauliflower shaped vegetation measuring 0.428 x 0.712 cm on anterior leaflet mid portion of TV.  8. The aortic valve is tricuspid. Aortic valve regurgitation is not visualized. No aortic stenosis is present.  9. There is mild (Grade II) atheroma plaque involving the descending aorta and ascending aorta. 10. The inferior vena cava is normal in size with greater than 50% respiratory variability, suggesting right atrial pressure of 3 mmHg. 11. Evidence of atrial level shunting detected by color flow Doppler. Agitated saline contrast bubble study was negative, with no evidence of any interatrial shunt. There is a small patent foramen ovale with predominantly right to left shunting across the atrial septum. FINDINGS  Left Ventricle: Left ventricular ejection fraction, by estimation, is 45 to 50%. The left  ventricle has mildly decreased function. The left ventricle demonstrates global hypokinesis. The left ventricular internal cavity size was normal in size. There is  borderline concentric left ventricular hypertrophy. Left ventricular diastolic function could not be evaluated. Right Ventricle: The right ventricular size is normal. No increase in right ventricular wall thickness. Right ventricular systolic function is normal. Left Atrium: Left atrial size was moderately dilated. No left atrial/left atrial appendage thrombus was detected. Right Atrium: Right atrial size was mildly dilated. Pericardium: Trivial pericardial effusion is present. The pericardial effusion is posterior to the left ventricle. There is no evidence of cardiac tamponade. Mitral Valve: Mobile small MV vegetations seen on both leaflets. The mitral valve is normal in structure. Normal mobility of the mitral valve leaflets. Moderate mitral valve regurgitation, with centrally-directed jet. Tricuspid Valve: Sessile cauliflower shaped vegetation measuring 0.428 x 0.712 cm on anterior leaflet mid portion of TV. The tricuspid valve is normal in structure. Tricuspid valve regurgitation is mild . No evidence of tricuspid stenosis. Aortic Valve: The aortic valve is tricuspid. Aortic valve regurgitation is not visualized. No aortic stenosis is present. Aortic valve mean gradient measures 3.0 mmHg. Aortic valve peak gradient measures 4.8 mmHg. Pulmonic Valve: The pulmonic valve  was normal in structure. Pulmonic valve regurgitation is trivial. Aorta: The aortic root is normal in size and structure. There is mild (Grade II) atheroma plaque involving the descending aorta and ascending aorta. Venous: The left lower pulmonary vein and right lower pulmonary vein are normal. The inferior vena cava is normal in size with greater than 50% respiratory variability, suggesting right atrial pressure of 3 mmHg. IAS/Shunts: The interatrial septum appears to be lipomatous.  Evidence of atrial level shunting detected by color flow Doppler. Agitated saline contrast was given intravenously to evaluate for intracardiac shunting. Agitated saline contrast bubble study was negative, with no evidence of any interatrial shunt. A small patent foramen ovale is detected with predominantly right to left shunting across the atrial septum.  AORTIC VALVE AV Vmax:      110.00 cm/s AV Vmean:     78.800 cm/s AV VTI:       0.176 m AV Peak Grad: 4.8 mmHg AV Mean Grad: 3.0 mmHg Dixie Dials MD Electronically signed by Dixie Dials MD Signature Date/Time: 02/10/2021/11:02:32 AM    Final    VAS US CAROTID  Result Date: 02/10/2021 Carotid Arterial Duplex Study Indications:       CVA. Risk Factors:      Hypertension, hyperlipidemia, Diabetes, prior CVA. Comparison Study:  03-28-2017 Prior carotid duplex showed 1-39% stenosis of the                    ICA bilaterally. Performing Technologist: Darlin Coco RDMS,RVT  Examination Guidelines: A complete evaluation includes B-mode imaging, spectral Doppler, color Doppler, and power Doppler as needed of all accessible portions of each vessel. Bilateral testing is considered an integral part of a complete examination. Limited examinations for reoccurring indications may be performed as noted.  Right Carotid Findings: +----------+--------+--------+--------+------------------+--------+           PSV cm/sEDV cm/sStenosisPlaque DescriptionComments +----------+--------+--------+--------+------------------+--------+ CCA Prox  61      10                                         +----------+--------+--------+--------+------------------+--------+ CCA Distal67      13                                         +----------+--------+--------+--------+------------------+--------+ ICA Prox  52      23      1-39%   heterogenous               +----------+--------+--------+--------+------------------+--------+ ICA Distal72      30                                          +----------+--------+--------+--------+------------------+--------+ ECA       69      8                                          +----------+--------+--------+--------+------------------+--------+ +----------+--------+-------+----------------+-------------------+           PSV cm/sEDV cmsDescribe        Arm Pressure (mmHG) +----------+--------+-------+----------------+-------------------+ Subclavian119  Multiphasic, WNL                    +----------+--------+-------+----------------+-------------------+ +---------+--------+--+--------+--+---------+ VertebralPSV cm/s43EDV cm/s11Antegrade +---------+--------+--+--------+--+---------+  Left Carotid Findings: +----------+--------+--------+--------+------------------+--------+           PSV cm/sEDV cm/sStenosisPlaque DescriptionComments +----------+--------+--------+--------+------------------+--------+ CCA Prox  97      17                                         +----------+--------+--------+--------+------------------+--------+ CCA Distal65      16                                         +----------+--------+--------+--------+------------------+--------+ ICA Prox  54      27      1-39%   heterogenous               +----------+--------+--------+--------+------------------+--------+ ICA Distal77      37                                         +----------+--------+--------+--------+------------------+--------+ ECA       91      9                                          +----------+--------+--------+--------+------------------+--------+ +----------+--------+--------+----------------+-------------------+           PSV cm/sEDV cm/sDescribe        Arm Pressure (mmHG) +----------+--------+--------+----------------+-------------------+ Subclavian129             Multiphasic, WNL                    +----------+--------+--------+----------------+-------------------+  +---------+--------+--+--------+--+---------+ VertebralPSV cm/s53EDV cm/s23Antegrade +---------+--------+--+--------+--+---------+   Summary: Right Carotid: Velocities in the right ICA are consistent with a 1-39% stenosis. Left Carotid: Velocities in the left ICA are consistent with a 1-39% stenosis. Vertebrals:  Bilateral vertebral arteries demonstrate antegrade flow. Subclavians: Normal flow hemodynamics were seen in bilateral subclavian              arteries. *See table(s) above for measurements and observations.  Electronically signed by Antony Contras MD on 02/10/2021 at 12:50:53 PM.    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

## 2021-02-10 NOTE — Progress Notes (Signed)
PT Cancellation Note  Patient Details Name: Sheila Guerrero MRN: [id number] DOB: Jun 26, 1961   Cancelled Treatment:    Reason Eval/Treat Not Completed: Other (comment) (Pt working with OT). Will see tomorrow.   Leighton Roach, PT  Acute Rehab Services  Pager [phone] Office [place] 02/10/2021, 3:31 PM

## 2021-02-10 NOTE — Progress Notes (Signed)
Occupational Therapy Re-eval  Patient Details Name: Juan Olthoff MRN: [id number] DOB: 1960/12/17 Today's Date: 02/10/2021    History of present illness Pt adm to [facility] 3/12 with several week history of paresthesias, pain, and progressive weakness on lt side. Pt seen by Dr. Krista Blue, outpatient neurology, on 3/7 with concern for rt thalamic infarct but unable to have MRI scheduled until 3/15. Pt developed right lower extremity sciatica. MRI showed small acute to early subacute infarct in the right  thalamus/posterior limb of internal capsule. and small subacute left occipital lobe infarct. Pt transferred to [facility] on 12/13. PMH -angioedema, htn, DM, anxiety, depression, CVA in 2017.    After stroke in 2017 pt reports that she gets lost easily in well known areas and difficulty finding objects as well difficulty with time and date.   OT comments  Patient agreeable to OT session, re-ordered after transfer to [facility]. [facility] OT signed off patient, but since DC did not occur will resume OT services acutely.  Patient remains modified independent with basic ADLs, encouraged use of L hand functionally. Reviewed recommendations for outpatient OT services and follow up with optometrist, reviewed and provided exercises programs for L UE strength, coordination. Will follow acutely.   Follow Up Recommendations  Outpatient OT    Equipment Recommendations  None recommended by OT    Recommendations for Other Services      Precautions / Restrictions Precautions Precautions: Fall Restrictions Weight Bearing Restrictions: No       Mobility Bed Mobility Overal bed mobility: Modified Independent                  Transfers Overall transfer level: Modified independent                    Balance Overall balance assessment: Needs assistance Sitting-balance support: No upper extremity supported;Feet supported Sitting balance-Leahy Scale: Good     Standing balance support: No upper extremity  supported;During functional activity Standing balance-Leahy Scale: Fair                             ADL either performed or assessed with clinical judgement   ADL Overall ADL's : Modified independent                                       General ADL Comments: pt demonstrates ability to complete basic ADLs without assist, encouarged use of L hand functionally.  Utilized L hand with opening items at sink, managing cup filled with water.     Vision   Additional Comments: NT this session   Perception     Praxis      Cognition Arousal/Alertness: Awake/alert Behavior During Therapy: WFL for tasks assessed/performed Overall Cognitive Status: History of cognitive impairments - at baseline                                 General Comments: hx of executive functioning difficulties since CVA in 2017, compensates well        Exercises Exercises: Other exercises Other Exercises Other Exercises: Provided HEP for [facility] exercises and theraputty to L hand, reviewed and completed 10 reps tip to tip, ab/adduction, finger extension isolaion and thumb circles Other Exercises: Provided level 2 theraband, x 10 reps L shoulder flexion, diagonals and punches with  shoulder retraction   Shoulder Instructions       General Comments      Pertinent Vitals/ Pain       Pain Assessment: No/denies pain  Home Living                                          Prior Functioning/Environment              Frequency  Min 2X/week        Progress Toward Goals  OT Goals(current goals can now be found in the care plan section)     Acute Rehab OT Goals Patient Stated Goal: Regain IND OT Goal Formulation: With patient  Plan Frequency remains appropriate;Discharge plan remains appropriate    Co-evaluation                 AM-PAC OT "6 Clicks" Daily Activity     Outcome Measure   Help from another person eating meals?:  None Help from another person taking care of personal grooming?: None Help from another person toileting, which includes using toliet, bedpan, or urinal?: None Help from another person bathing (including washing, rinsing, drying)?: None Help from another person to put on and taking off regular upper body clothing?: None Help from another person to put on and taking off regular lower body clothing?: None 6 Click Score: 24    End of Session    OT Visit Diagnosis: Unsteadiness on feet (R26.81);Other symptoms and signs involving cognitive function;Other symptoms and signs involving the nervous system (R29.898);Pain   Activity Tolerance Patient tolerated treatment well   Patient Left with call bell/phone within reach;in bed   Nurse Communication Mobility status        Time: 0350-0938 OT Time Calculation (min): 27 min  Charges: OT General Charges $OT Visit: 1 Visit OT Evaluation $OT Re-eval: 1 Re-eval OT Treatments $Self Care/Home Management : 8-22 mins  Jolaine Artist, OT Acute Rehabilitation Services Pager [phone] Office [phone]    Delight Stare 02/10/2021, 3:25 PM

## 2021-02-10 NOTE — Progress Notes (Signed)
OT Cancellation Note  Patient Details Name: Khilynn Borntreger MRN: [id number] DOB: July 15, 1961   Cancelled Treatment:    Reason Eval/Treat Not Completed: Patient at procedure or test/ unavailable- pt off unit at TEE. Will follow and see as able.   Jolaine Artist, OT Acute Rehabilitation Services Pager [phone] Office [phone]   Delight Stare 02/10/2021, 7:53 AM

## 2021-02-10 NOTE — Progress Notes (Signed)
PROGRESS NOTE    Sheila Guerrero  UVO:[id number] DOB: June 25, 1961 DOA: 02/07/2021 PCP: Donald Prose, MD    Brief Narrative:  60 year old female with history of hypertension, hyperlipidemia, type 2 diabetes on insulin, anxiety and depression, stroke in 2017 with no neurological deficits presented to the ER with 4 weeks of left-sided weakness and numbness.  She also has history of neuropathy.  Patient was having outpatient work-up, she was noted to have significant weakness on the left side so sent to ER.  MRI shows acute/subacute stroke. 3/15, underwent TEE and surprisingly found to have mitral valve and tricuspid valve endocarditis with no evidence of systemic infection.  Clinically improving.   Assessment & Plan:   Principal Problem:   Right thalamic infarction [facility]) Active Problems:   Type 2 diabetes mellitus with hyperlipidemia (HCC)   Paresthesia   Essential hypertension   Acute ischemic stroke (HCC)   Malnutrition of moderate degree  Acute/subacute ischemic stroke right MCA: Right-sided sensory deficit with mild ataxia. MRI of the brain, small acute right thalamus/posterior limb of internal capsule infarct.  Left occipital lobe infarct. MRA of the head, Severe right M1, moderate right V4 stenosis.  No large vessel occlusion. 2D echocardiogram, EF 40 to 45% with global hypokinesis.  Moderate concentric LVH.  No source of embolism. Carotid duplex, 1 to 39% stenosis. LDL 55, on goal. A1c, 12.1.  Very high.  Doubt compliance.  Will discuss with patient about insulin at home. Intervention, dual antiplatelet therapy with aspirin and Plavix for 3 weeks due severe intracranial atherosclerotic disease.  High-dose statin with Lipitor 80 mg daily. Seen by PT OT.  Recommended home health PT OT.  Continue mobilizing with nursing supervision.  Mitral valve/tricuspid valve vegetation: Accidental finding of both right and left-sided vegetations on TEE exam.  Patient with some chronic  symptoms of weakness and fatigue but no fever, recent infection or interventions. Patient with new onset right hip pain. MRI right hip with no evidence of infection, septic arthritis. MRI of the lumbar spine with no evidence of infections. Blood cultures drawn. Possible aseptic endocarditis.  Discussed with infectious disease, they will see patient in consultation.  No localizing source of infection yet.  Cardiomyopathy: Suspect hypertensive cardiomyopathy.  Lisinopril discontinued 3/13.  Is started on beta-blockers and Entresto.   Hypokalemia: Replaced.  Adequate.  Sciatica: On Lyrica.  Significant pain.  MRI negative.  We will add pain medication and muscle relaxant.  She was planning to go to chiropractor today.  Essential hypertension: Blood pressure stable.  Type 2 diabetes with hyperlipidemia: A1c is 12.  Poor compliance.  Holding Metformin.  Currently on insulin.  Anxiety/depression: Stable.   DVT prophylaxis: enoxaparin (LOVENOX) injection 40 mg Start: 02/07/21 1830   Code Status: Full code Family Communication: Husband at the bedside. Disposition Plan: Status is: Inpatient  Remains inpatient appropriate because:Inpatient level of care appropriate due to severity of illness   Dispo: The patient is from: Home              Anticipated d/c is to: Home              Patient currently is not medically stable to d/c.   Difficult to place patient No         Consultants:   Neurology  Cardiology  Procedures:   None  Antimicrobials:   None   Subjective: Patient seen and examined.  When I examined her, she had come back from TEE and dressed up and ready to go home.  She does not want to stay in the hospital. We discussed about new findings and necessary testing to rule out infections and patient is agreeable.  Her husband arrived and they will complete the investigations. Case discussed with infectious disease and neurology.  Objective: Vitals:   02/10/21  0730 02/10/21 0831 02/10/21 0839 02/10/21 0848  BP: (!) 154/106 111/69 116/72 138/89  Pulse: (!) 103 90 92 95  Resp: (!) 23 (!) 32 (!) 21 (!) 27  Temp: 97.7 F (36.5 C) 97.8 F (36.6 C)    TempSrc: Temporal Temporal    SpO2: 98% 93% 95% 96%  Weight:      Height:        Intake/Output Summary (Last 24 hours) at 02/10/2021 1322 Last data filed at 02/10/2021 0827 Gross per 24 hour  Intake 940 ml  Output 0 ml  Net 940 ml   Filed Weights   02/07/21 1047  Weight: 82.6 kg    Examination:  General exam: Appears calm and comfortable , anxious to leave. Respiratory system: Clear to auscultation. Respiratory effort normal. Cardiovascular system: S1 & S2 heard, RRR. No JVD, murmurs, rubs, gallops or clicks. No pedal edema. Gastrointestinal system: Abdomen is nondistended, soft and nontender. No organomegaly or masses felt. Normal bowel sounds heard. Central nervous system: Alert and oriented.  Motor examination is grossly normal.  Patient has slight dragging of the left leg, however gross neurological exam is normal. Right hip with full range of motion, no elicited tenderness. Extremities: Symmetric 5 x 5 power. Skin: No rashes, lesions or ulcers Psychiatry: Judgement and insight appear normal. Mood & affect appropriate.     Data Reviewed: I have personally reviewed following labs and imaging studies  CBC: Recent Labs  Lab 02/07/21 1145 02/10/21 0939  WBC 9.0 9.8  NEUTROABS 6.0 6.4  HGB 15.7* 15.6*  HCT 45.8 47.2*  MCV 86.1 87.1  PLT 301 762   Basic Metabolic Panel: Recent Labs  Lab 02/07/21 1145  NA 139  K 3.4*  CL 105  CO2 25  GLUCOSE 298*  BUN 18  CREATININE 0.53  CALCIUM 9.2   GFR: Estimated Creatinine Clearance: 81.3 mL/min (by C-G formula based on SCr of 0.53 mg/dL). Liver Function Tests: Recent Labs  Lab 02/07/21 1145  AST 16  ALT 21  ALKPHOS 71  BILITOT 0.8  PROT 6.8  ALBUMIN 3.8   No results for input(s): LIPASE, AMYLASE in the last 168  hours. No results for input(s): AMMONIA in the last 168 hours. Coagulation Profile: Recent Labs  Lab 02/07/21 1145  INR 1.0   Cardiac Enzymes: Recent Labs  Lab 02/08/21 0742  CKTOTAL 40   BNP (last 3 results) No results for input(s): PROBNP in the last 8760 hours. HbA1C: Recent Labs    02/09/21 1523  HGBA1C 12.1*   CBG: Recent Labs  Lab 02/09/21 1029 02/09/21 1309 02/09/21 1631 02/09/21 2228 02/10/21 0625  GLUCAP 390* 127* 267* 200* 215*   Lipid Profile: Recent Labs    02/08/21 0747  CHOL 130  HDL 33*  LDLCALC 55  TRIG 211*  CHOLHDL 3.9   Thyroid Function Tests: No results for input(s): TSH, T4TOTAL, FREET4, T3FREE, THYROIDAB in the last 72 hours. Anemia Panel: No results for input(s): VITAMINB12, FOLATE, FERRITIN, TIBC, IRON, RETICCTPCT in the last 72 hours. Sepsis Labs: No results for input(s): PROCALCITON, LATICACIDVEN in the last 168 hours.  Recent Results (from the past 240 hour(s))  Resp Panel by RT-PCR (Flu A&B, Covid) Nasopharyngeal Swab  Status: None   Collection Time: 02/07/21  3:00 PM   Specimen: Nasopharyngeal Swab; Nasopharyngeal(NP) swabs in vial transport medium  Result Value Ref Range Status   SARS Coronavirus 2 by RT PCR NEGATIVE NEGATIVE Final    Comment: (NOTE) SARS-CoV-2 target nucleic acids are NOT DETECTED.  The SARS-CoV-2 RNA is generally detectable in upper respiratory specimens during the acute phase of infection. The lowest concentration of SARS-CoV-2 viral copies this assay can detect is 138 copies/mL. A negative result does not preclude SARS-Cov-2 infection and should not be used as the sole basis for treatment or other patient management decisions. A negative result may occur with  improper specimen collection/handling, submission of specimen other than nasopharyngeal swab, presence of viral mutation(s) within the areas targeted by this assay, and inadequate number of viral copies(<138 copies/mL). A negative result  must be combined with clinical observations, patient history, and epidemiological information. The expected result is Negative.  Fact Sheet for Patients:  EntrepreneurPulse.com.au  Fact Sheet for Healthcare Providers:  IncredibleEmployment.be  This test is no t yet approved or cleared by the [place] FDA and  has been authorized for detection and/or diagnosis of SARS-CoV-2 by FDA under an Emergency Use Authorization (EUA). This EUA will remain  in effect (meaning this test can be used) for the duration of the COVID-19 declaration under Section 564(b)(1) of the Act, 21 U.S.C.section 360bbb-3(b)(1), unless the authorization is terminated  or revoked sooner.       Influenza A by PCR NEGATIVE NEGATIVE Final   Influenza B by PCR NEGATIVE NEGATIVE Final    Comment: (NOTE) The Xpert Xpress SARS-CoV-2/FLU/RSV plus assay is intended as an aid in the diagnosis of influenza from Nasopharyngeal swab specimens and should not be used as a sole basis for treatment. Nasal washings and aspirates are unacceptable for Xpert Xpress SARS-CoV-2/FLU/RSV testing.  Fact Sheet for Patients: EntrepreneurPulse.com.au  Fact Sheet for Healthcare Providers: IncredibleEmployment.be  This test is not yet approved or cleared by the [place] FDA and has been authorized for detection and/or diagnosis of SARS-CoV-2 by FDA under an Emergency Use Authorization (EUA). This EUA will remain in effect (meaning this test can be used) for the duration of the COVID-19 declaration under Section 564(b)(1) of the Act, 21 U.S.C. section 360bbb-3(b)(1), unless the authorization is terminated or revoked.  Performed at [facility], [place] [address] [place], [place] [address]          Radiology Studies: MR BRAIN W CONTRAST  Result Date: 02/10/2021 CLINICAL DATA:  Initial evaluation for neuro deficit, stroke suspected.  EXAM: MRI HEAD WITH CONTRAST TECHNIQUE: Multiplanar, multiecho pulse sequences of the brain and surrounding structures were obtained with intravenous contrast. CONTRAST:  8mL GADAVIST GADOBUTROL 1 MMOL/ML IV SOLN COMPARISON:  Comparison made with recent noncontrast brain MRI from 02/07/2021. FINDINGS: Brain: Age-related cerebral atrophy with moderately advanced chronic microvascular ischemic disease again noted. Few scattered remote lacunar infarcts noted as well. Findings seen and characterized on recent brain MRI from 02/07/2021. Following contrast administration, there is mild patchy post-contrast enhancement about the recently identified right thalamic capsular infarct (series 7, image 30). Additional subtle patchy enhancement about the previously identified left occipital infarct as well (series 9, image 17). Findings in keeping with probable early subacute ischemic infarcts. No other abnormal or pathologic enhancement within the brain. No other mass lesion, midline shift, or mass effect. No hydrocephalus or extra-axial fluid collection. No other new or progressive finding on this limited exam. Vascular: Normal intravascular enhancement seen  throughout the major intracranial vascular structures. Skull and upper cervical spine: Craniocervical junction within normal limits. Bone marrow signal intensity normal. No scalp soft tissue abnormality. Sinuses/Orbits: Globes and orbital soft tissues within normal limits. Paranasal sinuses are largely clear. No mastoid effusion. Other: None. IMPRESSION: 1. Patchy post-contrast enhancement about the recently identified right thalamocapsular and left occipital infarcts, in keeping with early subacute ischemic infarcts. No associated mass effect. 2. Underlying age-related cerebral atrophy with moderately advanced chronic microvascular ischemic disease. No other new intracranial abnormality on this limited postcontrast only exam. Electronically Signed   By: Jeannine Boga M.D.   On: 02/10/2021 00:02   MR LUMBAR SPINE WO CONTRAST  Result Date: 02/10/2021 CLINICAL DATA:  Vigil take a shin seen on cardiac echo. New onset back pain. Question epidural abscess. EXAM: MRI LUMBAR SPINE WITHOUT CONTRAST TECHNIQUE: Multiplanar, multisequence MR imaging of the lumbar spine was performed. No intravenous contrast was administered. COMPARISON:  None. FINDINGS: Segmentation:  5 lumbar type vertebral bodies. Alignment: Scoliotic curvature convex to the left with the apex at L3. Vertebrae: No evidence of fracture. No sign of bone or joint infection. No sign of infectious discitis. Benign appearing fat within the L1 vertebral body. This study was ordered and performed without contrast. Conus medullaris and cauda equina: Conus extends to the L1 level. Conus and cauda equina appear normal. I do not see any finding to suggest epidural infection. Contrast administration would increase sensitivity to early disease. Paraspinal and other soft tissues: Negative Disc levels: T12-L1 and L1-2: Normal L2-3: Mild bulging of the disc.  No compressive stenosis. L3-4: Moderate bulging of the disc. Mild narrowing of both lateral recesses. No compressive stenosis. L4-5: Mild bulging of the disc.  No compressive stenosis. L5-S1: Endplate osteophytes and shallow left posterolateral disc herniation. Facet and ligamentous hypertrophy. Narrowing of the subarticular lateral recess on the left that could possibly affect the left S1 nerve. IMPRESSION: 1. No sign of epidural infection or other spinal infection in the lumbar region. This study was ordered without contrast. Contrast administration would increase sensitivity to early disease. 2. Lumbar degenerative changes as outlined above. Left posterolateral disc herniation at L5-S1 with narrowing of the subarticular lateral recess on the left that could possibly affect the left S1 nerve. Electronically Signed   By: Nelson Chimes M.D.   On: 02/10/2021 13:05   MR  HIP LEFT WO CONTRAST  Result Date: 02/10/2021 CLINICAL DATA:  Left hip pain. Heart valve vegetation. Concern for joint infection EXAM: MR OF THE LEFT HIP WITHOUT CONTRAST TECHNIQUE: Multiplanar, multisequence MR imaging was performed. No intravenous contrast was administered. COMPARISON:  09/08/2020 FINDINGS: Bones: No acute fracture. No dislocation. No femoral head avascular necrosis. No bony erosion or periostitis. Bony pelvis intact. SI joints and pubic symphysis within normal limits. Mild right hip osteoarthritis with associated labral degeneration. No right hip joint effusion. Degenerative disc disease of L5-S1. Articular cartilage and labrum Articular cartilage: Mild chondral thinning and surface irregularity without focal defect. Labrum: Superior labral degeneration and tearing. 5 mm paralabral cyst along the anteroinferior margin of the labrum. Joint or bursal effusion Joint effusion:  None. Bursae: No bursal fluid collections. Muscles and tendons Muscles and tendons: Mild tendinosis of the bilateral hamstring tendon origins. The gluteal, iliopsoas, rectus femoris, and adductor tendons appear intact without tear or significant tendinosis. Normal muscle bulk and signal intensity without edema, atrophy, or fatty infiltration. Other findings Miscellaneous: No soft tissue edema or fluid collection. No inguinal lymphadenopathy. No acute findings within the  pelvis. IMPRESSION: 1. No acute findings. Specifically, no evidence of septic arthritis or osteomyelitis. 2. Mild bilateral hip osteoarthritis with superior labral degeneration. 3. Mild bilateral hamstring tendinosis. Electronically Signed   By: Davina Poke D.O.   On: 02/10/2021 13:14   ECHO TEE  Result Date: 02/10/2021    TRANSESOPHOGEAL ECHO REPORT   Patient Name:   CONTINA STRAIN Date of Exam: 02/10/2021 Medical Rec #:  [id number]          Height:       65.0 in Accession #:    [id number]         Weight:       182.0 lb Date of Birth:  06-26-1961           BSA:          1.900 m Patient Age:    67 years           BP:           171/108 mmHg Patient Gender: F                  HR:           105 bpm. Exam Location:  Inpatient Procedure: Transesophageal Echo Indications:     Stroke  History:         Patient has prior history of Echocardiogram examinations, most                  recent 02/08/2021. Risk Factors:Hypertension. Acute CVA.  Sonographer:     Clayton Lefort RDCS (AE) Referring Phys:  [place] Diagnosing Phys: Dixie Dials MD PROCEDURE: After discussion of the risks and benefits of a TEE, an informed consent was obtained from the patient. The transesophogeal probe was passed without difficulty through the esophogus of the patient. Sedation performed by different physician. The patient was monitored while under deep sedation. Anesthestetic sedation was provided intravenously by Anesthesiology: 222.49mg  of Propofol, 60mg  of Lidocaine. Image quality was good. The patient developed no complications during the procedure. IMPRESSIONS  1. Left ventricular ejection fraction, by estimation, is 45 to 50%. The left ventricle has mildly decreased function. The left ventricle demonstrates global hypokinesis. Left ventricular diastolic function could not be evaluated.  2. Right ventricular systolic function is normal. The right ventricular size is normal.  3. Left atrial size was moderately dilated. No left atrial/left atrial appendage thrombus was detected.  4. Right atrial size was mildly dilated.  5. The pericardial effusion is posterior to the left ventricle. There is no evidence of cardiac tamponade.  6. Mobile small MV vegetations seen on both leaflets. The mitral valve is normal in structure. Moderate mitral valve regurgitation.  7. Sessile cauliflower shaped vegetation measuring 0.428 x 0.712 cm on anterior leaflet mid portion of TV.  8. The aortic valve is tricuspid. Aortic valve regurgitation is not visualized. No aortic stenosis is present.  9. There is  mild (Grade II) atheroma plaque involving the descending aorta and ascending aorta. 10. The inferior vena cava is normal in size with greater than 50% respiratory variability, suggesting right atrial pressure of 3 mmHg. 11. Evidence of atrial level shunting detected by color flow Doppler. Agitated saline contrast bubble study was negative, with no evidence of any interatrial shunt. There is a small patent foramen ovale with predominantly right to left shunting across the atrial septum. FINDINGS  Left Ventricle: Left ventricular ejection fraction, by estimation, is 45 to 50%. The left ventricle has mildly decreased function. The left  ventricle demonstrates global hypokinesis. The left ventricular internal cavity size was normal in size. There is  borderline concentric left ventricular hypertrophy. Left ventricular diastolic function could not be evaluated. Right Ventricle: The right ventricular size is normal. No increase in right ventricular wall thickness. Right ventricular systolic function is normal. Left Atrium: Left atrial size was moderately dilated. No left atrial/left atrial appendage thrombus was detected. Right Atrium: Right atrial size was mildly dilated. Pericardium: Trivial pericardial effusion is present. The pericardial effusion is posterior to the left ventricle. There is no evidence of cardiac tamponade. Mitral Valve: Mobile small MV vegetations seen on both leaflets. The mitral valve is normal in structure. Normal mobility of the mitral valve leaflets. Moderate mitral valve regurgitation, with centrally-directed jet. Tricuspid Valve: Sessile cauliflower shaped vegetation measuring 0.428 x 0.712 cm on anterior leaflet mid portion of TV. The tricuspid valve is normal in structure. Tricuspid valve regurgitation is mild . No evidence of tricuspid stenosis. Aortic Valve: The aortic valve is tricuspid. Aortic valve regurgitation is not visualized. No aortic stenosis is present. Aortic valve mean gradient  measures 3.0 mmHg. Aortic valve peak gradient measures 4.8 mmHg. Pulmonic Valve: The pulmonic valve was normal in structure. Pulmonic valve regurgitation is trivial. Aorta: The aortic root is normal in size and structure. There is mild (Grade II) atheroma plaque involving the descending aorta and ascending aorta. Venous: The left lower pulmonary vein and right lower pulmonary vein are normal. The inferior vena cava is normal in size with greater than 50% respiratory variability, suggesting right atrial pressure of 3 mmHg. IAS/Shunts: The interatrial septum appears to be lipomatous. Evidence of atrial level shunting detected by color flow Doppler. Agitated saline contrast was given intravenously to evaluate for intracardiac shunting. Agitated saline contrast bubble study was negative, with no evidence of any interatrial shunt. A small patent foramen ovale is detected with predominantly right to left shunting across the atrial septum.  AORTIC VALVE AV Vmax:      110.00 cm/s AV Vmean:     78.800 cm/s AV VTI:       0.176 m AV Peak Grad: 4.8 mmHg AV Mean Grad: 3.0 mmHg Dixie Dials MD Electronically signed by Dixie Dials MD Signature Date/Time: 02/10/2021/11:02:32 AM    Final    VAS US CAROTID  Result Date: 02/10/2021 Carotid Arterial Duplex Study Indications:       CVA. Risk Factors:      Hypertension, hyperlipidemia, Diabetes, prior CVA. Comparison Study:  03-28-2017 Prior carotid duplex showed 1-39% stenosis of the                    ICA bilaterally. Performing Technologist: Darlin Coco RDMS,RVT  Examination Guidelines: A complete evaluation includes B-mode imaging, spectral Doppler, color Doppler, and power Doppler as needed of all accessible portions of each vessel. Bilateral testing is considered an integral part of a complete examination. Limited examinations for reoccurring indications may be performed as noted.  Right Carotid Findings: +----------+--------+--------+--------+------------------+--------+            PSV cm/sEDV cm/sStenosisPlaque DescriptionComments +----------+--------+--------+--------+------------------+--------+ CCA Prox  61      10                                         +----------+--------+--------+--------+------------------+--------+ CCA Distal67      13                                         +----------+--------+--------+--------+------------------+--------+  ICA Prox  52      23      1-39%   heterogenous               +----------+--------+--------+--------+------------------+--------+ ICA Distal72      30                                         +----------+--------+--------+--------+------------------+--------+ ECA       69      8                                          +----------+--------+--------+--------+------------------+--------+ +----------+--------+-------+----------------+-------------------+           PSV cm/sEDV cmsDescribe        Arm Pressure (mmHG) +----------+--------+-------+----------------+-------------------+ Subclavian119            Multiphasic, WNL                    +----------+--------+-------+----------------+-------------------+ +---------+--------+--+--------+--+---------+ VertebralPSV cm/s43EDV cm/s11Antegrade +---------+--------+--+--------+--+---------+  Left Carotid Findings: +----------+--------+--------+--------+------------------+--------+           PSV cm/sEDV cm/sStenosisPlaque DescriptionComments +----------+--------+--------+--------+------------------+--------+ CCA Prox  97      17                                         +----------+--------+--------+--------+------------------+--------+ CCA Distal65      16                                         +----------+--------+--------+--------+------------------+--------+ ICA Prox  54      27      1-39%   heterogenous               +----------+--------+--------+--------+------------------+--------+ ICA Distal77      37                                          +----------+--------+--------+--------+------------------+--------+ ECA       91      9                                          +----------+--------+--------+--------+------------------+--------+ +----------+--------+--------+----------------+-------------------+           PSV cm/sEDV cm/sDescribe        Arm Pressure (mmHG) +----------+--------+--------+----------------+-------------------+ Subclavian129             Multiphasic, WNL                    +----------+--------+--------+----------------+-------------------+ +---------+--------+--+--------+--+---------+ VertebralPSV cm/s53EDV cm/s23Antegrade +---------+--------+--+--------+--+---------+   Summary: Right Carotid: Velocities in the right ICA are consistent with a 1-39% stenosis. Left Carotid: Velocities in the left ICA are consistent with a 1-39% stenosis. Vertebrals:  Bilateral vertebral arteries demonstrate antegrade flow. Subclavians: Normal flow hemodynamics were seen in bilateral subclavian              arteries. *See table(s) above for measurements and observations.  Electronically signed by Mamie Nick  Sethi MD on 02/10/2021 at 12:50:53 PM.    Final         Scheduled Meds: . aspirin EC  81 mg Oral Daily  . atorvastatin  80 mg Oral Daily  . carvedilol  3.125 mg Oral BID WC  . clopidogrel  75 mg Oral Daily  . docusate sodium  100 mg Oral Daily  . enoxaparin (LOVENOX) injection  40 mg Subcutaneous Q24H  . insulin aspart  0-15 Units Subcutaneous TID WC  . insulin aspart  0-5 Units Subcutaneous QHS  . insulin aspart  4 Units Subcutaneous TID WC  . insulin glargine  10 Units Subcutaneous Daily  . multivitamin with minerals  1 tablet Oral Daily  . omega-3 acid ethyl esters  1 g Oral Daily  . pregabalin  150 mg Oral BID  . sacubitril-valsartan  1 tablet Oral BID  . topiramate  50 mg Oral BID  . traZODone  100 mg Oral QHS   Continuous Infusions:   LOS: 2 days    Time  spent: 28 minutes    Barb Merino, MD [facility] Pager [phone]

## 2021-02-10 NOTE — CV Procedure (Addendum)
INDICATIONS:   The patient is 60 years old white female with hypertension has  recent stroke.  PROCEDURE:  Informed consent was discussed including risks, benefits and alternatives for the procedure.  Risks include, but are not limited to, cough, sore throat, vomiting, nausea, somnolence, esophageal and stomach trauma or perforation, bleeding, low blood pressure, aspiration, pneumonia, infection, trauma to the teeth and death.    Patient was given sedation.  The oropharynx was anesthetized with topical lidocaine.  The transesophageal probe was inserted in the esophagus and stomach and multiple views were obtained.  Agitated saline was used after the transesophageal probe was removed from the body.  The patient was kept under observation until the patient left the procedure room.  The patient left the procedure room in stable condition.   COMPLICATIONS:  There were no immediate complications.  FINDINGS:  1. LEFT VENTRICLE: The left ventricle is normal in structure and has mild generalized hypokinesia with LV EF 40-45 %function.  No thrombus or masses seen in the left ventricle.  2. RIGHT VENTRICLE:  The right ventricle is normal in structure and function without any thrombus or masses.    3. LEFT ATRIUM:  The left atrium is normal without any thrombus or masses.  4. LEFT ATRIAL APPENDAGE:  The left atrial appendage is free of any thrombus or masses.  5. RIGHT ATRIUM:  The right atrium is free of any thrombus or masses.    6. ATRIAL SEPTUM:  The atrial septum is thickened with ASD or PFO by color doppler. Sonicated saline injection failed to show PFO.  7. MITRAL VALVE:  The mitral valve is normal in structure and function with moderate multijet regurgitation, It has small mobile vegetations.  8. TRICUSPID VALVE:  The tricuspid valve has trivial regurgitation. It has larger single sessile vegetation.on anterior leaflet.  9. AORTIC VALVE:  The aortic valve is normal in structure and function  without regurgitation, masses, stenosis or vegetations.  10. PULMONIC VALVE:  The pulmonic valve is normal in structure and function without significant regurgitation, masses, stenosis or vegetations.  11. AORTIC ARCH, ASCENDING AND DESCENDING AORTA:  The aorta had mild diffuse atherosclerosis in the ascending and descending aorta.  The aortic arch was normal.  12.  Superior Vena Cava : No thrombus or catheter.  13.  Pulmonary Veins: Visible.  14.  Pulmonary artery: visible and normal.   IMPRESSION:   1. Mild LV systolic dysfunction 2. Moderate MR. 3. Small mobile vegetations on both leaflets of MV. 4. Single larger non-mobile vegetation on TV. 5. ASD/PFO by color doppler.  RECOMMENDATIONS:    Blood cultures. Infectious disease consult. Referring doctor notified.Marland Kitchen

## 2021-02-10 NOTE — Progress Notes (Signed)
Pt refused tele. Night RN will continue to attempt, and to monitor.

## 2021-02-10 NOTE — Consult Note (Signed)
Ref: Donald Prose, MD   Subjective:  Here for TEE for stroke. VS stable. Mild sinus tachycardia. C/O back pain more so on right side.  Objective:  Vital Signs in the last 24 hours: Temp:  [97.4 F (36.3 C)-98.1 F (36.7 C)] 97.7 F (36.5 C) (03/15 0730) Pulse Rate:  [94-105] 103 (03/15 0730) Cardiac Rhythm: Sinus tachycardia (03/14 2038) Resp:  [15-23] 23 (03/15 0730) BP: (125-159)/(70-106) 154/106 (03/15 0730) SpO2:  [97 %-100 %] 98 % (03/15 0730)  Physical Exam: BP Readings from Last 1 Encounters:  02/10/21 (!) 154/106     Wt Readings from Last 1 Encounters:  02/07/21 82.6 kg    Weight change:  Body mass index is 30.29 kg/m. HEENT: [place]/AT, Eyes-Hazel, Conjunctiva-Pink, Sclera-Non-icteric Neck: No JVD, No bruit, Trachea midline. Lungs:  Clear, Bilateral. Cardiac:  Regular rhythm, normal S1 and S2, no S3. II/VI systolic murmur. Abdomen:  Soft, non-tender. BS present. Extremities:  No edema present. No cyanosis. No clubbing. CNS: AxOx3, Cranial nerves grossly intact. Left sided weakness. Skin: Warm and dry.   Intake/Output from previous day: 03/14 0701 - 03/15 0700 In: 1260 [P.O.:1260] Out: -     Lab Results: BMET    Component Value Date/Time   NA 139 02/07/2021 1145   NA 135 09/08/2020 1139   NA 139 02/16/2017 1004   NA 142 12/04/2011 2307   K 3.4 (L) 02/07/2021 1145   K 3.9 09/08/2020 1139   K 4.3 02/16/2017 1004   CL 105 02/07/2021 1145   CL 100 09/08/2020 1139   CL 101 02/16/2017 1004   CO2 25 02/07/2021 1145   CO2 24 09/08/2020 1139   CO2 23 02/16/2017 1004   GLUCOSE 298 (H) 02/07/2021 1145   GLUCOSE 388 (H) 09/08/2020 1139   GLUCOSE 399 (H) 02/16/2017 1004   GLUCOSE 156 (H) 12/04/2011 2307   BUN 18 02/07/2021 1145   BUN 16 09/08/2020 1139   BUN 12 02/16/2017 1004   BUN 14 12/04/2011 2307   CREATININE 0.53 02/07/2021 1145   CREATININE 0.72 09/08/2020 1139   CREATININE 0.64 02/16/2017 1004   CALCIUM 9.2 02/07/2021 1145   CALCIUM 9.2  09/08/2020 1139   CALCIUM 9.3 02/16/2017 1004   GFRNONAA >60 02/07/2021 1145   GFRNONAA >60 09/08/2020 1139   GFRNONAA 101 02/16/2017 1004   GFRAA 117 02/16/2017 1004   CBC    Component Value Date/Time   WBC 9.0 02/07/2021 1145   RBC 5.32 (H) 02/07/2021 1145   HGB 15.7 (H) 02/07/2021 1145   HGB 16.0 (H) 02/16/2017 1004   HCT 45.8 02/07/2021 1145   HCT 48.2 (H) 02/16/2017 1004   PLT 301 02/07/2021 1145   MCV 86.1 02/07/2021 1145   MCV 88 02/16/2017 1004   MCH 29.5 02/07/2021 1145   MCHC 34.3 02/07/2021 1145   RDW 12.6 02/07/2021 1145   RDW 13.8 02/16/2017 1004   LYMPHSABS 2.2 02/07/2021 1145   LYMPHSABS 1.6 02/16/2017 1004   MONOABS 0.7 02/07/2021 1145   EOSABS 0.1 02/07/2021 1145   EOSABS 0.1 02/16/2017 1004   BASOSABS 0.1 02/07/2021 1145   BASOSABS 0.0 02/16/2017 1004   HEPATIC Function Panel Recent Labs    09/08/20 1139 02/07/21 1145  PROT 7.5 6.8   HEMOGLOBIN A1C No components found for: HGA1C,  MPG CARDIAC ENZYMES Lab Results  Component Value Date   CKTOTAL 40 02/08/2021   BNP No results for input(s): PROBNP in the last 8760 hours. TSH No results for input(s): TSH in the last 8760 hours.  CHOLESTEROL Recent Labs    02/08/21 0747  CHOL 130    Scheduled Meds: . [MAR Hold] aspirin EC  81 mg Oral Daily  . [MAR Hold] atorvastatin  80 mg Oral Daily  . [MAR Hold] carvedilol  3.125 mg Oral BID WC  . [MAR Hold] clopidogrel  75 mg Oral Daily  . [MAR Hold] docusate sodium  100 mg Oral Daily  . [MAR Hold] enoxaparin (LOVENOX) injection  40 mg Subcutaneous Q24H  . [MAR Hold] insulin aspart  0-15 Units Subcutaneous TID WC  . [MAR Hold] insulin aspart  0-5 Units Subcutaneous QHS  . [MAR Hold] insulin aspart  4 Units Subcutaneous TID WC  . [MAR Hold] insulin glargine  10 Units Subcutaneous Daily  . [MAR Hold] multivitamin with minerals  1 tablet Oral Daily  . [MAR Hold] omega-3 acid ethyl esters  1 g Oral Daily  . [MAR Hold] pregabalin  150 mg Oral BID  . [MAR  Hold] sacubitril-valsartan  1 tablet Oral BID  . [MAR Hold] topiramate  50 mg Oral BID  . [MAR Hold] traZODone  100 mg Oral QHS   Continuous Infusions: . sodium chloride    . sodium chloride 500 mL (02/10/21 0735)   PRN Meds:.sodium chloride, [MAR Hold] acetaminophen **OR** [MAR Hold] acetaminophen (TYLENOL) oral liquid 160 mg/5 mL **OR** [MAR Hold] acetaminophen, [MAR Hold] EPINEPHrine, [MAR Hold] traMADol  Assessment/Plan: Right thalamo-capsular and left occipital infarcts H/O CVA 2017 HTN Type 2 DM HLD Anxiety Back pain  Plan: TEE today.   LOS: 2 days   Time spent including chart review, lab review, examination, discussion with patient : 30  min   Dixie Dials  MD  02/10/2021, 7:50 AM

## 2021-02-10 NOTE — Progress Notes (Signed)
Patient refuse tele monitor despite all education given to patient on the need to be on monitor. Patient stated that leads cause sweating

## 2021-02-10 NOTE — Progress Notes (Addendum)
Pt refusing tele. States that sweating at night leads to leads coming off, and it is uncomfortable. Staff have been educating throughout the day. Pt agrees to put on tele tonight. Will continue to monitor.

## 2021-02-10 NOTE — Consult Note (Signed)
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 Nidus of infection is unclear with remote history of UTI in November. Blood cultures drawn this morning and pending. Will check MRI of lumbar spine and hip to determine if any infection is present. Discussed that Sheila Guerrero will need a PICC line and 6 weeks of IV therapy. Start broad spectrum antibiotics. Will need PICC line once blood cultures are cleared.   PLAN:  1. Blood cultures ordered. 2. Start daptomycin and ceftriaxone after blood cultures. 3. Hold central line placement until after cultures are clear.  4. Continue stroke management per primary team.    Principal Problem:   Right thalamic infarction [facility]) Active Problems:   Type 2 diabetes mellitus with hyperlipidemia (HCC)   Paresthesia   Essential hypertension   Acute ischemic stroke (HCC)   Malnutrition of moderate degree   . aspirin EC  81 mg Oral Daily  . atorvastatin  80 mg Oral Daily  . carvedilol  3.125 mg Oral BID WC  . clopidogrel  75 mg Oral Daily  . docusate sodium  100 mg Oral Daily  . enoxaparin (LOVENOX) injection  40 mg Subcutaneous Q24H  . insulin aspart  0-15 Units Subcutaneous TID WC  . insulin aspart  0-5 Units Subcutaneous QHS  . insulin aspart  4 Units Subcutaneous TID WC  . insulin glargine  10 Units Subcutaneous Daily  . multivitamin with minerals  1 tablet Oral Daily  . omega-3 acid ethyl esters  1 g Oral Daily  . pregabalin  150 mg Oral BID  . sacubitril-valsartan  1 tablet Oral BID  . topiramate  50 mg Oral BID  . traZODone   100 mg Oral QHS     HPI: Sheila Guerrero is a 60 y.o. female with previous medical history significant for Type 2 diabetes, hypertension, and previous CVA (only effecting executive functions) admitted with 4 weeks of left-sided paresthesias with progressive weakness on the left side and new right sided sciatica.   Mr. Panning noted sudden onset left sided paesthesia about 4 weeks ago. Initially seen by primary care and prescribed Lyrica for neuropathy. Sheila Guerrero had minimal improvements and continued neuropathic pain. Referred to neurology on 3/7 with concern for right thalamic infarct. MRI brain on 3/12 with small acute to early subacute infarct in the right thalmus/posterior limb of the internal capsule and small subacute left occipital lobe infarct.  TTE performed on 3/13 with EF of 40-45% and no evidence of encarditis. Follow up TEE earlier today with sessile cauliflower shaped vegetation measuring 0.428 x 0.712 cm on  anterior leaflet mid portion of TV and mobile small MV vegetations seen on both leaflets.  Sheila Guerrero has been afebrile since admission and denies any fevers at home prior to arrival. Sheila Guerrero has been having unintended weight loss for several months as well as fatigue. During most recent trip to the beach Sheila Guerrero was very tired the entire time. Been having new onset sciatica on the right with no history of trauma or injury to her back  or side. This waxes and wanes. Referred pain goes down to her knee. Eager to go home as Sheila Guerrero has not been sleeping well which is increasing her levels of anxiety. Blood cultures obtained earlier today.   Review of Systems: Review of Systems  Constitutional: Positive for malaise/fatigue and weight loss. Negative for chills and fever.  Respiratory: Negative for cough, shortness of breath and wheezing.   Cardiovascular: Negative for chest pain and leg swelling.  Gastrointestinal: Negative for abdominal pain, constipation, diarrhea, nausea and vomiting.   Musculoskeletal:       Positive for right hip/sciatic pain  Skin: Negative for rash.  Psychiatric/Behavioral: The patient is nervous/anxious.      Past Medical History:  Diagnosis Date  . Angioedema   . Anxiety   . Asthma   . Depression   . Diabetes mellitus, type II ([place])   . Dyslipidemia   . Family history of adverse reaction to anesthesia    mother severely confused after general anesthesia  . History of CVA (cerebrovascular accident)   . HTN (hypertension) 2013  . Panic attacks   . Pneumonia   . Situational stress     Social History   Tobacco Use  . Smoking status: Never Smoker  . Smokeless tobacco: Never Used  Substance Use Topics  . Alcohol use: Yes    Comment: rarely  . Drug use: Never    Family History  Problem Relation Age of Onset  . Hypertension Mother   . Other Mother        blood disorder  . Hypertension Father   . Stroke Father   . Diabetes Maternal Aunt     Allergies  Allergen Reactions  . Sulfa Antibiotics Swelling  . Dulaglutide     Other reaction(s): rash  . Empagliflozin     Other reaction(s): recurrent yeast  . Iodinated Diagnostic Agents     Other reaction(s): angioedema with CT dye  . Latex Rash    OBJECTIVE: Blood pressure 138/89, pulse 95, temperature 97.8 F (36.6 C), temperature source Temporal, resp. rate (!) 27, height 5\' 5"  (1.651 m), weight 82.6 kg, last menstrual period 11/25/2011, SpO2 96 %.  Physical Exam Constitutional:      General: Sheila Guerrero is not in acute distress.    Appearance: Sheila Guerrero is well-developed.  Cardiovascular:     Rate and Rhythm: Normal rate and regular rhythm.     Heart sounds: Normal heart sounds. No murmur heard.   Pulmonary:     Effort: Pulmonary effort is normal.     Breath sounds: Normal breath sounds.  Abdominal:     General: Bowel sounds are normal.  Musculoskeletal:     Comments: Right hip with normal range of motion.   Skin:    General: Skin is warm and dry.  Neurological:     Mental  Status: Sheila Guerrero is alert and oriented to person, place, and time.  Psychiatric:        Mood and Affect: Mood is anxious.        Behavior: Behavior normal.        Thought Content: Thought content normal.        Judgment: Judgment normal.     Lab Results Lab Results  Component Value Date   WBC 9.8 02/10/2021   HGB 15.6 (H) 02/10/2021   HCT 47.2 (H) 02/10/2021   MCV 87.1 02/10/2021   PLT 262 02/10/2021    Lab Results  Component Value Date   CREATININE 0.53 02/07/2021   BUN 18  02/07/2021   NA 139 02/07/2021   K 3.4 (L) 02/07/2021   CL 105 02/07/2021   CO2 25 02/07/2021    Lab Results  Component Value Date   ALT 21 02/07/2021   AST 16 02/07/2021   ALKPHOS 71 02/07/2021   BILITOT 0.8 02/07/2021     Microbiology: Recent Results (from the past 240 hour(s))  Resp Panel by RT-PCR (Flu A&B, Covid) Nasopharyngeal Swab     Status: None   Collection Time: 02/07/21  3:00 PM   Specimen: Nasopharyngeal Swab; Nasopharyngeal(NP) swabs in vial transport medium  Result Value Ref Range Status   SARS Coronavirus 2 by RT PCR NEGATIVE NEGATIVE Final    Comment: (NOTE) SARS-CoV-2 target nucleic acids are NOT DETECTED.  The SARS-CoV-2 RNA is generally detectable in upper respiratory specimens during the acute phase of infection. The lowest concentration of SARS-CoV-2 viral copies this assay can detect is 138 copies/mL. A negative result does not preclude SARS-Cov-2 infection and should not be used as the sole basis for treatment or other patient management decisions. A negative result may occur with  improper specimen collection/handling, submission of specimen other than nasopharyngeal swab, presence of viral mutation(s) within the areas targeted by this assay, and inadequate number of viral copies(<138 copies/mL). A negative result must be combined with clinical observations, patient history, and epidemiological information. The expected result is Negative.  Fact Sheet for Patients:   EntrepreneurPulse.com.au  Fact Sheet for Healthcare Providers:  IncredibleEmployment.be  This test is no t yet approved or cleared by the [place] FDA and  has been authorized for detection and/or diagnosis of SARS-CoV-2 by FDA under an Emergency Use Authorization (EUA). This EUA will remain  in effect (meaning this test can be used) for the duration of the COVID-19 declaration under Section 564(b)(1) of the Act, 21 U.S.C.section 360bbb-3(b)(1), unless the authorization is terminated  or revoked sooner.       Influenza A by PCR NEGATIVE NEGATIVE Final   Influenza B by PCR NEGATIVE NEGATIVE Final    Comment: (NOTE) The Xpert Xpress SARS-CoV-2/FLU/RSV plus assay is intended as an aid in the diagnosis of influenza from Nasopharyngeal swab specimens and should not be used as a sole basis for treatment. Nasal washings and aspirates are unacceptable for Xpert Xpress SARS-CoV-2/FLU/RSV testing.  Fact Sheet for Patients: EntrepreneurPulse.com.au  Fact Sheet for Healthcare Providers: IncredibleEmployment.be  This test is not yet approved or cleared by the [place] FDA and has been authorized for detection and/or diagnosis of SARS-CoV-2 by FDA under an Emergency Use Authorization (EUA). This EUA will remain in effect (meaning this test can be used) for the duration of the COVID-19 declaration under Section 564(b)(1) of the Act, 21 U.S.C. section 360bbb-3(b)(1), unless the authorization is terminated or revoked.  Performed at [facility], [place] [address], [place], [place] [address]      Terri Piedra, [place]ious [facility]  02/10/2021  12:58 PM

## 2021-02-10 NOTE — Transfer of Care (Signed)
Immediate Anesthesia Transfer of Care Note  Patient: [name]  Procedure(s) Performed: TRANSESOPHAGEAL ECHOCARDIOGRAM (TEE) (N/A ) BUBBLE STUDY  Patient Location: PACU and Endoscopy Unit  Anesthesia Type:MAC  Level of Consciousness: awake and drowsy  Airway & Oxygen Therapy: Patient Spontanous Breathing  Post-op Assessment: Report given to RN and Post -op Vital signs reviewed and stable  Post vital signs: Reviewed and stable  Last Vitals:  Vitals Value Taken Time  BP 111/69 02/10/21 0831  Temp 36.6 C 02/10/21 0831  Pulse 88 02/10/21 0831  Resp 22 02/10/21 0831  SpO2 94 % 02/10/21 0831  Vitals shown include unvalidated device data.  Last Pain:  Vitals:   02/10/21 0831  TempSrc: Temporal  PainSc:       Patients Stated Pain Goal: 3 (60/04/59 9774)  Complications: No complications documented.

## 2021-02-10 NOTE — Anesthesia Postprocedure Evaluation (Signed)
Anesthesia Post Note  Patient: Pascale Maves  Procedure(s) Performed: TRANSESOPHAGEAL ECHOCARDIOGRAM (TEE) (N/A ) BUBBLE STUDY     Patient location during evaluation: Endoscopy Anesthesia Type: MAC Level of consciousness: awake and alert Pain management: pain level controlled Vital Signs Assessment: post-procedure vital signs reviewed and stable Respiratory status: spontaneous breathing, nonlabored ventilation, respiratory function stable and patient connected to nasal cannula oxygen Cardiovascular status: stable and blood pressure returned to baseline Postop Assessment: no apparent nausea or vomiting Anesthetic complications: no   No complications documented.  Last Vitals:  Vitals:   02/10/21 0839 02/10/21 0848  BP: 116/72 138/89  Pulse: 92 95  Resp: (!) 21 (!) 27  Temp:    SpO2: 95% 96%    Last Pain:  Vitals:   02/10/21 0848  TempSrc:   PainSc: 0-No pain                 Catalina Gravel

## 2021-02-10 NOTE — TOC Progression Note (Signed)
Transition of Care [facility]) - Progression Note    Patient Details  Name: Sheila Guerrero MRN: [id number] Date of Birth: March 04, 1961  [facility] of [facility]) CM/SW Contact  Pollie Friar, RN Phone Number: 02/10/2021, 3:55 PM  Clinical Narrative:    Patient with vegetation on heart valves. Plan is for IV abx and PICC line after BC results. CM met with the patient and she has no preference for [facility] RN agency or IV abx company. Pam with [employer] was consulted and accepted the referral. She will also arrange the [facility]. Pt will do outpatient therapy for PT/OT/ST. TOC following.   Expected Discharge Plan: OP Rehab Barriers to Discharge: Continued Medical Work up  Expected Discharge Plan and Services Expected Discharge Plan: OP Rehab   Discharge Planning Services: CM Consult   Living arrangements for the past 2 months: Single Family Home                           HH Arranged: RN   Date [place]: 02/10/21   Representative spoke with at [place]: [place] through [place]) Interventions    Readmission Risk Interventions No flowsheet data found.

## 2021-02-10 NOTE — Progress Notes (Signed)
Patient is refusing telemetry monitor. Told this nurse not to put the monitor on her. Doctor on call notified and central tele notified

## 2021-02-10 NOTE — Progress Notes (Signed)
Patient left for TEE

## 2021-02-11 ENCOUNTER — Inpatient Hospital Stay (HOSPITAL_COMMUNITY): Payer: 59

## 2021-02-11 DIAGNOSIS — E1169 Type 2 diabetes mellitus with other specified complication: Secondary | ICD-10-CM

## 2021-02-11 DIAGNOSIS — I76 Septic arterial embolism: Secondary | ICD-10-CM

## 2021-02-11 DIAGNOSIS — E785 Hyperlipidemia, unspecified: Secondary | ICD-10-CM

## 2021-02-11 LAB — GLUCOSE, CAPILLARY
Glucose-Capillary: 278 mg/dL — ABNORMAL HIGH (ref 70–99)
Glucose-Capillary: 328 mg/dL — ABNORMAL HIGH (ref 70–99)
Glucose-Capillary: 274 mg/dL — ABNORMAL HIGH (ref 70–99)
Glucose-Capillary: 338 mg/dL — ABNORMAL HIGH (ref 70–99)

## 2021-02-11 MED ORDER — CYCLOBENZAPRINE HCL 10 MG PO TABS
5.0000 mg | ORAL_TABLET | Freq: Three times a day (TID) | ORAL | Status: DC
  Administered 2021-02-11 – 2021-02-12 (×4): 5 mg via ORAL
  Filled 2021-02-11 (×4): qty 1

## 2021-02-11 MED ORDER — OXYCODONE HCL 5 MG PO TABS
5.0000 mg | ORAL_TABLET | Freq: Four times a day (QID) | ORAL | Status: DC | PRN
  Administered 2021-02-12: 5 mg via ORAL
  Filled 2021-02-11: qty 1

## 2021-02-11 MED ORDER — LIDOCAINE 5 % EX PTCH
1.0000 | MEDICATED_PATCH | CUTANEOUS | Status: DC
  Administered 2021-02-11 – 2021-02-12 (×2): 1 via TRANSDERMAL
  Filled 2021-02-11 (×2): qty 1

## 2021-02-11 NOTE — Progress Notes (Signed)
Patient refused vital signs check at midnight

## 2021-02-11 NOTE — Progress Notes (Signed)
PROGRESS NOTE    Sheila Guerrero  YIR:[id number] DOB: Jan 28, 1961 DOA: 02/07/2021 PCP: Donald Prose, MD    Brief Narrative:  60 year old female with history of hypertension, hyperlipidemia, type 2 diabetes on insulin, anxiety and depression, stroke in 2017 with no neurological deficits presented to the ER with 4 weeks of left-sided weakness and numbness.  She also has history of neuropathy.  Patient was having outpatient work-up, she was noted to have significant weakness on the left side so sent to ER.  MRI shows acute/subacute stroke. 3/15, underwent TEE and surprisingly found to have mitral valve and tricuspid valve endocarditis with no evidence of systemic infection.  Neurologically improving.  Patient has more of the right hip pain that is worrisome for her.  Actually, family tells me that they came to hospital because of significant right hip pain. Stroke work-up in progress.  Echocardiogram consistent with both MV and TV endocarditis.  Assessment & Plan:   Principal Problem:   Subacute endocarditis Active Problems:   Type 2 diabetes mellitus with hyperlipidemia (HCC)   Paresthesia   Right thalamic infarction [facility])   Essential hypertension   Acute ischemic stroke (HCC)   Malnutrition of moderate degree   Septic embolism (HCC)  Acute/subacute ischemic stroke right MCA: Left-sided sensory deficit with mild ataxia.Marland Kitchen MRI of the brain, small acute right thalamus/posterior limb of internal capsule infarct.  Left occipital lobe infarct. MRA of the head, Severe right M1, moderate right V4 stenosis.  No large vessel occlusion. 2D echocardiogram, EF 40 to 45% with global hypokinesis.  Moderate concentric LVH.  No source of embolism. Carotid duplex, 1 to 39% stenosis. LDL 55, on goal. A1c, 12.1.  Very high.  Doubt compliance.  Will discuss with patient about insulin at home. Intervention, dual antiplatelet therapy with aspirin and Plavix for 3 weeks due severe intracranial  atherosclerotic disease.  High-dose statin with Lipitor 80 mg daily. Seen by PT OT.  Recommended home health PT OT.  Continue mobilizing with nursing supervision.  Mitral valve/tricuspid valve vegetation: Accidental finding of both right and left-sided vegetations on TEE exam.  Patient with some chronic symptoms of weakness and fatigue but no fever, recent infection or interventions. MRI of the pelvis, including both side of the hip without any evidence of infection or septic arthritis. MRI of the lumbar spine with no evidence of infections. Dental x-ray pending today. Blood cultures 3/15 - so far.  Blood cultures 3/16 drawn. Followed by infectious disease.  Planning 6 weeks of antibiotic therapy.  Cardiomyopathy: Suspect hypertensive cardiomyopathy.  Lisinopril discontinued 3/13.  Is started on beta-blockers and Entresto.   Hypokalemia: Replaced.  Adequate.  Right hip pain /sciatica: On Lyrica.  Significant pain.  MRI negative.  Patient with significant pain.  Planning to go to a chiropractor.  Tramadol not helping. Will prescribe a short course of oxycodone, Flexeril for muscle relaxant, local lidocaine patch and heat pad.  Essential hypertension: Blood pressure stable.  Type 2 diabetes with hyperlipidemia: A1c is 12.  Holding Metformin.  Currently on insulin.  We will discuss about insulin at home.  Anxiety/depression: Stable.   DVT prophylaxis: enoxaparin (LOVENOX) injection 40 mg Start: 02/07/21 1830   Code Status: Full code Family Communication: Husband at the bedside. Disposition Plan: Status is: Inpatient  Remains inpatient appropriate because:Inpatient level of care appropriate due to severity of illness   Dispo: The patient is from: Home              Anticipated d/c is to: Home with home  health.  Probably will need home infusion therapy.              Patient currently is not medically stable to d/c.   Difficult to place patient No         Consultants:    Neurology  Cardiology  ID  Procedures:   None  Antimicrobials:   Daptomycin and ceftriaxone 2/15----   Subjective: Patient seen and examined.  Husband at the bedside.  Main complaint is inadequate pain medication for right hip.  Afebrile.  Eager to get out of the hospital but understands that we need to finish her endocarditis work-up. Discussed about MRI findings, reassured her about no major structural problems.  Objective: Vitals:   02/10/21 0848 02/10/21 2114 02/11/21 0402 02/11/21 0732  BP: 138/89 (!) 144/84 (!) 145/87 (!) 147/83  Pulse: 95 (!) 102 91 96  Resp: (!) 27 20 15 16   Temp:  97.6 F (36.4 C)  98.6 F (37 C)  TempSrc:  Oral  Oral  SpO2: 96% 96% 98% 98%  Weight:      Height:        Intake/Output Summary (Last 24 hours) at 02/11/2021 1126 Last data filed at 02/10/2021 2352 Gross per 24 hour  Intake 342.18 ml  Output -  Net 342.18 ml   Filed Weights   02/07/21 1047  Weight: 82.6 kg    Examination:  General: Walking around the room.  Looks comfortable.  Slightly anxious due to ongoing pain issue. Cardiovascular: S1-S2 normal.  Regular. Respiratory: Bilateral clear.  No added sounds Gastrointestinal: Bowel sounds present.  Soft and nontender. Ext: No cyanosis or edema.  No joint swelling. Neuro: Cranial nerves normal.  She has slight ataxia on the left side, no obvious motor deficits.     Data Reviewed: I have personally reviewed following labs and imaging studies  CBC: Recent Labs  Lab 02/07/21 1145 02/10/21 0939  WBC 9.0 9.8  NEUTROABS 6.0 6.4  HGB 15.7* 15.6*  HCT 45.8 47.2*  MCV 86.1 87.1  PLT 301 233   Basic Metabolic Panel: Recent Labs  Lab 02/07/21 1145  NA 139  K 3.4*  CL 105  CO2 25  GLUCOSE 298*  BUN 18  CREATININE 0.53  CALCIUM 9.2   GFR: Estimated Creatinine Clearance: 81.3 mL/min (by C-G formula based on SCr of 0.53 mg/dL). Liver Function Tests: Recent Labs  Lab 02/07/21 1145  AST 16  ALT 21  ALKPHOS  71  BILITOT 0.8  PROT 6.8  ALBUMIN 3.8   No results for input(s): LIPASE, AMYLASE in the last 168 hours. No results for input(s): AMMONIA in the last 168 hours. Coagulation Profile: Recent Labs  Lab 02/07/21 1145  INR 1.0   Cardiac Enzymes: Recent Labs  Lab 02/08/21 0742  CKTOTAL 40   BNP (last 3 results) No results for input(s): PROBNP in the last 8760 hours. HbA1C: Recent Labs    02/09/21 1523  HGBA1C 12.1*   CBG: Recent Labs  Lab 02/10/21 0625 02/10/21 1329 02/10/21 1911 02/10/21 2106 02/11/21 0618  GLUCAP 215* 383* 286* 308* 278*   Lipid Profile: No results for input(s): CHOL, HDL, LDLCALC, TRIG, CHOLHDL, LDLDIRECT in the last 72 hours. Thyroid Function Tests: No results for input(s): TSH, T4TOTAL, FREET4, T3FREE, THYROIDAB in the last 72 hours. Anemia Panel: No results for input(s): VITAMINB12, FOLATE, FERRITIN, TIBC, IRON, RETICCTPCT in the last 72 hours. Sepsis Labs: No results for input(s): PROCALCITON, LATICACIDVEN in the last 168 hours.  Recent Results (from the  past 240 hour(s))  Resp Panel by RT-PCR (Flu A&B, Covid) Nasopharyngeal Swab     Status: None   Collection Time: 02/07/21  3:00 PM   Specimen: Nasopharyngeal Swab; Nasopharyngeal(NP) swabs in vial transport medium  Result Value Ref Range Status   SARS Coronavirus 2 by RT PCR NEGATIVE NEGATIVE Final    Comment: (NOTE) SARS-CoV-2 target nucleic acids are NOT DETECTED.  The SARS-CoV-2 RNA is generally detectable in upper respiratory specimens during the acute phase of infection. The lowest concentration of SARS-CoV-2 viral copies this assay can detect is 138 copies/mL. A negative result does not preclude SARS-Cov-2 infection and should not be used as the sole basis for treatment or other patient management decisions. A negative result may occur with  improper specimen collection/handling, submission of specimen other than nasopharyngeal swab, presence of viral mutation(s) within the areas  targeted by this assay, and inadequate number of viral copies(<138 copies/mL). A negative result must be combined with clinical observations, patient history, and epidemiological information. The expected result is Negative.  Fact Sheet for Patients:  EntrepreneurPulse.com.au  Fact Sheet for Healthcare Providers:  IncredibleEmployment.be  This test is no t yet approved or cleared by the [place] FDA and  has been authorized for detection and/or diagnosis of SARS-CoV-2 by FDA under an Emergency Use Authorization (EUA). This EUA will remain  in effect (meaning this test can be used) for the duration of the COVID-19 declaration under Section 564(b)(1) of the Act, 21 U.S.C.section 360bbb-3(b)(1), unless the authorization is terminated  or revoked sooner.       Influenza A by PCR NEGATIVE NEGATIVE Final   Influenza B by PCR NEGATIVE NEGATIVE Final    Comment: (NOTE) The Xpert Xpress SARS-CoV-2/FLU/RSV plus assay is intended as an aid in the diagnosis of influenza from Nasopharyngeal swab specimens and should not be used as a sole basis for treatment. Nasal washings and aspirates are unacceptable for Xpert Xpress SARS-CoV-2/FLU/RSV testing.  Fact Sheet for Patients: EntrepreneurPulse.com.au  Fact Sheet for Healthcare Providers: IncredibleEmployment.be  This test is not yet approved or cleared by the [place] FDA and has been authorized for detection and/or diagnosis of SARS-CoV-2 by FDA under an Emergency Use Authorization (EUA). This EUA will remain in effect (meaning this test can be used) for the duration of the COVID-19 declaration under Section 564(b)(1) of the Act, 21 U.S.C. section 360bbb-3(b)(1), unless the authorization is terminated or revoked.  Performed at [facility], [place] [address], [place], [place] [address]   Culture, blood (Routine X 2) w Reflex to ID Panel      Status: None (Preliminary result)   Collection Time: 02/10/21  9:39 AM   Specimen: BLOOD RIGHT FOREARM  Result Value Ref Range Status   Specimen Description BLOOD RIGHT FOREARM  Final   Special Requests   Final    BOTTLES DRAWN AEROBIC ONLY Blood Culture results may not be optimal due to an inadequate volume of blood received in culture bottles   Culture   Final    NO GROWTH < 24 HOURS Performed at [facility], [place] [address], [place], [place] [address]    Report Status PENDING  Incomplete  Culture, blood (routine x 2)     Status: None (Preliminary result)   Collection Time: 02/10/21  1:50 PM   Specimen: Peripheral; Blood  Result Value Ref Range Status   Specimen Description Peripheral  Final   Special Requests NONE  Final   Culture   Final  NO GROWTH < 24 HOURS Performed at [place] [address] [place], [place] [address]    Report Status PENDING  Incomplete  Culture, blood (routine x 2)     Status: None (Preliminary result)   Collection Time: 02/10/21  1:56 PM   Specimen: BLOOD  Result Value Ref Range Status   Specimen Description BLOOD RIGHT ANTECUBITAL  Final   Special Requests   Final    BOTTLES DRAWN AEROBIC AND ANAEROBIC Blood Culture adequate volume   Culture   Final    NO GROWTH < 24 HOURS Performed at [facility], [place] [address] [place], [place] [address]    Report Status PENDING  Incomplete         Radiology Studies: MR BRAIN W CONTRAST  Result Date: 02/10/2021 CLINICAL DATA:  Initial evaluation for neuro deficit, stroke suspected. EXAM: MRI HEAD WITH CONTRAST TECHNIQUE: Multiplanar, multiecho pulse sequences of the brain and surrounding structures were obtained with intravenous contrast. CONTRAST:  9mL GADAVIST GADOBUTROL 1 MMOL/ML IV SOLN COMPARISON:  Comparison made with recent noncontrast brain MRI from 02/07/2021. FINDINGS: Brain: Age-related cerebral atrophy with moderately advanced chronic microvascular ischemic disease  again noted. Few scattered remote lacunar infarcts noted as well. Findings seen and characterized on recent brain MRI from 02/07/2021. Following contrast administration, there is mild patchy post-contrast enhancement about the recently identified right thalamic capsular infarct (series 7, image 30). Additional subtle patchy enhancement about the previously identified left occipital infarct as well (series 9, image 17). Findings in keeping with probable early subacute ischemic infarcts. No other abnormal or pathologic enhancement within the brain. No other mass lesion, midline shift, or mass effect. No hydrocephalus or extra-axial fluid collection. No other new or progressive finding on this limited exam. Vascular: Normal intravascular enhancement seen throughout the major intracranial vascular structures. Skull and upper cervical spine: Craniocervical junction within normal limits. Bone marrow signal intensity normal. No scalp soft tissue abnormality. Sinuses/Orbits: Globes and orbital soft tissues within normal limits. Paranasal sinuses are largely clear. No mastoid effusion. Other: None. IMPRESSION: 1. Patchy post-contrast enhancement about the recently identified right thalamocapsular and left occipital infarcts, in keeping with early subacute ischemic infarcts. No associated mass effect. 2. Underlying age-related cerebral atrophy with moderately advanced chronic microvascular ischemic disease. No other new intracranial abnormality on this limited postcontrast only exam. Electronically Signed   By: Jeannine Boga M.D.   On: 02/10/2021 00:02   MR LUMBAR SPINE WO CONTRAST  Result Date: 02/10/2021 CLINICAL DATA:  Vigil take a shin seen on cardiac echo. New onset back pain. Question epidural abscess. EXAM: MRI LUMBAR SPINE WITHOUT CONTRAST TECHNIQUE: Multiplanar, multisequence MR imaging of the lumbar spine was performed. No intravenous contrast was administered. COMPARISON:  None. FINDINGS: Segmentation:   5 lumbar type vertebral bodies. Alignment: Scoliotic curvature convex to the left with the apex at L3. Vertebrae: No evidence of fracture. No sign of bone or joint infection. No sign of infectious discitis. Benign appearing fat within the L1 vertebral body. This study was ordered and performed without contrast. Conus medullaris and cauda equina: Conus extends to the L1 level. Conus and cauda equina appear normal. I do not see any finding to suggest epidural infection. Contrast administration would increase sensitivity to early disease. Paraspinal and other soft tissues: Negative Disc levels: T12-L1 and L1-2: Normal L2-3: Mild bulging of the disc.  No compressive stenosis. L3-4: Moderate bulging of the disc. Mild narrowing of both lateral recesses. No compressive stenosis. L4-5: Mild bulging  of the disc.  No compressive stenosis. L5-S1: Endplate osteophytes and shallow left posterolateral disc herniation. Facet and ligamentous hypertrophy. Narrowing of the subarticular lateral recess on the left that could possibly affect the left S1 nerve. IMPRESSION: 1. No sign of epidural infection or other spinal infection in the lumbar region. This study was ordered without contrast. Contrast administration would increase sensitivity to early disease. 2. Lumbar degenerative changes as outlined above. Left posterolateral disc herniation at L5-S1 with narrowing of the subarticular lateral recess on the left that could possibly affect the left S1 nerve. Electronically Signed   By: Nelson Chimes M.D.   On: 02/10/2021 13:05   MR HIP LEFT WO CONTRAST  Result Date: 02/10/2021 CLINICAL DATA:  Left hip pain. Heart valve vegetation. Concern for joint infection EXAM: MR OF THE LEFT HIP WITHOUT CONTRAST TECHNIQUE: Multiplanar, multisequence MR imaging was performed. No intravenous contrast was administered. COMPARISON:  09/08/2020 FINDINGS: Bones: No acute fracture. No dislocation. No femoral head avascular necrosis. No bony erosion or  periostitis. Bony pelvis intact. SI joints and pubic symphysis within normal limits. Mild right hip osteoarthritis with associated labral degeneration. No right hip joint effusion. Degenerative disc disease of L5-S1. Articular cartilage and labrum Articular cartilage: Mild chondral thinning and surface irregularity without focal defect. Labrum: Superior labral degeneration and tearing. 5 mm paralabral cyst along the anteroinferior margin of the labrum. Joint or bursal effusion Joint effusion:  None. Bursae: No bursal fluid collections. Muscles and tendons Muscles and tendons: Mild tendinosis of the bilateral hamstring tendon origins. The gluteal, iliopsoas, rectus femoris, and adductor tendons appear intact without tear or significant tendinosis. Normal muscle bulk and signal intensity without edema, atrophy, or fatty infiltration. Other findings Miscellaneous: No soft tissue edema or fluid collection. No inguinal lymphadenopathy. No acute findings within the pelvis. IMPRESSION: 1. No acute findings. Specifically, no evidence of septic arthritis or osteomyelitis. 2. Mild bilateral hip osteoarthritis with superior labral degeneration. 3. Mild bilateral hamstring tendinosis. Electronically Signed   By: Davina Poke D.O.   On: 02/10/2021 13:14   ECHO TEE  Result Date: 02/10/2021    TRANSESOPHOGEAL ECHO REPORT   Patient Name:   Sheila Guerrero Date of Exam: 02/10/2021 Medical Rec #:  [id number]          Height:       65.0 in Accession #:    [id number]         Weight:       182.0 lb Date of Birth:  1961-04-25          BSA:          1.900 m Patient Age:    58 years           BP:           171/108 mmHg Patient Gender: F                  HR:           105 bpm. Exam Location:  Inpatient Procedure: Transesophageal Echo Indications:     Stroke  History:         Patient has prior history of Echocardiogram examinations, most                  recent 02/08/2021. Risk Factors:Hypertension. Acute CVA.  Sonographer:      Clayton Lefort RDCS (AE) Referring Phys:  [place] Diagnosing Phys: Dixie Dials MD PROCEDURE: After discussion of the risks and benefits of a TEE, an  informed consent was obtained from the patient. The transesophogeal probe was passed without difficulty through the esophogus of the patient. Sedation performed by different physician. The patient was monitored while under deep sedation. Anesthestetic sedation was provided intravenously by Anesthesiology: 222.49mg  of Propofol, 60mg  of Lidocaine. Image quality was good. The patient developed no complications during the procedure. IMPRESSIONS  1. Left ventricular ejection fraction, by estimation, is 45 to 50%. The left ventricle has mildly decreased function. The left ventricle demonstrates global hypokinesis. Left ventricular diastolic function could not be evaluated.  2. Right ventricular systolic function is normal. The right ventricular size is normal.  3. Left atrial size was moderately dilated. No left atrial/left atrial appendage thrombus was detected.  4. Right atrial size was mildly dilated.  5. The pericardial effusion is posterior to the left ventricle. There is no evidence of cardiac tamponade.  6. Mobile small MV vegetations seen on both leaflets. The mitral valve is normal in structure. Moderate mitral valve regurgitation.  7. Sessile cauliflower shaped vegetation measuring 0.428 x 0.712 cm on anterior leaflet mid portion of TV.  8. The aortic valve is tricuspid. Aortic valve regurgitation is not visualized. No aortic stenosis is present.  9. There is mild (Grade II) atheroma plaque involving the descending aorta and ascending aorta. 10. The inferior vena cava is normal in size with greater than 50% respiratory variability, suggesting right atrial pressure of 3 mmHg. 11. Evidence of atrial level shunting detected by color flow Doppler. Agitated saline contrast bubble study was negative, with no evidence of any interatrial shunt. There is a small  patent foramen ovale with predominantly right to left shunting across the atrial septum. FINDINGS  Left Ventricle: Left ventricular ejection fraction, by estimation, is 45 to 50%. The left ventricle has mildly decreased function. The left ventricle demonstrates global hypokinesis. The left ventricular internal cavity size was normal in size. There is  borderline concentric left ventricular hypertrophy. Left ventricular diastolic function could not be evaluated. Right Ventricle: The right ventricular size is normal. No increase in right ventricular wall thickness. Right ventricular systolic function is normal. Left Atrium: Left atrial size was moderately dilated. No left atrial/left atrial appendage thrombus was detected. Right Atrium: Right atrial size was mildly dilated. Pericardium: Trivial pericardial effusion is present. The pericardial effusion is posterior to the left ventricle. There is no evidence of cardiac tamponade. Mitral Valve: Mobile small MV vegetations seen on both leaflets. The mitral valve is normal in structure. Normal mobility of the mitral valve leaflets. Moderate mitral valve regurgitation, with centrally-directed jet. Tricuspid Valve: Sessile cauliflower shaped vegetation measuring 0.428 x 0.712 cm on anterior leaflet mid portion of TV. The tricuspid valve is normal in structure. Tricuspid valve regurgitation is mild . No evidence of tricuspid stenosis. Aortic Valve: The aortic valve is tricuspid. Aortic valve regurgitation is not visualized. No aortic stenosis is present. Aortic valve mean gradient measures 3.0 mmHg. Aortic valve peak gradient measures 4.8 mmHg. Pulmonic Valve: The pulmonic valve was normal in structure. Pulmonic valve regurgitation is trivial. Aorta: The aortic root is normal in size and structure. There is mild (Grade II) atheroma plaque involving the descending aorta and ascending aorta. Venous: The left lower pulmonary vein and right lower pulmonary vein are normal. The  inferior vena cava is normal in size with greater than 50% respiratory variability, suggesting right atrial pressure of 3 mmHg. IAS/Shunts: The interatrial septum appears to be lipomatous. Evidence of atrial level shunting detected by color flow Doppler. Agitated saline contrast was  given intravenously to evaluate for intracardiac shunting. Agitated saline contrast bubble study was negative, with no evidence of any interatrial shunt. A small patent foramen ovale is detected with predominantly right to left shunting across the atrial septum.  AORTIC VALVE AV Vmax:      110.00 cm/s AV Vmean:     78.800 cm/s AV VTI:       0.176 m AV Peak Grad: 4.8 mmHg AV Mean Grad: 3.0 mmHg Dixie Dials MD Electronically signed by Dixie Dials MD Signature Date/Time: 02/10/2021/11:02:32 AM    Final    VAS US CAROTID  Result Date: 02/10/2021 Carotid Arterial Duplex Study Indications:       CVA. Risk Factors:      Hypertension, hyperlipidemia, Diabetes, prior CVA. Comparison Study:  03-28-2017 Prior carotid duplex showed 1-39% stenosis of the                    ICA bilaterally. Performing Technologist: Darlin Coco RDMS,RVT  Examination Guidelines: A complete evaluation includes B-mode imaging, spectral Doppler, color Doppler, and power Doppler as needed of all accessible portions of each vessel. Bilateral testing is considered an integral part of a complete examination. Limited examinations for reoccurring indications may be performed as noted.  Right Carotid Findings: +----------+--------+--------+--------+------------------+--------+           PSV cm/sEDV cm/sStenosisPlaque DescriptionComments +----------+--------+--------+--------+------------------+--------+ CCA Prox  61      10                                         +----------+--------+--------+--------+------------------+--------+ CCA Distal67      13                                          +----------+--------+--------+--------+------------------+--------+ ICA Prox  52      23      1-39%   heterogenous               +----------+--------+--------+--------+------------------+--------+ ICA Distal72      30                                         +----------+--------+--------+--------+------------------+--------+ ECA       69      8                                          +----------+--------+--------+--------+------------------+--------+ +----------+--------+-------+----------------+-------------------+           PSV cm/sEDV cmsDescribe        Arm Pressure (mmHG) +----------+--------+-------+----------------+-------------------+ Subclavian119            Multiphasic, WNL                    +----------+--------+-------+----------------+-------------------+ +---------+--------+--+--------+--+---------+ VertebralPSV cm/s43EDV cm/s11Antegrade +---------+--------+--+--------+--+---------+  Left Carotid Findings: +----------+--------+--------+--------+------------------+--------+           PSV cm/sEDV cm/sStenosisPlaque DescriptionComments +----------+--------+--------+--------+------------------+--------+ CCA Prox  97      17                                         +----------+--------+--------+--------+------------------+--------+  CCA Distal65      16                                         +----------+--------+--------+--------+------------------+--------+ ICA Prox  54      27      1-39%   heterogenous               +----------+--------+--------+--------+------------------+--------+ ICA Distal77      37                                         +----------+--------+--------+--------+------------------+--------+ ECA       91      9                                          +----------+--------+--------+--------+------------------+--------+ +----------+--------+--------+----------------+-------------------+           PSV cm/sEDV  cm/sDescribe        Arm Pressure (mmHG) +----------+--------+--------+----------------+-------------------+ Subclavian129             Multiphasic, WNL                    +----------+--------+--------+----------------+-------------------+ +---------+--------+--+--------+--+---------+ VertebralPSV cm/s53EDV cm/s23Antegrade +---------+--------+--+--------+--+---------+   Summary: Right Carotid: Velocities in the right ICA are consistent with a 1-39% stenosis. Left Carotid: Velocities in the left ICA are consistent with a 1-39% stenosis. Vertebrals:  Bilateral vertebral arteries demonstrate antegrade flow. Subclavians: Normal flow hemodynamics were seen in bilateral subclavian              arteries. *See table(s) above for measurements and observations.  Electronically signed by Antony Contras MD on 02/10/2021 at 12:50:53 PM.    Final         Scheduled Meds: . aspirin EC  81 mg Oral Daily  . atorvastatin  80 mg Oral Daily  . carvedilol  3.125 mg Oral BID WC  . clopidogrel  75 mg Oral Daily  . cyclobenzaprine  5 mg Oral TID  . docusate sodium  100 mg Oral Daily  . enoxaparin (LOVENOX) injection  40 mg Subcutaneous Q24H  . insulin aspart  0-15 Units Subcutaneous TID WC  . insulin aspart  0-5 Units Subcutaneous QHS  . insulin aspart  4 Units Subcutaneous TID WC  . insulin glargine  10 Units Subcutaneous Daily  . lidocaine  1 patch Transdermal Q24H  . multivitamin with minerals  1 tablet Oral Daily  . omega-3 acid ethyl esters  1 g Oral Daily  . pregabalin  150 mg Oral BID  . sacubitril-valsartan  1 tablet Oral BID  . topiramate  50 mg Oral BID  . traZODone  100 mg Oral QHS   Continuous Infusions: . cefTRIAXone (ROCEPHIN)  IV 2 g (02/11/21 0957)  . DAPTOmycin (CUBICIN)  IV Stopped (02/10/21 2144)     LOS: 3 days    Time spent: 30 minutes    Barb Merino, MD [facility] Pager [phone]

## 2021-02-11 NOTE — Progress Notes (Signed)
Inpatient Diabetes Program Recommendations  AACE/ADA: New Consensus Statement on Inpatient Glycemic Control (2015)  Target Ranges:  Prepandial:   less than 140 mg/dL      Peak postprandial:   less than 180 mg/dL (1-2 hours)      Critically ill patients:  140 - 180 mg/dL   Lab Results  Component Value Date   GLUCAP 278 (H) 02/11/2021   HGBA1C 12.1 (H) 02/09/2021    Review of Glycemic Control Results for Sheila Guerrero, Sheila "[name]" (MRN [id number]) as of 02/10/2021 09:43  Ref. Range 02/09/2021 10:29 02/09/2021 13:09 02/09/2021 16:31 02/09/2021 22:28 02/10/2021 06:25  Glucose-Capillary Latest Ref Range: 70 - 99 mg/dL 390 (H) 127 (H) 267 (H) 200 (H) 215 (H)   Diabetes history: DM 2 Outpatient Diabetes medications: Metformin 2000 mg Qevening at supper Current orders for Inpatient glycemic control:  Lantus 10 units Novolog 0-15 units tid + hs  Novolog 4 units tid meal coverage  A1c 12.1% this admission  Inpatient Diabetes Program Recommendations:    - Increase Lantus to 16-18 units (0.1 units/kg)  Spoke with pt at bedside on 3/14 see note.    D/C: Levemir preferred by insurance Insulin pen needles order # [phone] Continue home metformin  Thanks,  Tama Headings RN, MSN, BC-ADM Inpatient Diabetes Coordinator Team Pager [phone] (8a-5p)

## 2021-02-11 NOTE — Progress Notes (Signed)
Occupational Therapy Treatment Patient Details Name: Sheila Guerrero MRN: [id number] DOB: 1961-02-20 Today's Date: 02/11/2021    History of present illness Pt adm to [facility] 3/12 with several week history of paresthesias, pain, and progressive weakness on lt side. Pt seen by Dr. Krista Blue, outpatient neurology, on 3/7 with concern for rt thalamic infarct but unable to have MRI scheduled until 3/15. Pt developed right lower extremity sciatica. MRI showed small acute to early subacute infarct in the right  thalamus/posterior limb of internal capsule. and small subacute left occipital lobe infarct. Pt transferred to [facility] on 12/13. PMH -angioedema, htn, DM, anxiety, depression, CVA in 2017.    After stroke in 2017 pt reports that she gets lost easily in well known areas and difficulty finding objects as well difficulty with time and date.   OT comments  Pt making good progress towards OT goals this session. Session focus on BUE therex to increase coordination and AROM for higher level BADLs and functional mobility tasks. Pt completed therex as indicated below with no reports of increased pain, added coins to pts theraputty to facilitate increased [place]. Pt ambulating around room with no AD with no LOB noted. Pt would continue to benefit from skilled occupational therapy while admitted and after d/c to address the below listed limitations in order to improve overall functional mobility and facilitate independence with BADL participation. DC plan remains appropriate, will follow acutely per POC.     Follow Up Recommendations  Outpatient OT    Equipment Recommendations  None recommended by OT    Recommendations for Other Services      Precautions / Restrictions Precautions Precautions: Fall Restrictions Weight Bearing Restrictions: No       Mobility Bed Mobility Overal bed mobility: Modified Independent             General bed mobility comments: Up in room on arrival    Transfers Overall  transfer level: Modified independent Equipment used: None Transfers: Sit to/from Stand Sit to Stand: Modified independent (Device/Increase time)         General transfer comment: no physical asssit needed    Balance Overall balance assessment: Needs assistance Sitting-balance support: No upper extremity supported;Feet supported Sitting balance-Leahy Scale: Good     Standing balance support: No upper extremity supported;During functional activity Standing balance-Leahy Scale: Good Standing balance comment: Good balance with pt focused on task but deteriorates with pt distracted. Lateral LOB with head turns during ambulation                           ADL either performed or assessed with clinical judgement   ADL Overall ADL's : Modified independent                                       General ADL Comments: pt ambulating around room with no AD and supervision, session focus on BUE Therex and [facility] therex     Vision   Additional Comments: pt reports slight peripheral visual deficits   Perception     Praxis      Cognition Arousal/Alertness: Awake/alert Behavior During Therapy: WFL for tasks assessed/performed Overall Cognitive Status: Within Functional Limits for tasks assessed                                 General Comments:  hx of executive functioning difficulties since CVA in 2017, compensates well        Exercises Exercises: Other exercises General Exercises - Upper Extremity Shoulder Flexion: AROM;Left;10 reps;Standing;Theraband Theraband Level (Shoulder Flexion): Level 2 (Red) Other Exercises Other Exercises: Toe/Heel raises with one UE support x 10 Other Exercises: Standing marching x 10 Other Exercises: Standing B hip flexion with one UE support x 10 Other Exercises: issued pt coins to put into theraputty to facilitate [facility] with pt demo'ing good carryover and technique Other Exercises: 10 reps L shoulder flexion,  diagonals and punches with shoulder retraction, with level 2 theraband   Shoulder Instructions       General Comments      Pertinent Vitals/ Pain       Pain Assessment: No/denies pain  Home Living                                          Prior Functioning/Environment              Frequency  Min 2X/week        Progress Toward Goals  OT Goals(current goals can now be found in the care plan section)  Progress towards OT goals: Progressing toward goals  Acute Rehab OT Goals Patient Stated Goal: Regain IND OT Goal Formulation: With patient  Plan Frequency remains appropriate;Discharge plan remains appropriate    Co-evaluation                 AM-PAC OT "6 Clicks" Daily Activity     Outcome Measure   Help from another person eating meals?: None Help from another person taking care of personal grooming?: None Help from another person toileting, which includes using toliet, bedpan, or urinal?: None Help from another person bathing (including washing, rinsing, drying)?: None Help from another person to put on and taking off regular upper body clothing?: None Help from another person to put on and taking off regular lower body clothing?: None 6 Click Score: 24    End of Session    OT Visit Diagnosis: Unsteadiness on feet (R26.81);Other symptoms and signs involving cognitive function;Other symptoms and signs involving the nervous system (R29.898)   Activity Tolerance Patient tolerated treatment well   Patient Left in bed;with call bell/phone within reach;with family/visitor present;Other (comment) (sitting EOB)   Nurse Communication Mobility status        Time: 9169-4503 OT Time Calculation (min): 15 min  Charges: OT General Charges $OT Visit: 1 Visit OT Treatments $Therapeutic Exercise: 8-22 mins  Harley Alto., COTA/L Acute Rehabilitation Services [phone] [place] 02/11/2021, 4:33 PM

## 2021-02-11 NOTE — Progress Notes (Addendum)
Subjective:  Patient still unhappy about the floor she is on particular with the evening staff.   Antibiotics:  Anti-infectives (From admission, onward)   Start     Dose/Rate Route Frequency Ordered Stop   02/10/21 2200  cefTRIAXone (ROCEPHIN) 2 g in sodium chloride 0.9 % 100 mL IVPB        2 g 200 mL/hr over 30 Minutes Intravenous Every 12 hours 02/10/21 1616     02/10/21 2000  DAPTOmycin (CUBICIN) 800 mg in sodium chloride 0.9 % IVPB        800 mg 232 mL/hr over 30 Minutes Intravenous Daily 02/10/21 1616        Medications: Scheduled Meds: . aspirin EC  81 mg Oral Daily  . atorvastatin  80 mg Oral Daily  . carvedilol  3.125 mg Oral BID WC  . clopidogrel  75 mg Oral Daily  . cyclobenzaprine  5 mg Oral TID  . docusate sodium  100 mg Oral Daily  . enoxaparin (LOVENOX) injection  40 mg Subcutaneous Q24H  . insulin aspart  0-15 Units Subcutaneous TID WC  . insulin aspart  0-5 Units Subcutaneous QHS  . insulin aspart  4 Units Subcutaneous TID WC  . insulin glargine  10 Units Subcutaneous Daily  . lidocaine  1 patch Transdermal Q24H  . multivitamin with minerals  1 tablet Oral Daily  . omega-3 acid ethyl esters  1 g Oral Daily  . pregabalin  150 mg Oral BID  . sacubitril-valsartan  1 tablet Oral BID  . topiramate  50 mg Oral BID  . traZODone  100 mg Oral QHS   Continuous Infusions: . cefTRIAXone (ROCEPHIN)  IV 2 g (02/11/21 0957)  . DAPTOmycin (CUBICIN)  IV Stopped (02/10/21 2144)   PRN Meds:.acetaminophen **OR** acetaminophen (TYLENOL) oral liquid 160 mg/5 mL **OR** acetaminophen, EPINEPHrine, morphine injection, oxyCODONE    Objective: Weight change:   Intake/Output Summary (Last 24 hours) at 02/11/2021 1105 Last data filed at 02/10/2021 2352 Gross per 24 hour  Intake 342.18 ml  Output --  Net 342.18 ml   Blood pressure (!) 147/83, pulse 96, temperature 98.6 F (37 C), temperature source Oral, resp. rate 16, height 5\' 5"  (1.651 m), weight 82.6 kg,  last menstrual period 11/25/2011, SpO2 98 %. Temp:  [97.6 F (36.4 C)-98.6 F (37 C)] 98.6 F (37 C) (03/16 0732) Pulse Rate:  [91-102] 96 (03/16 0732) Resp:  [15-20] 16 (03/16 0732) BP: (144-147)/(83-87) 147/83 (03/16 0732) SpO2:  [96 %-98 %] 98 % (03/16 0732)  Physical Exam: Physical Exam Constitutional:      General: She is not in acute distress.    Appearance: She is well-developed. She is not diaphoretic.  HENT:     Head: Normocephalic and atraumatic.     Right Ear: External ear normal.     Left Ear: External ear normal.     Mouth/Throat:     Pharynx: No oropharyngeal exudate.  Eyes:     General: No scleral icterus.    Conjunctiva/sclera: Conjunctivae normal.     Pupils: Pupils are equal, round, and reactive to light.  Cardiovascular:     Rate and Rhythm: Normal rate and regular rhythm.     Heart sounds: Normal heart sounds. No murmur heard. No friction rub. No gallop.   Pulmonary:     Effort: Pulmonary effort is normal. No respiratory distress.     Breath sounds: Normal breath sounds. No wheezing or rales.  Abdominal:  General: Bowel sounds are normal. There is no distension.     Palpations: Abdomen is soft.     Tenderness: There is no abdominal tenderness. There is no rebound.  Musculoskeletal:        General: No tenderness. Normal range of motion.  Lymphadenopathy:     Cervical: No cervical adenopathy.  Skin:    General: Skin is warm and dry.     Coloration: Skin is not pale.     Findings: No erythema or rash.  Neurological:     Mental Status: She is alert and oriented to person, place, and time.     Motor: No abnormal muscle tone.     Coordination: Coordination normal.     Comments: Left sided weakness  Psychiatric:        Behavior: Behavior normal.        Thought Content: Thought content normal.        Judgment: Judgment normal.      CBC:    BMET No results for input(s): NA, K, CL, CO2, GLUCOSE, BUN, CREATININE, CALCIUM in the last 72  hours.   Liver Panel  No results for input(s): PROT, ALBUMIN, AST, ALT, ALKPHOS, BILITOT, BILIDIR, IBILI in the last 72 hours.     Sedimentation Rate No results for input(s): ESRSEDRATE in the last 72 hours. C-Reactive Protein No results for input(s): CRP in the last 72 hours.  Micro Results: Recent Results (from the past 720 hour(s))  Resp Panel by RT-PCR (Flu A&B, Covid) Nasopharyngeal Swab     Status: None   Collection Time: 02/07/21  3:00 PM   Specimen: Nasopharyngeal Swab; Nasopharyngeal(NP) swabs in vial transport medium  Result Value Ref Range Status   SARS Coronavirus 2 by RT PCR NEGATIVE NEGATIVE Final    Comment: (NOTE) SARS-CoV-2 target nucleic acids are NOT DETECTED.  The SARS-CoV-2 RNA is generally detectable in upper respiratory specimens during the acute phase of infection. The lowest concentration of SARS-CoV-2 viral copies this assay can detect is 138 copies/mL. A negative result does not preclude SARS-Cov-2 infection and should not be used as the sole basis for treatment or other patient management decisions. A negative result may occur with  improper specimen collection/handling, submission of specimen other than nasopharyngeal swab, presence of viral mutation(s) within the areas targeted by this assay, and inadequate number of viral copies(<138 copies/mL). A negative result must be combined with clinical observations, patient history, and epidemiological information. The expected result is Negative.  Fact Sheet for Patients:  EntrepreneurPulse.com.au  Fact Sheet for Healthcare Providers:  IncredibleEmployment.be  This test is no t yet approved or cleared by the [place] FDA and  has been authorized for detection and/or diagnosis of SARS-CoV-2 by FDA under an Emergency Use Authorization (EUA). This EUA will remain  in effect (meaning this test can be used) for the duration of the COVID-19 declaration under  Section 564(b)(1) of the Act, 21 U.S.C.section 360bbb-3(b)(1), unless the authorization is terminated  or revoked sooner.       Influenza A by PCR NEGATIVE NEGATIVE Final   Influenza B by PCR NEGATIVE NEGATIVE Final    Comment: (NOTE) The Xpert Xpress SARS-CoV-2/FLU/RSV plus assay is intended as an aid in the diagnosis of influenza from Nasopharyngeal swab specimens and should not be used as a sole basis for treatment. Nasal washings and aspirates are unacceptable for Xpert Xpress SARS-CoV-2/FLU/RSV testing.  Fact Sheet for Patients: EntrepreneurPulse.com.au  Fact Sheet for Healthcare Providers: IncredibleEmployment.be  This test is not yet approved  or cleared by the [place] and has been authorized for detection and/or diagnosis of SARS-CoV-2 by FDA under an Emergency Use Authorization (EUA). This EUA will remain in effect (meaning this test can be used) for the duration of the COVID-19 declaration under Section 564(b)(1) of the Act, 21 U.S.C. section 360bbb-3(b)(1), unless the authorization is terminated or revoked.  Performed at [facility], [place] [address], [place], [place] [address]   Culture, blood (Routine X 2) w Reflex to ID Panel     Status: None (Preliminary result)   Collection Time: 02/10/21  9:39 AM   Specimen: BLOOD RIGHT FOREARM  Result Value Ref Range Status   Specimen Description BLOOD RIGHT FOREARM  Final   Special Requests   Final    BOTTLES DRAWN AEROBIC ONLY Blood Culture results may not be optimal due to an inadequate volume of blood received in culture bottles   Culture   Final    NO GROWTH < 24 HOURS Performed at [facility], [place] [address], [place], [place] [address]    Report Status PENDING  Incomplete  Culture, blood (routine x 2)     Status: None (Preliminary result)   Collection Time: 02/10/21  1:50 PM   Specimen: Peripheral; Blood  Result Value Ref Range Status    Specimen Description Peripheral  Final   Special Requests NONE  Final   Culture   Final    NO GROWTH < 24 HOURS Performed at [facility], [place] [address] [place], [place] [address]    Report Status PENDING  Incomplete  Culture, blood (routine x 2)     Status: None (Preliminary result)   Collection Time: 02/10/21  1:56 PM   Specimen: BLOOD  Result Value Ref Range Status   Specimen Description BLOOD RIGHT ANTECUBITAL  Final   Special Requests   Final    BOTTLES DRAWN AEROBIC AND ANAEROBIC Blood Culture adequate volume   Culture   Final    NO GROWTH < 24 HOURS Performed at [facility], [place] [address] [place], [place] [address]    Report Status PENDING  Incomplete    Studies/Results: MR BRAIN W CONTRAST  Result Date: 02/10/2021 CLINICAL DATA:  Initial evaluation for neuro deficit, stroke suspected. EXAM: MRI HEAD WITH CONTRAST TECHNIQUE: Multiplanar, multiecho pulse sequences of the brain and surrounding structures were obtained with intravenous contrast. CONTRAST:  74mL GADAVIST GADOBUTROL 1 MMOL/ML IV SOLN COMPARISON:  Comparison made with recent noncontrast brain MRI from 02/07/2021. FINDINGS: Brain: Age-related cerebral atrophy with moderately advanced chronic microvascular ischemic disease again noted. Few scattered remote lacunar infarcts noted as well. Findings seen and characterized on recent brain MRI from 02/07/2021. Following contrast administration, there is mild patchy post-contrast enhancement about the recently identified right thalamic capsular infarct (series 7, image 30). Additional subtle patchy enhancement about the previously identified left occipital infarct as well (series 9, image 17). Findings in keeping with probable early subacute ischemic infarcts. No other abnormal or pathologic enhancement within the brain. No other mass lesion, midline shift, or mass effect. No hydrocephalus or extra-axial fluid collection. No other new or progressive finding on this  limited exam. Vascular: Normal intravascular enhancement seen throughout the major intracranial vascular structures. Skull and upper cervical spine: Craniocervical junction within normal limits. Bone marrow signal intensity normal. No scalp soft tissue abnormality. Sinuses/Orbits: Globes and orbital soft tissues within normal limits. Paranasal sinuses are largely clear. No mastoid effusion. Other: None. IMPRESSION: 1. Patchy post-contrast  enhancement about the recently identified right thalamocapsular and left occipital infarcts, in keeping with early subacute ischemic infarcts. No associated mass effect. 2. Underlying age-related cerebral atrophy with moderately advanced chronic microvascular ischemic disease. No other new intracranial abnormality on this limited postcontrast only exam. Electronically Signed   By: Jeannine Boga M.D.   On: 02/10/2021 00:02   MR LUMBAR SPINE WO CONTRAST  Result Date: 02/10/2021 CLINICAL DATA:  Vigil take a shin seen on cardiac echo. New onset back pain. Question epidural abscess. EXAM: MRI LUMBAR SPINE WITHOUT CONTRAST TECHNIQUE: Multiplanar, multisequence MR imaging of the lumbar spine was performed. No intravenous contrast was administered. COMPARISON:  None. FINDINGS: Segmentation:  5 lumbar type vertebral bodies. Alignment: Scoliotic curvature convex to the left with the apex at L3. Vertebrae: No evidence of fracture. No sign of bone or joint infection. No sign of infectious discitis. Benign appearing fat within the L1 vertebral body. This study was ordered and performed without contrast. Conus medullaris and cauda equina: Conus extends to the L1 level. Conus and cauda equina appear normal. I do not see any finding to suggest epidural infection. Contrast administration would increase sensitivity to early disease. Paraspinal and other soft tissues: Negative Disc levels: T12-L1 and L1-2: Normal L2-3: Mild bulging of the disc.  No compressive stenosis. L3-4: Moderate  bulging of the disc. Mild narrowing of both lateral recesses. No compressive stenosis. L4-5: Mild bulging of the disc.  No compressive stenosis. L5-S1: Endplate osteophytes and shallow left posterolateral disc herniation. Facet and ligamentous hypertrophy. Narrowing of the subarticular lateral recess on the left that could possibly affect the left S1 nerve. IMPRESSION: 1. No sign of epidural infection or other spinal infection in the lumbar region. This study was ordered without contrast. Contrast administration would increase sensitivity to early disease. 2. Lumbar degenerative changes as outlined above. Left posterolateral disc herniation at L5-S1 with narrowing of the subarticular lateral recess on the left that could possibly affect the left S1 nerve. Electronically Signed   By: Nelson Chimes M.D.   On: 02/10/2021 13:05   MR HIP LEFT WO CONTRAST  Result Date: 02/10/2021 CLINICAL DATA:  Left hip pain. Heart valve vegetation. Concern for joint infection EXAM: MR OF THE LEFT HIP WITHOUT CONTRAST TECHNIQUE: Multiplanar, multisequence MR imaging was performed. No intravenous contrast was administered. COMPARISON:  09/08/2020 FINDINGS: Bones: No acute fracture. No dislocation. No femoral head avascular necrosis. No bony erosion or periostitis. Bony pelvis intact. SI joints and pubic symphysis within normal limits. Mild right hip osteoarthritis with associated labral degeneration. No right hip joint effusion. Degenerative disc disease of L5-S1. Articular cartilage and labrum Articular cartilage: Mild chondral thinning and surface irregularity without focal defect. Labrum: Superior labral degeneration and tearing. 5 mm paralabral cyst along the anteroinferior margin of the labrum. Joint or bursal effusion Joint effusion:  None. Bursae: No bursal fluid collections. Muscles and tendons Muscles and tendons: Mild tendinosis of the bilateral hamstring tendon origins. The gluteal, iliopsoas, rectus femoris, and adductor  tendons appear intact without tear or significant tendinosis. Normal muscle bulk and signal intensity without edema, atrophy, or fatty infiltration. Other findings Miscellaneous: No soft tissue edema or fluid collection. No inguinal lymphadenopathy. No acute findings within the pelvis. IMPRESSION: 1. No acute findings. Specifically, no evidence of septic arthritis or osteomyelitis. 2. Mild bilateral hip osteoarthritis with superior labral degeneration. 3. Mild bilateral hamstring tendinosis. Electronically Signed   By: Davina Poke D.O.   On: 02/10/2021 13:14   ECHO TEE  Result Date:  02/10/2021    TRANSESOPHOGEAL ECHO REPORT   Patient Name:   Sheila Guerrero Date of Exam: 02/10/2021 Medical Rec #:  [id number]          Height:       65.0 in Accession #:    [id number]         Weight:       182.0 lb Date of Birth:  [birth year]          BSA:          1.900 m Patient Age:    73 years           BP:           171/108 mmHg Patient Gender: F                  HR:           105 bpm. Exam Location:  Inpatient Procedure: Transesophageal Echo Indications:     Stroke  History:         Patient has prior history of Echocardiogram examinations, most                  recent 02/08/2021. Risk Factors:Hypertension. Acute CVA.  Sonographer:     Clayton Lefort RDCS (AE) Referring Phys:  [place] Diagnosing Phys: Dixie Dials MD PROCEDURE: After discussion of the risks and benefits of a TEE, an informed consent was obtained from the patient. The transesophogeal probe was passed without difficulty through the esophogus of the patient. Sedation performed by different physician. The patient was monitored while under deep sedation. Anesthestetic sedation was provided intravenously by Anesthesiology: 222.49mg  of Propofol, 60mg  of Lidocaine. Image quality was good. The patient developed no complications during the procedure. IMPRESSIONS  1. Left ventricular ejection fraction, by estimation, is 45 to 50%. The left ventricle has mildly  decreased function. The left ventricle demonstrates global hypokinesis. Left ventricular diastolic function could not be evaluated.  2. Right ventricular systolic function is normal. The right ventricular size is normal.  3. Left atrial size was moderately dilated. No left atrial/left atrial appendage thrombus was detected.  4. Right atrial size was mildly dilated.  5. The pericardial effusion is posterior to the left ventricle. There is no evidence of cardiac tamponade.  6. Mobile small MV vegetations seen on both leaflets. The mitral valve is normal in structure. Moderate mitral valve regurgitation.  7. Sessile cauliflower shaped vegetation measuring 0.428 x 0.712 cm on anterior leaflet mid portion of TV.  8. The aortic valve is tricuspid. Aortic valve regurgitation is not visualized. No aortic stenosis is present.  9. There is mild (Grade II) atheroma plaque involving the descending aorta and ascending aorta. 10. The inferior vena cava is normal in size with greater than 50% respiratory variability, suggesting right atrial pressure of 3 mmHg. 11. Evidence of atrial level shunting detected by color flow Doppler. Agitated saline contrast bubble study was negative, with no evidence of any interatrial shunt. There is a small patent foramen ovale with predominantly right to left shunting across the atrial septum. FINDINGS  Left Ventricle: Left ventricular ejection fraction, by estimation, is 45 to 50%. The left ventricle has mildly decreased function. The left ventricle demonstrates global hypokinesis. The left ventricular internal cavity size was normal in size. There is  borderline concentric left ventricular hypertrophy. Left ventricular diastolic function could not be evaluated. Right Ventricle: The right ventricular size is normal. No increase in right ventricular wall thickness. Right ventricular systolic function is  normal. Left Atrium: Left atrial size was moderately dilated. No left atrial/left atrial  appendage thrombus was detected. Right Atrium: Right atrial size was mildly dilated. Pericardium: Trivial pericardial effusion is present. The pericardial effusion is posterior to the left ventricle. There is no evidence of cardiac tamponade. Mitral Valve: Mobile small MV vegetations seen on both leaflets. The mitral valve is normal in structure. Normal mobility of the mitral valve leaflets. Moderate mitral valve regurgitation, with centrally-directed jet. Tricuspid Valve: Sessile cauliflower shaped vegetation measuring 0.428 x 0.712 cm on anterior leaflet mid portion of TV. The tricuspid valve is normal in structure. Tricuspid valve regurgitation is mild . No evidence of tricuspid stenosis. Aortic Valve: The aortic valve is tricuspid. Aortic valve regurgitation is not visualized. No aortic stenosis is present. Aortic valve mean gradient measures 3.0 mmHg. Aortic valve peak gradient measures 4.8 mmHg. Pulmonic Valve: The pulmonic valve was normal in structure. Pulmonic valve regurgitation is trivial. Aorta: The aortic root is normal in size and structure. There is mild (Grade II) atheroma plaque involving the descending aorta and ascending aorta. Venous: The left lower pulmonary vein and right lower pulmonary vein are normal. The inferior vena cava is normal in size with greater than 50% respiratory variability, suggesting right atrial pressure of 3 mmHg. IAS/Shunts: The interatrial septum appears to be lipomatous. Evidence of atrial level shunting detected by color flow Doppler. Agitated saline contrast was given intravenously to evaluate for intracardiac shunting. Agitated saline contrast bubble study was negative, with no evidence of any interatrial shunt. A small patent foramen ovale is detected with predominantly right to left shunting across the atrial septum.  AORTIC VALVE AV Vmax:      110.00 cm/s AV Vmean:     78.800 cm/s AV VTI:       0.176 m AV Peak Grad: 4.8 mmHg AV Mean Grad: 3.0 mmHg Dixie Dials MD  Electronically signed by Dixie Dials MD Signature Date/Time: 02/10/2021/11:02:32 AM    Final    VAS US CAROTID  Result Date: 02/10/2021 Carotid Arterial Duplex Study Indications:       CVA. Risk Factors:      Hypertension, hyperlipidemia, Diabetes, prior CVA. Comparison Study:  03-28-2017 Prior carotid duplex showed 1-39% stenosis of the                    ICA bilaterally. Performing Technologist: Darlin Coco RDMS,RVT  Examination Guidelines: A complete evaluation includes B-mode imaging, spectral Doppler, color Doppler, and power Doppler as needed of all accessible portions of each vessel. Bilateral testing is considered an integral part of a complete examination. Limited examinations for reoccurring indications may be performed as noted.  Right Carotid Findings: +----------+--------+--------+--------+------------------+--------+           PSV cm/sEDV cm/sStenosisPlaque DescriptionComments +----------+--------+--------+--------+------------------+--------+ CCA Prox  61      10                                         +----------+--------+--------+--------+------------------+--------+ CCA Distal67      13                                         +----------+--------+--------+--------+------------------+--------+ ICA Prox  52      23      1-39%   heterogenous               +----------+--------+--------+--------+------------------+--------+  ICA Distal72      30                                         +----------+--------+--------+--------+------------------+--------+ ECA       69      8                                          +----------+--------+--------+--------+------------------+--------+ +----------+--------+-------+----------------+-------------------+           PSV cm/sEDV cmsDescribe        Arm Pressure (mmHG) +----------+--------+-------+----------------+-------------------+ Subclavian119            Multiphasic, WNL                     +----------+--------+-------+----------------+-------------------+ +---------+--------+--+--------+--+---------+ VertebralPSV cm/s43EDV cm/s11Antegrade +---------+--------+--+--------+--+---------+  Left Carotid Findings: +----------+--------+--------+--------+------------------+--------+           PSV cm/sEDV cm/sStenosisPlaque DescriptionComments +----------+--------+--------+--------+------------------+--------+ CCA Prox  97      17                                         +----------+--------+--------+--------+------------------+--------+ CCA Distal65      16                                         +----------+--------+--------+--------+------------------+--------+ ICA Prox  54      27      1-39%   heterogenous               +----------+--------+--------+--------+------------------+--------+ ICA Distal77      37                                         +----------+--------+--------+--------+------------------+--------+ ECA       91      9                                          +----------+--------+--------+--------+------------------+--------+ +----------+--------+--------+----------------+-------------------+           PSV cm/sEDV cm/sDescribe        Arm Pressure (mmHG) +----------+--------+--------+----------------+-------------------+ Subclavian129             Multiphasic, WNL                    +----------+--------+--------+----------------+-------------------+ +---------+--------+--+--------+--+---------+ VertebralPSV cm/s53EDV cm/s23Antegrade +---------+--------+--+--------+--+---------+   Summary: Right Carotid: Velocities in the right ICA are consistent with a 1-39% stenosis. Left Carotid: Velocities in the left ICA are consistent with a 1-39% stenosis. Vertebrals:  Bilateral vertebral arteries demonstrate antegrade flow. Subclavians: Normal flow hemodynamics were seen in bilateral subclavian              arteries. *See table(s) above  for measurements and observations.  Electronically signed by Antony Contras MD on 02/10/2021 at 12:50:53 PM.    Final       Assessment/Plan:  INTERVAL HISTORY:   Blood cultures taken yesterday prior to starting antibiotics  MRI  of the lumbar spine and hips is unrevealing.   Principal Problem:   Subacute endocarditis Active Problems:   Type 2 diabetes mellitus with hyperlipidemia (HCC)   Paresthesia   Right thalamic infarction [facility])   Essential hypertension   Acute ischemic stroke ([place])   Malnutrition of moderate degree   Septic embolism (HCC)    Sheila Guerrero is a 60 y.o. female with what appears to have been subacute bacterial endocarditis with infection of bicuspid and mitral valve and septic embolization to the brain.  She also has some severe pain on her right side that radiates down her leg.  MRI was performed of her hip yesterday.  Note MRI is labeled left hip but I spoke with the radiologist who read the films and both hips were clearly visualized.  There is no evidence of infection of the hips which does not surprise me because her exam was not consistent with a hip infection.  MRI of the lumbar spine was also unrevealing.  It is possible does she could have a discitis that is just not visible on imaging at this point in time.  Subacute bacterial endocarditis:   --Likely to be culture negative but will follow up on cultures from today.  --I ordered repeat blood cultures today in case the 1 some yesterday turned positive I want to be able to prove that we are sterilizing her blood with the antibiotics that we began yesterday.  I am also ordering a Panorex of her mouth to see if there is any evidence of odontogenic source  Hip and back pain: As mentioned could be a discitis that is not radiographically visible at this time.  No point of imaging at this point time but could consider in the future if pain worsens.  I spent greater than 35 minutes with the patient  including greater than 50% of time in face to face counsel of the patient regarding her endocarditis septic embolization and in coordination of her care with radiology and the primary team.       LOS: 3 days   Rhina Brackett Dam 02/11/2021, 11:05 AM

## 2021-02-11 NOTE — Progress Notes (Signed)
Physical Therapy Treatment Patient Details Name: Sheila Guerrero MRN: [id number] DOB: 10/18/1961 Today's Date: 02/11/2021    History of Present Illness Pt adm to [facility] 3/12 with several week history of paresthesias, pain, and progressive weakness on lt side. Pt seen by Dr. Krista Blue, outpatient neurology, on 3/7 with concern for rt thalamic infarct but unable to have MRI scheduled until 3/15. Pt developed right lower extremity sciatica. MRI showed small acute to early subacute infarct in the right  thalamus/posterior limb of internal capsule. and small subacute left occipital lobe infarct. Pt transferred to [facility] on 12/13. PMH -angioedema, htn, DM, anxiety, depression, CVA in 2017.    After stroke in 2017 pt reports that she gets lost easily in well known areas and difficulty finding objects as well difficulty with time and date.    PT Comments    Patient progressing towards physical therapy goals. Patient ambulated 300' with no AD and supervision, lateral LOB with horizontal head turns. Patient performed standing exercises with one UE support. Continue to recommend OPPT following discharge to maximize functional independence.   Follow Up Recommendations  Outpatient PT     Equipment Recommendations  None recommended by PT    Recommendations for Other Services       Precautions / Restrictions Precautions Precautions: Fall Restrictions Weight Bearing Restrictions: No    Mobility  Bed Mobility               General bed mobility comments: Up in room on arrival    Transfers Overall transfer level: Modified independent Equipment used: None                Ambulation/Gait Ambulation/Gait assistance: Supervision Gait Distance (Feet): 300 Feet Assistive device: None Gait Pattern/deviations: Step-through pattern;Wide base of support;Decreased stride length     General Gait Details: lateral LOB with horizontal head turns during ambulation   Stairs Stairs: Yes Stairs  assistance: Supervision Stair Management: No rails;Forwards;Step to pattern Number of Stairs: 4 General stair comments: instructed on ascent with R and descent with L. HHAx1 for descent for comfort   Wheelchair Mobility    Modified Rankin (Stroke Patients Only) Modified Rankin (Stroke Patients Only) Pre-Morbid Rankin Score: No significant disability Modified Rankin: Moderately severe disability     Balance Overall balance assessment: Needs assistance Sitting-balance support: No upper extremity supported;Feet supported Sitting balance-Leahy Scale: Good     Standing balance support: No upper extremity supported;During functional activity Standing balance-Leahy Scale: Fair Standing balance comment: Good balance with pt focused on task but deteriorates with pt distracted. Lateral LOB with head turns during ambulation                            Cognition Arousal/Alertness: Awake/alert Behavior During Therapy: WFL for tasks assessed/performed Overall Cognitive Status: Within Functional Limits for tasks assessed                                 General Comments: hx of executive functioning difficulties since CVA in 2017, compensates well      Exercises Other Exercises Other Exercises: Toe/Heel raises with one UE support x 10 Other Exercises: Standing marching x 10 Other Exercises: Standing B hip flexion with one UE support x 10 Other Exercises: Standing B hip abduction x 10 with one UE support    General Comments        Pertinent Vitals/Pain Pain Assessment: No/denies pain  Home Living                      Prior Function            PT Goals (current goals can now be found in the care plan section) Acute Rehab PT Goals Patient Stated Goal: Regain IND PT Goal Formulation: With patient Time For Goal Achievement: 02/22/21 Potential to Achieve Goals: Good Progress towards PT goals: Progressing toward goals    Frequency    Min  4X/week      PT Plan Current plan remains appropriate    Co-evaluation              AM-PAC PT "6 Clicks" Mobility   Outcome Measure  Help needed turning from your back to your side while in a flat bed without using bedrails?: None Help needed moving from lying on your back to sitting on the side of a flat bed without using bedrails?: None Help needed moving to and from a bed to a chair (including a wheelchair)?: None Help needed standing up from a chair using your arms (e.g., wheelchair or bedside chair)?: None Help needed to walk in hospital room?: A Little Help needed climbing 3-5 steps with a railing? : A Little 6 Click Score: 22    End of Session   Activity Tolerance: Patient tolerated treatment well Patient left: in bed;with call bell/phone within reach;with family/visitor present Nurse Communication: Mobility status PT Visit Diagnosis: Unsteadiness on feet (R26.81);Difficulty in walking, not elsewhere classified (R26.2);Hemiplegia and hemiparesis Hemiplegia - Right/Left: Left Hemiplegia - dominant/non-dominant: Non-dominant Hemiplegia - caused by: Cerebral infarction     Time: 1353-1411 PT Time Calculation (min) (ACUTE ONLY): 18 min  Charges:  $Therapeutic Activity: 8-22 mins                     Alayna A. Gilford Rile PT, DPT Acute Rehabilitation Services Pager [phone] Office [phone]    Linna Hoff 02/11/2021, 2:45 PM

## 2021-02-12 ENCOUNTER — Inpatient Hospital Stay: Payer: Self-pay

## 2021-02-12 DIAGNOSIS — R7881 Bacteremia: Secondary | ICD-10-CM

## 2021-02-12 LAB — GLUCOSE, CAPILLARY
Glucose-Capillary: 268 mg/dL — ABNORMAL HIGH (ref 70–99)
Glucose-Capillary: 281 mg/dL — ABNORMAL HIGH (ref 70–99)
Glucose-Capillary: 251 mg/dL — ABNORMAL HIGH (ref 70–99)

## 2021-02-12 MED ORDER — LIDOCAINE 5 % EX PTCH: 1.0000 | MEDICATED_PATCH | CUTANEOUS | 0 refills | Status: DC

## 2021-02-12 MED ORDER — DAPTOMYCIN IV (FOR PTA / DISCHARGE USE ONLY): 800.0000 mg | INTRAVENOUS | 0 refills | Status: AC

## 2021-02-12 MED ORDER — ASPIRIN 81 MG PO TBEC: 81.0000 mg | DELAYED_RELEASE_TABLET | Freq: Every day | ORAL | 11 refills | Status: DC

## 2021-02-12 MED ORDER — ATORVASTATIN CALCIUM 20 MG PO TABS: 20.0000 mg | ORAL_TABLET | Freq: Every day | ORAL | Status: DC

## 2021-02-12 MED ORDER — CARVEDILOL 3.125 MG PO TABS
3.1250 mg | ORAL_TABLET | Freq: Two times a day (BID) | ORAL | 2 refills | Status: DC
Start: 2021-02-12 — End: 2021-03-13

## 2021-02-12 MED ORDER — MORPHINE SULFATE 15 MG PO TABS: 7.5000 mg | ORAL_TABLET | ORAL | 0 refills | Status: AC | PRN

## 2021-02-12 MED ORDER — CYCLOBENZAPRINE HCL 10 MG PO TABS: 5.0000 mg | ORAL_TABLET | Freq: Three times a day (TID) | ORAL | Status: DC | PRN

## 2021-02-12 MED ORDER — CLOPIDOGREL BISULFATE 75 MG PO TABS: 75.0000 mg | ORAL_TABLET | Freq: Every day | ORAL | 11 refills | Status: AC

## 2021-02-12 MED ORDER — CHLORHEXIDINE GLUCONATE CLOTH 2 % EX PADS: 6.0000 | MEDICATED_PAD | Freq: Every day | CUTANEOUS | Status: DC

## 2021-02-12 MED ORDER — INSULIN DETEMIR 100 UNIT/ML FLEXPEN: 20.0000 [IU] | PEN_INJECTOR | Freq: Every day | SUBCUTANEOUS | 11 refills | Status: DC

## 2021-02-12 MED ORDER — INSULIN DETEMIR 100 UNIT/ML ~~LOC~~ SOLN
20.0000 [IU] | Freq: Every day | SUBCUTANEOUS | Status: DC
  Administered 2021-02-12: 20 [IU] via SUBCUTANEOUS
  Filled 2021-02-12: qty 0.2

## 2021-02-12 MED ORDER — SODIUM CHLORIDE 0.9% FLUSH: 10.0000 mL | INTRAVENOUS | Status: DC | PRN

## 2021-02-12 MED ORDER — SACUBITRIL-VALSARTAN 24-26 MG PO TABS: 1.0000 | ORAL_TABLET | Freq: Two times a day (BID) | ORAL | 0 refills | Status: DC

## 2021-02-12 MED ORDER — PEN NEEDLES 29G X 12MM MISC: 1.0000 | Freq: Every day | 2 refills | Status: AC

## 2021-02-12 MED ORDER — MORPHINE SULFATE 15 MG PO TABS: 15.0000 mg | ORAL_TABLET | ORAL | Status: DC | PRN

## 2021-02-12 MED ORDER — CEFTRIAXONE IV (FOR PTA / DISCHARGE USE ONLY): 2.0000 g | Freq: Two times a day (BID) | INTRAVENOUS | 0 refills | Status: AC

## 2021-02-12 NOTE — Progress Notes (Signed)
PHARMACY CONSULT NOTE FOR:  OUTPATIENT  PARENTERAL ANTIBIOTIC THERAPY (OPAT)  Indication: Endocarditis Regimen: ceftriaxone 2g IV q12h, daptomycin 800 mg IV q24h  End date: 03/18/2021  IV antibiotic discharge orders are pended. To discharging provider:  please sign these orders via discharge navigator,  Select New Orders & click on the button choice - Manage This Unsigned Work.     Thank you for allowing pharmacy to be a part of this patient's care.  Claudina Lick, PharmD PGY1 Acute Care Pharmacy Resident 02/12/2021 1:43 PM  Please check [email or web address] for unit-specific pharmacy phone numbers.

## 2021-02-12 NOTE — Progress Notes (Signed)
Spoke to primary and made aware PICC  will be place tomorrow 3/18.

## 2021-02-12 NOTE — Progress Notes (Signed)
Peripherally Inserted Central Catheter Placement  The IV Nurse has discussed with the patient and/or persons authorized to consent for the patient, the purpose of this procedure and the potential benefits and risks involved with this procedure.  The benefits include less needle sticks, lab draws from the catheter, and the patient may be discharged home with the catheter. Risks include, but not limited to, infection, bleeding, blood clot (thrombus formation), and puncture of an artery; nerve damage and irregular heartbeat and possibility to perform a PICC exchange if needed/ordered by physician.  Alternatives to this procedure were also discussed.  Bard Power PICC patient education guide, fact sheet on infection prevention and patient information card has been provided to patient /or left at bedside.    PICC Placement Documentation  PICC Single Lumen 33/38/32 PICC Right Basilic 40 cm 0 cm (Active)  Indication for Insertion or Continuance of Line Prolonged intravenous therapies 02/12/21 1530  Exposed Catheter (cm) 0 cm 02/12/21 1530  Site Assessment Dry;Clean;Intact 02/12/21 1530  Line Status Flushed;Blood return noted;Saline locked 02/12/21 1530  Dressing Type Transparent 02/12/21 1530  Dressing Status Clean;Dry;Intact 02/12/21 1530  Antimicrobial disc in place? Yes 02/12/21 1530  Dressing Change Due 02/19/21 02/12/21 1530       Scotty Court 02/12/2021, 3:56 PM

## 2021-02-12 NOTE — Discharge Summary (Signed)
Physician Discharge Summary  Sheila Guerrero YDX:[id number] DOB: [birth year] DOA: 02/07/2021  PCP: Donald Prose, MD  Admit date: 02/07/2021 Discharge date: 02/12/2021  Admitted From: Home Disposition: Home with home health infusion therapy, outpatient physical therapy rehab  Recommendations for Outpatient Follow-up:  1. Follow up with PCP in 1-2 weeks 2. Blood draws as per home health infusion therapy instructions below, to be communicated with infection disease clinic. 3. Please follow up on the following pending results: Blood cultures.  Home Health: Home health RN Equipment/Devices: None  Discharge Condition: Stable CODE STATUS: Full code Diet recommendation: Low-salt, low-carb diet  Discharge summary: 60 year old female with history of hypertension, hyperlipidemia, type 2 diabetes on Metformin at home, anxiety and depression, history of a stroke in 2017 with no neurological deficit presented to the ER with 4 weeks of left-sided weakness and numbness and she was having outpatient work-up for that, patient started having right hip pain so came to the hospital.  MRI showed acute/subacute right MCA stroke.  TEE showed mitral valve and tricuspid valve endocarditis with no evidence of systemic infection.  Patient did have new and worsening right hip pain, otherwise no localizing evidence of infection.  Seen and followed by neurology and infectious disease.  Discharging today.  Multiple medical issues addressed as below.   Acute/subacute right MCA territory stroke ischemic: Presented with left-sided sensory deficit with mild ataxia that is mostly improving. MRI of the brain showed small acute right thalamus and posterior limb of internal capsule infarct, left occipital lobe infarct. MRA of the head showed severe right M1 and moderate right V4 stenosis.  No large vessel occlusion. TTE showed ejection fraction 40 to 45% with global hypokinesis.  Moderate concentric LVH.  No source of embolism.   Carotid duplexes with no significant stenosis. TEE showed vegetations in mitral and tricuspid valve.  2 subsequent blood cultures negative. Plan, As per neurology recommendation aspirin 81 mg daily forever, Plavix for 3 weeks and stop.  She is already on Plavix, will complete 18 more days. LDL is 55 and on goal.  She is on atorvastatin 20 mg daily.  Going home on daptomycin.  She will hold taking Lipitor until she is on daptomycin to avoid risk of rhabdomyolysis. Seen by therapies.  Recommended outpatient rehab. Outpatient neurology follow-up.  Mitral valve/tricuspid valve vegetation: Accidentally found both right and left-sided vegetation on TEE exam.  Patient with some chronic weakness and fatigue but no fever, recent infection or intervention.  She does go to chiropractor but no invasive procedure done. With new onset pain, MRI of the pelvis that included both joints done did not show any evidence of infection. MRI of the lumbar spine with no evidence of infection. Panorex with no evidence of dental infection. 2 sets of blood cultures on 3/15, 2 sets of blood cultures on 3/16 no growth so far. Treating as culture-negative bacterial endocarditis as per infectious disease recommendation, PICC line placed and will be treated at home with home infusion therapy with daptomycin and Rocephin for 6 weeks.  Will need frequent renal function, liver functions and creatinine kinase monitoring, instructions provided to home health infusion therapy.  Cardiomyopathy: Suspected hypertensive cardiomyopathy.  Cardiology recommended to discontinue lisinopril. Going home on carvedilol 3.125 mg 2 times a day.  Started on [place].  Electrolytes were replaced and adequate.  Right hip pain/sciatica: MRIs with no significant nerve compression or structural problem.  On physical exam, no evidence of neurological compromise.  Patient continued to complain of pain. She had recently increased  her dose of Lyrica that she  will continue. Different medications were tried, she will be discharged on a short course of oral morphine 7.5 mg every 4 hours as needed for pain, she will also use local therapies including heating pad, lidocaine patches. I explained to the patient that she should taper off pain medications as soon as possible and use local heating therapy, topical pain medications.  Uncontrolled type 2 diabetes: Hemoglobin A1c was 12.  Only on Metformin at home.  She was counseled, educated, agreeable to start on insulin.  At this time, she will be discharged home on 20 units of long-acting insulin once a day, she will continue Metformin 2 g a day.  She will keep a logbook of her blood sugars at home and bring it to doctor's office for follow-up.  Hyperlipidemia: Tolerating a statin.  On 20 mg of Lipitor.  LDL is 55 and at goal.  Significant interaction with daptomycin with high risk of rhabdomyolysis.  Hold statin until on daptomycin.  Patient is medically stable.  She is going home today after arrangements are made for home infusion therapy tomorrow.  Discharge Diagnoses:  Principal Problem:   Subacute endocarditis Active Problems:   Type 2 diabetes mellitus with hyperlipidemia ([place])   Paresthesia   Infarction of right thalamus ([facility])   Essential hypertension   Acute ischemic stroke ([place])   Malnutrition of moderate degree   Septic embolism ([place])   Bacteremia    Discharge Instructions  Discharge Instructions    [facility] pharmacist to adjust dose for Vancomycin, Aminoglycosides and other anti-infective therapies as requested by physician.   Complete by: As directed    [facility] to provide Cath Flo 85m   Complete by: As directed    Administer for PICC line occlusion and as ordered by physician for other access device issues.   Ambulatory referral to Occupational Therapy   Complete by: As directed    Ambulatory referral to Physical Therapy   Complete by: As directed     Ambulatory referral to Speech Therapy   Complete by: As directed    Anaphylaxis Kit: Provided to treat any anaphylactic reaction to the medication being provided to the patient if First Dose or when requested by physician   Complete by: As directed    Epinephrine 118mml vial / amp: Administer 0.49m649m0.49ml59mubcutaneously once for moderate to severe anaphylaxis, nurse to call physician and pharmacy when reaction occurs and call 911 if needed for immediate care   Diphenhydramine 50mg81mIV vial: Administer 25-50mg 35mM PRN for first dose reaction, rash, itching, mild reaction, nurse to call physician and pharmacy when reaction occurs   Sodium Chloride 0.9% NS 500ml I48mdminister if needed for hypovolemic blood pressure drop or as ordered by physician after call to physician with anaphylactic reaction   Call MD for:  difficulty breathing, headache or visual disturbances   Complete by: As directed    Call MD for:  extreme fatigue   Complete by: As directed    Call MD for:  persistant dizziness or light-headedness   Complete by: As directed    Call MD for:  severe uncontrolled pain   Complete by: As directed    Change dressing on IV access line weekly and PRN   Complete by: As directed    Diet - low sodium heart healthy   Complete by: As directed    Diet Carb Modified   Complete by: As directed    Discharge instructions  Complete by: As directed    There are multiple changes on your medications , please follow new directions.  Do not drive / operate machinery while on opioids. Check your blood pressures and blood sugars at home and keep a log book and bring it to doctors office for follow up. You will not take cholesterol pill until you finish taking Daptomycin antibiotics   Flush IV access with Sodium Chloride 0.9% and Heparin 10 units/ml or 100 units/ml   Complete by: As directed    Home infusion instructions - [facility]   Complete by: As directed    Instructions: Flush IV  access with Sodium Chloride 0.9% and Heparin 10units/ml or 100units/ml   Change dressing on IV access line: Weekly and PRN   Instructions Cath Flo 61m: Administer for PICC Line occlusion and as ordered by physician for other access device   [facility] pharmacist to adjust dose for: Vancomycin, Aminoglycosides and other anti-infective therapies as requested by physician   Increase activity slowly   Complete by: As directed    Method of administration may be changed at the discretion of home infusion pharmacist based upon assessment of the patient and/or caregiver's ability to self-administer the medication ordered   Complete by: As directed    No wound care   Complete by: As directed      Allergies as of 02/12/2021      Reactions   Sulfa Antibiotics Swelling   Dulaglutide    Other reaction(s): rash   Empagliflozin    Other reaction(s): recurrent yeast   Iodinated Diagnostic Agents    Other reaction(s): angioedema with CT dye   Latex Rash      Medication List    STOP taking these medications   lisinopril 20 MG tablet Commonly known as: ZESTRIL   traMADol 50 MG tablet Commonly known as: ULTRAM     TAKE these medications   acetaminophen 500 MG tablet Commonly known as: TYLENOL Take 1,000 mg by mouth every 6 (six) hours as needed for moderate pain.   aspirin 81 MG EC tablet Take 1 tablet (81 mg total) by mouth daily. Swallow whole. Start taking on: February 13, 2021   Astragalus Root Powd Take 1 Dose by mouth daily.   atorvastatin 20 MG tablet Commonly known as: LIPITOR Take 1 tablet (20 mg total) by mouth daily. Not to be taken until finishing antibiotics What changed: additional instructions   CALCIUM-VITAMIN D PO Take 1 tablet by mouth daily.   carvedilol 3.125 MG tablet Commonly known as: COREG Take 1 tablet (3.125 mg total) by mouth 2 (two) times daily with a meal.   cefTRIAXone  IVPB Commonly known as: ROCEPHIN Inject 2 g into the vein every 12  (twelve) hours. Indication: Endocarditis First Dose: No Last Day of Therapy:  03/18/2021 Labs - Once weekly:  CBC/D and BMP, Labs - Every other week:  ESR and CRP Method of administration: IV Push Method of administration may be changed at the discretion of home infusion pharmacist based upon assessment of the patient and/or caregiver's ability to self-administer the medication ordered.   CINNAMON PO Take 1 tablet by mouth daily.   clopidogrel 75 MG tablet Commonly known as: PLAVIX Take 1 tablet (75 mg total) by mouth daily for 18 days.   daptomycin  IVPB Commonly known as: CUBICIN Inject 800 mg into the vein daily. Indication:  Endocarditis First Dose: No Last Day of Therapy:  03/18/2021 Labs - Once weekly:  CBC/D, BMP, and  CPK Labs - Every other week:  ESR and CRP Method of administration: IV Push Method of administration may be changed at the discretion of home infusion pharmacist based upon assessment of the patient and/or caregiver's ability to self-administer the medication ordered.   EPINEPHrine 0.3 mg/0.3 mL Soaj injection Commonly known as: EPI-PEN Inject 0.3 mg into the muscle daily as needed.   Flax Seed Oil 1000 MG Caps Take 1 capsule by mouth daily.   insulin detemir 100 UNIT/ML FlexPen Commonly known as: LEVEMIR Inject 20 Units into the skin at bedtime.   lidocaine 5 % Commonly known as: LIDODERM Place 1 patch onto the skin daily. Remove & Discard patch within 12 hours or as directed by MD Start taking on: February 13, 2021   MAGNESIUM GLUCONATE PO Take 1 tablet by mouth daily.   melatonin 3 MG Tabs tablet Take 3 mg by mouth daily as needed.   metFORMIN 500 MG 24 hr tablet Commonly known as: GLUCOPHAGE-XR Take 2,000 mg by mouth daily.   morphine 15 MG tablet Commonly known as: MSIR Take 0.5 tablets (7.5 mg total) by mouth every 4 (four) hours as needed for up to 5 days for moderate pain or severe pain.   OIL OF OREGANO PO Take 1 capsule by mouth  daily.   OLIVE LEAF EXTRACT PO Take 1 capsule by mouth daily.   omega-3 fish oil 1000 MG Caps capsule Commonly known as: MAXEPA Take 1 capsule by mouth daily.   Pen Needles 29G X 12MM Misc 1 each by Does not apply route daily.   pregabalin 75 MG capsule Commonly known as: LYRICA Take 150 mg by mouth 2 (two) times daily.   sacubitril-valsartan 24-26 MG Commonly known as: ENTRESTO Take 1 tablet by mouth 2 (two) times daily.   sodium chloride 0.65 % Soln nasal spray Commonly known as: OCEAN Place 1 spray into both nostrils daily as needed for congestion.   traZODone 100 MG tablet Commonly known as: DESYREL Take 100 mg by mouth at bedtime.   Turmeric 500 MG Caps Take 1 tablet by mouth daily.   Vitamin C 500 MG Caps Take 500 mg by mouth daily.   Vitamin D-3 5000 UNIT/ML Liqd Take 1 tablet by mouth daily.            Discharge Care Instructions  (From admission, onward)         Start     Ordered   02/12/21 0000  Change dressing on IV access line weekly and PRN  (Home infusion instructions - [facility] )        02/12/21 1415          Follow-up Information    [place] Follow up.   Specialty: Rehabilitation Why: The outpatient therapy will contact you for the first appointment Contact information: [place] Follow up.   Why: The home health agency will contact you for the first home visit. Home Health RN for IV antibiotics Contact information: [phone]             Allergies  Allergen Reactions  . Sulfa Antibiotics Swelling  . Dulaglutide     Other reaction(s): rash  . Empagliflozin     Other reaction(s): recurrent yeast  . Iodinated Diagnostic Agents     Other reaction(s): angioedema with CT dye  . Latex Rash    Consultations:  Neurology  Infectious  disease   Procedures/Studies: DG Orthopantogram  Result Date: 02/11/2021 CLINICAL DATA:  Bacteremia EXAM: ORTHOPANTOGRAM/PANORAMIC COMPARISON:  None. FINDINGS: Several teeth are missing. Impacted third molar on the right superiorly. No fracture or dislocation. No erosive change or bony destruction. No dental abscess evident. No large carious lesions. IMPRESSION: Several teeth missing. Impacted superior right third molar. No erosive change or bony destruction. No dental abscess appreciable. No fracture or dislocation. Electronically Signed   By: Lowella Grip III M.D.   On: 02/11/2021 11:24   MR ANGIO HEAD WO CONTRAST  Result Date: 02/07/2021 CLINICAL DATA:  Stroke. EXAM: MRA HEAD WITHOUT CONTRAST TECHNIQUE: Angiographic images of the Circle of Willis were obtained using MRA technique without intravenous contrast. COMPARISON:  None. FINDINGS: The visualized distal vertebral arteries are patent to the basilar with the left being dominant. There is moderate irregular narrowing of the distal right V4 segment. Patent PICAs and SCAs are seen bilaterally with evidence of a severe stenosis of the origin of the right SCA. The basilar artery is widely patent. Both PCAs are patent without evidence of a significant proximal stenosis. The internal carotid arteries are widely patent from skull base to carotid termini. ACAs and MCAs are patent with mild branch vessel irregularity but without evidence of a proximal branch occlusion or significant A1 or left M1 stenosis. There is a severe distal right M1 stenosis. An accessory left MCA and azygos A2 are noted, normal variants. No aneurysm is identified. IMPRESSION: 1. Intracranial atherosclerosis including severe right M1 and moderate right V4 stenoses. 2. No large vessel occlusion. Electronically Signed   By: Logan Bores M.D.   On: 02/07/2021 18:20   MR Brain Wo Contrast (neuro protocol)  Result Date: 02/07/2021 CLINICAL DATA:  Left-sided weakness for 4 weeks.  EXAM: MRI HEAD WITHOUT CONTRAST TECHNIQUE: Multiplanar, multiecho pulse sequences of the brain and surrounding structures were obtained without intravenous contrast. COMPARISON:  03/02/2017 FINDINGS: Brain: There is an approximately 1.5 cm acute to early subacute infarct involving the lateral aspect of the right thalamus and posterior limb of the right internal capsule. There is also likely a small subacute infarct in the left occipital lobe. There are multiple chronic microhemorrhages scattered throughout the cerebrum and cerebellum including prominent bi thalamic involvement as well as in the pons with the distribution suggestive of chronic hypertension. T2 hyperintensities in the cerebral white matter bilaterally have progressed and are nonspecific but compatible with moderately age advanced chronic small vessel ischemic disease given the patient's vascular risk factors. There are chronic lacunar infarcts in the deep cerebral white matter bilaterally. The ventricles and sulci are within normal limits for age. No mass, midline shift, or extra-axial fluid collection is identified. Vascular: Major intracranial vascular flow voids are preserved. Skull and upper cervical spine: Unremarkable bone marrow signal. Sinuses/Orbits: Unremarkable orbits. Paranasal sinuses and mastoid air cells are clear. Other: None. IMPRESSION: 1. Small acute to early subacute infarct in the right thalamus/posterior limb of internal capsule. 2. Small subacute left occipital lobe infarct. 3. Moderately advanced chronic small vessel ischemic disease, progressed from 2018. 4. Numerous chronic microhemorrhages consistent with chronic hypertension. Electronically Signed   By: Logan Bores M.D.   On: 02/07/2021 13:28   MR BRAIN W CONTRAST  Result Date: 02/10/2021 CLINICAL DATA:  Initial evaluation for neuro deficit, stroke suspected. EXAM: MRI HEAD WITH CONTRAST TECHNIQUE: Multiplanar, multiecho pulse sequences of the brain and surrounding  structures were obtained with intravenous contrast. CONTRAST:  71m GADAVIST GADOBUTROL 1 MMOL/ML IV SOLN  COMPARISON:  Comparison made with recent noncontrast brain MRI from 02/07/2021. FINDINGS: Brain: Age-related cerebral atrophy with moderately advanced chronic microvascular ischemic disease again noted. Few scattered remote lacunar infarcts noted as well. Findings seen and characterized on recent brain MRI from 02/07/2021. Following contrast administration, there is mild patchy post-contrast enhancement about the recently identified right thalamic capsular infarct (series 7, image 30). Additional subtle patchy enhancement about the previously identified left occipital infarct as well (series 9, image 17). Findings in keeping with probable early subacute ischemic infarcts. No other abnormal or pathologic enhancement within the brain. No other mass lesion, midline shift, or mass effect. No hydrocephalus or extra-axial fluid collection. No other new or progressive finding on this limited exam. Vascular: Normal intravascular enhancement seen throughout the major intracranial vascular structures. Skull and upper cervical spine: Craniocervical junction within normal limits. Bone marrow signal intensity normal. No scalp soft tissue abnormality. Sinuses/Orbits: Globes and orbital soft tissues within normal limits. Paranasal sinuses are largely clear. No mastoid effusion. Other: None. IMPRESSION: 1. Patchy post-contrast enhancement about the recently identified right thalamocapsular and left occipital infarcts, in keeping with early subacute ischemic infarcts. No associated mass effect. 2. Underlying age-related cerebral atrophy with moderately advanced chronic microvascular ischemic disease. No other new intracranial abnormality on this limited postcontrast only exam. Electronically Signed   By: Jeannine Boga M.D.   On: 02/10/2021 00:02   MR LUMBAR SPINE WO CONTRAST  Result Date: 02/10/2021 CLINICAL DATA:   Vigil take a shin seen on cardiac echo. New onset back pain. Question epidural abscess. EXAM: MRI LUMBAR SPINE WITHOUT CONTRAST TECHNIQUE: Multiplanar, multisequence MR imaging of the lumbar spine was performed. No intravenous contrast was administered. COMPARISON:  None. FINDINGS: Segmentation:  5 lumbar type vertebral bodies. Alignment: Scoliotic curvature convex to the left with the apex at L3. Vertebrae: No evidence of fracture. No sign of bone or joint infection. No sign of infectious discitis. Benign appearing fat within the L1 vertebral body. This study was ordered and performed without contrast. Conus medullaris and cauda equina: Conus extends to the L1 level. Conus and cauda equina appear normal. I do not see any finding to suggest epidural infection. Contrast administration would increase sensitivity to early disease. Paraspinal and other soft tissues: Negative Disc levels: T12-L1 and L1-2: Normal L2-3: Mild bulging of the disc.  No compressive stenosis. L3-4: Moderate bulging of the disc. Mild narrowing of both lateral recesses. No compressive stenosis. L4-5: Mild bulging of the disc.  No compressive stenosis. L5-S1: Endplate osteophytes and shallow left posterolateral disc herniation. Facet and ligamentous hypertrophy. Narrowing of the subarticular lateral recess on the left that could possibly affect the left S1 nerve. IMPRESSION: 1. No sign of epidural infection or other spinal infection in the lumbar region. This study was ordered without contrast. Contrast administration would increase sensitivity to early disease. 2. Lumbar degenerative changes as outlined above. Left posterolateral disc herniation at L5-S1 with narrowing of the subarticular lateral recess on the left that could possibly affect the left S1 nerve. Electronically Signed   By: Nelson Chimes M.D.   On: 02/10/2021 13:05   MR HIP LEFT WO CONTRAST  Result Date: 02/10/2021 CLINICAL DATA:  Left hip pain. Heart valve vegetation. Concern  for joint infection EXAM: MR OF THE LEFT HIP WITHOUT CONTRAST TECHNIQUE: Multiplanar, multisequence MR imaging was performed. No intravenous contrast was administered. COMPARISON:  09/08/2020 FINDINGS: Bones: No acute fracture. No dislocation. No femoral head avascular necrosis. No bony erosion or periostitis. Bony pelvis intact. SI  joints and pubic symphysis within normal limits. Mild right hip osteoarthritis with associated labral degeneration. No right hip joint effusion. Degenerative disc disease of L5-S1. Articular cartilage and labrum Articular cartilage: Mild chondral thinning and surface irregularity without focal defect. Labrum: Superior labral degeneration and tearing. 5 mm paralabral cyst along the anteroinferior margin of the labrum. Joint or bursal effusion Joint effusion:  None. Bursae: No bursal fluid collections. Muscles and tendons Muscles and tendons: Mild tendinosis of the bilateral hamstring tendon origins. The gluteal, iliopsoas, rectus femoris, and adductor tendons appear intact without tear or significant tendinosis. Normal muscle bulk and signal intensity without edema, atrophy, or fatty infiltration. Other findings Miscellaneous: No soft tissue edema or fluid collection. No inguinal lymphadenopathy. No acute findings within the pelvis. IMPRESSION: 1. No acute findings. Specifically, no evidence of septic arthritis or osteomyelitis. 2. Mild bilateral hip osteoarthritis with superior labral degeneration. 3. Mild bilateral hamstring tendinosis. Electronically Signed   By: Davina Poke D.O.   On: 02/10/2021 13:14   ECHOCARDIOGRAM COMPLETE  Result Date: 02/08/2021    ECHOCARDIOGRAM REPORT   Patient Name:   AUNDRAYA DRIPPS Date of Exam: 02/08/2021 Medical Rec #:  [id number]          Height:       65.0 in Accession #:    [id number]         Weight:       182.0 lb Date of Birth:  05-10-1961          BSA:          1.900 m Patient Age:    23 years           BP:           131/70 mmHg Patient  Gender: F                  HR:           106 bpm. Exam Location:  Inpatient Procedure: 2D Echo, 3D Echo, Cardiac Doppler, Color Doppler and Intracardiac            Opacification Agent Indications:    Stroke  History:        Patient has no prior history of Echocardiogram examinations.                 Stroke, Signs/Symptoms:Altered Mental Status; Risk                 Factors:Diabetes.  Sonographer:    Roseanna Rainbow RDCS Referring Phys: [id number] ANAND D HONGALGI  Sonographer Comments: Technically difficult study due to poor echo windows. Patient moving throughout test due to leg pain. IMPRESSIONS  1. Left ventricular ejection fraction, by estimation, is 40 to 45%. The left ventricle has mildly decreased function. The left ventricle demonstrates global hypokinesis. There is moderate concentric left ventricular hypertrophy. Indeterminate diastolic filling due to E-A fusion.  2. Right ventricular systolic function is normal. The right ventricular size is normal. Tricuspid regurgitation signal is inadequate for assessing PA pressure.  3. The mitral valve is grossly normal. Trivial mitral valve regurgitation. No evidence of mitral stenosis.  4. The aortic valve is tricuspid. Aortic valve regurgitation is not visualized. No aortic stenosis is present.  5. The inferior vena cava is normal in size with greater than 50% respiratory variability, suggesting right atrial pressure of 3 mmHg. Conclusion(s)/Recommendation(s): No intracardiac source of embolism detected on this transthoracic study. A transesophageal echocardiogram is recommended to exclude cardiac source of embolism if clinically indicated. No  left ventricular mural or apical thrombus/thrombi. FINDINGS  Left Ventricle: Left ventricular ejection fraction, by estimation, is 40 to 45%. The left ventricle has mildly decreased function. The left ventricle demonstrates global hypokinesis. Definity contrast agent was given IV to delineate the left ventricular  endocardial borders.  The left ventricular internal cavity size was normal in size. There is moderate concentric left ventricular hypertrophy. Indeterminate diastolic filling due to E-A fusion. Right Ventricle: The right ventricular size is normal. No increase in right ventricular wall thickness. Right ventricular systolic function is normal. Tricuspid regurgitation signal is inadequate for assessing PA pressure. Left Atrium: Left atrial size was normal in size. Right Atrium: Right atrial size was normal in size. Pericardium: Trivial pericardial effusion is present. Presence of pericardial fat pad. Mitral Valve: The mitral valve is grossly normal. There is mild thickening of the mitral valve leaflet(s). There is mild calcification of the anterior and posterior mitral valve leaflet(s). Mild mitral annular calcification. Trivial mitral valve regurgitation. No evidence of mitral valve stenosis. Tricuspid Valve: The tricuspid valve is grossly normal. Tricuspid valve regurgitation is trivial. No evidence of tricuspid stenosis. Aortic Valve: The aortic valve is tricuspid. Aortic valve regurgitation is not visualized. No aortic stenosis is present. Pulmonic Valve: The pulmonic valve was grossly normal. Pulmonic valve regurgitation is not visualized. No evidence of pulmonic stenosis. Aorta: The aortic root and ascending aorta are structurally normal, with no evidence of dilitation. Venous: The inferior vena cava is normal in size with greater than 50% respiratory variability, suggesting right atrial pressure of 3 mmHg. IAS/Shunts: The atrial septum is grossly normal.  LEFT VENTRICLE PLAX 2D LVIDd:         4.50 cm      Diastology LVIDs:         3.60 cm      LV e' medial:    7.62 cm/s LV PW:         1.35 cm      LV E/e' medial:  14.4 LV IVS:        1.42 cm      LV e' lateral:   5.11 cm/s                             LV E/e' lateral: 21.5  LV Volumes (MOD) LV vol d, MOD A2C: 118.0 ml LV vol d, MOD A4C: 117.0 ml LV vol s, MOD A2C: 66.7 ml LV vol s,  MOD A4C: 69.4 ml LV SV MOD A2C:     51.3 ml LV SV MOD A4C:     117.0 ml LV SV MOD BP:      49.1 ml RIGHT VENTRICLE            IVC RV S prime:     9.57 cm/s  IVC diam: 1.50 cm TAPSE (M-mode): 1.8 cm LEFT ATRIUM             Index       RIGHT ATRIUM          Index LA diam:        2.90 cm 1.53 cm/m  RA Area:     7.36 cm LA Vol (A2C):   47.5 ml 24.99 ml/m RA Volume:   13.60 ml 7.16 ml/m LA Vol (A4C):   20.2 ml 10.63 ml/m LA Biplane Vol: 31.7 ml 16.68 ml/m  AORTIC VALVE LVOT Vmax:   93.80 cm/s LVOT Vmean:  68.900 cm/s LVOT VTI:    0.157  m  AORTA Ao Root diam: 2.80 cm Ao Asc diam:  3.50 cm MITRAL VALVE MV Area (PHT): 4.49 cm     SHUNTS MV Decel Time: 169 msec     Systemic VTI: 0.16 m MV E velocity: 110.00 cm/s MV A velocity: 61.60 cm/s MV E/A ratio:  1.79 Eleonore Chiquito MD Electronically signed by Eleonore Chiquito MD Signature Date/Time: 02/08/2021/10:55:01 AM    Final    ECHO TEE  Result Date: 02/10/2021    TRANSESOPHOGEAL ECHO REPORT   Patient Name:   Benita Gutter Date of Exam: 02/10/2021 Medical Rec #:  [id number]          Height:       65.0 in Accession #:    [id number]         Weight:       182.0 lb Date of Birth:  Mar 23, 1961          BSA:          1.900 m Patient Age:    2 years           BP:           171/108 mmHg Patient Gender: F                  HR:           105 bpm. Exam Location:  Inpatient Procedure: Transesophageal Echo Indications:     Stroke  History:         Patient has prior history of Echocardiogram examinations, most                  recent 02/08/2021. Risk Factors:Hypertension. Acute CVA.  Sonographer:     Clayton Lefort RDCS (AE) Referring Phys:  [place] Diagnosing Phys: Dixie Dials MD PROCEDURE: After discussion of the risks and benefits of a TEE, an informed consent was obtained from the patient. The transesophogeal probe was passed without difficulty through the esophogus of the patient. Sedation performed by different physician. The patient was monitored while under deep  sedation. Anesthestetic sedation was provided intravenously by Anesthesiology: 222.55m of Propofol, 654mof Lidocaine. Image quality was good. The patient developed no complications during the procedure. IMPRESSIONS  1. Left ventricular ejection fraction, by estimation, is 45 to 50%. The left ventricle has mildly decreased function. The left ventricle demonstrates global hypokinesis. Left ventricular diastolic function could not be evaluated.  2. Right ventricular systolic function is normal. The right ventricular size is normal.  3. Left atrial size was moderately dilated. No left atrial/left atrial appendage thrombus was detected.  4. Right atrial size was mildly dilated.  5. The pericardial effusion is posterior to the left ventricle. There is no evidence of cardiac tamponade.  6. Mobile small MV vegetations seen on both leaflets. The mitral valve is normal in structure. Moderate mitral valve regurgitation.  7. Sessile cauliflower shaped vegetation measuring 0.428 x 0.712 cm on anterior leaflet mid portion of TV.  8. The aortic valve is tricuspid. Aortic valve regurgitation is not visualized. No aortic stenosis is present.  9. There is mild (Grade II) atheroma plaque involving the descending aorta and ascending aorta. 10. The inferior vena cava is normal in size with greater than 50% respiratory variability, suggesting right atrial pressure of 3 mmHg. 11. Evidence of atrial level shunting detected by color flow Doppler. Agitated saline contrast bubble study was negative, with no evidence of any interatrial shunt. There is a small patent foramen ovale with predominantly right to  left shunting across the atrial septum. FINDINGS  Left Ventricle: Left ventricular ejection fraction, by estimation, is 45 to 50%. The left ventricle has mildly decreased function. The left ventricle demonstrates global hypokinesis. The left ventricular internal cavity size was normal in size. There is  borderline concentric left  ventricular hypertrophy. Left ventricular diastolic function could not be evaluated. Right Ventricle: The right ventricular size is normal. No increase in right ventricular wall thickness. Right ventricular systolic function is normal. Left Atrium: Left atrial size was moderately dilated. No left atrial/left atrial appendage thrombus was detected. Right Atrium: Right atrial size was mildly dilated. Pericardium: Trivial pericardial effusion is present. The pericardial effusion is posterior to the left ventricle. There is no evidence of cardiac tamponade. Mitral Valve: Mobile small MV vegetations seen on both leaflets. The mitral valve is normal in structure. Normal mobility of the mitral valve leaflets. Moderate mitral valve regurgitation, with centrally-directed jet. Tricuspid Valve: Sessile cauliflower shaped vegetation measuring 0.428 x 0.712 cm on anterior leaflet mid portion of TV. The tricuspid valve is normal in structure. Tricuspid valve regurgitation is mild . No evidence of tricuspid stenosis. Aortic Valve: The aortic valve is tricuspid. Aortic valve regurgitation is not visualized. No aortic stenosis is present. Aortic valve mean gradient measures 3.0 mmHg. Aortic valve peak gradient measures 4.8 mmHg. Pulmonic Valve: The pulmonic valve was normal in structure. Pulmonic valve regurgitation is trivial. Aorta: The aortic root is normal in size and structure. There is mild (Grade II) atheroma plaque involving the descending aorta and ascending aorta. Venous: The left lower pulmonary vein and right lower pulmonary vein are normal. The inferior vena cava is normal in size with greater than 50% respiratory variability, suggesting right atrial pressure of 3 mmHg. IAS/Shunts: The interatrial septum appears to be lipomatous. Evidence of atrial level shunting detected by color flow Doppler. Agitated saline contrast was given intravenously to evaluate for intracardiac shunting. Agitated saline contrast bubble study  was negative, with no evidence of any interatrial shunt. A small patent foramen ovale is detected with predominantly right to left shunting across the atrial septum.  AORTIC VALVE AV Vmax:      110.00 cm/s AV Vmean:     78.800 cm/s AV VTI:       0.176 m AV Peak Grad: 4.8 mmHg AV Mean Grad: 3.0 mmHg Dixie Dials MD Electronically signed by Dixie Dials MD Signature Date/Time: 02/10/2021/11:02:32 AM    Final    VAS US CAROTID  Result Date: 02/10/2021 Carotid Arterial Duplex Study Indications:       CVA. Risk Factors:      Hypertension, hyperlipidemia, Diabetes, prior CVA. Comparison Study:  03-28-2017 Prior carotid duplex showed 1-39% stenosis of the                    ICA bilaterally. Performing Technologist: Darlin Coco RDMS,RVT  Examination Guidelines: A complete evaluation includes B-mode imaging, spectral Doppler, color Doppler, and power Doppler as needed of all accessible portions of each vessel. Bilateral testing is considered an integral part of a complete examination. Limited examinations for reoccurring indications may be performed as noted.  Right Carotid Findings: +----------+--------+--------+--------+------------------+--------+           PSV cm/sEDV cm/sStenosisPlaque DescriptionComments +----------+--------+--------+--------+------------------+--------+ CCA Prox  61      10                                         +----------+--------+--------+--------+------------------+--------+  CCA Distal67      13                                         +----------+--------+--------+--------+------------------+--------+ ICA Prox  52      23      1-39%   heterogenous               +----------+--------+--------+--------+------------------+--------+ ICA Distal72      30                                         +----------+--------+--------+--------+------------------+--------+ ECA       69      8                                           +----------+--------+--------+--------+------------------+--------+ +----------+--------+-------+----------------+-------------------+           PSV cm/sEDV cmsDescribe        Arm Pressure (mmHG) +----------+--------+-------+----------------+-------------------+ Subclavian119            Multiphasic, WNL                    +----------+--------+-------+----------------+-------------------+ +---------+--------+--+--------+--+---------+ VertebralPSV cm/s43EDV cm/s11Antegrade +---------+--------+--+--------+--+---------+  Left Carotid Findings: +----------+--------+--------+--------+------------------+--------+           PSV cm/sEDV cm/sStenosisPlaque DescriptionComments +----------+--------+--------+--------+------------------+--------+ CCA Prox  97      17                                         +----------+--------+--------+--------+------------------+--------+ CCA Distal65      16                                         +----------+--------+--------+--------+------------------+--------+ ICA Prox  54      27      1-39%   heterogenous               +----------+--------+--------+--------+------------------+--------+ ICA Distal77      37                                         +----------+--------+--------+--------+------------------+--------+ ECA       91      9                                          +----------+--------+--------+--------+------------------+--------+ +----------+--------+--------+----------------+-------------------+           PSV cm/sEDV cm/sDescribe        Arm Pressure (mmHG) +----------+--------+--------+----------------+-------------------+ Subclavian129             Multiphasic, WNL                    +----------+--------+--------+----------------+-------------------+ +---------+--------+--+--------+--+---------+ VertebralPSV cm/s53EDV cm/s23Antegrade +---------+--------+--+--------+--+---------+   Summary: Right  Carotid: Velocities in the right ICA are consistent with a 1-39% stenosis. Left Carotid: Velocities in the left  ICA are consistent with a 1-39% stenosis. Vertebrals:  Bilateral vertebral arteries demonstrate antegrade flow. Subclavians: Normal flow hemodynamics were seen in bilateral subclavian              arteries. *See table(s) above for measurements and observations.  Electronically signed by Antony Contras MD on 02/10/2021 at 12:50:53 PM.    Final    [place] EKG SITE RITE  Result Date: 02/12/2021 If Site Rite image not attached, placement could not be confirmed due to current cardiac rhythm.   (Echo, Carotid, EGD, Colonoscopy, ERCP)    Subjective: Patient seen and examined.  Husband at the bedside.  She slept all morning after getting a dose of oxycodone.  Does not want to do it again.  Currently pain is controlled at about 5 out of 10.  Walking around the room.  Eager to go home.  Has mild sensory loss on the left side.   Discharge Exam: Vitals:   02/11/21 2111 02/12/21 0831  BP: (!) 147/83 (!) 154/82  Pulse: 100 (!) 101  Resp: 16 16  Temp: 97.8 F (36.6 C) 99 F (37.2 C)  SpO2: 97% 100%   Vitals:   02/11/21 0402 02/11/21 0732 02/11/21 2111 02/12/21 0831  BP: (!) 145/87 (!) 147/83 (!) 147/83 (!) 154/82  Pulse: 91 96 100 (!) 101  Resp: _0 Temp:  98.6 F (37 C) 97.8 F (36.6 C) 99 F (37.2 C)  TempSrc:  Oral Oral Oral  SpO2: 98% 98% 97% 100%  Weight:      Height:        General: Pt is alert, awake, not in acute distress Walking around the hallway.  Looks comfortable. PICC line left arm. Cardiovascular: RRR, S1/S2 +, no rubs, no gallops Respiratory: CTA bilaterally, no wheezing, no rhonchi Abdominal: Soft, NT, ND, bowel sounds + Extremities: no edema, no cyanosis Neurological exam: No cranial nerve deficits.  She has mild sensory loss superficial sensation on the left upper and lower distal limb, motor is bilaterally equal.    The results of significant  diagnostics from this hospitalization (including imaging, microbiology, ancillary and laboratory) are listed below for reference.     Microbiology: Recent Results (from the past 240 hour(s))  Resp Panel by RT-PCR (Flu A&B, Covid) Nasopharyngeal Swab     Status: None   Collection Time: 02/07/21  3:00 PM   Specimen: Nasopharyngeal Swab; Nasopharyngeal(NP) swabs in vial transport medium  Result Value Ref Range Status   SARS Coronavirus 2 by RT PCR NEGATIVE NEGATIVE Final    Comment: (NOTE) SARS-CoV-2 target nucleic acids are NOT DETECTED.  The SARS-CoV-2 RNA is generally detectable in upper respiratory specimens during the acute phase of infection. The lowest concentration of SARS-CoV-2 viral copies this assay can detect is 138 copies/mL. A negative result does not preclude SARS-Cov-2 infection and should not be used as the sole basis for treatment or other patient management decisions. A negative result may occur with  improper specimen collection/handling, submission of specimen other than nasopharyngeal swab, presence of viral mutation(s) within the areas targeted by this assay, and inadequate number of viral copies(<138 copies/mL). A negative result must be combined with clinical observations, patient history, and epidemiological information. The expected result is Negative.  Fact Sheet for Patients:  EntrepreneurPulse.com.au  Fact Sheet for Healthcare Providers:  IncredibleEmployment.be  This test is no t yet approved or cleared by the [place] FDA and  has been authorized for detection and/or diagnosis of SARS-CoV-2 by FDA under  an Emergency Use Authorization (EUA). This EUA will remain  in effect (meaning this test can be used) for the duration of the COVID-19 declaration under Section 564(b)(1) of the Act, 21 U.S.C.section 360bbb-3(b)(1), unless the authorization is terminated  or revoked sooner.       Influenza A by PCR NEGATIVE  NEGATIVE Final   Influenza B by PCR NEGATIVE NEGATIVE Final    Comment: (NOTE) The Xpert Xpress SARS-CoV-2/FLU/RSV plus assay is intended as an aid in the diagnosis of influenza from Nasopharyngeal swab specimens and should not be used as a sole basis for treatment. Nasal washings and aspirates are unacceptable for Xpert Xpress SARS-CoV-2/FLU/RSV testing.  Fact Sheet for Patients: EntrepreneurPulse.com.au  Fact Sheet for Healthcare Providers: IncredibleEmployment.be  This test is not yet approved or cleared by the [place] FDA and has been authorized for detection and/or diagnosis of SARS-CoV-2 by FDA under an Emergency Use Authorization (EUA). This EUA will remain in effect (meaning this test can be used) for the duration of the COVID-19 declaration under Section 564(b)(1) of the Act, 21 U.S.C. section 360bbb-3(b)(1), unless the authorization is terminated or revoked.  Performed at [facility], [place] [address] [place], [place] [address]   Culture, blood (Routine X 2) w Reflex to ID Panel     Status: None (Preliminary result)   Collection Time: 02/10/21  9:39 AM   Specimen: BLOOD RIGHT FOREARM  Result Value Ref Range Status   Specimen Description BLOOD RIGHT FOREARM  Final   Special Requests   Final    BOTTLES DRAWN AEROBIC ONLY Blood Culture results may not be optimal due to an inadequate volume of blood received in culture bottles   Culture   Final    NO GROWTH 2 DAYS Performed at [facility], [place] [address], [place], [place] [address]    Report Status PENDING  Incomplete  Culture, blood (routine x 2)     Status: None (Preliminary result)   Collection Time: 02/10/21  1:50 PM   Specimen: Peripheral; Blood  Result Value Ref Range Status   Specimen Description Peripheral  Final   Special Requests NONE  Final   Culture   Final    NO GROWTH 2 DAYS Performed at [facility], [place] [address]  [place], [place] [address]    Report Status PENDING  Incomplete  Culture, blood (routine x 2)     Status: None (Preliminary result)   Collection Time: 02/10/21  1:56 PM   Specimen: BLOOD  Result Value Ref Range Status   Specimen Description BLOOD RIGHT ANTECUBITAL  Final   Special Requests   Final    BOTTLES DRAWN AEROBIC AND ANAEROBIC Blood Culture adequate volume   Culture   Final    NO GROWTH 2 DAYS Performed at [facility], [place] [address], [place], [place] [address]    Report Status PENDING  Incomplete  Culture, blood (Routine X 2) w Reflex to ID Panel     Status: None (Preliminary result)   Collection Time: 02/11/21 10:03 AM   Specimen: BLOOD  Result Value Ref Range Status   Specimen Description BLOOD LEFT ANTECUBITAL  Final   Special Requests   Final    BOTTLES DRAWN AEROBIC AND ANAEROBIC Blood Culture adequate volume   Culture   Final    NO GROWTH 1 DAY Performed at [facility], [place] [address], [place], [place] [address]    Report Status PENDING  Incomplete  Culture, blood (  Routine X 2) w Reflex to ID Panel     Status: None (Preliminary result)   Collection Time: 02/11/21 10:10 AM   Specimen: BLOOD LEFT HAND  Result Value Ref Range Status   Specimen Description BLOOD LEFT HAND  Final   Special Requests   Final    BOTTLES DRAWN AEROBIC AND ANAEROBIC Blood Culture results may not be optimal due to an inadequate volume of blood received in culture bottles   Culture   Final    NO GROWTH 1 DAY Performed at [facility], [place] [address], [place], [place] [address]    Report Status PENDING  Incomplete     Labs: BNP (last 3 results) No results for input(s): BNP in the last 8760 hours. Basic Metabolic Panel: Recent Labs  Lab 02/07/21 1145  NA 139  K 3.4*  CL 105  CO2 25  GLUCOSE 298*  BUN 18  CREATININE 0.53  CALCIUM 9.2   Liver Function Tests: Recent Labs  Lab 02/07/21 1145  AST 16  ALT 21  ALKPHOS 71  BILITOT 0.8  PROT 6.8  ALBUMIN 3.8    No results for input(s): LIPASE, AMYLASE in the last 168 hours. No results for input(s): AMMONIA in the last 168 hours. CBC: Recent Labs  Lab 02/07/21 1145 02/10/21 0939  WBC 9.0 9.8  NEUTROABS 6.0 6.4  HGB 15.7* 15.6*  HCT 45.8 47.2*  MCV 86.1 87.1  PLT 301 262   Cardiac Enzymes: Recent Labs  Lab 02/08/21 0742  CKTOTAL 40   BNP: Invalid input(s): POCBNP CBG: Recent Labs  Lab 02/11/21 1538 02/11/21 2110 02/12/21 0618 02/12/21 1157 02/12/21 1609  GLUCAP 274* 338* 268* 281* 251*   D-Dimer No results for input(s): DDIMER in the last 72 hours. Hgb A1c No results for input(s): HGBA1C in the last 72 hours. Lipid Profile No results for input(s): CHOL, HDL, LDLCALC, TRIG, CHOLHDL, LDLDIRECT in the last 72 hours. Thyroid function studies No results for input(s): TSH, T4TOTAL, T3FREE, THYROIDAB in the last 72 hours.  Invalid input(s): FREET3 Anemia work up No results for input(s): VITAMINB12, FOLATE, FERRITIN, TIBC, IRON, RETICCTPCT in the last 72 hours. Urinalysis    Component Value Date/Time   COLORURINE YELLOW 02/07/2021 1357   APPEARANCEUR HAZY (A) 02/07/2021 1357   LABSPEC 1.026 02/07/2021 1357   PHURINE 5.0 02/07/2021 1357   GLUCOSEU 150 (A) 02/07/2021 1357   HGBUR NEGATIVE 02/07/2021 1357   BILIRUBINUR NEGATIVE 02/07/2021 1357   KETONESUR 5 (A) 02/07/2021 1357   PROTEINUR NEGATIVE 02/07/2021 1357   UROBILINOGEN 0.2 12/04/2011 2352   NITRITE NEGATIVE 02/07/2021 1357   LEUKOCYTESUR MODERATE (A) 02/07/2021 1357   Sepsis Labs Invalid input(s): PROCALCITONIN,  WBC,  LACTICIDVEN Microbiology Recent Results (from the past 240 hour(s))  Resp Panel by RT-PCR (Flu A&B, Covid) Nasopharyngeal Swab     Status: None   Collection Time: 02/07/21  3:00 PM   Specimen: Nasopharyngeal Swab; Nasopharyngeal(NP) swabs in vial transport medium  Result Value Ref Range Status   SARS Coronavirus 2 by RT PCR NEGATIVE NEGATIVE Final    Comment: (NOTE) SARS-CoV-2 target  nucleic acids are NOT DETECTED.  The SARS-CoV-2 RNA is generally detectable in upper respiratory specimens during the acute phase of infection. The lowest concentration of SARS-CoV-2 viral copies this assay can detect is 138 copies/mL. A negative result does not preclude SARS-Cov-2 infection and should not be used as the sole basis for treatment or other patient management decisions. A negative result may occur with  improper  specimen collection/handling, submission of specimen other than nasopharyngeal swab, presence of viral mutation(s) within the areas targeted by this assay, and inadequate number of viral copies(<138 copies/mL). A negative result must be combined with clinical observations, patient history, and epidemiological information. The expected result is Negative.  Fact Sheet for Patients:  EntrepreneurPulse.com.au  Fact Sheet for Healthcare Providers:  IncredibleEmployment.be  This test is no t yet approved or cleared by the [place] FDA and  has been authorized for detection and/or diagnosis of SARS-CoV-2 by FDA under an Emergency Use Authorization (EUA). This EUA will remain  in effect (meaning this test can be used) for the duration of the COVID-19 declaration under Section 564(b)(1) of the Act, 21 U.S.C.section 360bbb-3(b)(1), unless the authorization is terminated  or revoked sooner.       Influenza A by PCR NEGATIVE NEGATIVE Final   Influenza B by PCR NEGATIVE NEGATIVE Final    Comment: (NOTE) The Xpert Xpress SARS-CoV-2/FLU/RSV plus assay is intended as an aid in the diagnosis of influenza from Nasopharyngeal swab specimens and should not be used as a sole basis for treatment. Nasal washings and aspirates are unacceptable for Xpert Xpress SARS-CoV-2/FLU/RSV testing.  Fact Sheet for Patients: EntrepreneurPulse.com.au  Fact Sheet for Healthcare  Providers: IncredibleEmployment.be  This test is not yet approved or cleared by the [place] FDA and has been authorized for detection and/or diagnosis of SARS-CoV-2 by FDA under an Emergency Use Authorization (EUA). This EUA will remain in effect (meaning this test can be used) for the duration of the COVID-19 declaration under Section 564(b)(1) of the Act, 21 U.S.C. section 360bbb-3(b)(1), unless the authorization is terminated or revoked.  Performed at [facility], [place] [address], [place], [place] [address]   Culture, blood (Routine X 2) w Reflex to ID Panel     Status: None (Preliminary result)   Collection Time: 02/10/21  9:39 AM   Specimen: BLOOD RIGHT FOREARM  Result Value Ref Range Status   Specimen Description BLOOD RIGHT FOREARM  Final   Special Requests   Final    BOTTLES DRAWN AEROBIC ONLY Blood Culture results may not be optimal due to an inadequate volume of blood received in culture bottles   Culture   Final    NO GROWTH 2 DAYS Performed at [facility], [place] [address] [place], [place] [address]    Report Status PENDING  Incomplete  Culture, blood (routine x 2)     Status: None (Preliminary result)   Collection Time: 02/10/21  1:50 PM   Specimen: Peripheral; Blood  Result Value Ref Range Status   Specimen Description Peripheral  Final   Special Requests NONE  Final   Culture   Final    NO GROWTH 2 DAYS Performed at [facility], [place] [address], [place], [place] [address]    Report Status PENDING  Incomplete  Culture, blood (routine x 2)     Status: None (Preliminary result)   Collection Time: 02/10/21  1:56 PM   Specimen: BLOOD  Result Value Ref Range Status   Specimen Description BLOOD RIGHT ANTECUBITAL  Final   Special Requests   Final    BOTTLES DRAWN AEROBIC AND ANAEROBIC Blood Culture adequate volume   Culture   Final    NO GROWTH 2 DAYS Performed at [facility], [place] [address],  [place], [place] [address]    Report Status PENDING  Incomplete  Culture, blood (Routine X 2) w Reflex to ID Panel  Status: None (Preliminary result)   Collection Time: 02/11/21 10:03 AM   Specimen: BLOOD  Result Value Ref Range Status   Specimen Description BLOOD LEFT ANTECUBITAL  Final   Special Requests   Final    BOTTLES DRAWN AEROBIC AND ANAEROBIC Blood Culture adequate volume   Culture   Final    NO GROWTH 1 DAY Performed at [facility], [address] [place], [place] [address]    Report Status PENDING  Incomplete  Culture, blood (Routine X 2) w Reflex to ID Panel     Status: None (Preliminary result)   Collection Time: 02/11/21 10:10 AM   Specimen: BLOOD LEFT HAND  Result Value Ref Range Status   Specimen Description BLOOD LEFT HAND  Final   Special Requests   Final    BOTTLES DRAWN AEROBIC AND ANAEROBIC Blood Culture results may not be optimal due to an inadequate volume of blood received in culture bottles   Culture   Final    NO GROWTH 1 DAY Performed at [facility], [place] [address] [place], [place] [address]    Report Status PENDING  Incomplete     Time coordinating discharge:  50 minutes  SIGNED:   Barb Merino, MD  [facility] 02/12/2021, 5:01 PM

## 2021-02-12 NOTE — Progress Notes (Signed)
PT Cancellation Note  Patient Details Name: Sheila Guerrero MRN: [id number] DOB: Oct 13, 1961   Cancelled Treatment:    Reason Eval/Treat Not Completed: Other (comment) Attempt at 1104, patient sound asleep and not easily woken, husband requests later session. Re-attempted at 1531, patient currently in procedure for PICC line placement. Will re-attempt as time allows.   Rushil Kimbrell A. Gilford Rile PT, DPT Acute Rehabilitation Services Pager [phone] Office [phone]    Linna Hoff 02/12/2021, 3:32 PM

## 2021-02-12 NOTE — TOC Transition Note (Signed)
Transition of Care [facility]) - CM/SW Discharge Note   Patient Details  Name: Finnley Lewis MRN: [id number] Date of Birth: Aug 21, 1961  Transition of Care [facility]) CM/SW Contact:  Pollie Friar, RN Phone Number: 02/12/2021, 4:27 PM   Clinical Narrative:    Patient is discharging home with [facility] RN for IV abx through [place]. She will also have outpatient PT/OT/ST through [facility] No DME needs.  Pt has supervision at home and transportation to home.    Final next level of care: [place] Barriers to Discharge: No Barriers Identified   Patient Goals and CMS Choice Patient states their goals for this hospitalization and ongoing recovery are:: to go home CMS Medicare.gov Compare Post Acute Care list provided to:: Patient Choice offered to / list presented to : Spouse,Patient  Discharge Placement                       Discharge Plan and Services   Discharge Planning Services: CM Consult                      HH Arranged: RN   Date [facility] Agency Contacted: 02/10/21   Representative spoke with at [place]: [place] through [place] [facility]) Interventions     Readmission Risk Interventions No flowsheet data found.

## 2021-02-12 NOTE — Progress Notes (Signed)
SLP Cancellation Note  Patient Details Name: Sheila Guerrero MRN: [id number] DOB: 1961-08-25   Cancelled treatment:       Reason Eval/Treat Not Completed: Patient at procedure or test/unavailable; will continue efforts    Hayden Rasmussen MA, CCC-SLP Acute Rehabilitation Services   02/12/2021, 3:11 PM

## 2021-02-12 NOTE — Progress Notes (Signed)
PROGRESS NOTE    Sheila Guerrero  YTK:[id number] DOB: December 01, 1960 DOA: 02/07/2021 PCP: Donald Prose, MD    Brief Narrative:  60 year old female with history of hypertension, hyperlipidemia, type 2 diabetes on insulin, anxiety and depression, stroke in 2017 with no neurological deficits presented to the ER with 4 weeks of left-sided weakness and numbness.  She also has history of neuropathy.  Patient was having outpatient work-up, she was noted to have significant weakness on the left side so sent to ER.  MRI shows acute/subacute stroke. 3/15, underwent TEE and surprisingly found to have mitral valve and tricuspid valve endocarditis with no evidence of systemic infection.  Neurologically improving.  Patient has more of the right hip pain that is worrisome for her.  Actually, family tells me that they came to hospital because of significant right hip pain. Stroke work-up in progress.  Echocardiogram consistent with both MV and TV endocarditis.  Assessment & Plan:   Principal Problem:   Subacute endocarditis Active Problems:   Type 2 diabetes mellitus with hyperlipidemia (HCC)   Paresthesia   Right thalamic infarction [facility])   Essential hypertension   Acute ischemic stroke (HCC)   Malnutrition of moderate degree   Septic embolism (HCC)  Acute/subacute ischemic stroke right MCA: Left-sided sensory deficit with mild ataxia that is mostly improving. MRI of the brain, small acute right thalamus/posterior limb of internal capsule infarct.  Left occipital lobe infarct. MRA of the head, Severe right M1, moderate right V4 stenosis.  No large vessel occlusion. 2D echocardiogram, EF 40 to 45% with global hypokinesis.  Moderate concentric LVH.  No source of embolism. Carotid duplex, 1 to 39% stenosis. LDL 55, on goal. A1c, 12.1.  Very high.  Only on Metformin.  Will continue Metformin.  She will start Levemir 20 units at night. Intervention, dual antiplatelet therapy with aspirin and Plavix for 3  weeks due severe intracranial atherosclerotic disease.  High-dose statin with Lipitor 80 mg daily. Seen by PT OT.  Recommended home health PT OT.  Continue mobilizing with nursing supervision.  Mitral valve/tricuspid valve vegetation: Accidental finding of both right and left-sided vegetations on TEE exam.  Patient with some chronic symptoms of weakness and fatigue but no fever, recent infection or interventions. MRI of the pelvis, including both side of the hip without any evidence of infection or septic arthritis. MRI of the lumbar spine with no evidence of infections. Dental x-ray normal. Blood cultures 3/15 - so far.  Blood cultures 3/16 negative so far. Followed by infectious disease.  Planning 6 weeks of antibiotic therapy. PICC line today.  Outpatient antibiotic therapy.  Cardiomyopathy: Suspect hypertensive cardiomyopathy.  Lisinopril discontinued 3/13.  Is started on beta-blockers and Entresto.   Hypokalemia: Replaced.  Adequate.  Right hip pain /sciatica: On Lyrica.  Significant pain.  MRI negative.  Patient with significant pain.  Planning to go to a chiropractor.  Pain control remains a challenge.  Sedated after oxycodone.  Discussed in detail with patient and her husband.  She wants to try oral morphine and see if that can help with the pain control.  Local lidocaine patch.  Essential hypertension: Blood pressure stable.  Type 2 diabetes with hyperlipidemia: A1c is 12.  Holding Metformin.  Currently on insulin.  We will discharge her home on insulin.  Anxiety/depression: Stable.   DVT prophylaxis: enoxaparin (LOVENOX) injection 40 mg Start: 02/07/21 1830   Code Status: Full code Family Communication: Husband on the phone. Disposition Plan: Status is: Inpatient  Remains inpatient appropriate because:Inpatient level  of care appropriate due to severity of illness   Dispo: The patient is from: Home              Anticipated d/c is to: Home with home health.  Home  inpatient therapy.              Patient currently is medically stabilizing.  Will need PICC line and home infusion therapy set up.   Difficult to place patient No    Consultants:   Neurology  Cardiology  ID  Procedures:   None  Antimicrobials:   Daptomycin and ceftriaxone 2/15----   Subjective: Patient seen and examined.  When I went to examine her, she said she is tired and she closes her eyes and fall asleep.  No other overnight events.  Objective: Vitals:   02/11/21 0402 02/11/21 0732 02/11/21 2111 02/12/21 0831  BP: (!) 145/87 (!) 147/83 (!) 147/83 (!) 154/82  Pulse: 91 96 100 (!) 101  Resp: 15 16 16 16   Temp:  98.6 F (37 C) 97.8 F (36.6 C) 99 F (37.2 C)  TempSrc:  Oral Oral Oral  SpO2: 98% 98% 97% 100%  Weight:      Height:        Intake/Output Summary (Last 24 hours) at 02/12/2021 1329 Last data filed at 02/12/2021 0625 Gross per 24 hour  Intake 671.52 ml  Output -  Net 671.52 ml   Filed Weights   02/07/21 1047  Weight: 82.6 kg    Examination:  General: Looks comfortable.  Sleepy today. Cardiovascular: S1-S2 normal. Respiratory: Bilateral clear. Gastrointestinal: Soft.  Nontender.  Bowel sounds present. Ext: No cyanosis or edema. Neuro: Mild left hemiparesis. Musculoskeletal: No joint tenderness or swelling.    Data Reviewed: I have personally reviewed following labs and imaging studies  CBC: Recent Labs  Lab 02/07/21 1145 02/10/21 0939  WBC 9.0 9.8  NEUTROABS 6.0 6.4  HGB 15.7* 15.6*  HCT 45.8 47.2*  MCV 86.1 87.1  PLT 301 485   Basic Metabolic Panel: Recent Labs  Lab 02/07/21 1145  NA 139  K 3.4*  CL 105  CO2 25  GLUCOSE 298*  BUN 18  CREATININE 0.53  CALCIUM 9.2   GFR: Estimated Creatinine Clearance: 81.3 mL/min (by C-G formula based on SCr of 0.53 mg/dL). Liver Function Tests: Recent Labs  Lab 02/07/21 1145  AST 16  ALT 21  ALKPHOS 71  BILITOT 0.8  PROT 6.8  ALBUMIN 3.8   No results for input(s):  LIPASE, AMYLASE in the last 168 hours. No results for input(s): AMMONIA in the last 168 hours. Coagulation Profile: Recent Labs  Lab 02/07/21 1145  INR 1.0   Cardiac Enzymes: Recent Labs  Lab 02/08/21 0742  CKTOTAL 40   BNP (last 3 results) No results for input(s): PROBNP in the last 8760 hours. HbA1C: Recent Labs    02/09/21 1523  HGBA1C 12.1*   CBG: Recent Labs  Lab 02/11/21 1141 02/11/21 1538 02/11/21 2110 02/12/21 0618 02/12/21 1157  GLUCAP 328* 274* 338* 268* 281*   Lipid Profile: No results for input(s): CHOL, HDL, LDLCALC, TRIG, CHOLHDL, LDLDIRECT in the last 72 hours. Thyroid Function Tests: No results for input(s): TSH, T4TOTAL, FREET4, T3FREE, THYROIDAB in the last 72 hours. Anemia Panel: No results for input(s): VITAMINB12, FOLATE, FERRITIN, TIBC, IRON, RETICCTPCT in the last 72 hours. Sepsis Labs: No results for input(s): PROCALCITON, LATICACIDVEN in the last 168 hours.  Recent Results (from the past 240 hour(s))  Resp Panel by RT-PCR (Flu A&B,  Covid) Nasopharyngeal Swab     Status: None   Collection Time: 02/07/21  3:00 PM   Specimen: Nasopharyngeal Swab; Nasopharyngeal(NP) swabs in vial transport medium  Result Value Ref Range Status   SARS Coronavirus 2 by RT PCR NEGATIVE NEGATIVE Final    Comment: (NOTE) SARS-CoV-2 target nucleic acids are NOT DETECTED.  The SARS-CoV-2 RNA is generally detectable in upper respiratory specimens during the acute phase of infection. The lowest concentration of SARS-CoV-2 viral copies this assay can detect is 138 copies/mL. A negative result does not preclude SARS-Cov-2 infection and should not be used as the sole basis for treatment or other patient management decisions. A negative result may occur with  improper specimen collection/handling, submission of specimen other than nasopharyngeal swab, presence of viral mutation(s) within the areas targeted by this assay, and inadequate number of viral copies(<138  copies/mL). A negative result must be combined with clinical observations, patient history, and epidemiological information. The expected result is Negative.  Fact Sheet for Patients:  EntrepreneurPulse.com.au  Fact Sheet for Healthcare Providers:  IncredibleEmployment.be  This test is no t yet approved or cleared by the [place] FDA and  has been authorized for detection and/or diagnosis of SARS-CoV-2 by FDA under an Emergency Use Authorization (EUA). This EUA will remain  in effect (meaning this test can be used) for the duration of the COVID-19 declaration under Section 564(b)(1) of the Act, 21 U.S.C.section 360bbb-3(b)(1), unless the authorization is terminated  or revoked sooner.       Influenza A by PCR NEGATIVE NEGATIVE Final   Influenza B by PCR NEGATIVE NEGATIVE Final    Comment: (NOTE) The Xpert Xpress SARS-CoV-2/FLU/RSV plus assay is intended as an aid in the diagnosis of influenza from Nasopharyngeal swab specimens and should not be used as a sole basis for treatment. Nasal washings and aspirates are unacceptable for Xpert Xpress SARS-CoV-2/FLU/RSV testing.  Fact Sheet for Patients: EntrepreneurPulse.com.au  Fact Sheet for Healthcare Providers: IncredibleEmployment.be  This test is not yet approved or cleared by the [place] FDA and has been authorized for detection and/or diagnosis of SARS-CoV-2 by FDA under an Emergency Use Authorization (EUA). This EUA will remain in effect (meaning this test can be used) for the duration of the COVID-19 declaration under Section 564(b)(1) of the Act, 21 U.S.C. section 360bbb-3(b)(1), unless the authorization is terminated or revoked.  Performed at [facility], [place] [address], [place], [place] [address]   Culture, blood (Routine X 2) w Reflex to ID Panel     Status: None (Preliminary result)   Collection Time: 02/10/21   9:39 AM   Specimen: BLOOD RIGHT FOREARM  Result Value Ref Range Status   Specimen Description BLOOD RIGHT FOREARM  Final   Special Requests   Final    BOTTLES DRAWN AEROBIC ONLY Blood Culture results may not be optimal due to an inadequate volume of blood received in culture bottles   Culture   Final    NO GROWTH 2 DAYS Performed at [facility], [place] [address], [place], [place] [address]    Report Status PENDING  Incomplete  Culture, blood (routine x 2)     Status: None (Preliminary result)   Collection Time: 02/10/21  1:50 PM   Specimen: Peripheral; Blood  Result Value Ref Range Status   Specimen Description Peripheral  Final   Special Requests NONE  Final   Culture   Final    NO GROWTH 2 DAYS Performed at [facility],  [address] [place], [place] [address]    Report Status PENDING  Incomplete  Culture, blood (routine x 2)     Status: None (Preliminary result)   Collection Time: 02/10/21  1:56 PM   Specimen: BLOOD  Result Value Ref Range Status   Specimen Description BLOOD RIGHT ANTECUBITAL  Final   Special Requests   Final    BOTTLES DRAWN AEROBIC AND ANAEROBIC Blood Culture adequate volume   Culture   Final    NO GROWTH 2 DAYS Performed at [facility], [place] [address] [place] [address], [place], [place] [address]    Report Status PENDING  Incomplete  Culture, blood (Routine X 2) w Reflex to ID Panel     Status: None (Preliminary result)   Collection Time: 02/11/21 10:03 AM   Specimen: BLOOD  Result Value Ref Range Status   Specimen Description BLOOD LEFT ANTECUBITAL  Final   Special Requests   Final    BOTTLES DRAWN AEROBIC AND ANAEROBIC Blood Culture adequate volume   Culture   Final    NO GROWTH < 24 HOURS Performed at [facility], [place] [address] [place], [place] [address]    Report Status PENDING  Incomplete  Culture, blood (Routine X 2) w Reflex to ID Panel     Status: None (Preliminary result)   Collection Time: 02/11/21 10:10 AM   Specimen: BLOOD  LEFT HAND  Result Value Ref Range Status   Specimen Description BLOOD LEFT HAND  Final   Special Requests   Final    BOTTLES DRAWN AEROBIC AND ANAEROBIC Blood Culture results may not be optimal due to an inadequate volume of blood received in culture bottles   Culture   Final    NO GROWTH < 24 HOURS Performed at [facility], [place] [address], [place], [place] [address]    Report Status PENDING  Incomplete         Radiology Studies: DG Orthopantogram  Result Date: 02/11/2021 CLINICAL DATA:  Bacteremia EXAM: ORTHOPANTOGRAM/PANORAMIC COMPARISON:  None. FINDINGS: Several teeth are missing. Impacted third molar on the right superiorly. No fracture or dislocation. No erosive change or bony destruction. No dental abscess evident. No large carious lesions. IMPRESSION: Several teeth missing. Impacted superior right third molar. No erosive change or bony destruction. No dental abscess appreciable. No fracture or dislocation. Electronically Signed   By: Lowella Grip III M.D.   On: 02/11/2021 11:24   [place] EKG SITE RITE  Result Date: 02/12/2021 If Site Rite image not attached, placement could not be confirmed due to current cardiac rhythm.       Scheduled Meds: . aspirin EC  81 mg Oral Daily  . atorvastatin  80 mg Oral Daily  . carvedilol  3.125 mg Oral BID WC  . clopidogrel  75 mg Oral Daily  . docusate sodium  100 mg Oral Daily  . enoxaparin (LOVENOX) injection  40 mg Subcutaneous Q24H  . insulin aspart  0-15 Units Subcutaneous TID WC  . insulin aspart  0-5 Units Subcutaneous QHS  . insulin aspart  4 Units Subcutaneous TID WC  . insulin detemir  20 Units Subcutaneous Daily  . lidocaine  1 patch Transdermal Q24H  . multivitamin with minerals  1 tablet Oral Daily  . omega-3 acid ethyl esters  1 g Oral Daily  . pregabalin  150 mg Oral BID  . sacubitril-valsartan  1 tablet Oral BID  . topiramate  50 mg Oral BID  . traZODone  100 mg  Oral QHS   Continuous Infusions: .  cefTRIAXone (ROCEPHIN)  IV 2 g (02/12/21 1237)  . DAPTOmycin (CUBICIN)  IV Stopped (02/11/21 2116)     LOS: 4 days    Time spent: 30 minutes    Barb Merino, MD [facility] Pager [phone]

## 2021-02-12 NOTE — Progress Notes (Signed)
Subjective:  Patient with no new complaints.   Antibiotics:  Anti-infectives (From admission, onward)   Start     Dose/Rate Route Frequency Ordered Stop   02/12/21 0000  cefTRIAXone (ROCEPHIN) IVPB        2 g Intravenous Every 12 hours 02/12/21 1415 03/18/21 2359   02/12/21 0000  daptomycin (CUBICIN) IVPB        800 mg Intravenous Every 24 hours 02/12/21 1415 03/18/21 2359   02/10/21 2200  cefTRIAXone (ROCEPHIN) 2 g in sodium chloride 0.9 % 100 mL IVPB        2 g 200 mL/hr over 30 Minutes Intravenous Every 12 hours 02/10/21 1616     02/10/21 2000  DAPTOmycin (CUBICIN) 800 mg in sodium chloride 0.9 % IVPB        800 mg 232 mL/hr over 30 Minutes Intravenous Daily 02/10/21 1616        Medications: Scheduled Meds: . aspirin EC  81 mg Oral Daily  . atorvastatin  80 mg Oral Daily  . carvedilol  3.125 mg Oral BID WC  . clopidogrel  75 mg Oral Daily  . docusate sodium  100 mg Oral Daily  . enoxaparin (LOVENOX) injection  40 mg Subcutaneous Q24H  . insulin aspart  0-15 Units Subcutaneous TID WC  . insulin aspart  0-5 Units Subcutaneous QHS  . insulin aspart  4 Units Subcutaneous TID WC  . insulin detemir  20 Units Subcutaneous Daily  . lidocaine  1 patch Transdermal Q24H  . multivitamin with minerals  1 tablet Oral Daily  . omega-3 acid ethyl esters  1 g Oral Daily  . pregabalin  150 mg Oral BID  . sacubitril-valsartan  1 tablet Oral BID  . topiramate  50 mg Oral BID  . traZODone  100 mg Oral QHS   Continuous Infusions: . cefTRIAXone (ROCEPHIN)  IV 2 g (02/12/21 1237)  . DAPTOmycin (CUBICIN)  IV Stopped (02/11/21 2116)   PRN Meds:.acetaminophen **OR** acetaminophen (TYLENOL) oral liquid 160 mg/5 mL **OR** acetaminophen, cyclobenzaprine, EPINEPHrine, morphine    Objective: Weight change:   Intake/Output Summary (Last 24 hours) at 02/12/2021 1505 Last data filed at 02/12/2021 5462 Gross per 24 hour  Intake 671.52 ml  Output --  Net 671.52 ml   Blood  pressure (!) 154/82, pulse (!) 101, temperature 99 F (37.2 C), temperature source Oral, resp. rate 16, height 5\' 5"  (1.651 m), weight 82.6 kg, last menstrual period 11/25/2011, SpO2 100 %. Temp:  [97.8 F (36.6 C)-99 F (37.2 C)] 99 F (37.2 C) (03/17 0831) Pulse Rate:  [100-101] 101 (03/17 0831) Resp:  [16] 16 (03/17 0831) BP: (147-154)/(82-83) 154/82 (03/17 0831) SpO2:  [97 %-100 %] 100 % (03/17 0831)  Physical Exam: Physical Exam Constitutional:      General: She is not in acute distress.    Appearance: She is well-developed. She is not diaphoretic.  HENT:     Head: Normocephalic and atraumatic.     Right Ear: External ear normal.     Left Ear: External ear normal.     Mouth/Throat:     Pharynx: No oropharyngeal exudate.  Eyes:     General: No scleral icterus.    Conjunctiva/sclera: Conjunctivae normal.     Pupils: Pupils are equal, round, and reactive to light.  Cardiovascular:     Rate and Rhythm: Normal rate and regular rhythm.     Heart sounds: Normal heart sounds. No murmur heard. No friction rub. No gallop.  Pulmonary:     Effort: Pulmonary effort is normal. No respiratory distress.     Breath sounds: Normal breath sounds. No wheezing or rales.  Abdominal:     General: Bowel sounds are normal. There is no distension.     Palpations: Abdomen is soft.     Tenderness: There is no abdominal tenderness. There is no rebound.  Musculoskeletal:        General: No tenderness. Normal range of motion.  Lymphadenopathy:     Cervical: No cervical adenopathy.  Skin:    General: Skin is warm and dry.     Coloration: Skin is not pale.     Findings: No erythema or rash.  Neurological:     Mental Status: She is alert and oriented to person, place, and time.     Motor: No abnormal muscle tone.     Coordination: Coordination normal.     Comments: Left sided weakness  Psychiatric:        Mood and Affect: Mood normal.        Behavior: Behavior normal.        Thought Content:  Thought content normal.        Judgment: Judgment normal.      CBC:    BMET No results for input(s): NA, K, CL, CO2, GLUCOSE, BUN, CREATININE, CALCIUM in the last 72 hours.   Liver Panel  No results for input(s): PROT, ALBUMIN, AST, ALT, ALKPHOS, BILITOT, BILIDIR, IBILI in the last 72 hours.     Sedimentation Rate No results for input(s): ESRSEDRATE in the last 72 hours. C-Reactive Protein No results for input(s): CRP in the last 72 hours.  Micro Results: Recent Results (from the past 720 hour(s))  Resp Panel by RT-PCR (Flu A&B, Covid) Nasopharyngeal Swab     Status: None   Collection Time: 02/07/21  3:00 PM   Specimen: Nasopharyngeal Swab; Nasopharyngeal(NP) swabs in vial transport medium  Result Value Ref Range Status   SARS Coronavirus 2 by RT PCR NEGATIVE NEGATIVE Final    Comment: (NOTE) SARS-CoV-2 target nucleic acids are NOT DETECTED.  The SARS-CoV-2 RNA is generally detectable in upper respiratory specimens during the acute phase of infection. The lowest concentration of SARS-CoV-2 viral copies this assay can detect is 138 copies/mL. A negative result does not preclude SARS-Cov-2 infection and should not be used as the sole basis for treatment or other patient management decisions. A negative result may occur with  improper specimen collection/handling, submission of specimen other than nasopharyngeal swab, presence of viral mutation(s) within the areas targeted by this assay, and inadequate number of viral copies(<138 copies/mL). A negative result must be combined with clinical observations, patient history, and epidemiological information. The expected result is Negative.  Fact Sheet for Patients:  EntrepreneurPulse.com.au  Fact Sheet for Healthcare Providers:  IncredibleEmployment.be  This test is no t yet approved or cleared by the [place] FDA and  has been authorized for detection and/or diagnosis of  SARS-CoV-2 by FDA under an Emergency Use Authorization (EUA). This EUA will remain  in effect (meaning this test can be used) for the duration of the COVID-19 declaration under Section 564(b)(1) of the Act, 21 U.S.C.section 360bbb-3(b)(1), unless the authorization is terminated  or revoked sooner.       Influenza A by PCR NEGATIVE NEGATIVE Final   Influenza B by PCR NEGATIVE NEGATIVE Final    Comment: (NOTE) The Xpert Xpress SARS-CoV-2/FLU/RSV plus assay is intended as an aid in the diagnosis of influenza from Nasopharyngeal  swab specimens and should not be used as a sole basis for treatment. Nasal washings and aspirates are unacceptable for Xpert Xpress SARS-CoV-2/FLU/RSV testing.  Fact Sheet for Patients: EntrepreneurPulse.com.au  Fact Sheet for Healthcare Providers: IncredibleEmployment.be  This test is not yet approved or cleared by the [place] FDA and has been authorized for detection and/or diagnosis of SARS-CoV-2 by FDA under an Emergency Use Authorization (EUA). This EUA will remain in effect (meaning this test can be used) for the duration of the COVID-19 declaration under Section 564(b)(1) of the Act, 21 U.S.C. section 360bbb-3(b)(1), unless the authorization is terminated or revoked.  Performed at [facility], [place] [address], [place], [place] [address]   Culture, blood (Routine X 2) w Reflex to ID Panel     Status: None (Preliminary result)   Collection Time: 02/10/21  9:39 AM   Specimen: BLOOD RIGHT FOREARM  Result Value Ref Range Status   Specimen Description BLOOD RIGHT FOREARM  Final   Special Requests   Final    BOTTLES DRAWN AEROBIC ONLY Blood Culture results may not be optimal due to an inadequate volume of blood received in culture bottles   Culture   Final    NO GROWTH 2 DAYS Performed at [facility], [place] [address] [place], [place] [address]    Report Status PENDING  Incomplete   Culture, blood (routine x 2)     Status: None (Preliminary result)   Collection Time: 02/10/21  1:50 PM   Specimen: Peripheral; Blood  Result Value Ref Range Status   Specimen Description Peripheral  Final   Special Requests NONE  Final   Culture   Final    NO GROWTH 2 DAYS Performed at [facility], [place] [address], [place], [place] [address]    Report Status PENDING  Incomplete  Culture, blood (routine x 2)     Status: None (Preliminary result)   Collection Time: 02/10/21  1:56 PM   Specimen: BLOOD  Result Value Ref Range Status   Specimen Description BLOOD RIGHT ANTECUBITAL  Final   Special Requests   Final    BOTTLES DRAWN AEROBIC AND ANAEROBIC Blood Culture adequate volume   Culture   Final    NO GROWTH 2 DAYS Performed at [facility], [place] [address] [place], [place] [address]    Report Status PENDING  Incomplete  Culture, blood (Routine X 2) w Reflex to ID Panel     Status: None (Preliminary result)   Collection Time: 02/11/21 10:03 AM   Specimen: BLOOD  Result Value Ref Range Status   Specimen Description BLOOD LEFT ANTECUBITAL  Final   Special Requests   Final    BOTTLES DRAWN AEROBIC AND ANAEROBIC Blood Culture adequate volume   Culture   Final    NO GROWTH 1 DAY Performed at [facility], [place] [address], [place],  [address]    Report Status PENDING  Incomplete  Culture, blood (Routine X 2) w Reflex to ID Panel     Status: None (Preliminary result)   Collection Time: 02/11/21 10:10 AM   Specimen: BLOOD LEFT HAND  Result Value Ref Range Status   Specimen Description BLOOD LEFT HAND  Final   Special Requests   Final    BOTTLES DRAWN AEROBIC AND ANAEROBIC Blood Culture results may not be optimal due to an inadequate volume of blood received in culture bottles   Culture   Final    NO GROWTH 1  DAY Performed at [facility], [place] [address], [place], [place] [address]    Report Status PENDING  Incomplete    Studies/Results: DG  Orthopantogram  Result Date: 02/11/2021 CLINICAL DATA:  Bacteremia EXAM: ORTHOPANTOGRAM/PANORAMIC COMPARISON:  None. FINDINGS: Several teeth are missing. Impacted third molar on the right superiorly. No fracture or dislocation. No erosive change or bony destruction. No dental abscess evident. No large carious lesions. IMPRESSION: Several teeth missing. Impacted superior right third molar. No erosive change or bony destruction. No dental abscess appreciable. No fracture or dislocation. Electronically Signed   By: Lowella Grip III M.D.   On: 02/11/2021 11:24   [place] EKG SITE RITE  Result Date: 02/12/2021 If Site Rite image not attached, placement could not be confirmed due to current cardiac rhythm.     Assessment/Plan:  INTERVAL HISTORY:   Panorex unrevealing other than an impacted molar  Blood cultures taken before and after antibiotics remains without growth  Principal Problem:   Subacute endocarditis Active Problems:   Type 2 diabetes mellitus with hyperlipidemia (HCC)   Paresthesia   Right thalamic infarction ([place])   Essential hypertension   Acute ischemic stroke (HCC)   Malnutrition of moderate degree   Septic embolism (HCC)    Sheila Guerrero is a 60 y.o. female with what appears to have been subacute bacterial endocarditis with infection of bicuspid and mitral valve and septic embolization to the brain.  She also has some severe pain on her right side that radiates down her leg.  MRI was performed of her hip yesterday.  Note MRI is labeled left hip but I spoke with the radiologist who read the films and both hips were clearly visualized.  There is no evidence of infection of the hips which does not surprise me because her exam was not consistent with a hip infection.  MRI of the lumbar spine was also unrevealing.  It is possible does she could have a discitis that is just not visible on imaging at this point in time.  Subacute bacterial endocarditis:   --Likely to  be culture negative b  Hip and back pain: As mentioned could be a discitis that is not radiographically visible at this time.  I have placed order for PICC line and O. Pat order and scheduled her an appointment with me in the regional center for infectious disease   Karita Dralle has an appointment on 03/05/2021 at 52 am  The [facility] is located in the [facility] at  [address] in [place].  [address], which is located to the left of the elevators.  Phone: [phone]  Fax: [phone]  [email or web address]  She should arrive 15 minutes prior to her appointment and is welcome to come even earlier.  Her blood cultures should continue to come to me in my epic box.  I will sign off for now please call if further questions  I spent greater than 35 minutes with the patient including greater than 50% of time in face to face counsel of the patient regarding her endocarditis septic embolization and in coordination of her care with radiology and the primary team.       LOS: 4 days   Alcide Evener 02/12/2021, 3:05 PM

## 2021-02-12 NOTE — Progress Notes (Signed)
Patient D/C home, transported by husband, last dose of daptomyacin given, PICC rt upper arm left intact for long term abx treatment at home.

## 2021-02-12 NOTE — Plan of Care (Signed)
Adequate for discharge.

## 2021-02-12 NOTE — Progress Notes (Signed)
Inpatient Diabetes Program Recommendations  AACE/ADA: New Consensus Statement on Inpatient Glycemic Control (2015)  Target Ranges:  Prepandial:   less than 140 mg/dL      Peak postprandial:   less than 180 mg/dL (1-2 hours)      Critically ill patients:  140 - 180 mg/dL   Lab Results  Component Value Date   GLUCAP 268 (H) 02/12/2021   HGBA1C 12.1 (H) 02/09/2021    Review of Glycemic Control Results for Sheila Guerrero, Sheila "[name]" (MRN [id number]) as of 02/12/2021 11:27  Ref. Range 02/11/2021 06:18 02/11/2021 11:41 02/11/2021 15:38 02/11/2021 21:10 02/12/2021 06:18  Glucose-Capillary Latest Ref Range: 70 - 99 mg/dL 278 (H) 328 (H) 274 (H) 338 (H) 268 (H)  Diabetes history: DM 2 Outpatient Diabetes medications: Metformin 2000 mg Qevening at supper Current orders for Inpatient glycemic control:  Lantus 10 units Novolog 0-15 units tid + hs  Novolog 4 units tid meal coverage  Inpatient Diabetes Program Recommendations:    Please consider increasing basal insulin to Levemir 20 units daily (Levemir is preferred with patient's insurance).  Needs close f/u with PCP regarding DM and glycemic control.   Thanks,  Adah Perl, RN, BC-ADM Inpatient Diabetes Coordinator Pager [phone] (8a-5p)

## 2021-02-13 ENCOUNTER — Encounter (HOSPITAL_COMMUNITY): Payer: Self-pay | Admitting: Cardiovascular Disease

## 2021-02-16 LAB — CULTURE, BLOOD (ROUTINE X 2)
Culture: NO GROWTH
Culture: NO GROWTH
Special Requests: ADEQUATE

## 2021-02-17 LAB — CULTURE, BLOOD (ROUTINE X 2)
Culture: NO GROWTH
Culture: NO GROWTH
Culture: NO GROWTH
Special Requests: ADEQUATE

## 2021-02-18 ENCOUNTER — Ambulatory Visit: Payer: 59 | Admitting: Occupational Therapy

## 2021-02-18 ENCOUNTER — Ambulatory Visit: Payer: 59

## 2021-02-24 ENCOUNTER — Other Ambulatory Visit: Payer: Self-pay | Admitting: *Deleted

## 2021-02-24 NOTE — Patient Outreach (Signed)
[facility]) Care Management  02/24/2021  Sheila Guerrero 18-May-1961 [id number]   RED ON EMMI ALERT - Stroke Day # 9 Date: 3/28 Red Alert Reason: Feeling Worse   Outreach attempt #1, successful.  Identity verified.  This care manager introduced self and stated purpose of call.  [facility] care management services explained.    Member report she was discharged home on IV antibiotics for endocarditis, now having side effects from medications.  She has follow up with home health RN today, will discuss concerns and contact provider.  She has upcoming appointment with new PCP this coming Friday, also will be seen by neurology within the next couple weeks.  State she was told that she would need to follow up with cardiology but wasn't given the information for provider.  Contact information for Dr. Terrence Dupont provided, state she will call to schedule.  Denies any further needs at this time, no expressed need for follow up.   Plan: RN CM will send successful outreach letter with this care manager's contact information.  Will close case as no further needs identified.  Valente David, [place], MSN [place] [phone]

## 2021-02-25 ENCOUNTER — Ambulatory Visit: Payer: 59 | Attending: Internal Medicine | Admitting: Occupational Therapy

## 2021-02-25 ENCOUNTER — Encounter: Payer: Self-pay | Admitting: Physical Therapy

## 2021-02-25 ENCOUNTER — Encounter: Payer: Self-pay | Admitting: Speech Pathology

## 2021-02-25 ENCOUNTER — Ambulatory Visit: Payer: 59 | Admitting: Speech Pathology

## 2021-02-25 ENCOUNTER — Other Ambulatory Visit: Payer: Self-pay

## 2021-02-25 ENCOUNTER — Ambulatory Visit: Payer: 59 | Admitting: Physical Therapy

## 2021-02-25 DIAGNOSIS — R41841 Cognitive communication deficit: Secondary | ICD-10-CM | POA: Diagnosis present

## 2021-02-25 DIAGNOSIS — R41844 Frontal lobe and executive function deficit: Secondary | ICD-10-CM

## 2021-02-25 DIAGNOSIS — R2681 Unsteadiness on feet: Secondary | ICD-10-CM

## 2021-02-25 DIAGNOSIS — R278 Other lack of coordination: Secondary | ICD-10-CM | POA: Diagnosis present

## 2021-02-25 DIAGNOSIS — R41840 Attention and concentration deficit: Secondary | ICD-10-CM | POA: Diagnosis present

## 2021-02-25 DIAGNOSIS — R41842 Visuospatial deficit: Secondary | ICD-10-CM | POA: Insufficient documentation

## 2021-02-25 DIAGNOSIS — R2689 Other abnormalities of gait and mobility: Secondary | ICD-10-CM | POA: Insufficient documentation

## 2021-02-25 DIAGNOSIS — M6281 Muscle weakness (generalized): Secondary | ICD-10-CM

## 2021-02-25 NOTE — Therapy (Signed)
[facility] [address] [place], [name], [address] Phone: [phone]   Fax:  [phone]  Physical Therapy Evaluation  Patient Details  Name: Sheila Guerrero MRN: [id number] Date of Birth: [birth year] Referring Provider (PT): Barb Merino (not reffered by, but previous neurologist has been Dr. Krista Blue)   Encounter Date: 02/25/2021   PT End of Session - 02/25/21 1113    Visit Number 1    Number of Visits 9    Authorization Type [employer] - $40 co pay per discipline    PT Start Time 1018    PT Stop Time 1100    PT Time Calculation (min) 42 min    Equipment Utilized During Treatment Gait belt    Activity Tolerance Patient tolerated treatment well    Behavior During Therapy [facility] for tasks assessed/performed           Past Medical History:  Diagnosis Date  . Angioedema   . Anxiety   . Asthma   . Depression   . Diabetes mellitus, type II ([place])   . Dyslipidemia   . Family history of adverse reaction to anesthesia    mother severely confused after general anesthesia  . History of CVA (cerebrovascular accident)   . HTN (hypertension) 2013  . Panic attacks   . Pneumonia   . Septic embolism ([place]) 02/10/2021  . Situational stress   . Subacute endocarditis 02/10/2021    Past Surgical History:  Procedure Laterality Date  . BUBBLE STUDY  02/10/2021   Procedure: BUBBLE STUDY;  Surgeon: Dixie Dials, MD;  Location: [facility];  Service: Cardiovascular;;  . CHOLECYSTECTOMY  2010  . TEE WITHOUT CARDIOVERSION N/A 02/10/2021   Procedure: TRANSESOPHAGEAL ECHOCARDIOGRAM (TEE);  Surgeon: Dixie Dials, MD;  Location: [facility] ENDOSCOPY;  Service: Cardiovascular;  Laterality: N/A;    There were no vitals filed for this visit.    Subjective Assessment - 02/25/21 1022    Subjective Hx of CVA in 2017 - had problems with getting lost, finding objects, difficulties with remembering dates and times, difficulty with reading. Pt with  several week history of paresthesias, pain, and progressive weakness on lt side. Pt seen by Dr. Krista Blue, outpatient neurology, on 3/7 with concern for rt thalamic infarct but unable to have MRI scheduled until 3/15. Pt developed right lower extremity sciatica. Went to the ER for hip pain on 02/07/21. MRI showed small acute to early subacute infarct in the right  thalamus/posterior limb of internal capsule and small subacute left occipital lobe infarct. Discharged home on 02/12/21. Was using a cane, no longer using it anymore as of a couple days ago.  Now having L sided numbness and weakness. Biggest struggle with balance is with turning and pivoting. Very careful on stairs. Has to use furniture at times to touch for balance.  Reports sensation on L side can feel different everyday.    Pertinent History history of hypertension, hyperlipidemia, type 2 diabetes on Metformin at home, anxiety and depression, history of a stroke in 2017, Angioedema    Limitations Walking    Diagnostic tests MRI showed small acute to early subacute infarct in the right  thalamus/posterior limb of internal capsule and small subacute left occipital lobe infarct.    Patient Stated Goals wants to know what she can do and shouldn't do    Currently in Pain? Yes    Pain Location --   entire L side is very sensitive   Pain Orientation Left    Pain Descriptors / Indicators  Pressure   "feels like she has liquid in her body"   Pain Type Acute pain    Aggravating Factors  when having contact with the bed, covers, does not like too much contact with her L side on an object    Pain Relieving Factors getting up              [facility] PT Assessment - 02/25/21 1032      Assessment   Medical Diagnosis CVA    Referring Provider (PT) Barb Merino   not reffered by, but previous neurologist has been Dr. Krista Blue   Onset Date/Surgical Date 02/07/21    Hand Dominance Right      Precautions   Precautions Fall    Precaution Comments PICC line RUE       Balance Screen   Has the patient fallen in the past 6 months No    Has the patient had a decrease in activity level because of a fear of falling?  No   more concious   Is the patient reluctant to leave their home because of a fear of falling?  No      Home Ecologist residence   ranch house   Living Arrangements Spouse/significant other    Type of [place] to enter    Entrance Stairs-Number of Steps 2    [place] - single point;Shower seat    Additional Comments goes sideways and olds onto walls going up/down steps      Prior Function   Level of Independence Independent    Vocation Other (comment)    Vocation Requirements has not worked since 2017 since first CVA    Leisure enjoys music      Cognition   Overall Cognitive Status Impaired/Different from baseline   from after 2017 CVA, see speech therapy eval for more details     Observation/Other Assessments   Focus on Therapeutic Outcomes (FOTO)  53      Sensation   Light Touch Impaired by gross assessment;Impaired Detail    Light Touch Impaired Details Impaired LLE    Hot/Cold Impaired Detail   with LUE   Proprioception Appears Intact   at B ankles   Additional Comments light touch feels "lighter" with LLE      Coordination   Gross Motor Movements are Fluid and Coordinated Yes      Posture/Postural Control   Posture Comments L shoulder more depressed than R      ROM / Strength   AROM / PROM / Strength Strength      Strength   Strength Assessment Site Hip;Knee;Ankle    Right/Left Hip Right;Left    Right Hip Flexion 4+/5    Left Hip Flexion 4/5    Right/Left Knee Right;Left    Right Knee Flexion 5/5    Right Knee Extension 5/5    Left Knee Flexion 5/5    Left Knee Extension 4+/5    Right/Left Ankle Right;Left    Right Ankle Dorsiflexion 5/5    Left Ankle Dorsiflexion 5/5       Transfers   Transfers Sit to Stand;Stand to Sit    Sit to Stand 5: Supervision    Five time sit to stand comments  13.09 seconds no UE support    Stand to Sit 5: Supervision    Comments 30 second  chair stand: 12      Ambulation/Gait   Ambulation/Gait Yes    Ambulation/Gait Assistance 5: Supervision    Ambulation Distance (Feet) --   clinic distances   Assistive device None    Gait Pattern Decreased arm swing - left;Decreased stance time - left;Narrow base of support    Ambulation Surface Level;Indoor    Gait velocity 12.31 seconds = 2.66 ft/sec    Stairs Yes    Stairs Assistance 5: Supervision    Stairs Assistance Details (indicate cue type and reason) ascending - performs with no handrails, descending needs both handrail    Stair Management Technique No rails;Two rails;Forwards      High Level Balance   High Level Balance Comments mCTSIB: conditions 1-3: 30 seconds, mild postural sway to L with 2 and 3, condition 4: 4 seconds      Functional Gait  Assessment   Gait assessed  Yes    Gait Level Surface Walks 20 ft, slow speed, abnormal gait pattern, evidence for imbalance or deviates 10-15 in outside of the 12 in walkway width. Requires more than 7 sec to ambulate 20 ft.   10.81   Change in Gait Speed Able to change speed, demonstrates mild gait deviations, deviates 6-10 in outside of the 12 in walkway width, or no gait deviations, unable to achieve a major change in velocity, or uses a change in velocity, or uses an assistive device.    Gait with Horizontal Head Turns Performs head turns smoothly with slight change in gait velocity (eg, minor disruption to smooth gait path), deviates 6-10 in outside 12 in walkway width, or uses an assistive device.    Gait with Vertical Head Turns Performs task with slight change in gait velocity (eg, minor disruption to smooth gait path), deviates 6 - 10 in outside 12 in walkway width or uses assistive device    Gait and Pivot Turn Pivot turns safely  within 3 sec and stops quickly with no loss of balance.    Step Over Obstacle Is able to step over one shoe box (4.5 in total height) without changing gait speed. No evidence of imbalance.    Gait with Narrow Base of Support Ambulates less than 4 steps heel to toe or cannot perform without assistance.    Gait with Eyes Closed Cannot walk 20 ft without assistance, severe gait deviations or imbalance, deviates greater than 15 in outside 12 in walkway width or will not attempt task.    Ambulating Backwards Walks 20 ft, no assistive devices, good speed, no evidence for imbalance, normal gait    Steps Alternating feet, must use rail.    Total Score 17    FGA comment: 17/30                      Objective measurements completed on examination: See above findings.               PT Education - 02/25/21 1111    Education Details clinical findings, POC (due 1x week due to $40 co pay per discipline.    Person(s) Educated Patient    Methods Explanation    Comprehension Verbalized understanding            PT Short Term Goals - 02/25/21 1254      PT SHORT TERM GOAL #1   Title Pt will improve FGA score to at least a 20/30 in order to demo decr fall risk. ALL STGS DUE 03/25/21  Baseline 17/30    Time 4    Period Weeks    Status New    Target Date 03/25/21      PT SHORT TERM GOAL #2   Title Pt will improve gait speed with no AD to at least 2.9 ft/sec in order to demo improved community mobility.    Baseline 2.66 ft/sec with no AD    Time 4    Period Weeks    Status New      PT SHORT TERM GOAL #3   Title Pt will improve mCTSIB condition 4 to at least 15 seconds in order to demo improved vestibular input for balance.    Baseline 4 seconds    Time 4    Period Weeks    Status New             PT Long Term Goals - 02/25/21 1256      PT LONG TERM GOAL #1   Title Pt will be independent with final HEP in order to build upon functional gains made in therapy. ALL  LTGS DUE 04/22/21    Time 8    Period Weeks    Status New    Target Date 04/22/21      PT LONG TERM GOAL #2   Title Pt will improve FGA score to at least a 23/30 in order to demo decr fall risk.    Baseline 17/30    Time 8    Period Weeks    Status New      PT LONG TERM GOAL #3   Title Pt will improve gait speed with no AD to at least 3.2 ft/sec in order to demo improved community mobility.    Baseline 2.66 ft/sec with no AD    Time 8    Period Weeks    Status New      PT LONG TERM GOAL #4   Title Pt will improve FOTO score to at least a 65 in order to demo improved functional outcomes.    Baseline 53    Time 8    Period Weeks    Status New      PT LONG TERM GOAL #5   Title Pt will ambulate at least 500' over outdoor paved/grass surfaces with supervision and no AD in order to demo improved community mobility.    Time 8    Period Weeks    Status New                  Plan - 02/25/21 1259    Clinical Impression Statement Patient is a 60 year old female referred to Neuro OPPT for CVA. Pt's PMH is significant for: hypertension, hyperlipidemia, type 2 diabetes, anxiety and depression, history of a stroke in 2017, Angioedema. Pt with hx of CVA in 2017 (with cognitive deficits afterwards). Pt had several week history of paresthesias, pain, and progressive weakness on left side in february 2022.  Went to the ER for hip pain on 02/07/21. MRI showed small acute to early subacute infarct in the right  thalamus/posterior limb of internal capsule and small subacute left occipital lobe infarct. Pt was use a cane up until a couple days ago. Pt with L visual field deficits (not assessed, see OT note for more details).  The following deficits were present during the exam:  impaired LLE strength, impaired LUE/LLE sensation, gait abnormalities, balance impairments, postural abnormalities. Pt reports she has a hx of dizziness, but not today or since after CVA.  Based on FGA, pt is at a high risk  for falls.  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  Pt would benefit from skilled PT to address these impairments and functional limitations to maximize functional mobility independence    Personal Factors and Comorbidities Comorbidity 3+;Past/Current Experience;Time since onset of injury/illness/exacerbation   pt with $40 co pay per discipline   Comorbidities history of hypertension, hyperlipidemia, type 2 diabetes, anxiety and depression, history of a stroke in 2017, Angioedema    Examination-Activity Limitations Stairs;Transfers;Locomotion Level    Examination-Participation Restrictions Community Activity;Driving    Stability/Clinical Decision Making Evolving/Moderate complexity    Clinical Decision Making Moderate    Rehab Potential Good    PT Frequency 1x / week    PT Duration 8 weeks    PT Treatment/Interventions ADLs/Self Care Home Management;Gait training;Stair training;Functional mobility training;Therapeutic activities;Therapeutic exercise;Neuromuscular re-education;Balance training;DME Instruction;Patient/family education;Vestibular;Visual/perceptual remediation/compensation    PT Next Visit Plan initial HEP for functional strengthening/balance (narrow BOS, SLS, head motions, eyes closed). high level balance           Patient will benefit from skilled therapeutic intervention in order to improve the following deficits and impairments:  Abnormal gait,Decreased balance,Difficulty walking,Dizziness,Decreased strength,Impaired sensation,Postural dysfunction,Pain  Visit Diagnosis: Muscle weakness (generalized)  Other abnormalities of gait and mobility  Unsteadiness on feet     Problem List Patient Active Problem List   Diagnosis Date Noted  . Bacteremia   . Septic embolism ([place]) 02/10/2021  . Subacute endocarditis 02/10/2021  . Malnutrition of moderate degree 02/09/2021  . Acute ischemic stroke ([place]) 02/08/2021  . Infarction of right  thalamus ([place]) 02/07/2021  . Essential hypertension 02/07/2021  . Paresthesia 02/02/2021  . Gait abnormality 02/02/2021  . Confusion 05/08/2018  . Stroke ([place]) 03/17/2017  . Type 2 diabetes mellitus with hyperlipidemia ([place]) 03/17/2017  . Confusion state 02/16/2017  . Angioedema 02/16/2017  . Panic 02/04/2017  . Adjustment disorder with anxious mood 02/04/2017  . Sinusitis, chronic 12/28/2011  . Rhinitis, allergic 12/28/2011  . Extrinsic asthma, unspecified 12/07/2011    Arliss Journey, PT, DPT  02/25/2021, 1:08 PM  [facility] [address] [place], [name], [address] Phone: [phone]   Fax:  [phone]  Name: Sheila Guerrero MRN: [id number] Date of Birth: 06/20/1961

## 2021-02-25 NOTE — Therapy (Signed)
[facility] [address] [place], [name], [address] Phone: [phone]   Fax:  [phone]  Occupational Therapy Evaluation  Patient Details  Name: Sheila Guerrero MRN: [id number] Date of Birth: 1961-05-16 Referring Provider (OT): Dr. Nigel Bridgeman   Encounter Date: 02/25/2021   OT End of Session - 02/25/21 0955    Visit Number 1    Number of Visits 13    Date for OT Re-Evaluation 05/27/21    Authorization Type [employer]    Authorization Time Period POC written for 12 weks anticipate d/c after 8 weeks    Authorization - Visit Number 1    Authorization - Number of Visits 90   combined   OT Start Time 0848    OT Stop Time 0930    OT Time Calculation (min) 42 min    Activity Tolerance Patient tolerated treatment well           Past Medical History:  Diagnosis Date  . Angioedema   . Anxiety   . Asthma   . Depression   . Diabetes mellitus, type II ([place])   . Dyslipidemia   . Family history of adverse reaction to anesthesia    mother severely confused after general anesthesia  . History of CVA (cerebrovascular accident)   . HTN (hypertension) 2013  . Panic attacks   . Pneumonia   . Septic embolism ([place]) 02/10/2021  . Situational stress   . Subacute endocarditis 02/10/2021    Past Surgical History:  Procedure Laterality Date  . BUBBLE STUDY  02/10/2021   Procedure: BUBBLE STUDY;  Surgeon: Dixie Dials, MD;  Location: [facility];  Service: Cardiovascular;;  . CHOLECYSTECTOMY  2010  . TEE WITHOUT CARDIOVERSION N/A 02/10/2021   Procedure: TRANSESOPHAGEAL ECHOCARDIOGRAM (TEE);  Surgeon: Dixie Dials, MD;  Location: [facility] ENDOSCOPY;  Service: Cardiovascular;  Laterality: N/A;    There were no vitals filed for this visit.   Subjective Assessment - 02/25/21 0853    Subjective  Pt reports that she gets lost in a familiar location    Pertinent History 60 year old female with past medical history of angioedema,  hypertension, hyperlipidemia, type 2 diabetes, anxiety, depression, type 2 diabetes, CVA in 2017, new CVA March 02/07/21    Patient Stated Goals LUE weakness    Currently in Pain? Yes    Pain Score 5     Pain Location Back    Pain Orientation Right    Pain Descriptors / Indicators Aching    Pain Type Chronic pain    Pain Onset More than a month ago    Pain Frequency Intermittent    Aggravating Factors  walking    Pain Relieving Factors rest    Multiple Pain Sites Yes    Pain Location Arm    Pain Orientation Left    Pain Descriptors / Indicators Numbness;Tingling    Pain Type Chronic pain    Pain Onset More than a month ago    Pain Frequency Intermittent    Aggravating Factors  pressure    Pain Relieving Factors lyrica             [facility] OT Assessment - 02/25/21 1211      Assessment   Medical Diagnosis CVA    Referring Provider (OT) Dr. Nigel Bridgeman    Onset Date/Surgical Date 02/07/21    Hand Dominance Right      Precautions   Precautions Fall    Precaution Comments PICC line RUE      Balance Screen  Has the patient fallen in the past 6 months No    Has the patient had a decrease in activity level because of a fear of falling?  No    Is the patient reluctant to leave their home because of a fear of falling?  No      Home  Environment   Family/patient expects to be discharged to: Private residence    Lives With Spouse      Prior Function   Level of [place] Other (comment)    Vocation Requirements not working      ADL   Eating/Feeding Needs assist with cutting food    Grooming Modified independent    Lower Body Bathing Modified independent    Upper Body Dressing Independent    Lower Body Dressing Modified independent    Tub/Shower Transfer Modified independent    ADL comments Difficulty carrying items, dropping items, decreased spatial awareness, unable to carry a plate and cup at same time, fear of dropping plates when unloading  dishwasher      IADL   Shopping Needs to be accompanied on any shopping trip    Light Housekeeping Performs light daily tasks such as dishwashing, bed making   has cleaning person   Meal Prep Able to complete simple warm meal prep    Medication Management Is responsible for taking medication in correct dosages at correct time   needs assist with PICC line   Financial Management Dependent   Pt's husband is handling     Mobility   Mobility Status Independent      Vision - History   Baseline Vision Wears glasses all the time    Patient Visual Report Peripheral vision impairment   does not read because she is unable to retain information     Vision Assessment   Vision Assessment Vision impaired  _ to be further tested in functional context    Ocular Range of Motion Within Functional Limits    Tracking/Visual Pursuits --   Decreased smoothness to left side, blurriess L visual field   Visual Fields Left visual field deficit    Acuity --   Possible left visual field deficit   Patient has diffculty with activities due to visual impairment --   bumping into items, impaired spatial relations     Cognition   Overall Cognitive Status History of cognitive impairments - at baseline    Attention Sustained    Sustained Attention Impaired    Sustained Attention Impairment Verbal complex;Functional complex    Memory Impaired    Memory Impairment Storage deficit;Decreased short term memory;Decreased recall of new information    Awareness Impaired    Awareness Impairment Anticipatory impairment    Problem Solving Impaired    Executive Function Organizing;Sequencing    Sequencing Impaired    Organizing Impaired    Behaviors Poor frustration tolerance      Sensation   Light Touch Impaired by gross assessment;Impaired Detail   LUE   Hot/Cold Impaired Detail   LUE     Coordination   Gross Motor Movements are Fluid and Coordinated No    Fine Motor Movements are Fluid and Coordinated No    9 Hole  Peg Test Right;Left    Right 9 Hole Peg Test 24.53    Left 9 Hole Peg Test 39.22    Coordination impaired for LUE      AROM   Overall AROM  Within functional limits for tasks performed  Strength   Overall Strength Deficits    Overall Strength Comments RUE 5/5, LUE proximal and triceps 4/5, biceps 4+/5      Hand Function   Right Hand Grip (lbs) 70.3    Left Hand Grip (lbs) 42.5                             OT Short Term Goals - 02/25/21 1001      OT SHORT TERM GOAL #1   Title I with inital HEP (including coordination)    Time 6    Period Weeks    Status New    Target Date 03/25/21      OT SHORT TERM GOAL #2   Title Pt will verbalize understanding of compensatory strategies for visual and cognitive deficits.    Time 4    Period Weeks    Status New      OT SHORT TERM GOAL #3   Title Pt will perform basic cooking and home management modified independently demonstrating good safety awareness.    Time 4    Period Weeks    Status New      OT SHORT TERM GOAL #4   Title I with sensory precautions for LUE and will demonstrate understanding of compensations during functional activity.    Time 4    Period Weeks    Status New      OT SHORT TERM GOAL #5   Title Pt will locate items in a mod distracting environment with 90% or better accuracy in prep for driving/ grocery shopping.    Time 4    Period Weeks    Status New      Additional Short Term Goals   Additional Short Term Goals --      OT SHORT TERM GOAL #6   Title Pt will demonstrate improved fine motor coordination as evidenced by decreasing 9 hole peg test score to 35 secs or less with LUE.    Baseline RUE 24.53, LUE 39.22    Time 4    Period Weeks    Status New      OT SHORT TERM GOAL #7   Title I with bed positioning to minimize LUE pain at night time.    Time 4    Period Weeks    Status New             OT Long Term Goals - 02/25/21 1002      OT LONG TERM GOAL #1   Title I  with updated HEP for LUE strength.    Time 12    Target Date 05/20/21      OT LONG TERM GOAL #2   Title Pt will demonstrate ability to carry a cup of water in 1 hand and a plate in the other without drops or spills.    Time 12    Period Weeks    Status New      OT LONG TERM GOAL #3   Title Pt will increase LUE grip strength to 48 lbs or greater for increased functional use.    Time 12    Period Weeks    Status New      OT LONG TERM GOAL #4   Title Pt will perform a physical and cogntive task simultaneously with no LOB and 90% or better accuracy.    Time 12    Period Weeks    Status New  OT LONG TERM GOAL #5   Title Pt will complete FOTO survey at d/c.    Time 12    Period Weeks    Status New      Long Term Additional Goals   Additional Long Term Goals Yes      OT LONG TERM GOAL #6   Title Pt will menu plan, shop(online or in store) and cook a mod complex meal from start to finish modified independently.    Time 12    Period Weeks    Status New      OT LONG TERM GOAL #7   Title Pt will verbalize recommendations regarding return to driving including driving eval prn.    Time 12    Period Weeks    Status New                 Plan - 02/25/21 0956    Clinical Impression Statement 60 year old female with past medical history of angioedema, hypertension, hyperlipidemia, type 2 diabetes, anxiety, depression, type 2 diabetes, CVA in 2017 who presented to the ED with 4 weeks of left-sided paresthesias with progressive weakness on the left side.    She was seen by Dr. Krista Blue, neurology, on 3/7 who was concern for a right thalamic infarct but could not get her in for an MRI until next week.  She continued to have left-sided sensory changes of her face, body, upper and lower extremity and head new onset right lower extremity sciatica with discomfort to right knee.    Pt was hospitalized 02/07/21. After her stroke in 2017 she reports that she gets lost easily in well known areas  and difficulty finding objects as well difficulty with time and date. MRI showed Small acute to early subacute infarct in the right  thalamus/posterior limb of internal capsule. and Small subacute left occipital lobe infarct.  Pt presents with the following deficits:visual deficits, cogntive deficits, impaired LUE sensation, pain, decreased strength, decreased coordinatoin, decreased balance.    Pt can benefit from skilled occupational therapy to address these deficits in order to maximize pt's safety and I with ADLs/IADLs.    OT Occupational Profile and History Detailed Assessment- Review of Records and additional review of physical, cognitive, psychosocial history related to current functional performance    Occupational performance deficits (Please refer to evaluation for details): ADL's;IADL's;Leisure;Social Participation    Body Structure / Function / Physical Skills ADL;Endurance;UE functional use;Balance;Vision;Pain;Flexibility;FMC;ROM;Gait;GMC;Sensation;Decreased knowledge of precautions;Decreased knowledge of use of DME;IADL;Strength;Dexterity    Cognitive Skills Attention;Memory;Problem Solve;Safety Awareness;Sequencing;Temperament/Personality;Thought;Understand    Rehab Potential Good    Clinical Decision Making Limited treatment options, no task modification necessary    Comorbidities Affecting Occupational Performance: May have comorbidities impacting occupational performance    Modification or Assistance to Complete Evaluation  No modification of tasks or assist necessary to complete eval    OT Frequency 1x / week   plus eval   OT Duration 12 weeks    OT Treatment/Interventions Self-care/ADL training;Energy conservation;Visual/perceptual remediation/compensation;Patient/family education;DME and/or AE instruction;Paraffin;Passive range of motion;Balance training;Fluidtherapy;Cryotherapy;Therapist, nutritional;Therapeutic activities;Manual Therapy;Therapeutic exercise;Moist  Heat;Neuromuscular education;Cognitive remediation/compensation    Plan enveironmental scanning, further assess vision in functional context, HEP for LUE coordination    Consulted and Agree with Plan of Care Patient           Patient will benefit from skilled therapeutic intervention in order to improve the following deficits and impairments:   Body Structure / Function / Physical Skills: ADL,Endurance,UE functional use,Balance,Vision,Pain,Flexibility,FMC,ROM,Gait,GMC,Sensation,Decreased knowledge of precautions,Decreased knowledge of use of  DME,IADL,Strength,Dexterity Cognitive Skills: Attention,Memory,Problem Solve,Safety Awareness,Sequencing,Temperament/Personality,Thought,Understand     Visit Diagnosis: Frontal lobe and executive function deficit - Plan: Ot plan of care cert/re-cert  Attention and concentration deficit - Plan: Ot plan of care cert/re-cert  Other lack of coordination - Plan: Ot plan of care cert/re-cert  Muscle weakness (generalized) - Plan: Ot plan of care cert/re-cert  Visuospatial deficit - Plan: Ot plan of care cert/re-cert  Other abnormalities of gait and mobility - Plan: Ot plan of care cert/re-cert    Problem List Patient Active Problem List   Diagnosis Date Noted  . Bacteremia   . Septic embolism ([place]) 02/10/2021  . Subacute endocarditis 02/10/2021  . Malnutrition of moderate degree 02/09/2021  . Acute ischemic stroke ([place]) 02/08/2021  . Infarction of right thalamus ([place]) 02/07/2021  . Essential hypertension 02/07/2021  . Paresthesia 02/02/2021  . Gait abnormality 02/02/2021  . Confusion 05/08/2018  . Stroke ([place]) 03/17/2017  . Type 2 diabetes mellitus with hyperlipidemia ([place]) 03/17/2017  . Confusion state 02/16/2017  . Angioedema 02/16/2017  . Panic 02/04/2017  . Adjustment disorder with anxious mood 02/04/2017  . Sinusitis, chronic 12/28/2011  . Rhinitis, allergic 12/28/2011  . Extrinsic asthma, unspecified 12/07/2011     Tavarius Grewe 02/25/2021, 12:19 PM Theone Murdoch, OTR/L [phone] Phone: [phone] 12:20 PM 02/25/21 [facility] [address] [place], [name], [address] Phone: [phone]   Fax:  [phone]  Name: Marylon Verno MRN: [id number] Date of Birth: Apr 25, 1961

## 2021-02-25 NOTE — Therapy (Signed)
[facility] [address] [place], [name], [address] Phone: [phone]   Fax:  [phone]  Speech Language Pathology Evaluation  Patient Details  Name: Sheila Guerrero MRN: [id number] Date of Birth: 07-04-1961 Referring Provider (SLP): Dr. Barb Merino   Encounter Date: 02/25/2021   End of Session - 02/25/21 1056    Visit Number 1    Number of Visits 17    Date for SLP Re-Evaluation 04/22/21   17 visits   Authorization Type due to high copay of $40 per discipline, Sheila Guerrero is electing to attend 1x a week for 8 weeks    SLP Start Time 0932    SLP Stop Time  1015    SLP Time Calculation (min) 43 min    Activity Tolerance Patient tolerated treatment well           Past Medical History:  Diagnosis Date  . Angioedema   . Anxiety   . Asthma   . Depression   . Diabetes mellitus, type II ([place])   . Dyslipidemia   . Family history of adverse reaction to anesthesia    mother severely confused after general anesthesia  . History of CVA (cerebrovascular accident)   . HTN (hypertension) 2013  . Panic attacks   . Pneumonia   . Septic embolism ([place]) 02/10/2021  . Situational stress   . Subacute endocarditis 02/10/2021    Past Surgical History:  Procedure Laterality Date  . BUBBLE STUDY  02/10/2021   Procedure: BUBBLE STUDY;  Surgeon: Dixie Dials, MD;  Location: [facility];  Service: Cardiovascular;;  . CHOLECYSTECTOMY  2010  . TEE WITHOUT CARDIOVERSION N/A 02/10/2021   Procedure: TRANSESOPHAGEAL ECHOCARDIOGRAM (TEE);  Surgeon: Dixie Dials, MD;  Location: [facility] ENDOSCOPY;  Service: Cardiovascular;  Laterality: N/A;    There were no vitals filed for this visit.   Subjective Assessment - 02/25/21 0946    Subjective "I used to take care of my parents"    Currently in Pain? Yes    Pain Score 5     Pain Location Back    Pain Orientation Right    Pain Descriptors / Indicators Aching    Pain Type Chronic pain     Pain Onset More than a month ago    Pain Frequency Intermittent    Aggravating Factors  walking    Pain Relieving Factors rest              SLP Evaluation [facility] - 02/25/21 0240      SLP Visit Information   SLP Received On 02/25/21    Referring Provider (SLP) Dr. Barb Merino      General Information   HPI Ms. Sheila Guerrero is a 60 y.o. female with history of DM2, HTN, stroke in 2017, panic attacks, situational stress and dyslipidemia who presented to the ED on Saturday morning with a chief complaint of left sided numbness and tingling x 4 weeks in conjunction with progressive left sided weakness. The patient's symptoms began on February 14 with sudden onset of left face, arm and leg numbness in addition to tingling and weakness with some gait abnormality. She was seen by her PCP for this and was prescribed Lyrica for possible neuropathy. The Lyrica did not improve her symptoms, so she went to her Neurologist, Dr. Krista Blue, who ordered an MRI which was to be done this Tuesday. Dr. Krista Blue also switched the patient from her daily ASA, which she had been taking since her stroke in 2017, to Plavix. Per  Dr. Rhea Belton note, the patient's prior MRI of the brain in 2018 was consistent with chronic small vessel disease with evidence of right dominant chronic cerebral microhemorrhage and chronic lacunar infarct. EEG in 2018 was normal. The pain extends along the anterolateral aspect of her right thigh to the lateral aspect of her knee. MRI brain at [facility] revealed a right thalamic subacute ischemic infarction as well as a small subacute left occipital lobe ischemic infarction. Numerous chronic microhemorrhages appearing most consistent with chronic hypertension were seen.   Sheila Guerrero endorses memory, attention and executive function impairments since 1st CVA 2017, however she did not receive neurorehab    Mobility Status walks independently      Balance Screen   Has the patient fallen in the past 6 months No     Has the patient had a decrease in activity level because of a fear of falling?  No    Is the patient reluctant to leave their home because of a fear of falling?  No      Prior Functional Status   Cognitive/Linguistic Baseline Baseline deficits    Baseline deficit details memory, attetnion, executive function - no OT or ST received after prior CVA    Type of Home House     Lives With Spouse    Available Support Family    Vocation Unemployed      Cognition   Overall Cognitive Status Impaired/Different from baseline    Area of Impairment Attention;Memory;Problem solving    Attention Sustained    Sustained Attention Impaired    Sustained Attention Impairment Verbal complex;Functional complex    Memory Impaired    Memory Impairment Storage deficit;Decreased short term memory;Decreased recall of new information    Awareness Impaired    Awareness Impairment Anticipatory impairment    Problem Solving Impaired    Problem Solving Impairment Verbal complex;Functional complex    Statistician Impaired    Behaviors Poor frustration tolerance      Auditory Comprehension   Overall Auditory Comprehension Appears within functional limits for tasks assessed    Interfering Components Attention;Processing speed;Working Radiographer, therapeutic Pausing;Repetition;Visual/Gestural cues;Extra processing time      Visual Recognition/Discrimination   Discrimination Not tested      Reading Comprehension   Reading Status Not tested   reading affected by poor memory     Expression   Primary Mode of Expression Verbal      Verbal Expression   Overall Verbal Expression Appears within functional limits for tasks assessed   Pt endorses some word finding difficulty, not appreciated during this eval     Written Expression   Dominant Hand Right    Written Expression Not tested      Oral Motor/Sensory Function   Overall Oral  Motor/Sensory Function Appears within functional limits for tasks assessed      Motor Speech   Overall Motor Speech Appears within functional limits for tasks assessed      Standardized Assessments   Standardized Assessments  Cognitive Linguistic Quick Test      Cognitive Linguistic Quick Test (Ages 18-69)   Attention WNL    Memory WNL    Executive Function WNL    Language WNL    Visuospatial Skills Mild    Severity Rating Total 19    Composite Severity Rating 16.6  SLP Education - 02/25/21 1055    Education Details results, goals for therapy    Person(s) Educated Patient    Methods Explanation    Comprehension Verbalized understanding;Verbal cues required            SLP Short Term Goals - 02/25/21 1125      SLP SHORT TERM GOAL #1   Title Pt will use external aids to manage appointments and daily tasks with occasional min A over 2 sessions    Time 4    Period Weeks    Status New      SLP SHORT TERM GOAL #2   Title Pt will use external aids (such as grocery list organized by department) to successfully obtain all items needed with occasional min A over 2 shopping trips    Time 4    Period Weeks    Status New      SLP SHORT TERM GOAL #3   Title Pt will use compensatory strategies for cognition to pay 3 bills with rare min A from spouse    Time 4    Period Weeks    Status New      SLP SHORT TERM GOAL #4   Title Pt will use visual reminder to lock doors prior to leaving and bedtime with rare min A over 1 week    Time 4    Period Weeks    Status New            SLP Long Term Goals - 02/25/21 1130      SLP LONG TERM GOAL #1   Title Pt will use external aids to complete 3 household tasks a day without leaving them incomplete before intiatating next task with rare min A    Time 8    Period Weeks    Status New      SLP LONG TERM GOAL #2   Title Pt will report no "mix up" in appointments or schedules over 2 weeks     Time 8    Period Weeks    Status New      SLP LONG TERM GOAL #3   Title Pt will manage all household bills with supervision from spouse over 1 month    Time 8    Period Weeks    Status New      SLP LONG TERM GOAL #4   Title Pt will carryover memory strategies to recall 3 details from 4 paragraph written passage with occasional min A    Time 8    Period Weeks    Status New      SLP LONG TERM GOAL #5   Title Pt will score a 15 or lower on Neuro-QOL-Cognitive Function- Short Form patient related outcome measure    Time [place] - 02/25/21 1059    Clinical Impression Statement Sheila Guerrero is referred to outpatient ST due to cognitive communication impairments s/p CVA 12/2020. She was hospitalized 02/07/21 to 02/12/21. She does endorse cognitive difficulties since CVA 2017, however she never received ST or OT after this CVA. She has been unable to return to her high level job due to cognitive impairments. Today, she presents with mild high level cognitive linguistic impairments. She score low normal on attention, memory, and executive functions on the CLQT, which would be a decrease from her PLOF. Sheila Guerrero reports poor attention, often starting household  tasks then getting distracted and leaving them unfinished. She began to make her bed one morning, started laundry, did not turn the machine on and found her bed reamined unmaid at bedtime. She reports this happens daily. She also reports poor mangement of her schedule, showing up to our clinic on the wrong day, then having to quickly get a ride home from her family. She usually manages family finances, but her husband is doing this now. She would like to return to financial management at home. At this time, due to not driving and visual spatial impairment, she is unable to help care for her elderly parents in [place], where prior she was visiting them twice a week. She endorses getting lost driving and  difficulty navigating grocery store, as she stated "I couldn't think of what section the potatoes would be in so I couldn't find them." She is cooking and cleaning without safety "near misses" per her report. She does report frequently forgetting to lock her doors and she has stopped reading for pleasure as she forgets what she has read after a few pages.  During thte CLQT, Chrisst thrice aborted tasks stating "I can't focus on this anymore." - on 2 minute and 3 minte tasks. She scored a 14 on the Neuro QOL Cognitive Function Short Form with the most difficulty reported in understading written materal, slow thinking, working hard to pay attention and trouble concentrating. Difficulties she reported "a lot" included follwong ocmples written instruction and planning and keeping appointments.  I recommend skilled ST to maximize cognition for indepencence, safety and to return to PLOF for household management and community participation    Speech Therapy Frequency 2x / week    Duration 8 weeks   17 visits   Treatment/Interventions Environmental controls;Cueing hierarchy;SLP instruction and feedback;Compensatory strategies;Functional tasks;Cognitive reorganization;Compensatory techniques;Internal/external aids;Multimodal communcation approach;Patient/family education;Language facilitation    Potential to Achieve Goals Good    Consulted and Agree with Plan of Care Patient           Patient will benefit from skilled therapeutic intervention in order to improve the following deficits and impairments:   Cognitive communication deficit    Problem List Patient Active Problem List   Diagnosis Date Noted  . Bacteremia   . Septic embolism ([place]) 02/10/2021  . Subacute endocarditis 02/10/2021  . Malnutrition of moderate degree 02/09/2021  . Acute ischemic stroke ([place]) 02/08/2021  . Infarction of right thalamus ([place]) 02/07/2021  . Essential hypertension 02/07/2021  . Paresthesia 02/02/2021  . Gait  abnormality 02/02/2021  . Confusion 05/08/2018  . Stroke ([place]) 03/17/2017  . Type 2 diabetes mellitus with hyperlipidemia ([place]) 03/17/2017  . Confusion state 02/16/2017  . Angioedema 02/16/2017  . Panic 02/04/2017  . Adjustment disorder with anxious mood 02/04/2017  . Sinusitis, chronic 12/28/2011  . Rhinitis, allergic 12/28/2011  . Extrinsic asthma, unspecified 12/07/2011    Yovani Cogburn, Annye Rusk  MS, CCC-SLP 02/25/2021, 11:38 AM  [facility] [address] [place], [name], [address] Phone: [phone]   Fax:  [phone]  Name: Sheila Guerrero MRN: [id number] Date of Birth: 17-Oct-1961

## 2021-03-02 ENCOUNTER — Other Ambulatory Visit: Payer: Self-pay | Admitting: *Deleted

## 2021-03-02 NOTE — Patient Outreach (Signed)
[facility]) Care Management  03/02/2021  Sheila Guerrero [birth year] [id number]   RED ON EMMI ALERT - Stroke Day # 13 Date: 4/1 Red Alert Reason: Not been to follow up appointment   Outreach attempt #1, successful.  Member report she received the EMMI call on prior to her appointment date.  She has since attended follow up, will see neurologist again in June.  Denies any other concerns, state she is feeling better from the antibiotics side effects.     Plan: RN CM will close case at this time, no further needs identified.  Valente David, [place], MSN [place] [phone]

## 2021-03-04 ENCOUNTER — Telehealth: Payer: Self-pay | Admitting: Neurology

## 2021-03-04 NOTE — Telephone Encounter (Signed)
Patient called in and would like to switch her care from Dr. Krista Blue to Dr. Jannifer Franklin. Would you both be ok with this?

## 2021-03-04 NOTE — Telephone Encounter (Signed)
Okay for the switch.

## 2021-03-05 ENCOUNTER — Other Ambulatory Visit: Payer: Self-pay

## 2021-03-05 ENCOUNTER — Ambulatory Visit: Payer: 59 | Admitting: Infectious Disease

## 2021-03-05 VITALS — BP 151/93 | HR 106 | Temp 98.1°F | Wt 177.0 lb

## 2021-03-05 DIAGNOSIS — I330 Acute and subacute infective endocarditis: Secondary | ICD-10-CM

## 2021-03-05 DIAGNOSIS — I76 Septic arterial embolism: Secondary | ICD-10-CM | POA: Diagnosis not present

## 2021-03-05 DIAGNOSIS — E1169 Type 2 diabetes mellitus with other specified complication: Secondary | ICD-10-CM | POA: Diagnosis not present

## 2021-03-05 DIAGNOSIS — I639 Cerebral infarction, unspecified: Secondary | ICD-10-CM | POA: Diagnosis not present

## 2021-03-05 DIAGNOSIS — E785 Hyperlipidemia, unspecified: Secondary | ICD-10-CM

## 2021-03-05 DIAGNOSIS — I10 Essential (primary) hypertension: Secondary | ICD-10-CM

## 2021-03-05 NOTE — Progress Notes (Signed)
Subjective:   Chief complaint follow-up for culture-negative endocarditis  Patient ID: Sheila Guerrero, female    DOB: 07-Jun-1961, 60 y.o.   MRN: [id number]  HPI  Sheila Guerrero is a 60 year old female who was admitted to hospital and found to have strokes consistent with septic embolization.  She underwent transthoracic echocardiogram and transesophageal echocardiogram that showed evidence of vegetations on the tricuspid and mitral valves.  She also had some back pain which radiated down her leg.  We did blood cultures on her which were unrevealing and started on daptomycin and ceftriaxone.  We did repeat blood cultures afterwards to make sure her blood was sterile prior to inserting a PICC line.  We have performed MRI of the spine which was unrevealing  He is continued on her antimicrobials without problems her labs have been reviewed the been sent them home health and are unremarkable.  She is also on anticoagulation.  PICC line had become difficult to infuse antibiotics earlier in the week but that now appears to have resolved.    Past Medical History:  Diagnosis Date  . Angioedema   . Anxiety   . Asthma   . Depression   . Diabetes mellitus, type II ([place])   . Dyslipidemia   . Family history of adverse reaction to anesthesia    mother severely confused after general anesthesia  . History of CVA (cerebrovascular accident)   . HTN (hypertension) 2013  . Panic attacks   . Pneumonia   . Septic embolism ([place]) 02/10/2021  . Situational stress   . Subacute endocarditis 02/10/2021    Past Surgical History:  Procedure Laterality Date  . BUBBLE STUDY  02/10/2021   Procedure: BUBBLE STUDY;  Surgeon: Dixie Dials, MD;  Location: [facility];  Service: Cardiovascular;;  . CHOLECYSTECTOMY  2010  . TEE WITHOUT CARDIOVERSION N/A 02/10/2021   Procedure: TRANSESOPHAGEAL ECHOCARDIOGRAM (TEE);  Surgeon: Dixie Dials, MD;  Location: [facility] END[facility]COPY;  Service: Cardiovascular;  Laterality:  N/A;    Family History  Problem Relation Age of Onset  . Hypertension Mother   . Other Mother        blood disorder  . Hypertension Father   . Stroke Father   . Diabetes Maternal Aunt       Social History   Socioeconomic History  . Marital status: Married    Spouse name: Not on file  . Number of children: 0  . Years of education: Bachelors  . Highest education level: Not on file  Occupational History  . Occupation: Homemaker now  Tobacco Use  . Smoking status: Never Smoker  . Smokeless tobacco: Never Used  Substance and Sexual Activity  . Alcohol use: Yes    Comment: rarely  . Drug use: Never  . Sexual activity: Yes    Birth control/protection: None  Other Topics Concern  . Not on file  Social History Narrative   Lives at home with husband.   Right-handed.   No daily use of caffeine.   Social Determinants of Health   Financial Resource Strain: Not on file  Food Insecurity: Not on file  Transportation Needs: Not on file  Physical Activity: Not on file  Stress: Not on file  Social Connections: Not on file    Allergies  Allergen Reactions  . Sulfa Antibiotics Swelling  . Dulaglutide     Other reaction(s): rash  . Empagliflozin     Other reaction(s): recurrent yeast  . Iodinated Diagnostic Agents     Other reaction(s): angioedema with CT dye  .  Latex Rash     Current Outpatient Medications:  .  acetaminophen (TYLENOL) 500 MG tablet, Take 1,000 mg by mouth every 6 (six) hours as needed for moderate pain., Disp: , Rfl:  .  aspirin EC 81 MG EC tablet, Take 1 tablet (81 mg total) by mouth daily. Swallow whole., Disp: 30 tablet, Rfl: 11 .  cefTRIAXone (ROCEPHIN) IVPB, Inject 2 g into the vein every 12 (twelve) hours. Indication: Endocarditis First Dose: No Last Day of Therapy:  03/18/2021 Labs - Once weekly:  CBC/D and BMP, Labs - Every other week:  ESR and CRP Method of administration: IV Push Method of administration may be changed at the discretion of home  infusion pharmacist based upon assessment of the patient and/or caregiver's ability to self-administer the medication ordered., Disp: 68 Units, Rfl: 0 .  daptomycin (CUBICIN) IVPB, Inject 800 mg into the vein daily. Indication:  Endocarditis First Dose: No Last Day of Therapy:  03/18/2021 Labs - Once weekly:  CBC/D, BMP, and CPK Labs - Every other week:  ESR and CRP Method of administration: IV Push Method of administration may be changed at the discretion of home infusion pharmacist based upon assessment of the patient and/or caregiver's ability to self-administer the medication ordered., Disp: 34 Units, Rfl: 0 .  EPINEPHrine 0.3 mg/0.3 mL IJ SOAJ injection, Inject 0.3 mg into the muscle daily as needed., Disp: , Rfl:  .  insulin detemir (LEVEMIR) 100 UNIT/ML FlexPen, Inject 20 Units into the skin at bedtime., Disp: 15 mL, Rfl: 11 .  Insulin Pen Needle (PEN NEEDLES) 29G X MISC, 1 each by Does not apply route daily., Disp: 30 each, Rfl: 2 .  metFORMIN (GLUCOPHAGE-XR) 500 MG 24 hr tablet, Take 2,000 mg by mouth daily., Disp: , Rfl:  .  pregabalin (LYRICA) 75 MG capsule, Take 150 mg by mouth 2 (two) times daily., Disp: , Rfl:  .  sacubitril-valsartan (ENTRESTO) 24-26 MG, Take 1 tablet by mouth 2 (two) times daily., Disp: 180 tablet, Rfl: 0 .  Ascorbic Acid (VITAMIN C) 500 MG CAPS, Take 500 mg by mouth daily. (Patient not taking: Reported on 03/05/2021), Disp: , Rfl:  .  atorvastatin (LIPITOR) 20 MG tablet, Take 1 tablet (20 mg total) by mouth daily. Not to be taken until finishing antibiotics (Patient not taking: Reported on 03/05/2021), Disp: , Rfl:  .  CALCIUM-VITAMIN D PO, Take 1 tablet by mouth daily. (Patient not taking: Reported on 03/05/2021), Disp: , Rfl:  .  carvedilol (COREG) 3.125 MG tablet, Take 1 tablet (3.125 mg total) by mouth 2 (two) times daily with a meal., Disp: 60 tablet, Rfl: 2 .  Cholecalciferol (VITAMIN D-3) 5000 UNIT/ML LIQD, Take 1 tablet by mouth daily. (Patient not taking: Reported  on 03/05/2021), Disp: , Rfl:  .  CINNAMON PO, Take 1 tablet by mouth daily. (Patient not taking: Reported on 03/05/2021), Disp: , Rfl:  .  Flaxseed, Linseed, (FLAX SEED OIL) 1000 MG CAPS, Take 1 capsule by mouth daily. (Patient not taking: Reported on 03/05/2021), Disp: , Rfl:  .  lidocaine (LIDODERM) 5 %, Place 1 patch onto the skin daily. Remove & Discard patch within 12 hours or as directed by MD (Patient not taking: Reported on 03/05/2021), Disp: 30 patch, Rfl: 0 .  MAGNESIUM GLUCONATE PO, Take 1 tablet by mouth daily. (Patient not taking: Reported on 03/05/2021), Disp: , Rfl:  .  melatonin 3 MG TABS tablet, Take 3 mg by mouth daily as needed. (Patient not taking: Reported on 03/05/2021),  Disp: , Rfl:  .  OIL OF OREGANO PO, Take 1 capsule by mouth daily. (Patient not taking: Reported on 03/05/2021), Disp: , Rfl:  .  OLIVE LEAF EXTRACT PO, Take 1 capsule by mouth daily. (Patient not taking: Reported on 03/05/2021), Disp: , Rfl:  .  omega-3 fish oil (MAXEPA) 1000 MG CAPS capsule, Take 1 capsule by mouth daily. (Patient not taking: Reported on 03/05/2021), Disp: , Rfl:  .  sodium chloride (OCEAN) 0.65 % SOLN nasal spray, Place 1 spray into both nostrils daily as needed for congestion. (Patient not taking: Reported on 03/05/2021), Disp: , Rfl:  .  Tragacanth (ASTRAGALUS ROOT) POWD, Take 1 Dose by mouth daily. (Patient not taking: Reported on 03/05/2021), Disp: , Rfl:  .  traMADol (ULTRAM) 50 MG tablet, Take 50 mg by mouth 2 (two) times daily as needed., Disp: , Rfl:  .  traZODone (DESYREL) 100 MG tablet, Take 100 mg by mouth at bedtime. (Patient not taking: Reported on 03/05/2021), Disp: , Rfl:  .  Turmeric 500 MG CAPS, Take 1 tablet by mouth daily. (Patient not taking: Reported on 03/05/2021), Disp: , Rfl:    Review of Systems  Constitutional: Negative for activity change, appetite change, chills, diaphoresis, fatigue, fever and unexpected weight change.  HENT: Negative for congestion, rhinorrhea, sinus pressure, sneezing,  sore throat and trouble swallowing.   Eyes: Negative for photophobia and visual disturbance.  Respiratory: Negative for cough, chest tightness, shortness of breath, wheezing and stridor.   Cardiovascular: Negative for chest pain, palpitations and leg swelling.  Gastrointestinal: Negative for abdominal distention, abdominal pain, anal bleeding, blood in stool, constipation, diarrhea, nausea and vomiting.  Genitourinary: Negative for difficulty urinating, dysuria, flank pain and hematuria.  Musculoskeletal: Negative for arthralgias, back pain, gait problem, joint swelling and myalgias.  Skin: Negative for color change, pallor, rash and wound.  Neurological: Negative for dizziness, tremors, weakness and light-headedness.  Hematological: Negative for adenopathy. Does not bruise/bleed easily.  Psychiatric/Behavioral: Negative for agitation, behavioral problems, confusion, decreased concentration, dysphoric mood and sleep disturbance.       Objective:   Physical Exam Constitutional:      General: She is not in acute distress.    Appearance: Normal appearance. She is well-developed. She is not ill-appearing or diaphoretic.  HENT:     Head: Normocephalic and atraumatic.     Right Ear: Hearing and external ear normal.     Left Ear: Hearing and external ear normal.     Nose: No nasal deformity or rhinorrhea.  Eyes:     General: No scleral icterus.    Conjunctiva/sclera: Conjunctivae normal.     Right eye: Right conjunctiva is not injected.     Left eye: Left conjunctiva is not injected.     Pupils: Pupils are equal, round, and reactive to light.  Neck:     Vascular: No JVD.  Cardiovascular:     Rate and Rhythm: Normal rate and regular rhythm.     Heart sounds: Normal heart sounds, S1 normal and S2 normal. No murmur heard. No friction rub.  Abdominal:     General: Bowel sounds are normal. There is no distension.     Palpations: Abdomen is soft.     Tenderness: There is no abdominal  tenderness.  Musculoskeletal:        General: Normal range of motion.     Right shoulder: Normal.     Left shoulder: Normal.     Cervical back: Normal range of motion and neck supple.  Right hip: Normal.     Left hip: Normal.     Right knee: Normal.     Left knee: Normal.  Lymphadenopathy:     Head:     Right side of head: No submandibular, preauricular or posterior auricular adenopathy.     Left side of head: No submandibular, preauricular or posterior auricular adenopathy.     Cervical: No cervical adenopathy.     Right cervical: No superficial or deep cervical adenopathy.    Left cervical: No superficial or deep cervical adenopathy.  Skin:    General: Skin is warm and dry.     Coloration: Skin is not pale.     Findings: No abrasion, bruising, ecchymosis, erythema, lesion or rash.     Nails: There is no clubbing.  Neurological:     Mental Status: She is alert and oriented to person, place, and time.     Sensory: No sensory deficit.     Coordination: Coordination normal.     Gait: Gait normal.  Psychiatric:        Attention and Perception: She is attentive.        Mood and Affect: Mood normal.        Speech: Speech normal.        Behavior: Behavior normal. Behavior is cooperative.        Thought Content: Thought content normal.        Judgment: Judgment normal.    PICC is clean dry and intact:           Assessment & Plan:  Culture-negative endocarditis with septic emboli to the brain:  Continue daptomycin and ceftriaxone to complete therapy.  We will bring her back 2 weeks after finishing therapy and check surveillance cultures although they will undoubtedly be negative.  She is going to cardiology tomorrow and likely will have a repeat transesophageal echocardiogram least certainly a 2D echocardiogram.  I spent greater than 30 minutes with the patient including greater than 50% of time in face to face counsel of the patient and in coordination of her  care.

## 2021-03-05 NOTE — Telephone Encounter (Signed)
Ok to switch 

## 2021-03-06 ENCOUNTER — Ambulatory Visit: Payer: 59 | Attending: Internal Medicine

## 2021-03-06 ENCOUNTER — Ambulatory Visit: Payer: 59

## 2021-03-06 ENCOUNTER — Encounter: Payer: Self-pay | Admitting: Occupational Therapy

## 2021-03-06 ENCOUNTER — Ambulatory Visit: Payer: 59 | Admitting: Occupational Therapy

## 2021-03-06 VITALS — BP 140/90 | HR 104

## 2021-03-06 DIAGNOSIS — R278 Other lack of coordination: Secondary | ICD-10-CM | POA: Insufficient documentation

## 2021-03-06 DIAGNOSIS — R41840 Attention and concentration deficit: Secondary | ICD-10-CM | POA: Insufficient documentation

## 2021-03-06 DIAGNOSIS — R2689 Other abnormalities of gait and mobility: Secondary | ICD-10-CM | POA: Insufficient documentation

## 2021-03-06 DIAGNOSIS — R2681 Unsteadiness on feet: Secondary | ICD-10-CM

## 2021-03-06 DIAGNOSIS — R41841 Cognitive communication deficit: Secondary | ICD-10-CM | POA: Diagnosis present

## 2021-03-06 DIAGNOSIS — M6281 Muscle weakness (generalized): Secondary | ICD-10-CM

## 2021-03-06 DIAGNOSIS — R41842 Visuospatial deficit: Secondary | ICD-10-CM | POA: Diagnosis present

## 2021-03-06 DIAGNOSIS — R41844 Frontal lobe and executive function deficit: Secondary | ICD-10-CM | POA: Insufficient documentation

## 2021-03-06 NOTE — Therapy (Signed)
[facility] [address] [place], [name], [address] Phone: [phone]   Fax:  [phone]  Occupational Therapy Treatment  Patient Details  Name: Sheila Guerrero MRN: [id number] Date of Birth: 05/20/1961 Referring Provider (OT): Dr. Nigel Bridgeman   Encounter Date: 03/06/2021   OT End of Session - 03/06/21 0936    Visit Number 2    Number of Visits 13    Date for OT Re-Evaluation 05/27/21    Authorization Type [employer]    Authorization Time Period POC written for 12 weks anticipate d/c after 8 weeks    Authorization - Visit Number 2    Authorization - Number of Visits 90   combined   OT Start Time 0934    OT Stop Time 1012    OT Time Calculation (min) 38 min    Activity Tolerance Patient tolerated treatment well           Past Medical History:  Diagnosis Date  . Angioedema   . Anxiety   . Asthma   . Depression   . Diabetes mellitus, type II ([place])   . Dyslipidemia   . Family history of adverse reaction to anesthesia    mother severely confused after general anesthesia  . History of CVA (cerebrovascular accident)   . HTN (hypertension) 2013  . Panic attacks   . Pneumonia   . Septic embolism ([place]) 02/10/2021  . Situational stress   . Subacute endocarditis 02/10/2021    Past Surgical History:  Procedure Laterality Date  . BUBBLE STUDY  02/10/2021   Procedure: BUBBLE STUDY;  Surgeon: Dixie Dials, MD;  Location: [facility];  Service: Cardiovascular;;  . CHOLECYSTECTOMY  2010  . TEE WITHOUT CARDIOVERSION N/A 02/10/2021   Procedure: TRANSESOPHAGEAL ECHOCARDIOGRAM (TEE);  Surgeon: Dixie Dials, MD;  Location[facility] ENDOSCOPY;  Service: Cardiovascular;  Laterality: N/A;    There were no vitals filed for this visit.   Subjective Assessment - 03/06/21 0936    Subjective  Pt reports "doing alright" and reports pain all over the left side.    Pertinent History 60 year old female with past medical history of angioedema,  hypertension, hyperlipidemia, type 2 diabetes, anxiety, depression, type 2 diabetes, CVA in 2017, new CVA March 02/07/21    Patient Stated Goals LUE weakness    Currently in Pain? Yes    Pain Score 7     Pain Location Generalized   entire L side   Pain Orientation Left    Pain Descriptors / Indicators Numbness;Tingling;Pressure    Pain Type Acute pain    Pain Onset More than a month ago    Pain Frequency Constant    Aggravating Factors  pressure - laying on the left side, sitting    Pain Relieving Factors takes Lyrica and it "quiets it a little"            TREATMENT:  Small Pegboard with LUE and copying pattern with good attention to detail and coordination  Environmental Scanning 13/15 accuracy with 87% in a minimall distracting environment. Pt with errors on the left side. Pt located remaining 2 on second pass with no additional cueing.  Pt was instructed in coordination HEP, see pt instructions.                   OT Education - 03/06/21 0940    Education Details reviewed goals from evaluation. Coordination HEP    Person(s) Educated Patient    Methods Explanation;Demonstration    Comprehension Verbalized understanding;Returned demonstration  OT Short Term Goals - 03/06/21 4098      OT SHORT TERM GOAL #1   Title I with inital HEP (including coordination)    Time 6    Period Weeks    Status On-going   issued coordination HEP   Target Date 03/25/21      OT SHORT TERM GOAL #2   Title Pt will verbalize understanding of compensatory strategies for visual and cognitive deficits.    Time 4    Period Weeks    Status New      OT SHORT TERM GOAL #3   Title Pt will perform basic cooking and home management modified independently demonstrating good safety awareness.    Time 4    Period Weeks    Status New      OT SHORT TERM GOAL #4   Title I with sensory precautions for LUE and will demonstrate understanding of compensations during functional  activity.    Time 4    Period Weeks    Status New      OT SHORT TERM GOAL #5   Title Pt will locate items in a mod distracting environment with 90% or better accuracy in prep for driving/ grocery shopping.    Time 4    Period Weeks    Status New      OT SHORT TERM GOAL #6   Title Pt will demonstrate improved fine motor coordination as evidenced by decreasing 9 hole peg test score to 35 secs or less with LUE.    Baseline RUE 24.53, LUE 39.22    Time 4    Period Weeks    Status New      OT SHORT TERM GOAL #7   Title I with bed positioning to minimize LUE pain at night time.    Time 4    Period Weeks    Status New             OT Long Term Goals - 02/25/21 1002      OT LONG TERM GOAL #1   Title I with updated HEP for LUE strength.    Time 12    Target Date 05/20/21      OT LONG TERM GOAL #2   Title Pt will demonstrate ability to carry a cup of water in 1 hand and a plate in the other without drops or spills.    Time 12    Period Weeks    Status New      OT LONG TERM GOAL #3   Title Pt will increase LUE grip strength to 48 lbs or greater for increased functional use.    Time 12    Period Weeks    Status New      OT LONG TERM GOAL #4   Title Pt will perform a physical and cogntive task simultaneously with no LOB and 90% or better accuracy.    Time 12    Period Weeks    Status New      OT LONG TERM GOAL #5   Title Pt will complete FOTO survey at d/c.    Time 12    Period Weeks    Status New      Long Term Additional Goals   Additional Long Term Goals Yes      OT LONG TERM GOAL #6   Title Pt will menu plan, shop(online or in store) and cook a mod complex meal from start to finish modified independently.    Time  12    Period Weeks    Status New      OT LONG TERM GOAL #7   Title Pt will verbalize recommendations regarding return to driving including driving eval prn.    Time 12    Period Weeks    Status New                 Plan - 03/06/21 0947     Clinical Impression Statement Pt progressing towards goals since evaluation. Pt continues to experience significant dicsomfort and pain on left side of body.    OT Occupational Profile and History Detailed Assessment- Review of Records and additional review of physical, cognitive, psychosocial history related to current functional performance    Occupational performance deficits (Please refer to evaluation for details): ADL's;IADL's;Leisure;Social Participation    Body Structure / Function / Physical Skills ADL;Endurance;UE functional use;Balance;Vision;Pain;Flexibility;FMC;ROM;Gait;GMC;Sensation;Decreased knowledge of precautions;Decreased knowledge of use of DME;IADL;Strength;Dexterity    Cognitive Skills Attention;Memory;Problem Solve;Safety Awareness;Sequencing;Temperament/Personality;Thought;Understand    Rehab Potential Good    Clinical Decision Making Limited treatment options, no task modification necessary    Comorbidities Affecting Occupational Performance: May have comorbidities impacting occupational performance    Modification or Assistance to Complete Evaluation  No modification of tasks or assist necessary to complete eval    OT Frequency 1x / week   plus eval   OT Duration 12 weeks    OT Treatment/Interventions Self-care/ADL training;Energy conservation;Visual/perceptual remediation/compensation;Patient/family education;DME and/or AE instruction;Paraffin;Passive range of motion;Balance training;Fluidtherapy;Cryotherapy;Therapist, nutritional;Therapeutic activities;Manual Therapy;Therapeutic exercise;Moist Heat;Neuromuscular education;Cognitive remediation/compensation    Plan continue to assess vision in functional context, environmental scanning in mod distracting environ, cooking    Consulted and Agree with Plan of Care Patient           Patient will benefit from skilled therapeutic intervention in order to improve the following deficits and impairments:   Body  Structure / Function / Physical Skills: ADL,Endurance,UE functional use,Balance,Vision,Pain,Flexibility,FMC,ROM,Gait,GMC,Sensation,Decreased knowledge of precautions,Decreased knowledge of use of DME,IADL,Strength,Dexterity Cognitive Skills: Attention,Memory,Problem Solve,Safety Awareness,Sequencing,Temperament/Personality,Thought,Understand     Visit Diagnosis: Muscle weakness (generalized)  Unsteadiness on feet  Frontal lobe and executive function deficit  Attention and concentration deficit  Other lack of coordination  Visuospatial deficit  Other abnormalities of gait and mobility    Problem List Patient Active Problem List   Diagnosis Date Noted  . Bacteremia   . Septic embolism ([place]) 02/10/2021  . Subacute endocarditis 02/10/2021  . Malnutrition of moderate degree 02/09/2021  . Acute ischemic stroke ([place]) 02/08/2021  . Infarction of right thalamus ([place]) 02/07/2021  . Essential hypertension 02/07/2021  . Paresthesia 02/02/2021  . Gait abnormality 02/02/2021  . Confusion 05/08/2018  . Stroke ([place]) 03/17/2017  . Type 2 diabetes mellitus with hyperlipidemia ([place]) 03/17/2017  . Confusion state 02/16/2017  . Angioedema 02/16/2017  . Panic 02/04/2017  . Adjustment disorder with anxious mood 02/04/2017  . Sinusitis, chronic 12/28/2011  . Rhinitis, allergic 12/28/2011  . Extrinsic asthma, unspecified 12/07/2011    Zachery Conch MOT, OTR/L  03/06/2021, 10:17 AM  [facility] [address] [place], [name], [address] Phone: [phone]   Fax:  [phone]  Name: Aimee Heldman MRN: [id number] Date of Birth: [birth year]

## 2021-03-06 NOTE — Therapy (Addendum)
[facility] [address] [place], [name], [address] Phone: [phone]   Fax:  [phone]  Physical Therapy Treatment  Patient Details  Name: Sheila Guerrero MRN: [id number] Date of Birth: Jun 19, 1961 Referring Provider (PT): Barb Merino (not reffered by, but previous neurologist has been Dr. Krista Blue)   Encounter Date: 03/06/2021   PT End of Session - 03/06/21 1107    Visit Number 2    Number of Visits 9    Authorization Type [employer] - $40 co pay per discipline    PT Start Time 1101    PT Stop Time 1143    PT Time Calculation (min) 42 min    Equipment Utilized During Treatment Gait belt    Activity Tolerance Patient tolerated treatment well    Behavior During Therapy [facility] for tasks assessed/performed           Past Medical History:  Diagnosis Date  . Angioedema   . Anxiety   . Asthma   . Depression   . Diabetes mellitus, type II ([place])   . Dyslipidemia   . Family history of adverse reaction to anesthesia    mother severely confused after general anesthesia  . History of CVA (cerebrovascular accident)   . HTN (hypertension) 2013  . Panic attacks   . Pneumonia   . Septic embolism ([place]) 02/10/2021  . Situational stress   . Subacute endocarditis 02/10/2021    Past Surgical History:  Procedure Laterality Date  . BUBBLE STUDY  02/10/2021   Procedure: BUBBLE STUDY;  Surgeon: Dixie Dials, MD;  Location: [facility];  Service: Cardiovascular;;  . CHOLECYSTECTOMY  2010  . TEE WITHOUT CARDIOVERSION N/A 02/10/2021   Procedure: TRANSESOPHAGEAL ECHOCARDIOGRAM (TEE);  Surgeon: Dixie Dials, MD;  Location: [facility] ENDOSCOPY;  Service: Cardiovascular;  Laterality: N/A;    Vitals:   03/06/21 1105  BP: 140/90  Pulse: (!) 104     Subjective Assessment - 03/06/21 1105    Subjective Pt saw her cardiologist and infectious disease doctor yesterday. Her carvedilol was increased to 6.25 mg bid starting today. Sees neurologist  next week. Pt has PICC line in right arm due to endocardidis.    Pertinent History history of hypertension, hyperlipidemia, type 2 diabetes on Metformin at home, anxiety and depression, history of a stroke in 2017, Angioedema    Limitations Walking    Diagnostic tests MRI showed small acute to early subacute infarct in the right  thalamus/posterior limb of internal capsule and small subacute left occipital lobe infarct.    Patient Stated Goals wants to know what she can do and shouldn't do    Currently in Pain? Yes    Pain Score 6     Pain Location Generalized    Pain Orientation Left    Pain Descriptors / Indicators Tingling;Numbness    Pain Type Acute pain;Neuropathic pain    Pain Onset More than a month ago    Pain Frequency Constant    Aggravating Factors  pressure on left    Pain Relieving Factors medication quiets the noise                             [facility] Adult PT Treatment/Exercise - 03/06/21 1108      Ambulation/Gait   Ambulation/Gait Yes , supervision with decreased stance time left and decreased left arm swing. No AD.   Ambulation Distance (Feet) 115 Feet      Neuro Re-ed  Neuro Re-ed Details  Along counter: marching walk without UE support 8' x 6, tandem walk with fingertip support 8' x 6. Standing on airex with feet apart: eyes closed x 30 sec, head turns left/right and up/down x 10 each, alternating toe taps on cone with 1 UE support x 10 then performing on floor x 10 with less support. CGA for safety with activities. Standing on rockerboard trying to maintain level x 30 sec eyes open then rocking board ant/post x 10. Pt reported feet very tired after.      Exercises   Exercises Other Exercises    Other Exercises  Seated left ankle DF with red theraband x 20. Standing bilateral toe raises back on heels x 10. Pt actually has less motion on right reporting old history of stress fracture in right foot.                  03/10/21 0818  PT  Education  Education Details Started in initial HEP  Person(s) Educated Patient  Methods Explanation;Demonstration  Comprehension Verbalized understanding;Returned demonstration      PT Short Term Goals - 02/25/21 1254      PT SHORT TERM GOAL #1   Title Pt will improve FGA score to at least a 20/30 in order to demo decr fall risk. ALL STGS DUE 03/25/21    Baseline 17/30    Time 4    Period Weeks    Status New    Target Date 03/25/21      PT SHORT TERM GOAL #2   Title Pt will improve gait speed with no AD to at least 2.9 ft/sec in order to demo improved community mobility.    Baseline 2.66 ft/sec with no AD    Time 4    Period Weeks    Status New      PT SHORT TERM GOAL #3   Title Pt will improve mCTSIB condition 4 to at least 15 seconds in order to demo improved vestibular input for balance.    Baseline 4 seconds    Time 4    Period Weeks    Status New             PT Long Term Goals - 02/25/21 1256      PT LONG TERM GOAL #1   Title Pt will be independent with final HEP in order to build upon functional gains made in therapy. ALL LTGS DUE 04/22/21    Time 8    Period Weeks    Status New    Target Date 04/22/21      PT LONG TERM GOAL #2   Title Pt will improve FGA score to at least a 23/30 in order to demo decr fall risk.    Baseline 17/30    Time 8    Period Weeks    Status New      PT LONG TERM GOAL #3   Title Pt will improve gait speed with no AD to at least 3.2 ft/sec in order to demo improved community mobility.    Baseline 2.66 ft/sec with no AD    Time 8    Period Weeks    Status New      PT LONG TERM GOAL #4   Title Pt will improve FOTO score to at least a 65 in order to demo improved functional outcomes.    Baseline 53    Time 8    Period Weeks    Status New  PT LONG TERM GOAL #5   Title Pt will ambulate at least 500' over outdoor paved/grass surfaces with supervision and no AD in order to demo improved community mobility.    Time 8     Period Weeks    Status New              03/10/21 0818  Plan  Clinical Impression Statement Pt continues to progress well with activities. PT worked more on higher level balance and ankle strengthening. Pt was less stable with SLS on left.  Personal Factors and Comorbidities Comorbidity 3+;Past/Current Experience;Time since onset of injury/illness/exacerbation (pt with $40 co pay per discipline)  Comorbidities history of hypertension, hyperlipidemia, type 2 diabetes, anxiety and depression, history of a stroke in 2017, Angioedema  Examination-Activity Limitations Stairs;Transfers;Locomotion Level  Examination-Participation Restrictions Community Activity;Driving  Pt will benefit from skilled therapeutic intervention in order to improve on the following deficits Abnormal gait;Decreased balance;Difficulty walking;Dizziness;Decreased strength;Impaired sensation;Postural dysfunction;Pain  Stability/Clinical Decision Making Evolving/Moderate complexity  Rehab Potential Good  PT Frequency 1x / week  PT Duration 8 weeks  PT Treatment/Interventions ADLs/Self Care Home Management;Gait training;Stair training;Functional mobility training;Therapeutic activities;Therapeutic exercise;Neuromuscular re-education;Balance training;DME Instruction;Patient/family education;Vestibular;Visual/perceptual remediation/compensation  PT Next Visit Plan * pt has PICC line in right arm*. Continue functional strengthening/balance (narrow BOS, SLS, head motions, eyes closed). high level balance         Patient will benefit from skilled therapeutic intervention in order to improve the following deficits and impairments:     Visit Diagnosis: Other abnormalities of gait and mobility  Muscle weakness (generalized)     Problem List Patient Active Problem List   Diagnosis Date Noted  . Bacteremia   . Septic embolism ([place]) 02/10/2021  . Subacute endocarditis 02/10/2021  . Malnutrition of moderate degree  02/09/2021  . Acute ischemic stroke ([place]) 02/08/2021  . Infarction of right thalamus ([place]) 02/07/2021  . Essential hypertension 02/07/2021  . Paresthesia 02/02/2021  . Gait abnormality 02/02/2021  . Confusion 05/08/2018  . Stroke ([place]) 03/17/2017  . Type 2 diabetes mellitus with hyperlipidemia ([place]) 03/17/2017  . Confusion state 02/16/2017  . Angioedema 02/16/2017  . Panic 02/04/2017  . Adjustment disorder with anxious mood 02/04/2017  . Sinusitis, chronic 12/28/2011  . Rhinitis, allergic 12/28/2011  . Extrinsic asthma, unspecified 12/07/2011    Electa Sniff, PT, DPT, NCS 03/06/2021, 8:09 PM  [facility] [address] [place], [name], [address] Phone: [phone]   Fax:  [phone]  Name: Preslea Rhodus MRN: [id number] Date of Birth: [birth year]

## 2021-03-06 NOTE — Patient Instructions (Addendum)
Access Code: [email or web address] URL: [email or web address] Date: 03/06/2021 Prepared by: Cherly Anderson  Exercises Walking March - 2 x daily - 7 x weekly - 1 sets - 3-4 reps Tandem Walking with Counter Support - 2 x daily - 7 x weekly - 1 sets - 3-4 reps Standing Balance with Eyes Closed on Foam - 2 x daily - 7 x weekly - 1 sets - 3 reps - 20 sec hold Standing head turns - 2 x daily - 7 x weekly - 2 sets - 10 reps, feet apart Step Taps on Low Step - 2 x daily - 7 x weekly - 1 sets - 10 reps

## 2021-03-06 NOTE — Patient Instructions (Signed)
  Coordination Activities  Perform the following activities for 20 minutes 2 times per day with left hand(s).   Rotate ball in fingertips (clockwise and counter-clockwise).  Toss ball between hands.  Toss ball in air and catch with the same hand.  Flip cards 1 at a time as fast as you can.  Deal cards with your thumb (Hold deck in hand and push card off top with thumb).  Shuffle cards.  Pick up coins and stack.  Pick up coins one at a time until you get 5-10 in your hand, then move coins from palm to fingertips to stack one at a time.  Twirl pen between fingers.  Screw together nuts and bolts, then unfasten.

## 2021-03-06 NOTE — Therapy (Signed)
[facility] [address] [place], [name], [address] Phone: [phone]   Fax:  [phone]  Speech Language Pathology Treatment  Patient Details  Name: Sheila Guerrero MRN: [id number] Date of Birth: 27-Jan-1961 Referring Provider (SLP): Dr. Barb Merino   Encounter Date: 03/06/2021   End of Session - 03/06/21 1022    Visit Number 2    Number of Visits 17    Date for SLP Re-Evaluation 04/22/21    Authorization Type due to high copay of $40 per discipline, Sheila is electing to attend 1x a week for 8 weeks    SLP Start Time 1016    SLP Stop Time  1100    SLP Time Calculation (min) 44 min    Activity Tolerance Patient tolerated treatment well           Past Medical History:  Diagnosis Date  . Angioedema   . Anxiety   . Asthma   . Depression   . Diabetes mellitus, type II ([place])   . Dyslipidemia   . Family history of adverse reaction to anesthesia    mother severely confused after general anesthesia  . History of CVA (cerebrovascular accident)   . HTN (hypertension) 2013  . Panic attacks   . Pneumonia   . Septic embolism ([place]) 02/10/2021  . Situational stress   . Subacute endocarditis 02/10/2021    Past Surgical History:  Procedure Laterality Date  . BUBBLE STUDY  02/10/2021   Procedure: BUBBLE STUDY;  Surgeon: Dixie Dials, MD;  Location: [facility];  Service: Cardiovascular;;  . CHOLECYSTECTOMY  2010  . TEE WITHOUT CARDIOVERSION N/A 02/10/2021   Procedure: TRANSESOPHAGEAL ECHOCARDIOGRAM (TEE);  Surgeon: Dixie Dials, MD;  Location: [facility] ENDOSCOPY;  Service: Cardiovascular;  Laterality: N/A;    There were no vitals filed for this visit.   Subjective Assessment - 03/06/21 1404    Subjective "I'm okay"    Currently in Pain? Yes    Pain Score 6     Pain Location Generalized    Pain Onset More than a month ago                 ADULT SLP TREATMENT - 03/06/21 1017      General Information    Behavior/Cognition Alert;Cooperative;Pleasant mood      Cognitive-Linquistic Treatment   Treatment focused on Cognition;Patient/family/caregiver education    Skilled Treatment Pt reports she has confusion re: times/dates, with example of arriving for ST eval on wrong day despite use of calendar. Pt indicated difficulty "putting the pieces together" re: knowing it's Friday and that is the start of the weekend. Pt reports independent use of compensations, including use of alarms/reminders on phone and password storage system for bills. Reduced problem solving and mental flexability suspected as pt unable to park unless she parks in one specific spot as she would not recall location or inability to ID which bill she hadn't paid if it wasn't delivered. SLP reviewed additional external memory aids to trial with emphasis to double check aid to confirm recall of accurate information.      Assessment / Recommendations / Plan   Plan Continue with current plan of care      Progression Toward Goals   Progression toward goals Progressing toward goals            SLP Education - 03/06/21 1404    Education Details external aids, memory compensations    Person(s) Educated Patient    Methods Explanation;Demonstration  Comprehension Verbalized understanding;Returned demonstration            SLP Short Term Goals - 03/06/21 1020      SLP SHORT TERM GOAL #1   Title Pt will use external aids to manage appointments and daily tasks with occasional min A over 2 sessions    Time 4    Period Weeks    Status On-going      SLP SHORT TERM GOAL #2   Title Pt will use external aids (such as grocery list organized by department) to successfully obtain all items needed with occasional min A over 2 shopping trips    Time 4    Period Weeks    Status On-going      SLP SHORT TERM GOAL #3   Title Pt will use compensatory strategies for cognition to pay 3 bills with rare min A from spouse    Time 4    Period  Weeks    Status On-going      SLP SHORT TERM GOAL #4   Title Pt will use visual reminder to lock doors prior to leaving and bedtime with rare min A over 1 week    Time 4    Period Weeks    Status On-going            SLP Long Term Goals - 03/06/21 1021      SLP LONG TERM GOAL #1   Title Pt will use external aids to complete 3 household tasks a day without leaving them incomplete before intiatating next task with rare min A    Time 8    Period Weeks    Status On-going      SLP LONG TERM GOAL #2   Title Pt will report no "mix up" in appointments or schedules over 2 weeks    Time 8    Period Weeks    Status On-going      SLP LONG TERM GOAL #3   Title Pt will manage all household bills with supervision from spouse over 1 month    Time 8    Period Weeks    Status On-going      SLP LONG TERM GOAL #4   Title Pt will carryover memory strategies to recall 3 details from 4 paragraph written passage with occasional min A    Time 8    Period Weeks    Status On-going      SLP LONG TERM GOAL #5   Title Pt will score a 15 or lower on Neuro-QOL-Cognitive Function- Short Form patient related outcome measure    Time 8    Period Weeks    Status On-going            Plan - 03/06/21 1505    Clinical Impression Statement Sheila Guerrero is referred to outpatient ST due to cognitive communication impairments s/p CVA 12/2020. SLP reviewed current cognitive difficulties, with pt reporting some independent implementation of modifications and compensations to reduce cognitive burden on self (ex: alarms, GPS, written aids). SLP provided additional recommendations for external memory aids to improve recall and techniques to improve comprehension and confirmation of accurate information. I recommend skilled ST to maximize cognition for indepencence, safety and to return to PLOF for household management and community participation.    Speech Therapy Frequency 2x / week    Duration 8 weeks   or 17  total visits   Treatment/Interventions Environmental controls;Cueing hierarchy;SLP instruction and feedback;Compensatory strategies;Functional tasks;Cognitive reorganization;Compensatory techniques;Internal/external aids;Multimodal communcation approach;Patient/family  education;Language facilitation    Potential to Achieve Goals Good    Consulted and Agree with Plan of Care Patient           Patient will benefit from skilled therapeutic intervention in order to improve the following deficits and impairments:   Cognitive communication deficit    Problem List Patient Active Problem List   Diagnosis Date Noted  . Bacteremia   . Septic embolism ([place]) 02/10/2021  . Subacute endocarditis 02/10/2021  . Malnutrition of moderate degree 02/09/2021  . Acute ischemic stroke ([place]) 02/08/2021  . Infarction of right thalamus ([place]) 02/07/2021  . Essential hypertension 02/07/2021  . Paresthesia 02/02/2021  . Gait abnormality 02/02/2021  . Confusion 05/08/2018  . Stroke ([place]) 03/17/2017  . Type 2 diabetes mellitus with hyperlipidemia ([place]) 03/17/2017  . Confusion state 02/16/2017  . Angioedema 02/16/2017  . Panic 02/04/2017  . Adjustment disorder with anxious mood 02/04/2017  . Sinusitis, chronic 12/28/2011  . Rhinitis, allergic 12/28/2011  . Extrinsic asthma, unspecified 12/07/2011    Alinda Deem, MA CCC-SLP 03/06/2021, 3:12 PM  [facility] [address] [place], [name], [address] Phone: [phone]   Fax:  [phone]   Name: Sheila Guerrero MRN: [id number] Date of Birth: 1961-06-28

## 2021-03-10 ENCOUNTER — Ambulatory Visit: Payer: 59 | Admitting: Neurology

## 2021-03-10 ENCOUNTER — Encounter: Payer: Self-pay | Admitting: Neurology

## 2021-03-10 VITALS — BP 137/87 | HR 87 | Ht 65.0 in | Wt 177.0 lb

## 2021-03-10 DIAGNOSIS — I639 Cerebral infarction, unspecified: Secondary | ICD-10-CM | POA: Diagnosis not present

## 2021-03-10 DIAGNOSIS — I339 Acute and subacute endocarditis, unspecified: Secondary | ICD-10-CM | POA: Diagnosis not present

## 2021-03-10 DIAGNOSIS — R269 Unspecified abnormalities of gait and mobility: Secondary | ICD-10-CM | POA: Diagnosis not present

## 2021-03-10 NOTE — Progress Notes (Signed)
Reason for visit: Stroke  Sheila Guerrero is a 60 y.o. female  History of present illness:  Sheila Guerrero is a 60 year old right-handed white female with a history of cerebrovascular disease, with a stroke that occurred in 2017.  The patient has diabetes, hypertension, and dyslipidemia.  She does not smoke cigarettes or drink alcohol.  She has had some problems with memory and chronic fatigue since her stroke in 2017.  The patient has had very poorly controlled diabetes recently, her most recent hemoglobin A1c was 12.1.  On 12 January 2021, the patient noted onset of left-sided sensory alteration including the face, arm, and leg.  The patient also reports some slight weakness and clumsiness on the left side and changing gait with increased fatigue and increased problems with cognitive processing.  The patient was seen by Dr. Krista Blue and was set up for MRI of the brain, the patient however began having some significant back pain on the right, she went to the emergency room for evaluation and was subsequently admitted for stroke evaluation.  MRI of the brain revealed evidence of a right thalamic stroke, MRA of the head showed severe stenosis of the right middle cerebral artery.  The patient had a carotid Doppler study that was unremarkable.  A 2D echocardiogram was done and eventually a TEE was performed and revealed evidence of vegetations on the mitral and tricuspid valves.  The patient was seen by infectious disease, blood cultures were negative but the decision was made to treat with antibiotics.  The patient is currently getting an antibiotic course.  She has a history of frequent angioedema events, she is sensitive to many medications.  The patient denies any headaches or double vision, she denies issues controlling the bowels or the bladder.  She has had some night sweats since the stroke but not prior to the stroke.  She has had some gradual weight loss for 2 years prior to the stroke, she has lost  a total of about 50 pounds.  She does not report any new flulike symptoms.  She currently is getting physical, occupational, and speech therapy evaluations.  She on a combination of aspirin and Plavix for now, she will eventually be converted to aspirin alone.  She comes to this office for an evaluation.  Past Medical History:  Diagnosis Date  . Angioedema   . Anxiety   . Asthma   . Depression   . Diabetes mellitus, type II ([place])   . Dyslipidemia   . Family history of adverse reaction to anesthesia    mother severely confused after general anesthesia  . History of CVA (cerebrovascular accident)   . HTN (hypertension) 2013  . Panic attacks   . Pneumonia   . Septic embolism ([place]) 02/10/2021  . Situational stress   . Subacute endocarditis 02/10/2021    Past Surgical History:  Procedure Laterality Date  . BUBBLE STUDY  02/10/2021   Procedure: BUBBLE STUDY;  Surgeon: Dixie Dials, MD;  Location: [facility];  Service: Cardiovascular;;  . CHOLECYSTECTOMY  2010  . TEE WITHOUT CARDIOVERSION N/A 02/10/2021   Procedure: TRANSESOPHAGEAL ECHOCARDIOGRAM (TEE);  Surgeon: Dixie Dials, MD;  [facility] ENDOSCOPY;  Service: Cardiovascular;  Laterality: N/A;    Family History  Problem Relation Age of Onset  . Hypertension Mother   . Other Mother        blood disorder  . Hypertension Father   . Stroke Father   . Diabetes Maternal Aunt     Social history:  reports  that she has never smoked. She has never used smokeless tobacco. She reports current alcohol use. She reports that she does not use drugs.  Medications:  Prior to Admission medications   Medication Sig Start Date End Date Taking? Authorizing Provider  acetaminophen (TYLENOL) 500 MG tablet Take 1,000 mg by mouth every 6 (six) hours as needed for moderate pain.   Yes [provider]  aspirin EC 81 MG EC tablet Take 1 tablet (81 mg total) by mouth daily. Swallow whole. 02/13/21  Yes Barb Merino, MD  CALCIUM-VITAMIN D PO  Take 1 tablet by mouth daily.   Yes [provider]  carvedilol (COREG) 3.125 MG tablet Take 1 tablet (3.125 mg total) by mouth 2 (two) times daily with a meal. Patient taking differently: Take 6.25 mg by mouth 2 (two) times daily with a meal. 02/12/21 05/13/21 Yes Ghimire, Dante Gang, MD  cefTRIAXone (ROCEPHIN) IVPB Inject 2 g into the vein every 12 (twelve) hours. Indication: Endocarditis First Dose: No Last Day of Therapy:  03/18/2021 Labs - Once weekly:  CBC/D and BMP, Labs - Every other week:  ESR and CRP Method of administration: IV Push Method of administration may be changed at the discretion of home infusion pharmacist based upon assessment of the patient and/or caregiver's ability to self-administer the medication ordered. 02/12/21 03/18/21 Yes Ghimire, Dante Gang, MD  CINNAMON PO Take 1 tablet by mouth daily.   Yes [provider]  daptomycin (CUBICIN) IVPB Inject 800 mg into the vein daily. Indication:  Endocarditis First Dose: No Last Day of Therapy:  03/18/2021 Labs - Once weekly:  CBC/D, BMP, and CPK Labs - Every other week:  ESR and CRP Method of administration: IV Push Method of administration may be changed at the discretion of home infusion pharmacist based upon assessment of the patient and/or caregiver's ability to self-administer the medication ordered. 02/12/21 03/18/21 Yes Ghimire, Dante Gang, MD  EPINEPHrine 0.3 mg/0.3 mL IJ SOAJ injection Inject 0.3 mg into the muscle daily as needed.   Yes [provider]  insulin detemir (LEVEMIR) 100 UNIT/ML FlexPen Inject 20 Units into the skin at bedtime. 02/12/21  Yes Ghimire, Dante Gang, MD  Insulin Pen Needle (PEN NEEDLES) 29G X 12MM MISC 1 each by Does not apply route daily. 02/12/21  Yes Ghimire, Dante Gang, MD  melatonin 3 MG TABS tablet Take 3 mg by mouth daily as needed. 03/02/16  Yes [provider]  metFORMIN (GLUCOPHAGE-XR) 500 MG 24 hr tablet Take 2,000 mg by mouth daily. 01/29/21  Yes [provider]   pregabalin (LYRICA) 75 MG capsule Take 150 mg by mouth 2 (two) times daily. 01/21/21  Yes [provider]  sacubitril-valsartan (ENTRESTO) 24-26 MG Take 1 tablet by mouth 2 (two) times daily. 02/12/21 05/13/21 Yes Ghimire, Dante Gang, MD  traMADol (ULTRAM) 50 MG tablet Take 50 mg by mouth 2 (two) times daily as needed. 02/27/21  Yes [provider]  Ascorbic Acid (VITAMIN C) 500 MG CAPS Take 500 mg by mouth daily.    [provider]  atorvastatin (LIPITOR) 20 MG tablet Take 1 tablet (20 mg total) by mouth daily. Not to be taken until finishing antibiotics 02/12/21   Barb Merino, MD  Cholecalciferol (VITAMIN D-3) 5000 UNIT/ML LIQD Take 1 tablet by mouth daily.    [provider]  Flaxseed, Linseed, (FLAX SEED OIL) 1000 MG CAPS Take 1 capsule by mouth daily.    [provider]  lidocaine (LIDODERM) 5 % Place 1 patch onto the skin daily. Remove &  Discard patch within 12 hours or as directed by MD 02/13/21   Barb Merino, MD  MAGNESIUM GLUCONATE PO Take 1 tablet by mouth daily.    [provider]  OIL OF OREGANO PO Take 1 capsule by mouth daily.    [provider]  OLIVE LEAF EXTRACT PO Take 1 capsule by mouth daily.    [provider]  omega-3 fish oil (MAXEPA) 1000 MG CAPS capsule Take 1 capsule by mouth daily.    [provider]  sodium chloride (OCEAN) 0.65 % SOLN nasal spray Place 1 spray into both nostrils daily as needed for congestion.    [provider]  Tragacanth (ASTRAGALUS ROOT) POWD Take 1 Dose by mouth daily.    [provider]  traZODone (DESYREL) 100 MG tablet Take 100 mg by mouth at bedtime. 01/21/21   [provider]  Turmeric 500 MG CAPS Take 1 tablet by mouth daily.    [provider]      Allergies  Allergen Reactions  . Sulfa Antibiotics Swelling  . Dulaglutide     Other reaction(s): rash  . Empagliflozin     Other reaction(s): recurrent yeast  . Iodinated  Diagnostic Agents     Other reaction(s): angioedema with CT dye  . Latex Rash    ROS:  Out of a complete 14 system review of symptoms, the patient complains only of the following symptoms, and all other reviewed systems are negative.  Fatigue Walking difficulty Left-sided numbness Memory problems  Blood pressure 137/87, pulse 87, height $RemoveBe'5\' 5"'nHakKVZre$  (1.651 m), weight 177 lb (80.3 kg), last menstrual period 11/25/2011.  Physical Exam  General: The patient is alert and cooperative at the time of the examination.  The patient is moderately obese, with central obesity.  Eyes: Pupils are equal, round, and reactive to light. Discs are flat bilaterally.  Neck: The neck is supple, no carotid bruits are noted.  Respiratory: The respiratory examination is clear.  Cardiovascular: The cardiovascular examination reveals a regular rate and rhythm, no obvious murmurs or rubs are noted.  Skin: Extremities are without significant edema.  Neurologic Exam  Mental status: The patient is alert and oriented x 3 at the time of the examination. The patient has apparent normal recent and remote memory, with an apparently normal attention span and concentration ability.  Cranial nerves: Facial symmetry is present. There is good sensation of the face to pinprick and soft touch bilaterally. The strength of the facial muscles and the muscles to head turning and shoulder shrug are normal bilaterally. Speech is well enunciated, no aphasia or dysarthria is noted. Extraocular movements are full. Visual fields are full. The tongue is midline, and the patient has symmetric elevation of the soft palate. No obvious hearing deficits are noted.  Motor: The motor testing reveals 5 over 5 strength of all 4 extremities. Good symmetric motor tone is noted throughout.  Sensory: Sensory testing is intact to pinprick, soft touch, vibration sensation, and position sense on the right extremities.  There is some decreased pinprick  sensation on the left arm and legs compared to the right, vibration sensation is symmetric in the arms, decreased on the left foot.  No evidence of extinction is noted.  Coordination: Cerebellar testing reveals good finger-nose-finger and heel-to-shin bilaterally, with exception of some slight dysmetria with finger-nose-finger with the left hand..  Gait and station: Gait is somewhat wide-based, the patient can walk independently.  Tandem gait is unsteady.  Romberg is negative but is unsteady.  Reflexes: Deep tendon reflexes are symmetric and normal bilaterally. Toes are downgoing bilaterally.   MRI brain 02/07/21:  IMPRESSION: 1. Small acute to early subacute infarct in the right thalamus/posterior limb of internal capsule. 2. Small subacute left occipital lobe infarct. 3. Moderately advanced chronic small vessel ischemic disease, progressed from 2018. 4. Numerous chronic microhemorrhages consistent with chronic hypertension.  * MRI scan images were reviewed online. I agree with the written report.   MRA head 02/07/21:  IMPRESSION: 1. Intracranial atherosclerosis including severe right M1 and moderate right V4 stenoses. 2. No large vessel occlusion.   Carotid doppler 02/10/21:  Summary:  Right Carotid: Velocities in the right ICA are consistent with a 1-39%  stenosis.   Left Carotid: Velocities in the left ICA are consistent with a 1-39%  stenosis.   Vertebrals: Bilateral vertebral arteries demonstrate antegrade flow.  Subclavians: Normal flow hemodynamics were seen in bilateral subclavian        arteries.    TEE 02/10/21:  IMPRESSIONS    1. Left ventricular ejection fraction, by estimation, is 45 to 50%. The  left ventricle has mildly decreased function. The left ventricle  demonstrates global hypokinesis. Left ventricular diastolic function could  not be evaluated.  2. Right ventricular systolic function is normal. The right ventricular  size is normal.   3. Left atrial size was moderately dilated. No left atrial/left atrial  appendage thrombus was detected.  4. Right atrial size was mildly dilated.  5. The pericardial effusion is posterior to the left ventricle. There is  no evidence of cardiac tamponade.  6. Mobile small MV vegetations seen on both leaflets. The mitral valve is  normal in structure. Moderate mitral valve regurgitation.  7. Sessile cauliflower shaped vegetation measuring 0.428 x 0.712 cm on  anterior leaflet mid portion of TV.  8. The aortic valve is tricuspid. Aortic valve regurgitation is not  visualized. No aortic stenosis is present.  9. There is mild (Grade II) atheroma plaque involving the descending  aorta and ascending aorta.  10. The inferior vena cava is normal in size with greater than 50%  respiratory variability, suggesting right atrial pressure of 3 mmHg.  11. Evidence of atrial level shunting detected by color flow Doppler.  Agitated saline contrast bubble study was negative, with no evidence of  any interatrial shunt. There is a small patent foramen ovale with  predominantly right to left shunting across  the atrial septum.      Assessment/Plan:  1.  Recent right thalamic stroke  2.  Endocarditis  3.  Diabetes  4.  Hypertension  5.  Dyslipidemia  The patient has had a lipid panel that shows a good level of LDL, her blood pressure today is not significantly elevated.  The patient is to remain on aspirin Plavix and then eventually go to aspirin alone after 3 weeks.  The patient is being treated for the endocarditis, further blood work will be done today in this regard looking for noninfectious causes of endocarditis.  The patient will continue her therapy sessions.  She will follow up here in 3 months.  Sheila Alexanders MD 03/10/2021 11:56 AM  [facility] [address] [place], [place] [address]  Phone [phone] Fax [phone]

## 2021-03-11 ENCOUNTER — Encounter: Payer: Self-pay | Admitting: Occupational Therapy

## 2021-03-11 ENCOUNTER — Ambulatory Visit: Payer: 59

## 2021-03-11 ENCOUNTER — Ambulatory Visit: Payer: 59 | Admitting: Occupational Therapy

## 2021-03-11 ENCOUNTER — Ambulatory Visit: Payer: 59 | Admitting: Speech Pathology

## 2021-03-11 ENCOUNTER — Encounter: Payer: Self-pay | Admitting: Speech Pathology

## 2021-03-11 ENCOUNTER — Other Ambulatory Visit: Payer: Self-pay

## 2021-03-11 DIAGNOSIS — M6281 Muscle weakness (generalized): Secondary | ICD-10-CM

## 2021-03-11 DIAGNOSIS — R41841 Cognitive communication deficit: Secondary | ICD-10-CM

## 2021-03-11 DIAGNOSIS — R2689 Other abnormalities of gait and mobility: Secondary | ICD-10-CM | POA: Diagnosis not present

## 2021-03-11 DIAGNOSIS — R2681 Unsteadiness on feet: Secondary | ICD-10-CM

## 2021-03-11 DIAGNOSIS — R41840 Attention and concentration deficit: Secondary | ICD-10-CM

## 2021-03-11 DIAGNOSIS — R278 Other lack of coordination: Secondary | ICD-10-CM

## 2021-03-11 DIAGNOSIS — R41842 Visuospatial deficit: Secondary | ICD-10-CM

## 2021-03-11 DIAGNOSIS — R41844 Frontal lobe and executive function deficit: Secondary | ICD-10-CM

## 2021-03-11 NOTE — Therapy (Signed)
[facility] [address] [place], [name], [address] Phone: [phone]   Fax:  [phone]  Occupational Therapy Treatment  Patient Details  Name: Sheila Guerrero MRN: [id number] Date of Birth: 1961-01-31 Referring Provider (OT): Dr. Nigel Bridgeman   Encounter Date: 03/11/2021   OT End of Session - 03/11/21 1103    Visit Number 3    Number of Visits 13    Date for OT Re-Evaluation 05/27/21    Authorization Type [employer]    Authorization Time Period POC written for 12 weks anticipate d/c after 8 weeks    Authorization - Visit Number 3    Authorization - Number of Visits 90   combined   OT Start Time 1103    OT Stop Time 1145    OT Time Calculation (min) 42 min    Activity Tolerance Patient tolerated treatment well           Past Medical History:  Diagnosis Date  . Angioedema   . Anxiety   . Asthma   . Depression   . Diabetes mellitus, type II ([place])   . Dyslipidemia   . Family history of adverse reaction to anesthesia    mother severely confused after general anesthesia  . History of CVA (cerebrovascular accident)   . HTN (hypertension) 2013  . Panic attacks   . Pneumonia   . Septic embolism ([place]) 02/10/2021  . Situational stress   . Subacute endocarditis 02/10/2021    Past Surgical History:  Procedure Laterality Date  . BUBBLE STUDY  02/10/2021   Procedure: BUBBLE STUDY;  Surgeon: Dixie Dials, MD;  Location: [facility];  Service: Cardiovascular;;  . CHOLECYSTECTOMY  2010  . TEE WITHOUT CARDIOVERSION N/A 02/10/2021   Procedure: TRANSESOPHAGEAL ECHOCARDIOGRAM (TEE);  Surgeon: Dixie Dials, MD;  Location[facility] ENDOSCOPY;  Service: Cardiovascular;  Laterality: N/A;    There were no vitals filed for this visit.   Subjective Assessment - 03/11/21 1103    Subjective  I threw my back out last PT session and so I am on more pain killers and ice packs all day.    Pertinent History 60 year old female with past  medical history of angioedema, hypertension, hyperlipidemia, type 2 diabetes, anxiety, depression, type 2 diabetes, CVA in 2017, new CVA March 02/07/21    Patient Stated Goals LUE weakness    Currently in Pain? Yes    Pain Score 7     Pain Location Back    Pain Orientation Right    Pain Descriptors / Indicators Burning    Pain Type Acute pain    Pain Onset More than a month ago    Pain Frequency Constant    Aggravating Factors  marching/walking    Pain Relieving Factors chiropractor but he is out of town so she can't get in to see him           TREATMENT:  ADLs: pt reports assisting with cooking yesterday. Pt reported trying to soft boil eggs this morning but forgot about the water on the stove and husband had to attend to the eggs.   Constant Therapy Read a Map level 1 with 100% accuracy and 16.18s response time. Level 3 with 70% accuracy and 45.89s response time. Cues req'd for recalling ability to make picture bigger. Pt reports it makes her "dizzy" to look at that much detail on a picture. Pt reported just guessing on some of the questions cause she could not locate the items.   Grooved Pegs with LUE  with no drops and little difficulty with manipulation of pegs.   Hand Gripper: with LUE on level 2 with black spring. Pt picked up 1 inch blocks with gripper with mod drops and mod difficulty. Pt req'd several breaks d/t fatigue and weakness. Downgraded to level 1 with black spring for last approx 20 blocks.                      OT Education - 03/11/21 1133    Education Details issued red theraputty - see pt instructions    Person(s) Educated Patient    Methods Explanation;Demonstration    Comprehension Verbalized understanding;Returned demonstration            OT Short Term Goals - 03/11/21 1112      OT SHORT TERM GOAL #1   Title I with inital HEP (including coordination)    Time 6    Period Weeks    Status On-going   issued coordination HEP   Target  Date 03/25/21      OT SHORT TERM GOAL #2   Title Pt will verbalize understanding of compensatory strategies for visual and cognitive deficits.    Time 4    Period Weeks    Status On-going      OT SHORT TERM GOAL #3   Title Pt will perform basic cooking and home management modified independently demonstrating good safety awareness.    Time 4    Period Weeks    Status On-going      OT SHORT TERM GOAL #4   Title I with sensory precautions for LUE and will demonstrate understanding of compensations during functional activity.    Time 4    Period Weeks    Status New      OT SHORT TERM GOAL #5   Title Pt will locate items in a mod distracting environment with 90% or better accuracy in prep for driving/ grocery shopping.    Time 4    Period Weeks    Status New      OT SHORT TERM GOAL #6   Title Pt will demonstrate improved fine motor coordination as evidenced by decreasing 9 hole peg test score to 35 secs or less with LUE.    Baseline RUE 24.53, LUE 39.22    Time 4    Period Weeks    Status New      OT SHORT TERM GOAL #7   Title I with bed positioning to minimize LUE pain at night time.    Time 4    Period Weeks    Status New             OT Long Term Goals - 02/25/21 1002      OT LONG TERM GOAL #1   Title I with updated HEP for LUE strength.    Time 12    Target Date 05/20/21      OT LONG TERM GOAL #2   Title Pt will demonstrate ability to carry a cup of water in 1 hand and a plate in the other without drops or spills.    Time 12    Period Weeks    Status New      OT LONG TERM GOAL #3   Title Pt will increase LUE grip strength to 48 lbs or greater for increased functional use.    Time 12    Period Weeks    Status New      OT LONG TERM GOAL #4  Title Pt will perform a physical and cogntive task simultaneously with no LOB and 90% or better accuracy.    Time 12    Period Weeks    Status New      OT LONG TERM GOAL #5   Title Pt will complete FOTO survey at  d/c.    Time 12    Period Weeks    Status New      Long Term Additional Goals   Additional Long Term Goals Yes      OT LONG TERM GOAL #6   Title Pt will menu plan, shop(online or in store) and cook a mod complex meal from start to finish modified independently.    Time 12    Period Weeks    Status New      OT LONG TERM GOAL #7   Title Pt will verbalize recommendations regarding return to driving including driving eval prn.    Time 12    Period Weeks    Status New                 Plan - 03/11/21 1105    Clinical Impression Statement Pt progressing towards goals since evaluation. Pt continues to experience significant dicsomfort and pain on left side of body.    OT Occupational Profile and History Detailed Assessment- Review of Records and additional review of physical, cognitive, psychosocial history related to current functional performance    Occupational performance deficits (Please refer to evaluation for details): ADL's;IADL's;Leisure;Social Participation    Body Structure / Function / Physical Skills ADL;Endurance;UE functional use;Balance;Vision;Pain;Flexibility;FMC;ROM;Gait;GMC;Sensation;Decreased knowledge of precautions;Decreased knowledge of use of DME;IADL;Strength;Dexterity    Cognitive Skills Attention;Memory;Problem Solve;Safety Awareness;Sequencing;Temperament/Personality;Thought;Understand    Rehab Potential Good    Clinical Decision Making Limited treatment options, no task modification necessary    Comorbidities Affecting Occupational Performance: May have comorbidities impacting occupational performance    Modification or Assistance to Complete Evaluation  No modification of tasks or assist necessary to complete eval    OT Frequency 1x / week   plus eval   OT Duration 12 weeks    OT Treatment/Interventions Self-care/ADL training;Energy conservation;Visual/perceptual remediation/compensation;Patient/family education;DME and/or AE instruction;Paraffin;Passive  range of motion;Balance training;Fluidtherapy;Cryotherapy;Therapist, nutritional;Therapeutic activities;Manual Therapy;Therapeutic exercise;Moist Heat;Neuromuscular education;Cognitive remediation/compensation    Plan continue to assess vision in functional context, environmental scanning in mod distracting environ, cooking    Consulted and Agree with Plan of Care Patient           Patient will benefit from skilled therapeutic intervention in order to improve the following deficits and impairments:   Body Structure / Function / Physical Skills: ADL,Endurance,UE functional use,Balance,Vision,Pain,Flexibility,FMC,ROM,Gait,GMC,Sensation,Decreased knowledge of precautions,Decreased knowledge of use of DME,IADL,Strength,Dexterity Cognitive Skills: Attention,Memory,Problem Solve,Safety Awareness,Sequencing,Temperament/Personality,Thought,Understand     Visit Diagnosis: Muscle weakness (generalized)  Unsteadiness on feet  Frontal lobe and executive function deficit  Attention and concentration deficit  Other lack of coordination  Visuospatial deficit    Problem List Patient Active Problem List   Diagnosis Date Noted  . Bacteremia   . Septic embolism ([place]) 02/10/2021  . Subacute endocarditis 02/10/2021  . Malnutrition of moderate degree 02/09/2021  . Acute ischemic stroke ([place]) 02/08/2021  . Infarction of right thalamus ([place]) 02/07/2021  . Essential hypertension 02/07/2021  . Paresthesia 02/02/2021  . Gait abnormality 02/02/2021  . Confusion 05/08/2018  . Stroke ([place]) 03/17/2017  . Type 2 diabetes mellitus with hyperlipidemia ([place]) 03/17/2017  . Confusion state 02/16/2017  . Angioedema 02/16/2017  . Panic 02/04/2017  . Adjustment disorder with anxious mood  02/04/2017  . Sinusitis, chronic 12/28/2011  . Rhinitis, allergic 12/28/2011  . Extrinsic asthma, unspecified 12/07/2011    Zachery Conch MOT, OTR/L  03/11/2021, 11:35 AM  [facility] [address] [place], [name], [address] Phone: [phone]   Fax:  [phone]  Name: Sheila Guerrero MRN: [id number] Date of Birth: 1961/05/26

## 2021-03-11 NOTE — Patient Instructions (Signed)
   In the kitchen, don't leave until the job is complete  When you are on a task, don't leave the room until the job is done   Make a list of your daily to do's - double check that all of the steps are completed before crossing off the chore - you will think you have completed it, but walk the steps to double check  Consider an extra leash with a key attached to the  for you to use so you can lock the door when you walk the dog  Get important information in writing  Try using an index card when reading to take the work off of your brain of staying on the correct line/place  Alveta Heimlich should continue to try to avoid interrupting you while you are working, cooking, concentrating  Tips to help facilitate better attention, concentration, focus   Do harder, longer tasks when you are most alert/awake (like reading your mom's med info)  Break down larger tasks into small parts  Limit distractions of TV, radio, conversation, e mails/texts, appliance noise, etc - if a job is important, do it in a quiet room  Be aware of how you are functioning in high stimulation environments such as large stores, parties, restaurants - any place with lots of lights, noise, signs etc  Group conversations may be more difficult to process than one on one conversations  Give yourself extra time to process conversation, reading materials, directions or information from your healthcare providers  Organization is key - clutters of laundry, mail, paperwork, dirty dishes - all make it more difficult to concentrate  Before you start a task, have all the needed supplies, directions, recipes ready and organized. This way you don't have to go looking for something in the middle of a task and become distracted.   Be aware of fatigue - take rests or breaks when needed to re-group and re-focus    Get the [name]'s attention before you speak  Use eye contact and face her when you are speaking to  Be in close proximity to  the person you are speaking to  Turn down any noise in the environment such as the TV, walk away from loud appliances, air conditioners, fans, dish washers etc

## 2021-03-11 NOTE — Therapy (Signed)
[facility] [address] [place], [name], [address] Phone: [phone]   Fax:  [phone]  Speech Language Pathology Treatment  Patient Details  Name: Sheila Guerrero MRN: [id number] Date of Birth: 10-26-1961 Referring Provider (SLP): Dr. Barb Merino   Encounter Date: 03/11/2021   End of Session - 03/11/21 1438    Visit Number 3    Number of Visits 17    Date for SLP Re-Evaluation 04/22/21    Authorization Type due to high copay of $40 per discipline, [name] is electing to attend 1x a week for 8 weeks    SLP Start Time 0930    SLP Stop Time  1013    SLP Time Calculation (min) 43 min    Activity Tolerance Patient tolerated treatment well           Past Medical History:  Diagnosis Date  . Angioedema   . Anxiety   . Asthma   . Depression   . Diabetes mellitus, type II ([place])   . Dyslipidemia   . Family history of adverse reaction to anesthesia    mother severely confused after general anesthesia  . History of CVA (cerebrovascular accident)   . HTN (hypertension) 2013  . Panic attacks   . Pneumonia   . Septic embolism ([place]) 02/10/2021  . Situational stress   . Subacute endocarditis 02/10/2021    Past Surgical History:  Procedure Laterality Date  . BUBBLE STUDY  02/10/2021   Procedure: BUBBLE STUDY;  Surgeon: Dixie Dials, MD;  Location: [facility];  Service: Cardiovascular;;  . CHOLECYSTECTOMY  2010  . TEE WITHOUT CARDIOVERSION N/A 02/10/2021   Procedure: TRANSESOPHAGEAL ECHOCARDIOGRAM (TEE);  Surgeon: Dixie Dials, MD;  Location[facility] ENDOSCOPY;  Service: Cardiovascular;  Laterality: N/A;    There were no vitals filed for this visit.   Subjective Assessment - 03/11/21 0932    Subjective "I had a bad weekend"    Currently in Pain? Yes    Pain Score 7     Pain Location Generalized    Pain Orientation Left    Pain Descriptors / Indicators Tingling;Numbness    Pain Type Acute pain;Neuropathic  pain    Pain Onset More than a month ago    Pain Frequency Constant                 ADULT SLP TREATMENT - 03/11/21 0933      General Information   Behavior/Cognition Alert;Cooperative;Pleasant mood      Treatment Provided   Treatment provided Cognitive-Linquistic      Cognitive-Linquistic Treatment   Treatment focused on Cognition;Patient/family/caregiver education    [place]  continues to report forgetting to complete tasks starts, only to find tasks undone she though she did. We generated strategy of using a to do list, which she can cross off only when she has double checked that all steps in the task are complete. Instructed her to "walk the steps" to ensure completion. She also forgets to lock the door, specifically when she walks her dog. We generated strategy of tying a spare key to her dog leash as external reminder. Trained [name] in energy conservation and strategies to reduce fatigue and brain fog throughout the day. See pt instructions.      Assessment / Recommendations / Plan   Plan Continue with current plan of care      Progression Toward Goals   Progression toward goals Progressing toward goals  SLP Education - 03/11/21 1435    Education Details external aids, compensations for attention, compensations for energy conservation    Person(s) Educated Patient    Methods Explanation;Demonstration;Verbal cues;Handout    Comprehension Verbalized understanding;Verbal cues required;Need further instruction            SLP Short Term Goals - 03/11/21 1437      SLP SHORT TERM GOAL #1   Title Pt will use external aids to manage appointments and daily tasks with occasional min A over 2 sessions    Time 3    Period Weeks    Status On-going      SLP SHORT TERM GOAL #2   Title Pt will use external aids (such as grocery list organized by department) to successfully obtain all items needed with occasional min A over 2 shopping trips     Time 3    Period Weeks    Status On-going      SLP SHORT TERM GOAL #3   Title Pt will use compensatory strategies for cognition to pay 3 bills with rare min A from spouse    Time 3    Period Weeks    Status On-going      SLP SHORT TERM GOAL #4   Title Pt will use visual reminder to lock doors prior to leaving and bedtime with rare min A over 1 week    Time 3    Period Weeks    Status On-going            SLP Long Term Goals - 03/11/21 1437      SLP LONG TERM GOAL #1   Title Pt will use external aids to complete 3 household tasks a day without leaving them incomplete before intiatating next task with rare min A    Time 7    Period Weeks    Status On-going      SLP LONG TERM GOAL #2   Title Pt will report no "mix up" in appointments or schedules over 2 weeks    Time 7    Period Weeks    Status On-going      SLP LONG TERM GOAL #3   Title Pt will manage all household bills with supervision from spouse over 1 month    Time 7    Period Weeks    Status On-going      SLP LONG TERM GOAL #4   Title Pt will carryover memory strategies to recall 3 details from 4 paragraph written passage with occasional min A    Time 7    Period Weeks    Status On-going      SLP LONG TERM GOAL #5   Title Pt will score a 15 or lower on Neuro-QOL-Cognitive Function- Short Form patient related outcome measure    Time 7    Period Weeks    Status On-going            Plan - 03/11/21 1435    Clinical Impression Statement [name] Furrow is referred to outpatient ST due to cognitive communication impairments s/p CVA 12/2020. SLP reviewed current cognitive difficulties, with pt reporting some independent implementation of modifications and compensations to reduce cognitive burden on self (ex: alarms, GPS, written aids). SLP provided additional recommendations for external memory aids to improve recall and techniques to improve comprehension and confirmation of accurate information. I recommend  skilled ST to maximize cognition for indepencence, safety and to return to PLOF for household management and community participation.  Speech Therapy Frequency 2x / week    Duration 8 weeks   17 visits   Treatment/Interventions Environmental controls;Cueing hierarchy;SLP instruction and feedback;Compensatory strategies;Functional tasks;Cognitive reorganization;Compensatory techniques;Internal/external aids;Multimodal communcation approach;Patient/family education;Language facilitation    Potential to Achieve Goals Good           Patient will benefit from skilled therapeutic intervention in order to improve the following deficits and impairments:   Cognitive communication deficit    Problem List Patient Active Problem List   Diagnosis Date Noted  . Bacteremia   . Septic embolism ([place]) 02/10/2021  . Subacute endocarditis 02/10/2021  . Malnutrition of moderate degree 02/09/2021  . Acute ischemic stroke ([place]) 02/08/2021  . Infarction of right thalamus ([place]) 02/07/2021  . Essential hypertension 02/07/2021  . Paresthesia 02/02/2021  . Gait abnormality 02/02/2021  . Confusion 05/08/2018  . Stroke ([place]) 03/17/2017  . Type 2 diabetes mellitus with hyperlipidemia ([place]) 03/17/2017  . Confusion state 02/16/2017  . Angioedema 02/16/2017  . Panic 02/04/2017  . Adjustment disorder with anxious mood 02/04/2017  . Sinusitis, chronic 12/28/2011  . Rhinitis, allergic 12/28/2011  . Extrinsic asthma, unspecified 12/07/2011    Kourtney Montesinos, Annye Rusk MS, CCC-SLP 03/11/2021, 2:39 PM  [facility] [address] [place], [name], [address] Phone: [phone]   Fax:  [phone]   Name: Ameerah Huffstetler MRN: [id number] Date of Birth: February 16, 1961

## 2021-03-11 NOTE — Patient Instructions (Signed)
Access Code: [email or web address] URL: [email or web address] Date: 03/11/2021 Prepared by: Cherly Anderson  Exercises Walking March - 2 x daily - 7 x weekly - 1 sets - 3-4 reps Tandem Walking with Counter Support - 2 x daily - 7 x weekly - 1 sets - 3-4 reps Standing Balance with Eyes Closed on Foam - 2 x daily - 7 x weekly - 1 sets - 3 reps - 20 sec hold Standing head turns - 2 x daily - 7 x weekly - 2 sets - 10 reps Step Taps on Low Step - 2 x daily - 7 x weekly - 1 sets - 10 reps Prone Press Up On Elbows - 3-4 x daily - 7 x weekly - 1 sets - 10 reps Supine Bridge - 1 x daily - 5 x weekly - 1 sets - 10 reps Hooklying Small March - 1 x daily - 5 x weekly - 1 sets - 10 reps Bent Knee Fallouts - 1 x daily - 5 x weekly - 1 sets - 10 reps

## 2021-03-11 NOTE — Therapy (Signed)
[facility] [address] [place], [name], [address] Phone: [phone]   Fax:  [phone]  Physical Therapy Treatment  Patient Details  Name: Sheila Guerrero MRN: [id number] Date of Birth: 1961-11-12 Referring Provider (PT): Barb Merino (not reffered by, but previous neurologist has been Dr. Krista Blue)   Encounter Date: 03/11/2021   PT End of Session - 03/11/21 1020    Visit Number 3    Number of Visits 9    Authorization Type [employer] - $40 co pay per discipline    PT Start Time 1018    PT Stop Time 1100    PT Time Calculation (min) 42 min    Equipment Utilized During Treatment Gait belt    Activity Tolerance Patient tolerated treatment well    Behavior During Therapy [facility] for tasks assessed/performed           Past Medical History:  Diagnosis Date  . Angioedema   . Anxiety   . Asthma   . Depression   . Diabetes mellitus, type II ([place])   . Dyslipidemia   . Family history of adverse reaction to anesthesia    mother severely confused after general anesthesia  . History of CVA (cerebrovascular accident)   . HTN (hypertension) 2013  . Panic attacks   . Pneumonia   . Septic embolism ([place]) 02/10/2021  . Situational stress   . Subacute endocarditis 02/10/2021    Past Surgical History:  Procedure Laterality Date  . BUBBLE STUDY  02/10/2021   Procedure: BUBBLE STUDY;  Surgeon: Dixie Dials, MD;  Location: [facility];  Service: Cardiovascular;;  . CHOLECYSTECTOMY  2010  . TEE WITHOUT CARDIOVERSION N/A 02/10/2021   Procedure: TRANSESOPHAGEAL ECHOCARDIOGRAM (TEE);  Surgeon: Dixie Dials, MD;  Location: [facility] ENDOSCOPY;  Service: Cardiovascular;  Laterality: N/A;    There were no vitals filed for this visit.   Subjective Assessment - 03/11/21 1020    Subjective Pt reports that she has been having a lot of pain in right low back and down leg since last session. She reports that the marching seemed to really inflame  her lumbar radiculopathy.    Pertinent History history of hypertension, hyperlipidemia, type 2 diabetes on Metformin at home, anxiety and depression, history of a stroke in 2017, Angioedema    Limitations Walking    Diagnostic tests MRI showed small acute to early subacute infarct in the right  thalamus/posterior limb of internal capsule and small subacute left occipital lobe infarct.    Patient Stated Goals wants to know what she can do and shouldn't do    Currently in Pain? Yes    Pain Score 7     Pain Location Back    Pain Orientation Right    Pain Descriptors / Indicators Burning   "like bone on bone"   Pain Type Acute pain    Pain Onset More than a month ago    Pain Frequency Constant    Aggravating Factors  marching    Pain Relieving Factors chiropractor but he is out of town so she can't get in to see him    Pain Score 7    Pain Location Genitalia    Pain Orientation Left    Pain Descriptors / Indicators Numbness;Tingling    Pain Type Acute pain    Pain Onset More than a month ago    Pain Frequency Constant    Aggravating Factors  pressure    Pain Relieving Factors lyrica  [facility] PT Assessment - 03/11/21 1026      Palpation   Palpation comment No change in pain with PA mobs to lumbar spine in prone. No change with repeated extension or repeated flexion.      Special Tests    Special Tests Lumbar    Lumbar Tests Straight Leg Raise      Straight Leg Raise   Findings Positive    Side  Right                         [facility] Adult PT Treatment/Exercise - 03/11/21 1026      Ambulation/Gait   Ambulation/Gait Yes    Ambulation/Gait Assistance 5: Supervision    Ambulation/Gait Assistance Details Around in session between [place]. Pt unsteady at times catching feet on floor occasionally. Was able to take a quick step to catch herself when she did. PT advised to use cane right now.    Assistive device Straight cane    Gait Pattern Step-through  pattern;Decreased step length - left;Decreased step length - right    Ambulation Surface Level;Indoor      Neuro Re-ed    Neuro Re-ed Details  Along counter: tandem gait with fingertip support 8' x 6, side stepping 8' x 6. Standing on rockerboard positioned ant/post: trying to maintain level x 30 sec then rocking board back and forth x 10 CGA. HR=92      Exercises   Exercises Other Exercises    Other Exercises  Hooklying bilateral knee fall out x 10 with cues for transverse abdominus (TA) contraction, marching x 10, bridges x 10. Pt denies any pain reporting it stayed the same. Verbal cues for proper technqiue throughout and to breath.                  PT Education - 03/11/21 1241    Education Details Pt instructed to hold off on marching walk right now due to increased sciatic pain. Added core stab and repeated extension to HEP.    Person(s) Educated Patient    Methods Explanation;Demonstration    Comprehension Verbalized understanding;Returned demonstration            PT Short Term Goals - 02/25/21 1254      PT SHORT TERM GOAL #1   Title Pt will improve FGA score to at least a 20/30 in order to demo decr fall risk. ALL STGS DUE 03/25/21    Baseline 17/30    Time 4    Period Weeks    Status New    Target Date 03/25/21      PT SHORT TERM GOAL #2   Title Pt will improve gait speed with no AD to at least 2.9 ft/sec in order to demo improved community mobility.    Baseline 2.66 ft/sec with no AD    Time 4    Period Weeks    Status New      PT SHORT TERM GOAL #3   Title Pt will improve mCTSIB condition 4 to at least 15 seconds in order to demo improved vestibular input for balance.    Baseline 4 seconds    Time 4    Period Weeks    Status New             PT Long Term Goals - 02/25/21 1256      PT LONG TERM GOAL #1   Title Pt will be independent with final HEP in order to build upon functional  gains made in therapy. ALL LTGS DUE 04/22/21    Time 8    Period  Weeks    Status New    Target Date 04/22/21      PT LONG TERM GOAL #2   Title Pt will improve FGA score to at least a 23/30 in order to demo decr fall risk.    Baseline 17/30    Time 8    Period Weeks    Status New      PT LONG TERM GOAL #3   Title Pt will improve gait speed with no AD to at least 3.2 ft/sec in order to demo improved community mobility.    Baseline 2.66 ft/sec with no AD    Time 8    Period Weeks    Status New      PT LONG TERM GOAL #4   Title Pt will improve FOTO score to at least a 65 in order to demo improved functional outcomes.    Baseline 53    Time 8    Period Weeks    Status New      PT LONG TERM GOAL #5   Title Pt will ambulate at least 500' over outdoor paved/grass surfaces with supervision and no AD in order to demo improved community mobility.    Time 8    Period Weeks    Status New                 Plan - 03/11/21 1250    Clinical Impression Statement Pt was having more pain in right low back with radicular symptoms. Pt was positive on right SLR. No decrease in pain with repeated flexion and extension but with pt reporting increased pain after marching last session did add repeated extension to HEP as well as some core stab. Pt follows with chiropractor for her back pain (who is currently out of town) so will leave further manual work to them. Pt had no increased pain with activities during session today. Was less steady so advised to use cane at this time.    Personal Factors and Comorbidities Comorbidity 3+;Past/Current Experience;Time since onset of injury/illness/exacerbation   pt with $40 co pay per discipline   Comorbidities history of hypertension, hyperlipidemia, type 2 diabetes, anxiety and depression, history of a stroke in 2017, Angioedema    Examination-Activity Limitations Stairs;Transfers;Locomotion Level    Examination-Participation Restrictions Community Activity;Driving    Stability/Clinical Decision Making Evolving/Moderate  complexity    Rehab Potential Good    PT Frequency 1x / week    PT Duration 8 weeks    PT Treatment/Interventions ADLs/Self Care Home Management;Gait training;Stair training;Functional mobility training;Therapeutic activities;Therapeutic exercise;Neuromuscular re-education;Balance training;DME Instruction;Patient/family education;Vestibular;Visual/perceptual remediation/compensation    PT Next Visit Plan * pt has PICC line in right arm*. How is back doing? Did prone on elbows help at all? Continue functional strengthening/balance (narrow BOS, SLS, head motions, eyes closed). high level balance. Incorporate core stab to help support back as well.           Patient will benefit from skilled therapeutic intervention in order to improve the following deficits and impairments:  Abnormal gait,Decreased balance,Difficulty walking,Dizziness,Decreased strength,Impaired sensation,Postural dysfunction,Pain  Visit Diagnosis: Other abnormalities of gait and mobility  Muscle weakness (generalized)  Unsteadiness on feet     Problem List Patient Active Problem List   Diagnosis Date Noted  . Bacteremia   . Septic embolism ([place]) 02/10/2021  . Subacute endocarditis 02/10/2021  . Malnutrition of moderate degree 02/09/2021  .  Acute ischemic stroke ([place]) 02/08/2021  . Infarction of right thalamus ([place]) 02/07/2021  . Essential hypertension 02/07/2021  . Paresthesia 02/02/2021  . Gait abnormality 02/02/2021  . Confusion 05/08/2018  . Stroke ([place]) 03/17/2017  . Type 2 diabetes mellitus with hyperlipidemia ([place]) 03/17/2017  . Confusion state 02/16/2017  . Angioedema 02/16/2017  . Panic 02/04/2017  . Adjustment disorder with anxious mood 02/04/2017  . Sinusitis, chronic 12/28/2011  . Rhinitis, allergic 12/28/2011  . Extrinsic asthma, unspecified 12/07/2011    Electa Sniff, PT, DPT, NCS 03/11/2021, 12:54 PM  [facility] [address]  [place], [name], [address] Phone: [phone]   Fax:  [phone]  Name: Sheila Guerrero MRN: [id number] Date of Birth: [birth year]

## 2021-03-11 NOTE — Patient Instructions (Signed)
1. Grip Strengthening (Resistive Putty)   Squeeze putty using thumb and all fingers. Repeat _20___ times. Do __2__ sessions per day.   2. Roll putty into tube on table and pinch between each finger and thumb x 10 reps each. (can do ring and small finger together)     Copyright  VHI. All rights reserved.   

## 2021-03-12 ENCOUNTER — Encounter (HOSPITAL_COMMUNITY): Payer: Self-pay | Admitting: Emergency Medicine

## 2021-03-12 ENCOUNTER — Other Ambulatory Visit: Payer: Self-pay

## 2021-03-12 ENCOUNTER — Observation Stay (HOSPITAL_COMMUNITY)
Admission: EM | Admit: 2021-03-12 | Discharge: 2021-03-13 | Disposition: A | Discharge status: Home / Self Care | Payer: 59 | Attending: Family Medicine | Admitting: Family Medicine

## 2021-03-12 ENCOUNTER — Observation Stay (HOSPITAL_COMMUNITY): Payer: 59

## 2021-03-12 ENCOUNTER — Emergency Department (HOSPITAL_COMMUNITY): Payer: 59

## 2021-03-12 DIAGNOSIS — R479 Unspecified speech disturbances: Principal | ICD-10-CM | POA: Insufficient documentation

## 2021-03-12 DIAGNOSIS — Z20822 Contact with and (suspected) exposure to covid-19: Secondary | ICD-10-CM | POA: Diagnosis not present

## 2021-03-12 DIAGNOSIS — Z794 Long term (current) use of insulin: Secondary | ICD-10-CM | POA: Insufficient documentation

## 2021-03-12 DIAGNOSIS — Z79899 Other long term (current) drug therapy: Secondary | ICD-10-CM | POA: Insufficient documentation

## 2021-03-12 DIAGNOSIS — I6330 Cerebral infarction due to thrombosis of unspecified cerebral artery: Secondary | ICD-10-CM | POA: Insufficient documentation

## 2021-03-12 DIAGNOSIS — Z7902 Long term (current) use of antithrombotics/antiplatelets: Secondary | ICD-10-CM | POA: Diagnosis not present

## 2021-03-12 DIAGNOSIS — E1169 Type 2 diabetes mellitus with other specified complication: Secondary | ICD-10-CM | POA: Diagnosis present

## 2021-03-12 DIAGNOSIS — G9349 Other encephalopathy: Secondary | ICD-10-CM | POA: Diagnosis not present

## 2021-03-12 DIAGNOSIS — G9340 Encephalopathy, unspecified: Secondary | ICD-10-CM | POA: Diagnosis present

## 2021-03-12 DIAGNOSIS — I339 Acute and subacute endocarditis, unspecified: Secondary | ICD-10-CM | POA: Insufficient documentation

## 2021-03-12 DIAGNOSIS — E119 Type 2 diabetes mellitus without complications: Secondary | ICD-10-CM | POA: Insufficient documentation

## 2021-03-12 DIAGNOSIS — I10 Essential (primary) hypertension: Secondary | ICD-10-CM | POA: Diagnosis present

## 2021-03-12 DIAGNOSIS — E785 Hyperlipidemia, unspecified: Secondary | ICD-10-CM | POA: Insufficient documentation

## 2021-03-12 DIAGNOSIS — Z7982 Long term (current) use of aspirin: Secondary | ICD-10-CM | POA: Diagnosis not present

## 2021-03-12 DIAGNOSIS — J45909 Unspecified asthma, uncomplicated: Secondary | ICD-10-CM | POA: Diagnosis present

## 2021-03-12 DIAGNOSIS — R404 Transient alteration of awareness: Secondary | ICD-10-CM

## 2021-03-12 DIAGNOSIS — Z91040 Latex allergy status: Secondary | ICD-10-CM | POA: Diagnosis not present

## 2021-03-12 LAB — URINALYSIS, ROUTINE W REFLEX MICROSCOPIC
Bilirubin Urine: NEGATIVE
Glucose, UA: >=500 mg/dL — AB
Hgb urine dipstick: NEGATIVE
Ketones, ur: NEGATIVE mg/dL
Nitrite: NEGATIVE
Protein, ur: 30 mg/dL — AB
Specific Gravity, Urine: 1.021 (ref 1.005–1.030)
pH: 5 (ref 5.0–8.0)

## 2021-03-12 LAB — RAPID URINE DRUG SCREEN, HOSP PERFORMED
Amphetamines: NOT DETECTED
Barbiturates: NOT DETECTED
Benzodiazepines: NOT DETECTED
Cocaine: NOT DETECTED
Opiates: NOT DETECTED
Tetrahydrocannabinol: NOT DETECTED

## 2021-03-12 LAB — CBC WITH DIFFERENTIAL/PLATELET
Abs Immature Granulocytes: 0.02 10*3/uL (ref 0.00–0.07)
Basophils Absolute: 0.1 10*3/uL (ref 0.0–0.1)
Basophils Relative: 1 %
Eosinophils Absolute: 0.1 10*3/uL (ref 0.0–0.5)
Eosinophils Relative: 1 %
HCT: 48 % — ABNORMAL HIGH (ref 36.0–46.0)
Hemoglobin: 15.7 g/dL — ABNORMAL HIGH (ref 12.0–15.0)
Immature Granulocytes: 0 %
Lymphocytes Relative: 36 %
Lymphs Abs: 2.9 10*3/uL (ref 0.7–4.0)
MCH: 29 pg (ref 26.0–34.0)
MCHC: 32.7 g/dL (ref 30.0–36.0)
MCV: 88.6 fL (ref 80.0–100.0)
Monocytes Absolute: 0.6 10*3/uL (ref 0.1–1.0)
Monocytes Relative: 7 %
Neutro Abs: 4.5 10*3/uL (ref 1.7–7.7)
Neutrophils Relative %: 55 %
Platelets: 293 10*3/uL (ref 150–400)
RBC: 5.42 MIL/uL — ABNORMAL HIGH (ref 3.87–5.11)
RDW: 12.5 % (ref 11.5–15.5)
WBC: 8.1 10*3/uL (ref 4.0–10.5)
nRBC: 0 % (ref 0.0–0.2)

## 2021-03-12 LAB — COMPREHENSIVE METABOLIC PANEL
ALT: 30 U/L (ref 0–44)
AST: 18 U/L (ref 15–41)
Albumin: 3.8 g/dL (ref 3.5–5.0)
Alkaline Phosphatase: 60 U/L (ref 38–126)
Anion gap: 8 (ref 5–15)
BUN: 17 mg/dL (ref 6–20)
CO2: 26 mmol/L (ref 22–32)
Calcium: 9.2 mg/dL (ref 8.9–10.3)
Chloride: 104 mmol/L (ref 98–111)
Creatinine, Ser: 0.78 mg/dL (ref 0.44–1.00)
GFR, Estimated: >60 mL/min (ref >60)
Glucose, Bld: 207 mg/dL — ABNORMAL HIGH (ref 70–99)
Potassium: 3.8 mmol/L (ref 3.5–5.1)
Sodium: 138 mmol/L (ref 135–145)
Total Bilirubin: 0.7 mg/dL (ref 0.3–1.2)
Total Protein: 6.4 g/dL — ABNORMAL LOW (ref 6.5–8.1)

## 2021-03-12 LAB — PROTIME-INR
INR: 1 (ref 0.8–1.2)
Prothrombin Time: 12.9 seconds (ref 11.4–15.2)

## 2021-03-12 LAB — I-STAT BETA HCG BLOOD, ED (MC, WL, AP ONLY): I-stat hCG, quantitative: 5 m[IU]/mL — ABNORMAL HIGH (ref <5)

## 2021-03-12 LAB — APTT: aPTT: 25 seconds (ref 24–36)

## 2021-03-12 LAB — ETHANOL: Alcohol, Ethyl (B): <10 mg/dL (ref <10)

## 2021-03-12 MED ORDER — CARVEDILOL 12.5 MG PO TABS: 6.2500 mg | ORAL_TABLET | Freq: Two times a day (BID) | ORAL | Status: DC

## 2021-03-12 MED ORDER — PREGABALIN 25 MG PO CAPS
150.0000 mg | ORAL_CAPSULE | Freq: Two times a day (BID) | ORAL | Status: DC
  Administered 2021-03-12: 150 mg via ORAL
  Filled 2021-03-12: qty 2
  Filled 2021-03-12: qty 1
  Filled 2021-03-12: qty 2

## 2021-03-12 MED ORDER — PREGABALIN 25 MG PO CAPS: 150.0000 mg | ORAL_CAPSULE | Freq: Two times a day (BID) | ORAL | Status: DC

## 2021-03-12 MED ORDER — INSULIN ASPART 100 UNIT/ML ~~LOC~~ SOLN
0.0000 [IU] | Freq: Three times a day (TID) | SUBCUTANEOUS | Status: DC
  Administered 2021-03-13: 3 [IU] via SUBCUTANEOUS
  Administered 2021-03-13: 5 [IU] via SUBCUTANEOUS

## 2021-03-12 MED ORDER — ASPIRIN EC 81 MG PO TBEC: 81.0000 mg | DELAYED_RELEASE_TABLET | Freq: Every day | ORAL | Status: DC

## 2021-03-12 MED ORDER — INSULIN GLARGINE 100 UNIT/ML ~~LOC~~ SOLN
20.0000 [IU] | Freq: Every day | SUBCUTANEOUS | Status: DC
  Administered 2021-03-13: 20 [IU] via SUBCUTANEOUS
  Filled 2021-03-12 (×2): qty 0.2

## 2021-03-12 MED ORDER — SODIUM CHLORIDE 0.9 % IV SOLN
800.0000 mg | Freq: Every day | INTRAVENOUS | Status: DC
  Administered 2021-03-13: 800 mg via INTRAVENOUS
  Filled 2021-03-12 (×2): qty 16

## 2021-03-12 MED ORDER — STROKE: EARLY STAGES OF RECOVERY BOOK: Freq: Once | Status: DC

## 2021-03-12 MED ORDER — CLOPIDOGREL BISULFATE 75 MG PO TABS
75.0000 mg | ORAL_TABLET | Freq: Every day | ORAL | Status: DC
  Administered 2021-03-13: 75 mg via ORAL
  Filled 2021-03-12: qty 1

## 2021-03-12 MED ORDER — SODIUM CHLORIDE 0.9 % IV SOLN: INTRAVENOUS | Status: DC

## 2021-03-12 MED ORDER — ACETAMINOPHEN 325 MG PO TABS: 650.0000 mg | ORAL_TABLET | ORAL | Status: DC | PRN

## 2021-03-12 MED ORDER — ACETAMINOPHEN 160 MG/5ML PO SOLN: 650.0000 mg | ORAL | Status: DC | PRN

## 2021-03-12 MED ORDER — SODIUM CHLORIDE 0.9 % IV SOLN
2.0000 g | Freq: Every day | INTRAVENOUS | Status: DC
  Administered 2021-03-13: 2 g via INTRAVENOUS
  Filled 2021-03-12: qty 20

## 2021-03-12 MED ORDER — ACETAMINOPHEN 650 MG RE SUPP: 650.0000 mg | RECTAL | Status: DC | PRN

## 2021-03-12 MED ORDER — ENOXAPARIN SODIUM 40 MG/0.4ML ~~LOC~~ SOLN
40.0000 mg | SUBCUTANEOUS | Status: DC
  Administered 2021-03-13: 40 mg via SUBCUTANEOUS
  Filled 2021-03-12: qty 0.4

## 2021-03-12 NOTE — Progress Notes (Signed)
EEG complete - results pending 

## 2021-03-12 NOTE — ED Notes (Signed)
EEG at bedside.

## 2021-03-12 NOTE — ED Notes (Signed)
MRI called at this time with questions

## 2021-03-12 NOTE — ED Notes (Signed)
When asking pt a question she will answer with something that does not involve the question

## 2021-03-12 NOTE — H&P (Signed)
History and Physical    Sheila Guerrero ETX:[id number] DOB: 04/01/1961 DOA: 03/12/2021  PCP: Tally Joe, MD  Patient coming from: Home.  Chief Complaint: Difficulty speaking confusion and difficulty walking.  HPI: Sheila Guerrero is a 60 y.o. female with history of recent admission for stroke discharged on February 12, 2021 at that time patient also was diagnosed with culture-negative endocarditis involving the mitral valve and tricuspid valve presently on daptomycin and ceftriaxone was found to be confused with difficulty talking by patient's husband at around 6 PM today.  Patient has been came back from work when patient was found to be confused with difficulty speaking and difficulty walking.  Patient also had nausea and vomiting.  Did not complain of any abdominal pain.  Did not have any fever or chills.  Last seen normal was the prior night.  ED Course: In the ER CT head was unremarkable.  On-call neurologist was consulted.  Patient admitted for further work-up for possible seizures versus new stroke.  Patient passed a stroke swallow.  Covid test is pending.  EKG shows normal sinus rhythm.  On my exam patient is appearing confused but oriented to her name.  Moving all extremities.  Pupils are reactive to light.  No obvious facial asymmetry.  Review of Systems: As per HPI, rest all negative.   Past Medical History:  Diagnosis Date  . Angioedema   . Anxiety   . Asthma   . Depression   . Diabetes mellitus, type II (HCC)   . Dyslipidemia   . Family history of adverse reaction to anesthesia    mother severely confused after general anesthesia  . History of CVA (cerebrovascular accident)   . HTN (hypertension) 2013  . Panic attacks   . Pneumonia   . Septic embolism (HCC) 02/10/2021  . Situational stress   . Subacute endocarditis 02/10/2021    Past Surgical History:  Procedure Laterality Date  . BUBBLE STUDY  02/10/2021   Procedure: BUBBLE STUDY;  Surgeon: Orpah Cobb, MD;   Location: [facility] ENDOSCOPY;  Service: Cardiovascular;;  . CHOLECYSTECTOMY  2010  . TEE WITHOUT CARDIOVERSION N/A 02/10/2021   Procedure: TRANSESOPHAGEAL ECHOCARDIOGRAM (TEE);  Surgeon: Orpah Cobb, MD;  Location: [facility] ENDOSCOPY;  Service: Cardiovascular;  Laterality: N/A;     reports that she has never smoked. She has never used smokeless tobacco. She reports current alcohol use. She reports that she does not use drugs.  Allergies  Allergen Reactions  . Sulfa Antibiotics Swelling  . Dulaglutide     Other reaction(s): rash  . Empagliflozin     Other reaction(s): recurrent yeast  . Iodinated Diagnostic Agents     Other reaction(s): angioedema with CT dye  . Latex Rash    Family History  Problem Relation Age of Onset  . Hypertension Mother   . Other Mother        blood disorder  . Hypertension Father   . Stroke Father   . Diabetes Maternal Aunt     Prior to Admission medications   Medication Sig Start Date End Date Taking? Authorizing Provider  acetaminophen (TYLENOL) 500 MG tablet Take 1,000 mg by mouth every 6 (six) hours as needed for moderate pain.   Yes [provider]  aspirin EC 81 MG EC tablet Take 1 tablet (81 mg total) by mouth daily. Swallow whole. 02/13/21  Yes Dorcas Carrow, MD  carvedilol (COREG) 6.25 MG tablet Take 6.25 mg by mouth 2 (two) times daily with a meal.   Yes [provider]  cefTRIAXone (ROCEPHIN) IVPB Inject 2 g into the vein every 12 (twelve) hours. Indication: Endocarditis First Dose: No Last Day of Therapy:  03/18/2021 Labs - Once weekly:  CBC/D and BMP, Labs - Every other week:  ESR and CRP Method of administration: IV Push Method of administration may be changed at the discretion of home infusion pharmacist based upon assessment of the patient and/or caregiver's ability to self-administer the medication ordered. 02/12/21 03/18/21 Yes Ghimire, Dante Gang, MD  clopidogrel (PLAVIX) 75 MG tablet Take 75 mg by mouth daily. 03/12/21  Yes [provider]  daptomycin (CUBICIN) IVPB Inject 800 mg into the vein daily. Indication:  Endocarditis First Dose: No Last Day of Therapy:  03/18/2021 Labs - Once weekly:  CBC/D, BMP, and CPK Labs - Every other week:  ESR and CRP Method of administration: IV Push Method of administration may be changed at the discretion of home infusion pharmacist based upon assessment of the patient and/or caregiver's ability to self-administer the medication ordered. 02/12/21 03/18/21 Yes Ghimire, Dante Gang, MD  EPINEPHrine 0.3 mg/0.3 mL IJ SOAJ injection Inject 0.3 mg into the muscle daily as needed for anaphylaxis.   Yes [provider]  LANTUS SOLOSTAR 100 UNIT/ML Solostar Pen Inject 20 Units into the skin at bedtime. 02/23/21  Yes [provider]  metFORMIN (GLUCOPHAGE-XR) 500 MG 24 hr tablet Take 2,000 mg by mouth daily. 01/29/21  Yes [provider]  pregabalin (LYRICA) 75 MG capsule Take 150 mg by mouth 2 (two) times daily. 01/21/21  Yes [provider]  sacubitril-valsartan (ENTRESTO) 24-26 MG Take 1 tablet by mouth 2 (two) times daily. 02/12/21 05/13/21 Yes Ghimire, Dante Gang, MD  traMADol (ULTRAM) 50 MG tablet Take 50 mg by mouth 2 (two) times daily as needed for moderate pain. 02/27/21  Yes [provider]  carvedilol (COREG) 3.125 MG tablet Take 1 tablet (3.125 mg total) by mouth 2 (two) times daily with a meal. Patient not taking: No sig reported 02/12/21 05/13/21  Barb Merino, MD  insulin detemir (LEVEMIR) 100 UNIT/ML FlexPen Inject 20 Units into the skin at bedtime. Patient not taking: No sig reported 02/12/21   Barb Merino, MD  Insulin Pen Needle (PEN NEEDLES) 29G X 12MM MISC 1 each by Does not apply route daily. 02/12/21   Barb Merino, MD  lisinopril (ZESTRIL) 20 MG tablet Take 1 tablet by mouth daily. Patient not taking: No sig reported 02/17/21   [provider]    Physical Exam: Constitutional: Moderately built and nourished. Vitals:   03/12/21  2045 03/12/21 2100 03/12/21 2145 03/12/21 2230  BP: (!) 149/97 (!) 169/101 (!) 159/92 (!) 156/98  Pulse: 97 (!) 101 100 (!) 104  Resp: (!) 21 18    Temp:      TempSrc:      SpO2: 100% 100% 98% 98%  Weight:      Height:       Eyes: Anicteric no pallor. ENMT: No discharge from the ears eyes nose or mouth. Neck: No mass felt.  No neck rigidity. Respiratory: No rhonchi or crepitations. Cardiovascular: S1-S2 heard. Abdomen: Soft nontender bowel sounds present. Musculoskeletal: No edema. Skin: No rash. Neurologic: Alert awake but only oriented to name appears confused moving all extremities difficult to assess exact strength.  Pupils reactive to light.  No facial asymmetry. Psychiatric: Appears confused.   Labs on Admission: I have personally reviewed following labs and imaging studies  CBC: Recent Labs  Lab 03/12/21 1842  WBC 8.1  NEUTROABS 4.5  HGB 15.7*  HCT 48.0*  MCV 88.6  PLT 315   Basic Metabolic Panel: Recent Labs  Lab 03/12/21 1842  NA 138  K 3.8  CL 104  CO2 26  GLUCOSE 207*  BUN 17  CREATININE 0.78  CALCIUM 9.2   GFR: Estimated Creatinine Clearance: 80.5 mL/min (by C-G formula based on SCr of 0.78 mg/dL). Liver Function Tests: Recent Labs  Lab 03/12/21 1842  AST 18  ALT 30  ALKPHOS 60  BILITOT 0.7  PROT 6.4*  ALBUMIN 3.8   No results for input(s): LIPASE, AMYLASE in the last 168 hours. No results for input(s): AMMONIA in the last 168 hours. Coagulation Profile: Recent Labs  Lab 03/12/21 1842  INR 1.0   Cardiac Enzymes: No results for input(s): CKTOTAL, CKMB, CKMBINDEX, TROPONINI in the last 168 hours. BNP (last 3 results) No results for input(s): PROBNP in the last 8760 hours. HbA1C: No results for input(s): HGBA1C in the last 72 hours. CBG: No results for input(s): GLUCAP in the last 168 hours. Lipid Profile: No results for input(s): CHOL, HDL, LDLCALC, TRIG, CHOLHDL, LDLDIRECT in the last 72 hours. Thyroid Function Tests: No  results for input(s): TSH, T4TOTAL, FREET4, T3FREE, THYROIDAB in the last 72 hours. Anemia Panel: No results for input(s): VITAMINB12, FOLATE, FERRITIN, TIBC, IRON, RETICCTPCT in the last 72 hours. Urine analysis:    Component Value Date/Time   COLORURINE YELLOW 03/12/2021 2120   APPEARANCEUR HAZY (A) 03/12/2021 2120   LABSPEC 1.021 03/12/2021 2120   PHURINE 5.0 03/12/2021 2120   GLUCOSEU >=500 (A) 03/12/2021 2120   HGBUR NEGATIVE 03/12/2021 2120   BILIRUBINUR NEGATIVE 03/12/2021 2120   KETONESUR NEGATIVE 03/12/2021 2120   PROTEINUR 30 (A) 03/12/2021 2120   UROBILINOGEN 0.2 12/04/2011 2352   NITRITE NEGATIVE 03/12/2021 2120   LEUKOCYTESUR SMALL (A) 03/12/2021 2120   Sepsis Labs: $[id number]$ (procalcitonin:4,lacticidven:4) )No results found for this or any previous visit (from the past 240 hour(s)).   Radiological Exams on Admission: CT HEAD WO CONTRAST  Result Date: 03/12/2021 CLINICAL DATA:  Aphasia. EXAM: CT HEAD WITHOUT CONTRAST TECHNIQUE: Contiguous axial images were obtained from the base of the skull through the vertex without intravenous contrast. COMPARISON:  MR head dated February 09, 2021 FINDINGS: Brain: There is mild cerebral atrophy with widening of the extra-axial spaces and ventricular dilatation. There are areas of decreased attenuation within the white matter tracts of the supratentorial brain, consistent with microvascular disease changes. Chronic bilateral basal ganglia lacunar infarcts are noted. Vascular: No hyperdense vessel or unexpected calcification. Skull: Normal. Negative for fracture or focal lesion. Sinuses/Orbits: No acute finding. Other: None. IMPRESSION: 1. Generalized cerebral atrophy. 2. Chronic bilateral basal ganglia lacunar infarcts. 3. No acute intracranial abnormality. Electronically Signed   By: Virgina Norfolk M.D.   On: 03/12/2021 19:56    EKG: Independently reviewed.  Normal sinus rhythm.  Assessment/Plan Principal Problem:   Acute  encephalopathy Active Problems:   Extrinsic asthma   Type 2 diabetes mellitus with hyperlipidemia (HCC)   Essential hypertension   Subacute endocarditis    1. Difficulty speaking with ataxia and confusion concerning for stroke versus seizure for which neurologist on-call has been consulted.  EEG and MRI brain MRA brain has been ordered.  Based on which will a further plan.  Since patient has had recent endocarditis will order repeat blood cultures.  Patient passed swallow.  Neurochecks.  Physical therapy consult.  On Plavix.  Holding of aspirin since patient has history of angioedema per the report.  Statins. 2.  Culture negative mitral valve and tricuspid valve endocarditis on daptomycin and ceftriaxone.  Patient has 1 more week of antibiotics to complete.  Will repeat blood cultures. 3. Cardiomyopathy last EF measured was 45 to 50%.  Presently holding of Entresto and carvedilol to allow for permissive hypertension as requested by cardiologist. 4. Diabetes mellitus type 2 on Lantus 20 units.  Last hemoglobin A1c was 12.22-month ago. 5. Pain related to recent thalamic stroke on Lyrica and if there is no seizures tramadol will be restarted.  Covid test is pending.   DVT prophylaxis: Lovenox. Code Status: Full code. Family Communication: Patient's husband. Disposition Plan: Home. Consults called: Neurology. Admission status: Observation.   Rise Patience MD [facility] Pager [phone].  If 7PM-7AM, please contact night-coverage [email or web address] Password [facility]  03/12/2021, 11:49 PM

## 2021-03-12 NOTE — ED Notes (Signed)
Labs drawn at this time. 

## 2021-03-12 NOTE — ED Triage Notes (Signed)
Emergency Medicine Provider Triage Evaluation Note  Sheila Guerrero , a 60 y.o. female  was evaluated in triage.  Pt complains of aphasia, nausea, vomiting.  Review of Systems  Positive: Aphasia, confusion, nausea, vomiting Negative: Word slurring, facial droop, weakness, numbness  Physical Exam  BP 126/81 (BP Location: Right Arm)   Pulse (!) 101   Temp 98.2 F (36.8 C) (Oral)   Resp 16   LMP 11/25/2011   SpO2 99%  Gen:   Awake, no distress   HEENT:  Atraumatic  Resp:  Normal effort  Cardiac:  Normal rate  Abd:   Nondistended, nontender  MSK:   Moves extremities without difficulty  Neuro:  Mental Status:  Alert, thought content appropriate, able to give a coherent history. Speech fluent without clear evidence of aphasia.  Able to follow 2 step commands without difficulty.  Cranial Nerves:  II:  Peripheral visual fields grossly normal, pupils equal, round, reactive to light III,IV, VI: ptosis not present, extra-ocular motions intact bilaterally  V,VII: smile symmetric, facial light touch sensation equal VIII: hearing grossly normal to voice  X: uvula elevates symmetrically  XI: bilateral shoulder shrug symmetric and strong XII: midline tongue extension without fassiculations Motor:  Normal tone. 5/5 strength of BUE and BLE major muscle groups including strong and equal grip strength and dorsiflexion/plantar flexion Sensory: light touch normal in all extremities. Cerebellar: normal finger-to-nose with bilateral upper extremities, Romberg sign absent Gait: Not assessed   Medical Decision Making  Medically screening exam initiated at 6:43 PM.  Appropriate orders placed.  [name] was informed that the remainder of the evaluation will be completed by another provider, this initial triage assessment does not replace that evaluation, and the importance of remaining in the ED until their evaluation is complete.  Clinical Impression  Presents with aphasia, nausea,  vomiting, confusion, unsteady gait over the last 24 hours.  Prior history of stroke.  Last known well last night around 8 PM.  She just finished Plavix and is on aspirin.  No clear neurologic deficits on my exam.  No LVO signs.  Discussed with Dr. Tomi Bamberger, patient does not qualify for code stroke.  Will initiate stroke order set, expedite triage.  Overall patient is stable at this time   Garald Balding, PA-C 03/12/21 1845

## 2021-03-12 NOTE — ED Triage Notes (Signed)
Pt BIB her husband, who reports LSN 10pm last night when they went to bed. States pt c/o nausea/vomiting earlier in the day, when he got home husband noticed pt was having difficulty with her speech and was more unsteady than normal. Hx stroke.

## 2021-03-12 NOTE — ED Provider Notes (Signed)
Emergency Department Provider Note   I have reviewed the triage vital signs and the nursing notes.   HISTORY  Chief Complaint Aphasia   HPI Sheila Guerrero is a 60 y.o. female with complicated recent past medical history including culture-negative endocarditis, subacute MCA stroke, diabetes, hypertension, currently on outpatient daptomycin and Rocephin through PICC line presents to the emergency department with vomiting and somnolence today along with increased gait instability, apparent word finding difficulty earlier, and fatigue.  Patient states that she has been recovering since her recent hospitalization.  She had PT/OT/speech therapy yesterday and felt like she was doing well.  Today she has had multiple episodes of vomiting without abdominal or chest pain.  She has felt fatigued and sleeping most of the day.  She tells me that her husband came to check on her this afternoon and felt like she was having difficulty finding her words and sometimes saying inappropriate words.  She feels like that is mostly resolved.  She is also noticed that when she is up and walking she feels much more unstable than she did even yesterday.  She is not having obvious unilateral weakness or numbness.  She is feeling cold at times but denies shaking chills or recorded fevers.  Denies any chest pain or shortness of breath symptoms. No radiation of symptoms or modifying factors.   Past Medical History:  Diagnosis Date  . Angioedema   . Anxiety   . Asthma   . Depression   . Diabetes mellitus, type II ([place])   . Dyslipidemia   . Family history of adverse reaction to anesthesia    mother severely confused after general anesthesia  . History of CVA (cerebrovascular accident)   . HTN (hypertension) 2013  . Panic attacks   . Pneumonia   . Septic embolism ([place]) 02/10/2021  . Situational stress   . Subacute endocarditis 02/10/2021    Patient Active Problem List   Diagnosis Date Noted  . Cerebral  thrombosis with cerebral infarction 03/13/2021  . Acute encephalopathy 03/12/2021  . Bacteremia   . Septic embolism ([place]) 02/10/2021  . Subacute endocarditis 02/10/2021  . Malnutrition of moderate degree 02/09/2021  . Acute ischemic stroke ([place]) 02/08/2021  . Infarction of right thalamus ([place]) 02/07/2021  . Essential hypertension 02/07/2021  . Paresthesia 02/02/2021  . Gait abnormality 02/02/2021  . Confusion 05/08/2018  . Stroke ([place]) 03/17/2017  . Type 2 diabetes mellitus with hyperlipidemia ([place]) 03/17/2017  . Confusion state 02/16/2017  . Angioedema 02/16/2017  . Panic 02/04/2017  . Adjustment disorder with anxious mood 02/04/2017  . Sinusitis, chronic 12/28/2011  . Rhinitis, allergic 12/28/2011  . Extrinsic asthma 12/07/2011    Past Surgical History:  Procedure Laterality Date  . BUBBLE STUDY  02/10/2021   Procedure: BUBBLE STUDY;  Surgeon: Dixie Dials, MD;  Location: [facility];  Service: Cardiovascular;;  . CHOLECYSTECTOMY  2010  . TEE WITHOUT CARDIOVERSION N/A 02/10/2021   Procedure: TRANSESOPHAGEAL ECHOCARDIOGRAM (TEE);  Surgeon: Dixie Dials, MD;  Location: [facility] ENDOSCOPY;  Service: Cardiovascular;  Laterality: N/A;    Allergies Sulfa antibiotics, Dulaglutide, Empagliflozin, Iodinated diagnostic agents, and Latex  Family History  Problem Relation Age of Onset  . Hypertension Mother   . Other Mother        blood disorder  . Hypertension Father   . Stroke Father   . Diabetes Maternal Aunt     Social History Social History   Tobacco Use  . Smoking status: Never Smoker  . Smokeless tobacco: Never Used  Substance Use Topics  . Alcohol use: Yes    Comment: rarely  . Drug use: Never    Review of Systems  Constitutional: No fever/chills Eyes: No visual changes. ENT: No sore throat. Cardiovascular: Denies chest pain. Respiratory: Denies shortness of breath. Gastrointestinal: No abdominal pain. Positive nausea and vomiting.  No diarrhea.  No  constipation. Genitourinary: Negative for dysuria. Musculoskeletal: Negative for back pain. Skin: Negative for rash. Neurological: Negative for headaches, focal weakness or numbness. Positive gait instability and word findings difficulty.   10-point ROS otherwise negative.  ____________________________________________   PHYSICAL EXAM:  VITAL SIGNS: ED Triage Vitals  Enc Vitals Group     BP 03/12/21 1836 126/81     Pulse Rate 03/12/21 1836 (!) 101     Resp 03/12/21 1836 16     Temp 03/12/21 1836 98.2 F (36.8 C)     Temp Source 03/12/21 1836 Oral     SpO2 03/12/21 1836 99 %   Constitutional: Alert and oriented. Well appearing and in no acute distress. Eyes: Conjunctivae are normal. PERRL. EMI.O Head: Atraumatic. Nose: No congestion/rhinnorhea. Mouth/Throat: Mucous membranes are moist.   Neck: No stridor.   Cardiovascular: Normal rate, regular rhythm. Good peripheral circulation. Grossly normal heart sounds.   Respiratory: Normal respiratory effort.  No retractions. Lungs CTAB. Gastrointestinal: Soft and nontender. No distention.  Musculoskeletal: No lower extremity tenderness nor edema. No gross deformities of extremities. Neurologic:  Normal speech and language. No gross focal neurologic deficits are appreciated.  No cranial nerve deficits appreciated.  5/5 strength in the bilateral upper and lower extremities with equal sensation.  Skin:  Skin is warm, dry and intact. No rash noted.   ____________________________________________   LABS (all labs ordered are listed, but only abnormal results are displayed)  Labs Reviewed  COMPREHENSIVE METABOLIC PANEL - Abnormal; Notable for the following components:      Result Value   Glucose, Bld 207 (*)    Total Protein 6.4 (*)    All other components within normal limits  URINALYSIS, ROUTINE W REFLEX MICROSCOPIC - Abnormal; Notable for the following components:   APPearance HAZY (*)    Glucose, UA >=500 (*)    Protein, ur 30  (*)    Leukocytes,Ua SMALL (*)    Bacteria, UA RARE (*)    All other components within normal limits  CBC WITH DIFFERENTIAL/PLATELET - Abnormal; Notable for the following components:   RBC 5.42 (*)    Hemoglobin 15.7 (*)    HCT 48.0 (*)    All other components within normal limits  LIPID PANEL - Abnormal; Notable for the following components:   Cholesterol 284 (*)    Triglycerides 387 (*)    HDL 34 (*)    VLDL 77 (*)    LDL Cholesterol 173 (*)    All other components within normal limits  CK - Abnormal; Notable for the following components:   Total CK 27 (*)    All other components within normal limits  I-STAT BETA HCG BLOOD, ED ([facility], [facility], [facility] ONLY) - Abnormal; Notable for the following components:   I-stat hCG, quantitative 5.0 (*)    All other components within normal limits  CBG MONITORING, ED - Abnormal; Notable for the following components:   Glucose-Capillary 181 (*)    All other components within normal limits  CBG MONITORING, ED - Abnormal; Notable for the following components:   Glucose-Capillary 296 (*)    All other components within normal limits  CBG MONITORING, ED -  Abnormal; Notable for the following components:   Glucose-Capillary 215 (*)    All other components within normal limits  CULTURE, BLOOD (ROUTINE X 2)  CULTURE, BLOOD (ROUTINE X 2)  SARS CORONAVIRUS 2 (TAT 6-24 HRS)  ETHANOL  PROTIME-INR  APTT  RAPID URINE DRUG SCREEN, HOSP PERFORMED  CBC WITH DIFFERENTIAL/PLATELET   ____________________________________________  EKG   EKG Interpretation  Date/Time:  Thursday March 12 2021 18:51:15 EDT Ventricular Rate:  100 PR Interval:  154 QRS Duration: 76 QT Interval:  356 QTC Calculation: 459 R Axis:   48 Text Interpretation: Normal sinus rhythm Nonspecific T wave abnormality Abnormal ECG Confirmed by Merrily Pew [phone]) on 03/14/2021 6:53:51 AM       ____________________________________________  RADIOLOGY  CT head  reviewed ____________________________________________   PROCEDURES  Procedure(s) performed:   Procedures  CRITICAL CARE Performed by: Margette Fast Total critical care time: 35 minutes Critical care time was exclusive of separately billable procedures and treating other patients. Critical care was necessary to treat or prevent imminent or life-threatening deterioration. Critical care was time spent personally by me on the following activities: development of treatment plan with patient and/or surrogate as well as nursing, discussions with consultants, evaluation of patient's response to treatment, examination of patient, obtaining history from patient or surrogate, ordering and performing treatments and interventions, ordering and review of laboratory studies, ordering and review of radiographic studies, pulse oximetry and re-evaluation of patient's condition.  Nanda Quinton, MD Emergency Medicine  ____________________________________________   INITIAL IMPRESSION / ASSESSMENT AND PLAN / ED COURSE  Pertinent labs & imaging results that were available during my care of the patient were reviewed by me and considered in my medical decision making (see chart for details).   Patient presents to the emergency department with vomiting today along with fatigue, feeling cold, question of new neuro deficits in the setting of endocarditis and history of recent stroke thought to be secondary to septic emboli.  She is afebrile here although mildly tachycardic.  I do not appreciate any focal neurologic deficits. ? TIA. Labs and CT head ordered with [facility]. LSN at 8 PM yesterday. No LVO concern. Plan for labs, CT, and likely repeat MRI.   08:40 PM  Spoke with Dr. Quinn Axe with neurology.  Recommends MRI with and without contrast of the brain along with MRA of the head and neck.  Will also want EEG which has been ordered and they will consult.   Discussed patient's case with [facility] to request admission. Patient  and family (if present) updated with plan. Care transferred to [facility] service.  I reviewed all nursing notes, vitals, pertinent old records, EKGs, labs, imaging (as available).  ____________________________________________  FINAL CLINICAL IMPRESSION(S) / ED DIAGNOSES  Final diagnoses:  Transient alteration of awareness  Speech disturbance, unspecified type     MEDICATIONS GIVEN DURING THIS VISIT:  Medications  gadobutrol (GADAVIST) 1 MMOL/ML injection 8 mL (8 mLs Intravenous Contrast Given 03/13/21 0155)     NEW OUTPATIENT MEDICATIONS STARTED DURING THIS VISIT:  Discharge Medication List as of 03/13/2021  2:33 PM    START taking these medications   Details  atorvastatin (LIPITOR) 80 MG tablet Take 1 tablet (80 mg total) by mouth daily., Starting Mon 03/23/2021, Until Tue 03/23/2022, Normal    ezetimibe (ZETIA) 10 MG tablet Take 1 tablet (10 mg total) by mouth daily for 10 days., Starting Fri 03/13/2021, Until Mon 03/23/2021, Normal        Note:  This document was prepared using Dragon  voice recognition software and may include unintentional dictation errors.  Nanda Quinton, MD, [facility] Emergency Medicine    Kirat Mezquita, [facility], MD 03/15/21 [phone]

## 2021-03-12 NOTE — ED Notes (Signed)
eeg remains at bedside with pt at this time. Husband returned to room with food at this time as well

## 2021-03-13 ENCOUNTER — Observation Stay (HOSPITAL_COMMUNITY): Payer: 59

## 2021-03-13 DIAGNOSIS — I10 Essential (primary) hypertension: Secondary | ICD-10-CM

## 2021-03-13 DIAGNOSIS — G9340 Encephalopathy, unspecified: Secondary | ICD-10-CM | POA: Diagnosis not present

## 2021-03-13 DIAGNOSIS — R29818 Other symptoms and signs involving the nervous system: Secondary | ICD-10-CM | POA: Diagnosis not present

## 2021-03-13 DIAGNOSIS — I6330 Cerebral infarction due to thrombosis of unspecified cerebral artery: Secondary | ICD-10-CM | POA: Insufficient documentation

## 2021-03-13 DIAGNOSIS — E785 Hyperlipidemia, unspecified: Secondary | ICD-10-CM

## 2021-03-13 DIAGNOSIS — E782 Mixed hyperlipidemia: Secondary | ICD-10-CM

## 2021-03-13 DIAGNOSIS — R4701 Aphasia: Secondary | ICD-10-CM

## 2021-03-13 DIAGNOSIS — E1169 Type 2 diabetes mellitus with other specified complication: Secondary | ICD-10-CM

## 2021-03-13 DIAGNOSIS — I330 Acute and subacute infective endocarditis: Secondary | ICD-10-CM

## 2021-03-13 LAB — CBG MONITORING, ED
Glucose-Capillary: 181 mg/dL — ABNORMAL HIGH (ref 70–99)
Glucose-Capillary: 296 mg/dL — ABNORMAL HIGH (ref 70–99)
Glucose-Capillary: 215 mg/dL — ABNORMAL HIGH (ref 70–99)

## 2021-03-13 LAB — LIPID PANEL
Cholesterol: 284 mg/dL — ABNORMAL HIGH (ref 0–200)
HDL: 34 mg/dL — ABNORMAL LOW (ref >40)
LDL Cholesterol: 173 mg/dL — ABNORMAL HIGH (ref 0–99)
Total CHOL/HDL Ratio: 8.4 RATIO
Triglycerides: 387 mg/dL — ABNORMAL HIGH (ref <150)
VLDL: 77 mg/dL — ABNORMAL HIGH (ref 0–40)

## 2021-03-13 LAB — CK: Total CK: 27 U/L — ABNORMAL LOW (ref 38–234)

## 2021-03-13 LAB — SARS CORONAVIRUS 2 (TAT 6-24 HRS): SARS Coronavirus 2: NEGATIVE

## 2021-03-13 MED ORDER — CLOPIDOGREL BISULFATE 75 MG PO TABS: 75.0000 mg | ORAL_TABLET | Freq: Every day | ORAL | 0 refills | Status: AC

## 2021-03-13 MED ORDER — ATORVASTATIN CALCIUM 80 MG PO TABS: 80.0000 mg | ORAL_TABLET | Freq: Every day | ORAL | 11 refills | Status: DC

## 2021-03-13 MED ORDER — PREGABALIN 50 MG PO CAPS
150.0000 mg | ORAL_CAPSULE | Freq: Two times a day (BID) | ORAL | Status: DC
  Administered 2021-03-13: 150 mg via ORAL
  Filled 2021-03-13: qty 3

## 2021-03-13 MED ORDER — LABETALOL HCL 5 MG/ML IV SOLN: 10.0000 mg | INTRAVENOUS | Status: DC | PRN

## 2021-03-13 MED ORDER — GADOBUTROL 1 MMOL/ML IV SOLN
8.0000 mL | Freq: Once | INTRAVENOUS | Status: AC | PRN
  Administered 2021-03-13: 8 mL via INTRAVENOUS

## 2021-03-13 MED ORDER — TRAMADOL HCL 50 MG PO TABS
50.0000 mg | ORAL_TABLET | Freq: Two times a day (BID) | ORAL | Status: DC
  Administered 2021-03-13 (×2): 50 mg via ORAL
  Filled 2021-03-13 (×2): qty 1

## 2021-03-13 MED ORDER — ASPIRIN EC 81 MG PO TBEC
81.0000 mg | DELAYED_RELEASE_TABLET | Freq: Every day | ORAL | Status: DC
  Administered 2021-03-13: 81 mg via ORAL
  Filled 2021-03-13: qty 1

## 2021-03-13 MED ORDER — ATORVASTATIN CALCIUM 80 MG PO TABS
80.0000 mg | ORAL_TABLET | Freq: Every day | ORAL | Status: DC
  Administered 2021-03-13: 80 mg via ORAL
  Filled 2021-03-13: qty 1

## 2021-03-13 MED ORDER — EZETIMIBE 10 MG PO TABS: 10.0000 mg | ORAL_TABLET | Freq: Every day | ORAL | 0 refills | Status: DC

## 2021-03-13 NOTE — ED Notes (Signed)
PT at bedside.

## 2021-03-13 NOTE — Procedures (Signed)
Routine EEG Report  Sheila Guerrero is a 60 y.o. female with a history of R MCA infarct with new onset aphasia who is undergoing an EEG to evaluate for seizures.  Report: This EEG was acquired with electrodes placed according to the International 10-20 electrode system (including Fp1, Fp2, F3, F4, C3, C4, P3, P4, O1, O2, T3, T4, T5, T6, A1, A2, Fz, Cz, Pz). The following electrodes were missing or displaced: none.  The occipital dominant rhythm was 8.5 Hz. This activity is reactive to stimulation. Drowsiness was manifested by background fragmentation; deeper stages of sleep were not identified. There was no focal slowing. There were no interictal epileptiform discharges. There were no electrographic seizures identified. There was no abnormal response to photic stimulation or hyperventilation.   Impression: This EEG was obtained while awake and drowsy and is normal.    Clinical Correlation: Normal EEGs, however, do not rule out epilepsy.  Su Monks, MD [facility] [phone]  If 7pm- 7am, please page neurology on call as listed in [place].

## 2021-03-13 NOTE — Discharge Instructions (Signed)
For your high cholesterol: Take Zetia until finished with antibiotics then start taking Lipitor daily.

## 2021-03-13 NOTE — ED Notes (Signed)
Remains in MRI at this time 

## 2021-03-13 NOTE — Progress Notes (Addendum)
STROKE TEAM PROGRESS NOTE   INTERVAL HISTORY Patient with uncontrolled DM2, HLD and HTN. Admitted last month with  left hemibody paresthesias due to acute/subacute right MCA territory stroke, found incidentally on stroke work up to have Mitral valve/tricuspid valve vegetation, culture negative endocarditis for which Daptomycin and Rocephin regimen x 6 weeks was started. No statin given in setting of daptomycin therapy with concern for possible rhabdomyolysis. She was sent home on this regimen with DAPT x3 weeks. She had been off plavix x one day when this event occurred. Left hemibody paresthesias have persisted (on Lyrica).   Patient recalls she woke up yesterday morning and was immediately nauseated, then began to vomit. She felt unwell in general and then began to have word finding difficulty, fatigue and gait instability for which she presented to the ED.     She reports medication compliance with all medications. Blood glucose remains out of control with blood glucose running high and has been 300s past couple of days. A1C 12 on admission.  She went to see an Endocrinologist in the past but did not have a good experience.   Today she is feeling much better with resolution of nausea and vomiting and much improved speech. Denies fatigue/somnolence. Therapy has cleared her to return home with outpatient therapies.   They are frustrated with ED wait time and would like to leave today, even against medical advice. Husband has been up 36 hours due to his work and coming in to ED. I have provided education regarding the process of plan formulation and coordinating medical teams. Provided supportive conversation regarding their frustrations and encouraged patience as the process unfolds.   Explained stroke diagnosis, work up and ongoing plan of care including importance of control of stroke risk factors. Questions addressed.  Discussed with Katharine Look, RN.   Vitals:   03/13/21 0230 03/13/21 0241  03/13/21 0300 03/13/21 0750  BP: (!) 141/78  140/86 140/79  Pulse: 97  97 (!) 108  Resp: 14  15 (!) 21  Temp:  98.1 F (36.7 C)  98.1 F (36.7 C)  TempSrc:  Oral  Oral  SpO2: 95%  96% 98%  Weight:      Height:       CBC:  Recent Labs  Lab 03/12/21 1842  WBC 8.1  NEUTROABS 4.5  HGB 15.7*  HCT 48.0*  MCV 88.6  PLT 834   Basic Metabolic Panel:  Recent Labs  Lab 03/12/21 1842  NA 138  K 3.8  CL 104  CO2 26  GLUCOSE 207*  BUN 17  CREATININE 0.78  CALCIUM 9.2   Lipid Panel:  Recent Labs  Lab 03/13/21 0445  CHOL 284*  TRIG 387*  HDL 34*  CHOLHDL 8.4  VLDL 77*  LDLCALC 173*   HgbA1c: No results for input(s): HGBA1C in the last 168 hours. Urine Drug Screen:  Recent Labs  Lab 03/12/21 2119  LABOPIA NONE DETECTED  COCAINSCRNUR NONE DETECTED  LABBENZ NONE DETECTED  AMPHETMU NONE DETECTED  THCU NONE DETECTED  LABBARB NONE DETECTED    Alcohol Level  Recent Labs  Lab 03/12/21 2039  ETH <10   IMAGING  MRI Brain  1. Acute to early subacute right and likely late subacute left thalamic infarcts. No mass effect or acute hemorrhage. 2. Severe stenosis of the distal right MCA M1 segment. 3. Severe stenosis of the distal right V4 segment. 4. No stenosis of the carotid or vertebral arteries in the neck  MRA Head and neck 1. Acute to  early subacute right and likely late subacute left thalamic infarcts. No mass effect or acute hemorrhage. 2. Severe stenosis of the distal right MCA M1 segment. 3. Severe stenosis of the distal right V4 segment. 4. No stenosis of the carotid or vertebral arteries in the neck  CT head 1. Generalized cerebral atrophy. 2. Chronic bilateral basal ganglia lacunar infarcts. 3. No acute intracranial abnormality.   Last visit TEE 02/10/21 FINDINGS  Left Ventricle: Left ventricular ejection fraction, by estimation, is 45  to 50%. The left ventricle has mildly decreased function. The left  ventricle demonstrates global hypokinesis.  The left ventricular internal  cavity size was normal in size. There is  borderline concentric left ventricular hypertrophy. Left ventricular  diastolic function could not be evaluated.   Right Ventricle: The right ventricular size is normal. No increase in  right ventricular wall thickness. Right ventricular systolic function is  normal.   Left Atrium: Left atrial size was moderately dilated. No left atrial/left  atrial appendage thrombus was detected.   Right Atrium: Right atrial size was mildly dilated.   Pericardium: Trivial pericardial effusion is present. The pericardial  effusion is posterior to the left ventricle. There is no evidence of  cardiac tamponade.   Mitral Valve: Mobile small MV vegetations seen on both leaflets. The  mitral valve is normal in structure. Normal mobility of the mitral valve  leaflets. Moderate mitral valve regurgitation, with centrally-directed  jet.   Tricuspid Valve: Sessile cauliflower shaped vegetation measuring 0.428 x  0.712 cm on anterior leaflet mid portion of TV. The tricuspid valve is  normal in structure. Tricuspid valve regurgitation is mild . No evidence  of tricuspid stenosis.   Aortic Valve: The aortic valve is tricuspid. Aortic valve regurgitation is  not visualized. No aortic stenosis is present. Aortic valve mean gradient  measures 3.0 mmHg. Aortic valve peak gradient measures 4.8 mmHg.   Pulmonic Valve: The pulmonic valve was normal in structure. Pulmonic valve  regurgitation is trivial.   Aorta: The aortic root is normal in size and structure. There is mild  (Grade II) atheroma plaque involving the descending aorta and ascending  aorta.   Venous: The left lower pulmonary vein and right lower pulmonary vein are  normal. The inferior vena cava is normal in size with greater than 50%  respiratory variability, suggesting right atrial pressure of 3 mmHg.   IAS/Shunts: The interatrial septum appears to be lipomatous. Evidence  of  atrial level shunting detected by color flow Doppler. Agitated saline  contrast was given intravenously to evaluate for intracardiac shunting.  Agitated saline contrast bubble study  was negative, with no evidence of any interatrial shunt. A small patent  foramen ovale is detected with predominantly right to left shunting across  the atrial septum.    PHYSICAL EXAM Temp:  [98.1 F (36.7 C)-98.4 F (36.9 C)] 98.1 F (36.7 C) (04/15 0750) Pulse Rate:  [97-108] 100 (04/15 1100) Resp:  [13-21] 13 (04/15 1100) BP: (126-169)/(78-101) 135/78 (04/15 1100) SpO2:  [95 %-100 %] 95 % (04/15 1100) Weight:  [80.7 kg] 80.7 kg (04/14 1959)  General - Obese female sitting up on side of ED stretcher. Anxious but otherwise in NAD.  Cardiovascular - Regular rhythm and rate. Skin- Warm and dry Psych- Anxious   Mental Status -  Level of arousal and orientation to time, place, and person were intact. Language including expression, naming, repetition, comprehension was assessed and found intact.  Attention span and concentration were normal. Recent and remote memory  were intact  Cranial Nerves II - XII - II - Visual field intact OU. III, IV, VI - Extraocular movements intact. V - Facial sensation is intact with paresthesia reported on the left V1-V3 as constant and worsened with touch. VII - Facial movement symmetric bilaterally. VIII - Hearing & vestibular intact bilaterally. X - Palate elevates symmetrically. XI - Chin turning & shoulder shrug intact bilaterally. XII - Tongue protrusion intact.  Motor Strength - The patient's strength was normal in all extremities and pronator drift was absent.  Bulk was normal and fasciculations were absent.  Motor Tone - Muscle tone was assessed at the neck and appendages and was normal.   Sensory - Light touch was assessed and was symmetrical with paresthesia reported on the left hemibody as  constant and worsened with touch.    Coordination - The  patient had normal movements in the hands and feet with no ataxia or dysmetria.  Tremor was absent.   Gait and Station - deferred.   ASSESSMENT/PLAN Sheila Guerrero is a 60 y.o. female with complicated recent past medical history including culture-negative endocarditis, subacute MCA stroke, left hemibody painful paresthesias (on Lyrica), uncontrolled diabetes, HLD HTN currently on outpatient daptomycin and Rocephin through PICC line presents to the emergency department with vomiting, somnolence, word finding difficulty and gait instability. Now with new right thalamic stroke on MRI. Symptoms are much improved/resolved today.   Acute to early subacute right and likely late subacute left thalamic infarcts likely due to SVD   DAPT with plavix 75 and ASA 81mg  x 3 weeks then ASA alone.   Patient had been off plavix x one day when event occurred. She returned to ASA 81 mg.  VTE prophylaxis - is recommended  Therapy recommendations:  Outpatient PT/OT/ST  Disposition:  TBD  Hypertension  Stable . Permissive hypertension (OK if < 220/120) but gradually normalize in 5-7 days . Long-term BP goal normotensive  Hyperlipidemia  Home meds:  None d/t concern for possible rhabdomyolysis in setting of Daptomycin therapy  LDL 173, goal < 70, of note marked increase in LDL from 55 to 173 since last month, also total chol 130 to 284.   Per discussion with Suezanne Jacquet, pharmacist,  re: increased cholesterol and statin/daptomycin combination concerns she only has about a week of antibiotic therapy left. He suggests that if she remains inpatient starting moderate statin and checking CK QOD but if leaving then adding zetia only for the remaining antibiotic time and then bumping up to high intensity statin at that point. Recommend Lipitor 40mg  for moderate dose if staying. If leaving then after antibiotic doses are finished at home can transition from Zetia to 80mg  Lipitor.   Diabetes type II  Uncontrolled  HgbA1c 12.1, goal < 7.0  CBGs  Management per primary team   May need endocrinology referral  Close PCP follow up for better DM control  Other Stroke Risk Factors  Obesity, Body mass index is 30.29 kg/m., BMI >/= 30 associated with increased stroke risk, recommend weight loss, diet and exercise as appropriate   Hx stroke  Family hx stroke (Father- stroke)  Other [facility] day # 0  This plan of care was directed by Dr. Erlinda Hong. Hetty Blend, NP-C  ATTENDING NOTE: I reviewed above note and agree with the assessment and plan. Pt was seen and examined.   60 year old female with history of hypertension, hyperlipidemia, diabetes, stroke in 2017 and 01/2021, endocarditis on IV antibiotics admitted for confusion, incoherent speech, somnolence, gait  difficulty.  Patient and husband stated that in 2017 patient had stroke causing her not able to handle any normal work, she has not been working since then.  In 01/2021, she was admitted for left-sided numbness and weakness.  MRI showed right thalamic and left MCA/PCA territory subacute infarct.  MRI showed right M1 severe stenosis, right V4 moderate stenosis.  Carotid Doppler negative.  EF 40 to 45%, LDL 55, A1c 12.1.  She was put on DAPT and Lipitor 80 on discharge.  However, her TEE showed mitral valve vegetation, concerning for endocarditis, she was also put on IV infusion of daptomycin and Rocephin through PICC line.  Patient had residual left-sided numbness.  On this admission, patient CT no acute abnormality.  EEG negative for seizure.  MRI showed left small anterior thalamic infarct.  MRA head and neck severe right M1 and V4 stenosis.  LDL 173, UDS negative, creatinine 0.78.  Eye exam, patient lying in bed, husband at bedside.  Patient awake alert, orientated x3.  Speech fluent, no aphasia, follows simple commands.  Neuro exam only showed left face arm and leg decreased light touch sensation but  otherwise neurologically intact.  Etiology for patient current stroke still most likely due to small vessel disease given the location and risk factors.  Had extensive discussion regarding risk factor modification.  Recommend aspirin Plavix DAPT for 3 weeks and then either aspirin or Plavix after.  Patient LDL dramatically increased from last admission likely due to of statin in the setting of daptomycin IV infusion.  Discussed with pharmacy, will recommend Zetia for the rest of daptomycin duration (about 1 week), then switched to Lipitor 80 long-term.  Patient and husband agree with the plan.  PT/OT recommend outpatient PT/OT.  Neurology will sign off. Please call with questions. Pt will follow up with Dr. Jannifer Franklin at [facility] in about 4 weeks. Thanks for the consult.   Rosalin Hawking, MD PhD Stroke Neurology 03/13/2021 2:32 PM     To contact Stroke Continuity provider, please refer to [email or web address] After hours, contact General Neurology

## 2021-03-13 NOTE — Progress Notes (Signed)
Inpatient Diabetes Program Recommendations  AACE/ADA: New Consensus Statement on Inpatient Glycemic Control   Target Ranges:  Prepandial:   less than 140 mg/dL      Peak postprandial:   less than 180 mg/dL (1-2 hours)      Critically ill patients:  140 - 180 mg/dL   Results for Sheila Guerrero, Sheila "[name]" (MRN [id number]) as of 03/13/2021 11:40  Ref. Range 03/13/2021 02:38 03/13/2021 07:42  Glucose-Capillary Latest Ref Range: 70 - 99 mg/dL 181 (H) 296 (H)  Results for Sheila Guerrero, Sheila "[name]" (MRN [id number]) as of 03/13/2021 11:40  Ref. Range 02/09/2021 15:23  Hemoglobin A1C Latest Ref Range: 4.8 - 5.6 % 12.1 (H)   Review of Glycemic Control  Diabetes history: DM2 Outpatient Diabetes medications: Lantus 20 units QHS, Metformin XR 2000 mg daily Current orders for Inpatient glycemic control: Lantus 20 units QHS, Novolog 0-9 units TID with meals  Inpatient Diabetes Program Recommendations:    Insulin: Please consider increasing Novolog correction to Novolog 0-15 units TID with meals, adding Novolog 0-5 units QHS for bedtime correction, and ordering Novolog 4 units TID with meals for meal coverage if patient eats at least 50% of meals.  HbgA1C: A1C 12.1% on 02/09/21. Inpatient diabetes coordinator spoke with patient on 02/09/21 during last hospital admission.   Thanks, Barnie Alderman, RN, MSN, CDE Diabetes Coordinator Inpatient Diabetes Program [phone] (Team Pager from 8am to 5pm)

## 2021-03-13 NOTE — ED Notes (Signed)
Remains in MRI 

## 2021-03-13 NOTE — ED Notes (Signed)
Patient transported to MRI 

## 2021-03-13 NOTE — Discharge Summary (Signed)
Physician Discharge Summary  ATTALLAH ONTKO PTW:[id number] DOB: 27-Sep-1961 DOA: 03/12/2021  PCP: Antony Contras, MD  Admit date: 03/12/2021 Discharge date: 03/13/2021  Admitted From: Home Disposition:  home  Recommendations for Outpatient Follow-up:  1. Follow up with PCP in 1-2 weeks 2. Please obtain BMP/CBC in one week 3. Please help patient improve glycemic control and hypertension. 4. Zetia for the next 1 week until antibiotics are complete then resume high-dose statin. 5. Continue antibiotics until complete, blood cultures are pending from this hospitalization.  Continue follow-up with infectious disease.  Home Health: No Equipment/Devices: None  Discharge Condition: Stable CODE STATUS: Full code Diet recommendation: Heart healthy, carb modified  Brief/Interim Summary: Sheila Guerrero is a 60 y.o. 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  Patient also had nausea and vomiting. CT head was unremarkable.  On-call neurologist was consulted.  Patient admitted for further work-up for possible seizures versus new stroke.   Discharge Diagnoses:  Principal Problem:   Acute encephalopathy Active Problems:   Extrinsic asthma   Type 2 diabetes mellitus with hyperlipidemia (HCC)   Essential hypertension   Subacute endocarditis   Cerebral thrombosis with cerebral infarction  Difficulty speaking with ataxia and confusion concerning for stroke versus seizure for which neurologist on-call has been consulted.  EEG and MRI brain MRA brain has been ordered.  Based on which will a further plan.  Since patient has had recent endocarditis will order repeat blood cultures.  Patient passed swallow.  Neurochecks.  Physical therapy consult.  On Plavix.  Holding of aspirin  since patient has history of angioedema per the report.  Statins.  Culture negative mitral valve and tricuspid valve endocarditis on daptomycin and ceftriaxone.  Patient has 1 more week of antibiotics to complete.  Will repeat blood cultures, pending at time of discharge.  Cardiomyopathy last EF measured was 45 to 50%.    Resume Entresto and carvedilol to allow for permissive hypertension   Diabetes mellitus type 2 on Lantus 20 units.  Last hemoglobin A1c was 12.1 Advise improved glycemic control with goal A1c less than 7.  Patient may increase her overnight Lantus and discussed with primary care provider about addition of meal coverage.  Pain related to recent thalamic stroke on Lyrica and if there is no seizures tramadol will be restarted.   Discharge Instructions:  Discharge Instructions    Ambulatory referral to Neurology   Complete by: As directed    An appointment is requested in approximately:4 weeks   Call MD for:  persistant nausea and vomiting   Complete by: As directed    Call MD for:  severe uncontrolled pain   Complete by: As directed    Call MD for:  temperature >100.4   Complete by: As directed    Diet - low sodium heart healthy   Complete by: As directed    Increase activity slowly   Complete by: As directed      Allergies as of 03/13/2021      Reactions   Sulfa Antibiotics Swelling   Dulaglutide    Other reaction(s): rash   Empagliflozin    Other reaction(s): recurrent yeast   Iodinated Diagnostic Agents    Other reaction(s): angioedema with CT dye   Latex Rash      Medication List    STOP taking these medications  insulin detemir 100 UNIT/ML FlexPen Commonly known as: LEVEMIR   lisinopril 20 MG tablet Commonly known as: ZESTRIL     TAKE these medications   acetaminophen 500 MG tablet Commonly known as: TYLENOL Take 1,000 mg by mouth every 6 (six) hours as needed for moderate pain.   aspirin 81 MG EC tablet Take 1 tablet (81 mg total) by  mouth daily. Swallow whole.   atorvastatin 80 MG tablet Commonly known as: Lipitor Take 1 tablet (80 mg total) by mouth daily. Start taking on: March 23, 2021   carvedilol 6.25 MG tablet Commonly known as: COREG Take 6.25 mg by mouth 2 (two) times daily with a meal. What changed: Another medication with the same name was removed. Continue taking this medication, and follow the directions you see here.   cefTRIAXone  IVPB Commonly known as: ROCEPHIN Inject 2 g into the vein every 12 (twelve) hours. Indication: Endocarditis First Dose: No Last Day of Therapy:  03/18/2021 Labs - Once weekly:  CBC/D and BMP, Labs - Every other week:  ESR and CRP Method of administration: IV Push Method of administration may be changed at the discretion of home infusion pharmacist based upon assessment of the patient and/or caregiver's ability to self-administer the medication ordered.   clopidogrel 75 MG tablet Commonly known as: PLAVIX Take 1 tablet (75 mg total) by mouth daily for 21 days. Start taking on: March 14, 2021   daptomycin  IVPB Commonly known as: CUBICIN Inject 800 mg into the vein daily. Indication:  Endocarditis First Dose: No Last Day of Therapy:  03/18/2021 Labs - Once weekly:  CBC/D, BMP, and CPK Labs - Every other week:  ESR and CRP Method of administration: IV Push Method of administration may be changed at the discretion of home infusion pharmacist based upon assessment of the patient and/or caregiver's ability to self-administer the medication ordered.   EPINEPHrine 0.3 mg/0.3 mL Soaj injection Commonly known as: EPI-PEN Inject 0.3 mg into the muscle daily as needed for anaphylaxis.   ezetimibe 10 MG tablet Commonly known as: Zetia Take 1 tablet (10 mg total) by mouth daily for 10 days.   Lantus SoloStar 100 UNIT/ML Solostar Pen Generic drug: insulin glargine Inject 20 Units into the skin at bedtime.   metFORMIN 500 MG 24 hr tablet Commonly known as:  GLUCOPHAGE-XR Take 2,000 mg by mouth daily.   Pen Needles 29G X 12MM Misc 1 each by Does not apply route daily.   pregabalin 75 MG capsule Commonly known as: LYRICA Take 150 mg by mouth 2 (two) times daily.   sacubitril-valsartan 24-26 MG Commonly known as: ENTRESTO Take 1 tablet by mouth 2 (two) times daily.   traMADol 50 MG tablet Commonly known as: ULTRAM Take 50 mg by mouth 2 (two) times daily as needed for moderate pain.       Follow-up Information    Antony Contras, MD Follow up.   Specialty: Family Medicine Contact information: [place] [address] [phone]        Kathrynn Ducking, MD. Schedule an appointment as soon as possible for a visit in 4 week(s).   Specialty: Neurology Contact information: [address] [place] [address] [phone]              Allergies  Allergen Reactions  . Sulfa Antibiotics Swelling  . Dulaglutide     Other reaction(s): rash  . Empagliflozin     Other reaction(s): recurrent yeast  .  Iodinated Diagnostic Agents     Other reaction(s): angioedema with CT dye  . Latex Rash    Consultations:  Neurology   Procedures/Studies: CT HEAD WO CONTRAST  Result Date: 03/12/2021 CLINICAL DATA:  Aphasia. EXAM: CT HEAD WITHOUT CONTRAST TECHNIQUE: Contiguous axial images were obtained from the base of the skull through the vertex without intravenous contrast. COMPARISON:  MR head dated February 09, 2021 FINDINGS: Brain: There is mild cerebral atrophy with widening of the extra-axial spaces and ventricular dilatation. There are areas of decreased attenuation within the white matter tracts of the supratentorial brain, consistent with microvascular disease changes. Chronic bilateral basal ganglia lacunar infarcts are noted. Vascular: No hyperdense vessel or unexpected calcification. Skull: Normal. Negative for fracture or focal lesion. Sinuses/Orbits: No acute finding. Other: None.  IMPRESSION: 1. Generalized cerebral atrophy. 2. Chronic bilateral basal ganglia lacunar infarcts. 3. No acute intracranial abnormality. Electronically Signed   By: Virgina Norfolk M.D.   On: 03/12/2021 19:56   MR ANGIO HEAD WO CONTRAST  Result Date: 03/13/2021 CLINICAL DATA:  Aphasia, nausea and vomiting. EXAM: MRI HEAD WITHOUT AND WITH CONTRAST MRA HEAD WITHOUT CONTRAST MRA NECK WITHOUT AND WITH CONTRAST TECHNIQUE: Multiplanar, multiecho pulse sequences of the brain and surrounding structures were obtained without and with intravenous contrast. Angiographic images of the Circle of Willis were obtained using MRA technique without intravenous contrast. Angiographic images of the neck were obtained using MRA technique without and with intravenous contrast. Carotid stenosis measurements (when applicable) are obtained utilizing NASCET criteria, using the distal internal carotid diameter as the denominator. CONTRAST:  27mL GADAVIST GADOBUTROL 1 MMOL/ML IV SOLN COMPARISON:  None. FINDINGS: MRI HEAD FINDINGS Brain: Abnormal diffusion restriction within the ventral left thalamus and along the dorsolateral right thalamus. 15-20 chronic microhemorrhages in a mixed central and peripheral pattern. There is multifocal hyperintense T2-weighted signal within the white matter. Parenchymal volume and CSF spaces are normal. The midline structures are normal. Mild contrast enhancement at the site of the right thalamic infarct, suggesting that it may be slightly older than the left. Vascular: Major flow voids are preserved. Skull and upper cervical spine: Normal calvarium and skull base. Visualized upper cervical spine and soft tissues are normal. Sinuses/Orbits:No paranasal sinus fluid levels or advanced mucosal thickening. No mastoid or middle ear effusion. Normal orbits. MRA HEAD FINDINGS POSTERIOR CIRCULATION: --Vertebral arteries: Severe stenosis of the distal right V4 segment. --Inferior cerebellar arteries: Normal.  --Basilar artery: Normal. --Superior cerebellar arteries: Normal. --Posterior cerebral arteries: Atherosclerotic irregularity without stenosis. ANTERIOR CIRCULATION: --Intracranial internal carotid arteries: Normal. --Anterior cerebral arteries (ACA): Normal. --Middle cerebral arteries (MCA): Atherosclerotic irregularity with severe stenosis of the distal right MCA M1 segment. ANATOMIC VARIANTS: None MRA NECK FINDINGS No carotid stenosis or occlusion. The V1 3 segments are normal. Left dominant vertebral configuration. Normal 3 vessel aortic arch branching pattern. IMPRESSION: 1. Acute to early subacute right and likely late subacute left thalamic infarcts. No mass effect or acute hemorrhage. 2. Severe stenosis of the distal right MCA M1 segment. 3. Severe stenosis of the distal right V4 segment. 4. No stenosis of the carotid or vertebral arteries in the neck. Electronically Signed   By: Ulyses Jarred M.D.   On: 03/13/2021 02:11   MR ANGIO NECK W WO CONTRAST  Result Date: 03/13/2021 CLINICAL DATA:  Aphasia, nausea and vomiting. EXAM: MRI HEAD WITHOUT AND WITH CONTRAST MRA HEAD WITHOUT CONTRAST MRA NECK WITHOUT AND WITH CONTRAST TECHNIQUE: Multiplanar, multiecho pulse sequences of the brain and surrounding structures were obtained without and  with intravenous contrast. Angiographic images of the Circle of Willis were obtained using MRA technique without intravenous contrast. Angiographic images of the neck were obtained using MRA technique without and with intravenous contrast. Carotid stenosis measurements (when applicable) are obtained utilizing NASCET criteria, using the distal internal carotid diameter as the denominator. CONTRAST:  80mL GADAVIST GADOBUTROL 1 MMOL/ML IV SOLN COMPARISON:  None. FINDINGS: MRI HEAD FINDINGS Brain: Abnormal diffusion restriction within the ventral left thalamus and along the dorsolateral right thalamus. 15-20 chronic microhemorrhages in a mixed central and peripheral pattern.  There is multifocal hyperintense T2-weighted signal within the white matter. Parenchymal volume and CSF spaces are normal. The midline structures are normal. Mild contrast enhancement at the site of the right thalamic infarct, suggesting that it may be slightly older than the left. Vascular: Major flow voids are preserved. Skull and upper cervical spine: Normal calvarium and skull base. Visualized upper cervical spine and soft tissues are normal. Sinuses/Orbits:No paranasal sinus fluid levels or advanced mucosal thickening. No mastoid or middle ear effusion. Normal orbits. MRA HEAD FINDINGS POSTERIOR CIRCULATION: --Vertebral arteries: Severe stenosis of the distal right V4 segment. --Inferior cerebellar arteries: Normal. --Basilar artery: Normal. --Superior cerebellar arteries: Normal. --Posterior cerebral arteries: Atherosclerotic irregularity without stenosis. ANTERIOR CIRCULATION: --Intracranial internal carotid arteries: Normal. --Anterior cerebral arteries (ACA): Normal. --Middle cerebral arteries (MCA): Atherosclerotic irregularity with severe stenosis of the distal right MCA M1 segment. ANATOMIC VARIANTS: None MRA NECK FINDINGS No carotid stenosis or occlusion. The V1 3 segments are normal. Left dominant vertebral configuration. Normal 3 vessel aortic arch branching pattern. IMPRESSION: 1. Acute to early subacute right and likely late subacute left thalamic infarcts. No mass effect or acute hemorrhage. 2. Severe stenosis of the distal right MCA M1 segment. 3. Severe stenosis of the distal right V4 segment. 4. No stenosis of the carotid or vertebral arteries in the neck. Electronically Signed   By: Ulyses Jarred M.D.   On: 03/13/2021 02:11   MR Brain W and Wo Contrast  Result Date: 03/13/2021 CLINICAL DATA:  Aphasia, nausea and vomiting. EXAM: MRI HEAD WITHOUT AND WITH CONTRAST MRA HEAD WITHOUT CONTRAST MRA NECK WITHOUT AND WITH CONTRAST TECHNIQUE: Multiplanar, multiecho pulse sequences of the brain and  surrounding structures were obtained without and with intravenous contrast. Angiographic images of the Circle of Willis were obtained using MRA technique without intravenous contrast. Angiographic images of the neck were obtained using MRA technique without and with intravenous contrast. Carotid stenosis measurements (when applicable) are obtained utilizing NASCET criteria, using the distal internal carotid diameter as the denominator. CONTRAST:  40mL GADAVIST GADOBUTROL 1 MMOL/ML IV SOLN COMPARISON:  None. FINDINGS: MRI HEAD FINDINGS Brain: Abnormal diffusion restriction within the ventral left thalamus and along the dorsolateral right thalamus. 15-20 chronic microhemorrhages in a mixed central and peripheral pattern. There is multifocal hyperintense T2-weighted signal within the white matter. Parenchymal volume and CSF spaces are normal. The midline structures are normal. Mild contrast enhancement at the site of the right thalamic infarct, suggesting that it may be slightly older than the left. Vascular: Major flow voids are preserved. Skull and upper cervical spine: Normal calvarium and skull base. Visualized upper cervical spine and soft tissues are normal. Sinuses/Orbits:No paranasal sinus fluid levels or advanced mucosal thickening. No mastoid or middle ear effusion. Normal orbits. MRA HEAD FINDINGS POSTERIOR CIRCULATION: --Vertebral arteries: Severe stenosis of the distal right V4 segment. --Inferior cerebellar arteries: Normal. --Basilar artery: Normal. --Superior cerebellar arteries: Normal. --Posterior cerebral arteries: Atherosclerotic irregularity without stenosis. ANTERIOR CIRCULATION: --Intracranial internal  carotid arteries: Normal. --Anterior cerebral arteries (ACA): Normal. --Middle cerebral arteries (MCA): Atherosclerotic irregularity with severe stenosis of the distal right MCA M1 segment. ANATOMIC VARIANTS: None MRA NECK FINDINGS No carotid stenosis or occlusion. The V1 3 segments are normal. Left  dominant vertebral configuration. Normal 3 vessel aortic arch branching pattern. IMPRESSION: 1. Acute to early subacute right and likely late subacute left thalamic infarcts. No mass effect or acute hemorrhage. 2. Severe stenosis of the distal right MCA M1 segment. 3. Severe stenosis of the distal right V4 segment. 4. No stenosis of the carotid or vertebral arteries in the neck. Electronically Signed   By: Ulyses Jarred M.D.   On: 03/13/2021 02:11   EEG adult  Result Date: 03/13/2021 Derek Jack, MD     03/13/2021 12:04 AM Routine EEG Report Willistine Ferrall is a 60 y.o. female with a history of R MCA infarct with new onset aphasia who is undergoing an EEG to evaluate for seizures. Report: This EEG was acquired with electrodes placed according to the International 10-20 electrode system (including Fp1, Fp2, F3, F4, C3, C4, P3, P4, O1, O2, T3, T4, T5, T6, A1, A2, Fz, Cz, Pz). The following electrodes were missing or displaced: none. The occipital dominant rhythm was 8.5 Hz. This activity is reactive to stimulation. Drowsiness was manifested by background fragmentation; deeper stages of sleep were not identified. There was no focal slowing. There were no interictal epileptiform discharges. There were no electrographic seizures identified. There was no abnormal response to photic stimulation or hyperventilation. Impression: This EEG was obtained while awake and drowsy and is normal.   Clinical Correlation: Normal EEGs, however, do not rule out epilepsy. Su Monks, MD [facility] [phone] If 7pm- 7am, please page neurology on call as listed in [place].   [place] EKG SITE RITE  Result Date: 02/12/2021 If Site Rite image not attached, placement could not be confirmed due to current cardiac rhythm.     Subjective: Patient felt much better.  Her most recent stroke is due to small vessel disease.  After consultation with neurology we felt the patient was stable for discharge and to continue  to improve risk factors.  Patient strongly wanted to go home.  Discharge Exam: Vitals:   03/13/21 1310 03/13/21 1430  BP: 131/72 130/85  Pulse: (!) 109   Resp: (!) 23 (!) 22  Temp:  98.2 F (36.8 C)  SpO2: 97% 97%   Vitals:   03/13/21 0750 03/13/21 1100 03/13/21 1310 03/13/21 1430  BP: 140/79 135/78 131/72 130/85  Pulse: (!) 108 100 (!) 109   Resp: (!) 21 13 (!) 23 (!) 22  Temp: 98.1 F (36.7 C)   98.2 F (36.8 C)  TempSrc: Oral   Oral  SpO2: 98% 95% 97% 97%  Weight:      Height:        General: Pt is alert, awake, not in acute distress Cardiovascular: RRR, S1/S2 +, no rubs, no gallops Respiratory: CTA bilaterally, no wheezing, no rhonchi Abdominal: Soft, NT, ND, bowel sounds + Extremities: no edema, no cyanosis    The results of significant diagnostics from this hospitalization (including imaging, microbiology, ancillary and laboratory) are listed below for reference.     Microbiology: Recent Results (from the past 240 hour(s))  SARS CORONAVIRUS 2 (TAT 6-24 HRS) Nasopharyngeal Nasopharyngeal Swab     Status: None   Collection Time: 03/13/21  4:46 AM   Specimen: Nasopharyngeal Swab  Result Value Ref Range Status   SARS Coronavirus 2  NEGATIVE NEGATIVE Final    Comment: (NOTE) SARS-CoV-2 target nucleic acids are NOT DETECTED.  The SARS-CoV-2 RNA is generally detectable in upper and lower respiratory specimens during the acute phase of infection. Negative results do not preclude SARS-CoV-2 infection, do not rule out co-infections with other pathogens, and should not be used as the sole basis for treatment or other patient management decisions. Negative results must be combined with clinical observations, patient history, and epidemiological information. The expected result is Negative.  Fact Sheet for Patients: SugarRoll.be  Fact Sheet for Healthcare Providers: https://www.woods-mathews.com/  This test is not yet  approved or cleared by the [place] FDA and  has been authorized for detection and/or diagnosis of SARS-CoV-2 by FDA under an Emergency Use Authorization (EUA). This EUA will remain  in effect (meaning this test can be used) for the duration of the COVID-19 declaration under Se ction 564(b)(1) of the Act, 21 U.S.C. section 360bbb-3(b)(1), unless the authorization is terminated or revoked sooner.  Performed at [facility], [place] [address], [place], [place] [address]      Labs: Basic Metabolic Panel: Recent Labs  Lab 03/12/21 1842  NA 138  K 3.8  CL 104  CO2 26  GLUCOSE 207*  BUN 17  CREATININE 0.78  CALCIUM 9.2   Liver Function Tests: Recent Labs  Lab 03/12/21 1842  AST 18  ALT 30  ALKPHOS 60  BILITOT 0.7  PROT 6.4*  ALBUMIN 3.8   CBC: Recent Labs  Lab 03/12/21 1842  WBC 8.1  NEUTROABS 4.5  HGB 15.7*  HCT 48.0*  MCV 88.6  PLT 293   Cardiac Enzymes: Recent Labs  Lab 03/13/21 0445  CKTOTAL 27*   CBG: Recent Labs  Lab 03/13/21 0238 03/13/21 0742 03/13/21 1228  GLUCAP 181* 296* 215*   Lipid Profile Recent Labs    03/13/21 0445  CHOL 284*  HDL 34*  LDLCALC 173*  TRIG 387*  CHOLHDL 8.4   Urinalysis    Component Value Date/Time   COLORURINE YELLOW 03/12/2021 2120   APPEARANCEUR HAZY (A) 03/12/2021 2120   LABSPEC 1.021 03/12/2021 2120   PHURINE 5.0 03/12/2021 2120   GLUCOSEU >=500 (A) 03/12/2021 2120   HGBUR NEGATIVE 03/12/2021 2120   [place] NEGATIVE 03/12/2021 2120   KETONESUR NEGATIVE 03/12/2021 2120   PROTEINUR 30 (A) 03/12/2021 2120   UROBILINOGEN 0.2 12/04/2011 2352   NITRITE NEGATIVE 03/12/2021 2120   LEUKOCYTESUR SMALL (A) 03/12/2021 2120   Sepsis Labs Microbiology Recent Results (from the past 240 hour(s))  SARS CORONAVIRUS 2 (TAT 6-24 HRS) Nasopharyngeal Nasopharyngeal Swab     Status: None   Collection Time: 03/13/21  4:46 AM   Specimen: Nasopharyngeal Swab  Result Value Ref Range Status   SARS  Coronavirus 2 NEGATIVE NEGATIVE Final    Comment: (NOTE) SARS-CoV-2 target nucleic acids are NOT DETECTED.  The SARS-CoV-2 RNA is generally detectable in upper and lower respiratory specimens during the acute phase of infection. Negative results do not preclude SARS-CoV-2 infection, do not rule out co-infections with other pathogens, and should not be used as the sole basis for treatment or other patient management decisions. Negative results must be combined with clinical observations, patient history, and epidemiological information. The expected result is Negative.  Fact Sheet for Patients: SugarRoll.be  Fact Sheet for Healthcare Providers: https://www.woods-mathews.com/  This test is not yet approved or cleared by the [place] FDA and  has been authorized for detection and/or diagnosis of SARS-CoV-2 by FDA under an Emergency Use  Authorization (EUA). This EUA will remain  in effect (meaning this test can be used) for the duration of the COVID-19 declaration under Se ction 564(b)(1) of the Act, 21 U.S.C. section 360bbb-3(b)(1), unless the authorization is terminated or revoked sooner.  Performed at [facility], [place] [address], [place], [place] [address]      Time coordinating discharge: Over 30 minutes  SIGNED:   Donnamae Jude, MD  [facility] 03/13/2021, 5:31 PM  If 7PM-7AM, please contact night-coverage

## 2021-03-13 NOTE — Evaluation (Addendum)
Physical Therapy Evaluation Patient Details Name: Sheila Guerrero MRN: [id number] DOB: 1961-01-11 Today's Date: 03/13/2021   History of Present Illness  Pt with several week history of paresthesias, pain, and progressive weakness on lt side. Pt seen by Dr. Krista Blue, outpatient neurology, on 3/7 with concern for rt thalamic infarct but unable to have MRI scheduled until 3/15. Pt developed right lower extremity sciatica. Went to the ER for hip pain on 02/07/21. MRI showed small acute to early subacute infarct in the right  thalamus/posterior limb of internal capsule and small subacute left occipital lobe infarct. Discharged home on 02/12/21. Was using a cane, no longer using it anymore as of a couple days ago.  Clinical Impression  Pt reports that she normally walks without an AD, but if feeling off balance will use her cane or handheld assist to ambulate. Pt refuses to use cane in the home, stating that she prefers to furniture walk as the cane gets in her way, despite PT recommnedations. Pt demonstrates deficits in sensation, balance, and overall safety at this time. Pt experienced 2 losses of balance during dynamic gait activities, but with ability to recover from loss of balance. Pt demonstrates decreased gait speed with stepping over obstacles and when asked to increase the speed at which she is walking. Pt will benefit from acute PT to address sensory deficits and concerns for risk of fall, decreased balance, and improved overall independent functional mobility.     Follow Up Recommendations Outpatient PT (Pt reports receiving PT/OT/Speech at an outpatient clinic, prefers outpatient as they have her equipment needs.)    Equipment Recommendations  None recommended by PT    Recommendations for Other Services       Precautions / Restrictions Precautions Precautions: None Restrictions Weight Bearing Restrictions: No      Mobility  Bed Mobility                    Transfers Overall  transfer level: Independent Equipment used: None             General transfer comment: Pt sitting at EOB upon PT arrival.  Ambulation/Gait Ambulation/Gait assistance: Min guard Gait Distance (Feet): 1000 Feet Assistive device: None Gait Pattern/deviations: Step-through pattern Gait velocity: reduced Gait velocity interpretation: 1.31 - 2.62 ft/sec, indicative of limited community ambulator General Gait Details: Pt experienced 2 losses of balance during gait activities, able to recover balance and reports LOB is typical for her.  Stairs            Wheelchair Mobility    Modified Rankin (Stroke Patients Only) Modified Rankin (Stroke Patients Only) Pre-Morbid Rankin Score: Slight disability Modified Rankin: Slight disability     Balance Overall balance assessment: Mild deficits observed, not formally tested                             High Level Balance Comments: Pt able to perform dynamic gait activties with head turns, stepping over obstacles, picking object off of floor with slower speed.             Pertinent Vitals/Pain Pain Assessment: No/denies pain    Home Living Family/patient expects to be discharged to:: Private residence Living Arrangements: Spouse/significant other Available Help at Discharge: Family;Available PRN/intermittently Type of Home: House Home Access: Stairs to enter Entrance Stairs-Rails: None Entrance Stairs-Number of Steps: 2 Home Layout: One level Home Equipment: Cane - single point      Prior Function Level of  Independence: Independent with assistive device(s)         Comments: Uses cane or hand held support outside of the home. Pt reports furniture walking when at home.     Hand Dominance        Extremity/Trunk Assessment   Upper Extremity Assessment Upper Extremity Assessment: LUE deficits/detail LUE Deficits / Details: Pt demonstrates decreased sensation. Reports tingling of L UE. LUE Sensation:  decreased light touch LUE Coordination: WNL    Lower Extremity Assessment Lower Extremity Assessment: LLE deficits/detail LLE Deficits / Details: Pt reports decreased sensation of L foot to noxious stimuli. LLE Sensation: decreased light touch LLE Coordination: WNL (Pt demonstrated mild impairments with L heel to R shin, but was able to improve when given VC.)    Cervical / Trunk Assessment Cervical / Trunk Assessment: Normal  Communication   Communication: No difficulties  Cognition   Behavior During Therapy: WFL for tasks assessed/performed Overall Cognitive Status: Within Functional Limits for tasks assessed                                        General Comments General comments (skin integrity, edema, etc.): Pt demonstrates decreased light tough sensation to L UE and LE and mild balance deficits.    Exercises     Assessment/Plan    PT Assessment Patient needs continued PT services  PT Problem List Decreased balance;Decreased safety awareness;Impaired sensation       PT Treatment Interventions DME instruction;Functional mobility training;Therapeutic exercise;Balance training;Neuromuscular re-education;Patient/family education    PT Goals (Current goals can be found in the Care Plan section)  Acute Rehab PT Goals Patient Stated Goal: To go home PT Goal Formulation: With patient Time For Goal Achievement: 03/27/21 Potential to Achieve Goals: Good Additional Goals Additional Goal #1: Pt will ambulate > 1000 feet with least restrictive device without experiencing a LOB.    Frequency Min 4X/week   Barriers to discharge        Co-evaluation               AM-PAC PT "6 Clicks" Mobility  Outcome Measure Help needed turning from your back to your side while in a flat bed without using bedrails?: None Help needed moving from lying on your back to sitting on the side of a flat bed without using bedrails?: None Help needed moving to and from a bed  to a chair (including a wheelchair)?: None Help needed standing up from a chair using your arms (e.g., wheelchair or bedside chair)?: None Help needed to walk in hospital room?: A Little Help needed climbing 3-5 steps with a railing? : A Little 6 Click Score: 22    End of Session Equipment Utilized During Treatment: Gait belt Activity Tolerance: Patient tolerated treatment well Patient left: in bed;with call bell/phone within reach;with family/visitor present   PT Visit Diagnosis: Other abnormalities of gait and mobility (R26.89);Ataxic gait (R26.0)    Time: 6606-3016 PT Time Calculation (min) (ACUTE ONLY): 26 min   Charges:    PT Evaluation $PT Eval Low Complexity: 1 Low          Acute Rehab  Pager: [phone]   Garwin Brothers, SPT 03/13/2021, 11:16 AM

## 2021-03-13 NOTE — ED Notes (Signed)
Returned from MRI at this time.

## 2021-03-13 NOTE — ED Notes (Signed)
Provider at bedside preparing patient for discharge. Provider notified of 4 point drop in hgb and of repeat CBC order. Received verbal order from provider not to draw repeat CBC for hgb verification.

## 2021-03-13 NOTE — Consult Note (Signed)
NEUROLOGY CONSULTATION NOTE   Date of service: March 13, 2021 Patient Name: Sheila Guerrero MRN:  [id number] DOB:  26-Oct-1961 Reason for consult: acute onset aphasia _ _ _   _ __   _ __ _ _  __ __   _ __   __ _  History of Present Illness   Simone Rodenbeck is a 60 y.o. female with complicated recent past medical history including culture-negative endocarditis, subacute MCA stroke, diabetes, hypertension, currently on outpatient daptomycin and Rocephin through PICC line presents to the emergency department with vomiting and somnolence today along with increased gait instability, word finding difficulty, and fatigue. Neurology is consulted for evaluation of word finding difficulty.  On examination patient unable to provide meaningful hx 2/2 expressive aphasia (word salad). Husband reports this is new onset since this morning 12+ hrs and she has had acute exacerbation of her baseline LUE incoordination she developed in Mar with prior infarct. Pt denies worsening weakness. She has developed thalamic pain syndrome since her stroke last month and reports shooting burning pain LUE and LLE. She is on pregabalin and tramadol for this.   CT head 1. Generalized cerebral atrophy. 2. Chronic bilateral basal ganglia lacunar infarcts. 3. No acute intracranial abnormality.  Routine EEG interpreted by me in ED (while aphasic) normal. MRI brain pending.  Regarding recent hospitalization per d/c summary 02/12/21:  Acute/subacute right MCA territory stroke ischemic: Presented with left-sided sensory deficit with mild ataxia that is mostly improving. MRI of the brain showed small acute right thalamus and posterior limb of internal capsule infarct, left occipital lobe infarct. MRA of the head showed severe right M1 and moderate right V4 stenosis.  No large vessel occlusion. TTE showed ejection fraction 40 to 45% with global hypokinesis.  Moderate concentric LVH.  No source of embolism.  Carotid duplexes  with no significant stenosis. TEE showed vegetations in mitral and tricuspid valve.  2 subsequent blood cultures negative. Plan, As per neurology recommendation aspirin 81 mg daily forever [2/2 ?hx angioedema to ASA in the past], Plavix for 3 weeks and stop.  She is already on Plavix, will complete 18 more days. LDL is 55 and on goal.  She is on atorvastatin 20 mg daily.  Going home on daptomycin.  She will hold taking Lipitor until she is on daptomycin to avoid risk of rhabdomyolysis. Seen by therapies.  Recommended outpatient rehab. Outpatient neurology follow-up.  Mitral valve/tricuspid valve vegetation: Accidentally found both right and left-sided vegetation on TEE exam.  Patient with some chronic weakness and fatigue but no fever, recent infection or intervention.  She does go to chiropractor but no invasive procedure done. With new onset pain, MRI of the pelvis that included both joints done did not show any evidence of infection. MRI of the lumbar spine with no evidence of infection. Panorex with no evidence of dental infection. 2 sets of blood cultures on 3/15, 2 sets of blood cultures on 3/16 no growth so far. Treating as culture-negative bacterial endocarditis as per infectious disease recommendation, PICC line placed and will be treated at home with home infusion therapy with daptomycin and Rocephin for 6 weeks.  Will need frequent renal function, liver functions and creatinine kinase monitoring, instructions provided to home health infusion therapy.    ROS   10 point review of systems was performed and was negative except as described in HPI.  Past History   Past Medical History:  Diagnosis Date  . Angioedema   . Anxiety   . Asthma   .  Depression   . Diabetes mellitus, type II ([place])   . Dyslipidemia   . Family history of adverse reaction to anesthesia    mother severely confused after general anesthesia  . History of CVA (cerebrovascular accident)   . HTN (hypertension)  2013  . Panic attacks   . Pneumonia   . Septic embolism ([place]) 02/10/2021  . Situational stress   . Subacute endocarditis 02/10/2021   Past Surgical History:  Procedure Laterality Date  . BUBBLE STUDY  02/10/2021   Procedure: BUBBLE STUDY;  Surgeon: Dixie Dials, MD;  Location: [facility];  Service: Cardiovascular;;  . CHOLECYSTECTOMY  2010  . TEE WITHOUT CARDIOVERSION N/A 02/10/2021   Procedure: TRANSESOPHAGEAL ECHOCARDIOGRAM (TEE);  Surgeon: Dixie Dials, MD;  Location: [facility] ENDOSCOPY;  Service: Cardiovascular;  Laterality: N/A;   Family History  Problem Relation Age of Onset  . Hypertension Mother   . Other Mother        blood disorder  . Hypertension Father   . Stroke Father   . Diabetes Maternal Aunt    Social History   Socioeconomic History  . Marital status: Married    Spouse name: Alveta Heimlich  . Number of children: 0  . Years of education: Bachelors  . Highest education level: Not on file  Occupational History  . Occupation: Homemaker now  Tobacco Use  . Smoking status: Never Smoker  . Smokeless tobacco: Never Used  Substance and Sexual Activity  . Alcohol use: Yes    Comment: rarely  . Drug use: Never  . Sexual activity: Yes    Birth control/protection: None  Other Topics Concern  . Not on file  Social History Narrative   Lives at home with husband.   Right-handed.   No daily use of caffeine.   Social Determinants of Health   Financial Resource Strain: Not on file  Food Insecurity: Not on file  Transportation Needs: Not on file  Physical Activity: Not on file  Stress: Not on file  Social Connections: Not on file   Allergies  Allergen Reactions  . Sulfa Antibiotics Swelling  . Dulaglutide     Other reaction(s): rash  . Empagliflozin     Other reaction(s): recurrent yeast  . Iodinated Diagnostic Agents     Other reaction(s): angioedema with CT dye  . Latex Rash    Medications   (Not in a hospital admission)    Vitals   Vitals:   03/12/21  2045 03/12/21 2100 03/12/21 2145 03/12/21 2230  BP: (!) 149/97 (!) 169/101 (!) 159/92 (!) 156/98  Pulse: 97 (!) 101 100 (!) 104  Resp: (!) 21 18    Temp:      TempSrc:      SpO2: 100% 100% 98% 98%  Weight:      Height:         Body mass index is 29.62 kg/m.  Physical Exam   Physical Exam Gen: alert, oriented to self and husband, difficulty answering orientation questions 2/2 expressive aphasia HEENT: Atraumatic, normocephalic;mucous membranes moist; oropharynx clear, tongue without atrophy or fasciculations. Neck: Supple, trachea midline. Resp: CTAB, no w/r/r CV: RRR, no m/g/r; nml S1 and S2. 2+ symmetric peripheral pulses. Abd: soft/NT/ND; nabs x 4 quad Extrem: Nml bulk; no cyanosis, clubbing, or edema.  Neuro: *MS: alert, oriented to self and husband, difficulty answering orientation questions 2/2 expressive aphasia *Speech: fluid, mild dysarthria *CN:    I: Deferred   II,III: PERRLA, VFF by confrontation, optic discs sharp   III,IV,VI: EOMI w/o nystagmus,  no ptosis   V: Sensation intact from V1 to V3 to LT   VII: Eyelid closure was full.  L UMN facial droop   VIII: Hearing intact to voice   IX,X: Voice normal, palate elevates symmetrically    XI: SCM/trap 5/5 bilat   XII: Tongue protrudes midline, no atrophy or fasciculations   *Motor:   Normal bulk.  No tremor, rigidity or bradykinesia. No pronator drift.    Strength: Dlt Bic Tri WrE WrF FgS Gr HF KnF KnE PlF DoF    Left 5 5 5 5 5 5  4+ 5 5 5 5 5     Right 5 5 5 5 5 5 5 5 5 5 5 5    *Sensory: Intact to light touch, pinprick, temperature vibration throughout. Symmetric. Propioception intact bilat.  Extinction to DSS LUE and LLE *Coordination:  ataxia FNF on L only *Reflexes:  2+ more brisk on L without clonus; toes down-going bilat *Gait: deferred  NIHSS = 6 (2 questions, 1 facial palsy, 1 ataxia, 1 best language, 1 extinction)   Labs   CBC:  Recent Labs  Lab 03/12/21 1842  WBC 8.1  NEUTROABS 4.5  HGB  15.7*  HCT 48.0*  MCV 88.6  PLT 935    Basic Metabolic Panel:  Lab Results  Component Value Date   NA 138 03/12/2021   K 3.8 03/12/2021   CO2 26 03/12/2021   GLUCOSE 207 (H) 03/12/2021   BUN 17 03/12/2021   CREATININE 0.78 03/12/2021   CALCIUM 9.2 03/12/2021   GFRNONAA >60 03/12/2021   GFRAA 117 02/16/2017   Lipid Panel:  Lab Results  Component Value Date   LDLCALC 55 02/08/2021   HgbA1c:  Lab Results  Component Value Date   HGBA1C 12.1 (H) 02/09/2021   Urine Drug Screen:     Component Value Date/Time   LABOPIA NONE DETECTED 03/12/2021 2119   COCAINSCRNUR NONE DETECTED 03/12/2021 2119   LABBENZ NONE DETECTED 03/12/2021 2119   AMPHETMU NONE DETECTED 03/12/2021 2119   THCU NONE DETECTED 03/12/2021 2119   LABBARB NONE DETECTED 03/12/2021 2119    Alcohol Level     Component Value Date/Time   [facility] <10 03/12/2021 2039     Impression   Anthony Lopata is a 60 y.o. female with complicated recent past medical history including culture-negative endocarditis, subacute MCA stroke, diabetes, hypertension, currently on outpatient daptomycin and Rocephin through PICC line presents to the emergency department with vomiting and somnolence today along with increased gait instability, word finding difficulty, and fatigue. EEG while patient symptomatic was normal and rules out seizure. Suspect recurrent ischemic stroke. I don't think this is recrudescence bc she has never had aphasia before.   Recommendations   - Admit to hospitalist service for stroke w/u; stroke team will consult - Permissive HTN x48 hrs from sx onset or until stroke ruled out by MRI goal BP <220/110. PRN labetalol or hydralazine if BP above these parameters. Avoid oral antihypertensives. - Consider infectious w/u given lethargy reported earlier today (not lethargic on my exam) - MRI brain wwo contrast (wwo 2/2 recent hx endocarditis c/b septic emboli) - MRA H&N - TTE w/ bubble - Check A1c and LDL + add  statin per guidelines - Continue clopidogrel 75mg  daily. No ASA 2/2 ?hx angioedema with it - Continue lyrica 150mg  bid for thalamic pain syndrome - OK to restart home tramadol 50mg  bid given normal EEG - q4 hr neuro checks - STAT head CT for any change in neuro exam - Tele -  SLP - Stroke education - F/u with outpatient neurologist after discharge  Stroke team will continue to follow.  ______________________________________________________________________   Thank you for the opportunity to take part in the care of this patient. If you have any further questions, please contact the neurology consultation attending.  Signed,  Su Monks, MD [facility] [phone]  If 7pm- 7am, please page neurology on call as listed in [place].

## 2021-03-13 NOTE — ED Notes (Signed)
Provider at bedside per spouse request

## 2021-03-17 ENCOUNTER — Telehealth: Payer: Self-pay | Admitting: Neurology

## 2021-03-17 ENCOUNTER — Other Ambulatory Visit: Payer: Self-pay | Admitting: Neurology

## 2021-03-17 LAB — LUPUS ANTICOAGULANT
Dilute Viper Venom Time: 31.6 s (ref 0.0–47.0)
PTT Lupus Anticoagulant: 30.1 s (ref 0.0–51.9)
Thrombin Time: 19.6 s (ref 0.0–23.0)
dPT Confirm Ratio: 1.01 Ratio (ref 0.00–1.34)
dPT: 33.3 s (ref 0.0–47.6)

## 2021-03-17 LAB — SEDIMENTATION RATE: Sed Rate: 5 mm/hr (ref 0–40)

## 2021-03-17 LAB — MTHFR DNA ANALYSIS

## 2021-03-17 LAB — ANA W/REFLEX: Anti Nuclear Antibody (ANA): NEGATIVE

## 2021-03-17 LAB — RHEUMATOID FACTOR: Rheumatoid fact SerPl-aCnc: <10 IU/mL (ref <14.0)

## 2021-03-17 LAB — METHYLMALONIC ACID, SERUM: Methylmalonic Acid: 280 nmol/L (ref 0–378)

## 2021-03-17 MED ORDER — L-METHYLFOLATE-B6-B12 3-35-2 MG PO TABS: 1.0000 | ORAL_TABLET | Freq: Every day | ORAL | 3 refills | Status: DC

## 2021-03-17 NOTE — Telephone Encounter (Signed)
FYI: Pt's husband, Glessie Eustice (on [name]) called, she had a stroke this past weekend (Friday). Stroke was on the left side of her brain. Speech was slightly off, very tired. She is back on Plavix.

## 2021-03-17 NOTE — Telephone Encounter (Signed)
I called the patient.  I talked to the husband.  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  I am not sure this has anything to do with her stroke, but it is worthwhile treating at this point.  Patient now is back on aspirin and Plavix.  Apparently she has had a lot of fatigue issues, this is very common after even a small stroke.

## 2021-03-18 ENCOUNTER — Telehealth: Payer: Self-pay

## 2021-03-18 LAB — CULTURE, BLOOD (ROUTINE X 2)
Culture: NO GROWTH
Culture: NO GROWTH
Special Requests: ADEQUATE
Special Requests: ADEQUATE

## 2021-03-18 NOTE — Telephone Encounter (Signed)
Amy with [facility] is calling in regards to patient IV antibiotics. Per Amy their notes they have the patient completing IV antibiotics on 03/18/21 and picc can be pulled after last dose. Patient was recently in the hospital due to a stroke and discharged on 03/12/21. Amy is asking is it still ok to end antibiotics on 03/18/21 and pull picc. Please advise Carmine Youngberg T Brooks Sailors

## 2021-03-18 NOTE — Telephone Encounter (Signed)
Sheila Guerrero with [facility] advised per Dr. Tommy Medal ok to stop antibiotics and picc removed after last dose as planned. Sheila Guerrero verbalized understanding.  Jashawna Reever T Brooks Sailors

## 2021-03-20 ENCOUNTER — Ambulatory Visit: Payer: 59

## 2021-03-20 ENCOUNTER — Ambulatory Visit: Payer: 59 | Admitting: Physical Therapy

## 2021-03-20 ENCOUNTER — Ambulatory Visit: Payer: 59 | Admitting: Occupational Therapy

## 2021-03-20 ENCOUNTER — Encounter: Payer: Self-pay | Admitting: Physical Medicine & Rehabilitation

## 2021-03-25 ENCOUNTER — Other Ambulatory Visit: Payer: Self-pay | Admitting: Cardiovascular Disease

## 2021-03-25 ENCOUNTER — Other Ambulatory Visit (HOSPITAL_COMMUNITY)
Admission: RE | Admit: 2021-03-25 | Discharge: 2021-03-25 | Disposition: A | Discharge status: Home / Self Care | Payer: 59 | Source: Ambulatory Visit | Attending: Cardiovascular Disease | Admitting: Cardiovascular Disease

## 2021-03-25 DIAGNOSIS — Z20822 Contact with and (suspected) exposure to covid-19: Secondary | ICD-10-CM | POA: Insufficient documentation

## 2021-03-25 DIAGNOSIS — Z01812 Encounter for preprocedural laboratory examination: Secondary | ICD-10-CM | POA: Diagnosis not present

## 2021-03-25 LAB — SARS CORONAVIRUS 2 (TAT 6-24 HRS): SARS Coronavirus 2: NEGATIVE

## 2021-03-26 ENCOUNTER — Other Ambulatory Visit: Payer: Self-pay | Admitting: Cardiovascular Disease

## 2021-03-27 ENCOUNTER — Encounter (HOSPITAL_COMMUNITY)
Admission: RE | Disposition: A | Discharge status: Home / Self Care | Payer: Self-pay | Source: Home / Self Care | Attending: Cardiovascular Disease

## 2021-03-27 ENCOUNTER — Ambulatory Visit (HOSPITAL_COMMUNITY)
Admission: RE | Admit: 2021-03-27 | Discharge: 2021-03-27 | Disposition: A | Discharge status: Home / Self Care | Payer: 59 | Attending: Cardiovascular Disease | Admitting: Cardiovascular Disease

## 2021-03-27 ENCOUNTER — Other Ambulatory Visit: Payer: Self-pay

## 2021-03-27 ENCOUNTER — Ambulatory Visit (HOSPITAL_COMMUNITY)
Admission: RE | Admit: 2021-03-27 | Discharge: 2021-03-27 | Disposition: A | Discharge status: Home / Self Care | Payer: 59 | Source: Home / Self Care | Attending: Cardiovascular Disease | Admitting: Cardiovascular Disease

## 2021-03-27 ENCOUNTER — Ambulatory Visit: Payer: 59 | Admitting: Occupational Therapy

## 2021-03-27 ENCOUNTER — Ambulatory Visit (HOSPITAL_COMMUNITY): Payer: 59 | Admitting: Certified Registered"

## 2021-03-27 ENCOUNTER — Ambulatory Visit: Payer: 59

## 2021-03-27 ENCOUNTER — Encounter (HOSPITAL_COMMUNITY): Payer: Self-pay | Admitting: Cardiovascular Disease

## 2021-03-27 DIAGNOSIS — I081 Rheumatic disorders of both mitral and tricuspid valves: Secondary | ICD-10-CM | POA: Insufficient documentation

## 2021-03-27 DIAGNOSIS — I330 Acute and subacute infective endocarditis: Secondary | ICD-10-CM | POA: Diagnosis present

## 2021-03-27 DIAGNOSIS — J45909 Unspecified asthma, uncomplicated: Secondary | ICD-10-CM | POA: Insufficient documentation

## 2021-03-27 DIAGNOSIS — Z888 Allergy status to other drugs, medicaments and biological substances status: Secondary | ICD-10-CM | POA: Diagnosis not present

## 2021-03-27 DIAGNOSIS — Z882 Allergy status to sulfonamides status: Secondary | ICD-10-CM | POA: Diagnosis not present

## 2021-03-27 DIAGNOSIS — Z794 Long term (current) use of insulin: Secondary | ICD-10-CM | POA: Diagnosis not present

## 2021-03-27 DIAGNOSIS — Z823 Family history of stroke: Secondary | ICD-10-CM | POA: Diagnosis not present

## 2021-03-27 DIAGNOSIS — Z91041 Radiographic dye allergy status: Secondary | ICD-10-CM | POA: Insufficient documentation

## 2021-03-27 DIAGNOSIS — I700 Atherosclerosis of aorta: Secondary | ICD-10-CM | POA: Insufficient documentation

## 2021-03-27 DIAGNOSIS — E785 Hyperlipidemia, unspecified: Secondary | ICD-10-CM | POA: Diagnosis not present

## 2021-03-27 DIAGNOSIS — Z8673 Personal history of transient ischemic attack (TIA), and cerebral infarction without residual deficits: Secondary | ICD-10-CM | POA: Insufficient documentation

## 2021-03-27 DIAGNOSIS — Z79899 Other long term (current) drug therapy: Secondary | ICD-10-CM | POA: Insufficient documentation

## 2021-03-27 DIAGNOSIS — E119 Type 2 diabetes mellitus without complications: Secondary | ICD-10-CM | POA: Diagnosis not present

## 2021-03-27 DIAGNOSIS — I10 Essential (primary) hypertension: Secondary | ICD-10-CM | POA: Diagnosis not present

## 2021-03-27 DIAGNOSIS — Z7982 Long term (current) use of aspirin: Secondary | ICD-10-CM | POA: Insufficient documentation

## 2021-03-27 DIAGNOSIS — Z91040 Latex allergy status: Secondary | ICD-10-CM | POA: Insufficient documentation

## 2021-03-27 DIAGNOSIS — Z8249 Family history of ischemic heart disease and other diseases of the circulatory system: Secondary | ICD-10-CM | POA: Diagnosis not present

## 2021-03-27 DIAGNOSIS — Z833 Family history of diabetes mellitus: Secondary | ICD-10-CM | POA: Diagnosis not present

## 2021-03-27 HISTORY — PX: TEE WITHOUT CARDIOVERSION: SHX5443

## 2021-03-27 LAB — GLUCOSE, CAPILLARY: Glucose-Capillary: 117 mg/dL — ABNORMAL HIGH (ref 70–99)

## 2021-03-27 SURGERY — ECHOCARDIOGRAM, TRANSESOPHAGEAL
Anesthesia: Monitor Anesthesia Care

## 2021-03-27 MED ORDER — SODIUM CHLORIDE 0.9 % IV SOLN: INTRAVENOUS | Status: DC | PRN

## 2021-03-27 MED ORDER — SODIUM CHLORIDE 0.9 % IV SOLN: INTRAVENOUS | Status: DC

## 2021-03-27 MED ORDER — PROPOFOL 500 MG/50ML IV EMUL
INTRAVENOUS | Status: DC | PRN
  Administered 2021-03-27: 100 ug/kg/min via INTRAVENOUS

## 2021-03-27 MED ORDER — PROPOFOL 10 MG/ML IV BOLUS
INTRAVENOUS | Status: DC | PRN
  Administered 2021-03-27: 40 ug/kg/min via INTRAVENOUS

## 2021-03-27 MED ORDER — BUTAMBEN-TETRACAINE-BENZOCAINE 2-2-14 % EX AERO
INHALATION_SPRAY | CUTANEOUS | Status: DC | PRN
  Administered 2021-03-27: 2 via TOPICAL

## 2021-03-27 MED ORDER — LABETALOL HCL 5 MG/ML IV SOLN
10.0000 mg | Freq: Once | INTRAVENOUS | Status: AC
  Administered 2021-03-27: 10 mg via INTRAVENOUS

## 2021-03-27 NOTE — Discharge Instructions (Signed)
TEE  YOU HAD AN CARDIAC PROCEDURE TODAY: Refer to the procedure report and other information in the discharge instructions given to you for any specific questions about what was found during the examination. If this information does not answer your questions, please call Dr. Doylene Canard to clarify.   DIET: Your first meal following the procedure should be a light meal and then it is ok to progress to your normal diet. A half-sandwich or bowl of soup is an example of a good first meal. Heavy or fried foods are harder to digest and may make you feel nauseous or bloated. Drink plenty of fluids but you should avoid alcoholic beverages for 24 hours. If you had a esophageal dilation, please see attached instructions for diet.   ACTIVITY: Your care partner should take you home directly after the procedure. You should plan to take it easy, moving slowly for the rest of the day. You can resume normal activity the day after the procedure however YOU SHOULD NOT DRIVE, use power tools, machinery or perform tasks that involve climbing or major physical exertion for 24 hours (because of the sedation medicines used during the test).   SYMPTOMS TO REPORT IMMEDIATELY: A cardiologist can be reached at any hour. Please call Dr. Doylene Canard for any of the following symptoms:  Vomiting of blood or coffee ground material  New, significant abdominal pain  New, significant chest pain or pain under the shoulder blades  Painful or persistently difficult swallowing  New shortness of breath  Black, tarry-looking or red, bloody stools  FOLLOW UP:  Please also call with any specific questions about appointments or follow up tests.

## 2021-03-27 NOTE — Progress Notes (Signed)
  Echocardiogram 2D Echocardiogram color, spectal, and 3D has been performed.  Sheila Guerrero M 03/27/2021, 11:47 AM

## 2021-03-27 NOTE — Anesthesia Preprocedure Evaluation (Signed)
Anesthesia Evaluation  Patient identified by MRN, date of birth, ID band Patient awake    Reviewed: Allergy & Precautions, NPO status , Patient's Chart, lab work & pertinent test results, Unable to perform ROS - Chart review only  Airway Mallampati: II  TM Distance: >3 FB Neck ROM: Full    Dental  (+) Teeth Intact, Dental Advisory Given   Pulmonary asthma ,    Pulmonary exam normal breath sounds clear to auscultation       Cardiovascular hypertension, Pt. on medications  Rhythm:Regular Rate:Tachycardia  Echo 01/2021 1. Left ventricular ejection fraction, by estimation, is 40 to 45%. The left ventricle has mildly decreased function. The left ventricle demonstrates global hypokinesis. There is moderate concentric left ventricular hypertrophy. Indeterminate diastolic filling due to E-A fusion.  2. Right ventricular systolic function is normal. The right ventricular size is normal. Tricuspid regurgitation signal is inadequate for assessing PA pressure.  3. The mitral valve is grossly normal. Trivial mitral valve  regurgitation. No evidence of mitral stenosis.  4. The aortic valve is tricuspid. Aortic valve regurgitation is not visualized. No aortic stenosis is present.  5. The inferior vena cava is normal in size with greater than 50% respiratory variability, suggesting right atrial pressure of 3 mmHg.    Neuro/Psych PSYCHIATRIC DISORDERS Anxiety Depression CVA, Residual Symptoms    GI/Hepatic negative GI ROS, Neg liver ROS,   Endo/Other  diabetes, Type 2, Oral Hypoglycemic Agents  Renal/GU negative Renal ROS     Musculoskeletal negative musculoskeletal ROS (+)   Abdominal   Peds  Hematology negative hematology ROS (+)   Anesthesia Other Findings   Reproductive/Obstetrics                             Anesthesia Physical  Anesthesia Plan  ASA: III  Anesthesia Plan: MAC   Post-op Pain  Management:    Induction: Intravenous  PONV Risk Score and Plan: 2 and Propofol infusion, Treatment may vary due to age or medical condition and TIVA  Airway Management Planned:   Additional Equipment:   Intra-op Plan:   Post-operative Plan:   Informed Consent: I have reviewed the patients History and Physical, chart, labs and discussed the procedure including the risks, benefits and alternatives for the proposed anesthesia with the patient or authorized representative who has indicated his/her understanding and acceptance.     Dental advisory given  Plan Discussed with: CRNA  Anesthesia Plan Comments:         Anesthesia Quick Evaluation

## 2021-03-27 NOTE — Anesthesia Postprocedure Evaluation (Signed)
Anesthesia Post Note  Patient: Sheila Guerrero  Procedure(s) Performed: TRANSESOPHAGEAL ECHOCARDIOGRAM (TEE) (N/A )     Patient location during evaluation: Endoscopy Anesthesia Type: MAC Level of consciousness: awake and alert Pain management: pain level controlled Vital Signs Assessment: post-procedure vital signs reviewed and stable Respiratory status: spontaneous breathing, nonlabored ventilation and respiratory function stable Cardiovascular status: blood pressure returned to baseline and stable Postop Assessment: no apparent nausea or vomiting Anesthetic complications: no   No complications documented.  Last Vitals:  Vitals:   03/27/21 1026 03/27/21 1126  BP: (!) 178/103 96/61  Pulse: 96 95  Resp: 13 20  Temp:  36.9 C  SpO2: 98% 92%    Last Pain:  Vitals:   03/27/21 1126  TempSrc: Axillary  PainSc: Asleep                 Lynda Rainwater

## 2021-03-27 NOTE — H&P (Signed)
Referring Physician: Dr. Chaney Malling JOELLE Sheila Guerrero is an 60 y.o. female.                       Chief Complaint: Recent endocarditis  HPI: 60 years old female with recent stroke and MV, TV endocarditis is here for TEE post IV antibiotic treatment. She has PMH of Asthma, Depression, type 2 DM, HTN and HLD. She denies chest pain, fever or chills.  Past Medical History:  Diagnosis Date  . Angioedema   . Anxiety   . Asthma   . Depression   . Diabetes mellitus, type II ([place])   . Dyslipidemia   . Family history of adverse reaction to anesthesia    mother severely confused after general anesthesia  . History of CVA (cerebrovascular accident)   . HTN (hypertension) 2013  . Panic attacks   . Pneumonia   . Septic embolism ([place]) 02/10/2021  . Situational stress   . Subacute endocarditis 02/10/2021      Past Surgical History:  Procedure Laterality Date  . BUBBLE STUDY  02/10/2021   Procedure: BUBBLE STUDY;  Surgeon: Dixie Dials, MD;  Location: [facility];  Service: Cardiovascular;;  . CHOLECYSTECTOMY  2010  . TEE WITHOUT CARDIOVERSION N/A 02/10/2021   Procedure: TRANSESOPHAGEAL ECHOCARDIOGRAM (TEE);  Surgeon: Dixie Dials, MD;  Location: [facility] ENDOSCOPY;  Service: Cardiovascular;  Laterality: N/A;    Family History  Problem Relation Age of Onset  . Hypertension Mother   . Other Mother        blood disorder  . Hypertension Father   . Stroke Father   . Diabetes Maternal Aunt    Social History:  reports that she has never smoked. She has never used smokeless tobacco. She reports current alcohol use. She reports that she does not use drugs.  Allergies:  Allergies  Allergen Reactions  . Sulfa Antibiotics Swelling  . Dulaglutide     Other reaction(s): rash  . Empagliflozin     Other reaction(s): recurrent yeast  . Iodinated Diagnostic Agents     Other reaction(s): angioedema with CT dye  . Latex Rash    Medications Prior to Admission  Medication Sig Dispense Refill  .  acetaminophen (TYLENOL) 500 MG tablet Take 1,000 mg by mouth every 6 (six) hours as needed for moderate pain.    Marland Kitchen aspirin EC 81 MG EC tablet Take 1 tablet (81 mg total) by mouth daily. Swallow whole. 30 tablet 11  . atorvastatin (LIPITOR) 80 MG tablet Take 1 tablet (80 mg total) by mouth daily. 30 tablet 11  . carvedilol (COREG) 6.25 MG tablet Take 6.25 mg by mouth 2 (two) times daily with a meal.    . clopidogrel (PLAVIX) 75 MG tablet Take 1 tablet (75 mg total) by mouth daily for 21 days. 21 tablet 0  . EPINEPHrine 0.3 mg/0.3 mL IJ SOAJ injection Inject 0.3 mg into the muscle daily as needed for anaphylaxis.    Marland Kitchen ezetimibe (ZETIA) 10 MG tablet Take 1 tablet (10 mg total) by mouth daily for 10 days. 10 tablet 0  . Insulin Pen Needle (PEN NEEDLES) 29G X 12MM MISC 1 each by Does not apply route daily. 30 each 2  . l-methylfolate-B6-B12 (METANX) 3-35-2 MG TABS tablet Take 1 tablet by mouth daily. 90 tablet 3  . LANTUS SOLOSTAR 100 UNIT/ML Solostar Pen Inject 20 Units into the skin at bedtime.    . metFORMIN (GLUCOPHAGE-XR) 500 MG 24 hr tablet Take 2,000 mg by mouth  daily.    . pregabalin (LYRICA) 75 MG capsule Take 150 mg by mouth 2 (two) times daily.    . sacubitril-valsartan (ENTRESTO) 24-26 MG Take 1 tablet by mouth 2 (two) times daily. 180 tablet 0  . traMADol (ULTRAM) 50 MG tablet Take 50 mg by mouth 2 (two) times daily as needed for moderate pain.      Results for orders placed or performed during the hospital encounter of 03/25/21 (from the past 48 hour(s))  SARS CORONAVIRUS 2 (TAT 6-24 HRS) Nasopharyngeal Nasopharyngeal Swab     Status: None   Collection Time: 03/25/21  1:39 PM   Specimen: Nasopharyngeal Swab  Result Value Ref Range   SARS Coronavirus 2 NEGATIVE NEGATIVE    Comment: (NOTE) SARS-CoV-2 target nucleic acids are NOT DETECTED.  The SARS-CoV-2 RNA is generally detectable in upper and lower respiratory specimens during the acute phase of infection. Negative results do not  preclude SARS-CoV-2 infection, do not rule out co-infections with other pathogens, and should not be used as the sole basis for treatment or other patient management decisions. Negative results must be combined with clinical observations, patient history, and epidemiological information. The expected result is Negative.  Fact Sheet for Patients: SugarRoll.be  Fact Sheet for Healthcare Providers: https://www.woods-mathews.com/  This test is not yet approved or cleared by the [place] FDA and  has been authorized for detection and/or diagnosis of SARS-CoV-2 by FDA under an Emergency Use Authorization (EUA). This EUA will remain  in effect (meaning this test can be used) for the duration of the COVID-19 declaration under Se ction 564(b)(1) of the Act, 21 U.S.C. section 360bbb-3(b)(1), unless the authorization is terminated or revoked sooner.  Performed at [facility], [place] [address] [place], [place] [address]    No results found.  Review Of Systems Constitutional: No fever, chills, weight loss or gain. Eyes: No vision change, wears glasses. No discharge or pain. Ears: No hearing loss, No tinnitus. Respiratory: H/O asthma, no COPD, pneumonias. No shortness of breath. No hemoptysis. Cardiovascular: No chest pain, palpitation, leg edema. Gastrointestinal: No nausea, vomiting, diarrhea, constipation. No GI bleed. No hepatitis. Genitourinary: No dysuria, hematuria, kidney stone. No incontinance. Neurological: No headache, stroke, seizures.  Psychiatry: No psych facility admission but positive for anxiety, depression, no suicide. No detox. Skin: No rash. Musculoskeletal: Positive joint pain,no  fibromyalgia. No neck pain, back pain. Lymphadenopathy: No lymphadenopathy. Hematology: No anemia or easy bruising.   Last menstrual period 11/25/2011. There is no height or weight on file to calculate BMI. General appearance: alert,  cooperative, appears stated age and no distress Head: Normocephalic, atraumatic. Eyes: Hazel eyes, pink conjunctiva, corneas clear.  Neck: No adenopathy, no carotid bruit, no JVD, supple, symmetrical, trachea midline and thyroid not enlarged. Resp: Clear to auscultation bilaterally. Cardio: Regular rate and rhythm, S1, S2 normal, II/VI systolic murmur, no click, rub or gallop GI: Soft, non-tender; bowel sounds normal; no organomegaly. Extremities: No edema, cyanosis or clubbing. Skin: Warm and dry.  Neurologic: Alert and oriented X 3, normal strength. Mild left sided weakness.  Assessment/Plan MV and TV endocarditis Recent stroke HTN HLD Type 2 DM  TEE today post IV antibiotic treatment.  Time spent: Review of old records, Lab, x-rays, EKG, other cardiac tests, examination, discussion with patient/Nurse over 40 minutes.  Birdie Riddle, MD  03/27/2021, 10:14 AM

## 2021-03-27 NOTE — Anesthesia Procedure Notes (Signed)
Procedure Name: MAC Date/Time: 03/27/2021 11:10 AM Performed by: Amadeo Garnet, CRNA Pre-anesthesia Checklist: Patient identified, Emergency Drugs available, Suction available and Patient being monitored Patient Re-evaluated:Patient Re-evaluated prior to induction Oxygen Delivery Method: Nasal cannula Preoxygenation: Pre-oxygenation with 100% oxygen Induction Type: IV induction Placement Confirmation: positive ETCO2 Dental Injury: Teeth and Oropharynx as per pre-operative assessment

## 2021-03-27 NOTE — Transfer of Care (Signed)
Immediate Anesthesia Transfer of Care Note  Patient: Sheila Guerrero  Procedure(s) Performed: TRANSESOPHAGEAL ECHOCARDIOGRAM (TEE) (N/A )  Patient Location: Endoscopy Unit  Anesthesia Type:MAC  Level of Consciousness: drowsy and patient cooperative  Airway & Oxygen Therapy: Patient Spontanous Breathing and Patient connected to nasal cannula oxygen  Post-op Assessment: Report given to RN and Post -op Vital signs reviewed and stable  Post vital signs: Reviewed and stable  Last Vitals:  Vitals Value Taken Time  BP 96/61 03/27/21 1126  Temp    Pulse 95 03/27/21 1126  Resp 20 03/27/21 1126  SpO2 92 % 03/27/21 1126  Vitals shown include unvalidated device data.  Last Pain:  Vitals:   03/27/21 1126  PainSc: Asleep         Complications: No complications documented.

## 2021-03-27 NOTE — CV Procedure (Signed)
INDICATIONS:   The patient is 60 years old female with TV and MV endocarditis as well as embolic stroke. She is here for TEE.  PROCEDURE:  Informed consent was discussed including risks, benefits and alternatives for the procedure.  Risks include, but are not limited to, cough, sore throat, vomiting, nausea, somnolence, esophageal and stomach trauma or perforation, bleeding, low blood pressure, aspiration, pneumonia, infection, trauma to the teeth and death.    Patient was given sedation.  The oropharynx was anesthetized with topical lidocaine.  The transesophageal probe was inserted in the esophagus and stomach and multiple views were obtained.  Agitated saline was used after the transesophageal probe was removed from the body.  The patient was kept under observation until the patient left the procedure room.  The patient left the procedure room in stable condition.   COMPLICATIONS:  There were no immediate complications.  FINDINGS:  1. LEFT VENTRICLE: The left ventricle is normal in structure and function.  Wall motion is near normal LVEF 50-55 %.  No thrombus or masses seen in the left ventricle.  2. RIGHT VENTRICLE:  The right ventricle is normal in structure and function without any thrombus or masses.    3. LEFT ATRIUM:  The left atrium is normal without any thrombus or masses.  4. LEFT ATRIAL APPENDAGE:  The left atrial appendage is free of any thrombus or masses.  5. RIGHT ATRIUM:  The right atrium is free of any thrombus or masses.    6. ATRIAL SEPTUM:  The atrial septum is normal without any ASD or PFO.  7. MITRAL VALVE:  The mitral valve is normal in structure and function with centrally directed jet of mild regurgitation, no masses, stenosis. Very small anterior leaflet vegetation seen.  8. TRICUSPID VALVE:  The tricuspid valve is normal in structure and function with minimal regurgitation, masses, stenosis. Larger anterior leaflet sessile vegetation seen..  9. AORTIC VALVE:   The aortic valve is sclerotic and normal in function without regurgitation, masses, stenosis or vegetations.  10. PULMONIC VALVE:  The pulmonic valve is mildly sclerotic and normal in function without regurgitation, masses, stenosis or vegetations.  11. AORTIC ARCH, ASCENDING AND DESCENDING AORTA:  The aorta had mild diffuse atherosclerosis in the ascending or descending aorta.  The aortic arch was normal.  12.  Superior Vena Cava : No thrombus or catheter.  13.  Pulmonary Veins: Visible.  14.  Pulmonary artery: visible and normal.   IMPRESSION:   1. Preserved LV systolic function. 2. Persistent MV and TV vegetations, a bit smaller. 3. No PFO by color doppler this time. 4. Sclerotic AV and mildly sclerotic PV.  RECOMMENDATIONS:    Repeat blood cultures negative post treatment on 03/13/2021. Probably sterile vegetations.

## 2021-03-30 ENCOUNTER — Telehealth: Payer: Self-pay

## 2021-03-30 ENCOUNTER — Encounter (HOSPITAL_COMMUNITY): Payer: Self-pay | Admitting: Cardiovascular Disease

## 2021-03-30 LAB — ECHO TEE
AV Mean grad: 4 mmHg
AV Peak grad: 6.9 mmHg
Ao pk vel: 1.31 m/s

## 2021-03-30 NOTE — Telephone Encounter (Signed)
I spoke with patient and she completed IV abx on 03/18/21. Patient also had blood cx done on 03/27/21.  Patient is asking does she need to keep picc line as well. Please advise

## 2021-03-30 NOTE — Telephone Encounter (Signed)
I spoke with patient's husband and he is stating that the patient was told by cardiology that there was not much change from the previous TEE that she had done before starting IV abx. Patient husband is requesting Dr. Tommy Medal to review the TEE and clarify if the patient has an infection and did she have actually have and infection prior to IV antibiotics. Patient's husband states they are not getting much clarity from cardiology.  Sheila Guerrero Sheila Guerrero

## 2021-03-30 NOTE — Telephone Encounter (Signed)
Called [facility] to give verbal order to have picc line removed and their records show patient is inactive in their system and picc should have been removed after last dose of IV abx on 04/17/21.  I spoke with patient's husband and paient has been using [facility] for nursing and per patient's husband he requested that the picc not be removed until patient's follow up on 04/03/21.  Per Jackelyn Poling with [facility] they will contact [facility] to see if they will still be going out to see the patient to do dressing changes and patient no longer receiving IV abx. Patient's husband states the patient has enough flushes to maintain the picc until Friday 04/03/21 and he has been flushing the picc. He reports dressing has not been changed in a week.   I will follow up with [facility] tomorrow to see if [facility] Govern will be able to go out and do a dressing change for the patient.   Jaquaya Coyle T Brooks Sailors

## 2021-03-30 NOTE — Telephone Encounter (Signed)
Patient is calling in regards to TEE that was performed on 03/27/21. Patient states she received her results and there was no improvement with her being on the IV antibiotics. Patient is asking what is the next treatment plan. Please advise. Monico Sudduth T Brooks Sailors

## 2021-03-30 NOTE — Telephone Encounter (Signed)
I can now read the TEE report and it states.   5. Smaller than before sesile vegetation over medial leaflet.   6. Very small AV vegetation on non-coroanry cusp. The aortic valve is  tricuspid. There is mild calcification of the aortic valve. There is mild  thickening of the aortic valve. Aortic valve regurgitation is not  visualized.   All of this is VERY encouraging that she has been properly treated for an infection. Given that she did not have a fever at the time she was diagnosed with having cardiac vegetation I counselled the patient [place] THAT UNLESS THE BLOOD CULTURES WERE POSITIVE WE WOULD LIKELY NEVER KNOW THE EXACT INFECTIOUS CAUSE OF HER HEART VALVE VEGETATIONS. I DO THINK THE APPEARANCE SEEMS CONSISTENT WITH INFECTION RATHER THAN NON INFECTIOUS MARANTIC OR LIBMAN SACHS ENDOCARDITIS BUT DR Doylene Canard can better address that.  The fact that the vegetations are small and one is SMall-ER is encouraging. If they had CHANGED and gotten LARGE that would be discouraging.  I would still like to check surveillance cultures AFTER she is off antibiotics  If however in the interim the husband continues to not let [facility] pull picc she may get a NEW blood stream infection which could ber very very dangerous to say the least. I do not have any means to determine with certainty that she had an infection (though I think she did) and I cannot provide them an organism unless we could travel in time and get cultures earlier and then would not be a sure thing.

## 2021-03-31 NOTE — Telephone Encounter (Signed)
I spoke with the patient in regards to picc removal, patient stated the home health agency called her today to let her know they will no longer be coming out to do dressing changes because the patient is no longer receiving IV abx. I strongly encouraged the patient that the picc does need to be removed and could cause new blood stream infection. Patient insisted leaving picc in until her OV with Dr. Tommy Medal on 04/03/21. Amey Hossain T Brooks Sailors

## 2021-03-31 NOTE — Telephone Encounter (Signed)
I spoke with patient and advised her of information below. Patient is stating that cardiology called her today and told her she still has vegetation around her heart which makes her concerned. I have reassured  the patient per Dr. Tommy Medal the vegetations are small and one is smaller.  I also strongly encouraged the patient that the picc line needs to be removed before a new blood stream infection. Patient requested to leave picc line in until she meets with Dr. Tommy Medal on Friday and discuss treatment plan. Patient is willing to have picc removed here in our office if possible Friday.  Sheila Guerrero T Brooks Sailors

## 2021-04-01 ENCOUNTER — Encounter: Payer: 59 | Admitting: Occupational Therapy

## 2021-04-01 ENCOUNTER — Ambulatory Visit: Payer: 59 | Admitting: Physical Therapy

## 2021-04-01 ENCOUNTER — Encounter: Payer: 59 | Admitting: Speech Pathology

## 2021-04-01 LAB — CULTURE, BLOOD (ROUTINE X 2): Culture: NO GROWTH

## 2021-04-01 NOTE — Telephone Encounter (Signed)
I had a very good conversation with Dr Doylene Canard today and he and I are on the same page

## 2021-04-01 NOTE — Telephone Encounter (Signed)
I spoke with Dr. Doylene Canard and advised him to give Dr. Tommy Medal a call to discuss treatment plan for the patient. I have provided Dr. Lucianne Lei Dam's cell to Dr. Doylene Canard.  Sheila Guerrero

## 2021-04-03 ENCOUNTER — Ambulatory Visit (INDEPENDENT_AMBULATORY_CARE_PROVIDER_SITE_OTHER): Payer: 59 | Admitting: Infectious Disease

## 2021-04-03 ENCOUNTER — Other Ambulatory Visit: Payer: Self-pay

## 2021-04-03 ENCOUNTER — Ambulatory Visit: Payer: 59 | Admitting: Physical Therapy

## 2021-04-03 VITALS — BP 131/81 | HR 107 | Resp 16 | Ht 65.0 in | Wt 175.0 lb

## 2021-04-03 DIAGNOSIS — I76 Septic arterial embolism: Secondary | ICD-10-CM

## 2021-04-03 DIAGNOSIS — E1169 Type 2 diabetes mellitus with other specified complication: Secondary | ICD-10-CM

## 2021-04-03 DIAGNOSIS — I639 Cerebral infarction, unspecified: Secondary | ICD-10-CM | POA: Diagnosis not present

## 2021-04-03 DIAGNOSIS — I6330 Cerebral infarction due to thrombosis of unspecified cerebral artery: Secondary | ICD-10-CM

## 2021-04-03 DIAGNOSIS — I330 Acute and subacute infective endocarditis: Secondary | ICD-10-CM

## 2021-04-03 DIAGNOSIS — E785 Hyperlipidemia, unspecified: Secondary | ICD-10-CM

## 2021-04-03 NOTE — Progress Notes (Signed)
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  Patient ID: Sheila Guerrero, female    DOB: 06/05/1961, 60 y.o.   MRN: [id number]  HPI  Sheila Guerrero is a 60 year old female who was admitted to hospital and found to haveacute/subacute right MCA territory stroke. Findings were thought possibly  consistent with septic embolization.  She underwent transthoracic echocardiogram and transesophageal echocardiogram that showed evidence of vegetations on the tricuspid and mitral valves.  She also had some back pain which radiated down her leg.  We did blood cultures on her which were unrevealing and started on daptomycin and ceftriaxone.  We did repeat blood cultures afterwards to make sure her blood was sterile prior to inserting a PICC line.  We have performed MRI of the spine which was unrevealing  He is continued on her antimicrobials without problems her labs were reviewed the been sent them home health and are unremarkable.  He was also on antiplatelet therapy  She was on course to complete her antibiotics when she was admitted to the hospital again on March 13, 2021.  She apparently woke up with nausea and vomiting that day and then was having word finding difficulty fatigue and gait instability.  T done on admission had no acute abnormality MRI showed a left small anterior thalamic infarct MRA of the head and neck showed right M1 and V4 stenosis.  Neurology recommended her getting aspirin and Plavix for 3 weeks and then either aspirin or Plavix afterwards.  They recommended going to high-dose Lipitor once she came off of daptomycin.  Since then the patient does have follow-up TEE with Dr. Doylene Canard.  Probably he reported that the vegetations were 25% smaller than previously.  Her vegetation on the mitral valve was described a very small mobile vegetation on anterior leaflet.  She had also a smaller  than before sessile vegetation over the medial leaflet of the tricuspid valve, there is also a very small aortic valve vegetation on the noncoronary cusp of the aortic valve.  Patient and her husband were distressed by the fact that there was still abnormalities on the valves and concerned that she could have further strokes from this.  I think that is very very very unlikely given the size of the vegetations.  I am quite encouraged that they have diminished in size.  Due to their concerns about the findings on the TEE not showing complete resolution the patient had kept her PICC line since her antibiotic stop date on 20 April.  She came today with her husband to clinic.  They are now agreeable to removal of the PICC line.  I explained to her and her husband that there were multiple reasons that she could have strokes including the stenoses found on imaging her diabetes her hyperlipidemia and most recently found by Dr. Jannifer Franklin methylenetetrahydrofolate reductase deficiency.  She and her husband were not aware of any of the MRI findings that were found while she was an inpatient.  They do have follow-up with Dr. Jannifer Franklin.  They have asked for second opinion from a cardiologist. I tried to explain to her and her husband that we had given her correct treatment for culture negative endocarditis. She does not have risk for unusual organisms such as Brucella or Bartonella.  The fact that the vegetations have diminished in size and is quite reassuring.  I can understand that they are concerned that there still are abnormalities on  the valves but that is quite typical wound endocarditis is treated.  She is understandably worried about having further strokes.  I do not think she will have any further strokes from these very small vegetations on the heart valves.  I try to explain that her strokes could have been from the heart valve but could also be from her other multiple risk factors including her  significant stenoses that were seen on MRA of the brain.  Splane to that occasionally when someone does not respond to antimicrobial therapy (which is not the case with her) and continues to have emboli to the brain while on effective antibiotic therapy because they have large vegetations on the valves that are continuing to embolized that then it is no indication for open heart surgery.  She however as I mentioned is not a patient who would be in need of open heart surgery.    I think that she weakly needs to be in again by neurology and have the various causes of her strokes explained clearly to her and her husband.    Past Medical History:  Diagnosis Date  . Angioedema   . Anxiety   . Asthma   . Depression   . Diabetes mellitus, type II ([place])   . Dyslipidemia   . Family history of adverse reaction to anesthesia    mother severely confused after general anesthesia  . History of CVA (cerebrovascular accident)   . HTN (hypertension) 2013  . Panic attacks   . Pneumonia   . Septic embolism ([place]) 02/10/2021  . Situational stress   . Subacute endocarditis 02/10/2021    Past Surgical History:  Procedure Laterality Date  . BUBBLE STUDY  02/10/2021   Procedure: BUBBLE STUDY;  Surgeon: Dixie Dials, MD;  Location: [place];  Service: Cardiovascular;;  . CHOLECYSTECTOMY  2010  . TEE WITHOUT CARDIOVERSION N/A 02/10/2021   Procedure: TRANSESOPHAGEAL ECHOCARDIOGRAM (TEE);  Surgeon: Dixie Dials, MD;  Location: [facility] ENDOSCOPY;  Service: Cardiovascular;  Laterality: N/A;  . TEE WITHOUT CARDIOVERSION N/A 03/27/2021   Procedure: TRANSESOPHAGEAL ECHOCARDIOGRAM (TEE);  Surgeon: Dixie Dials, MD;  Location: [facility] ENDOSCOPY;  Service: Cardiovascular;  Laterality: N/A;    Family History  Problem Relation Age of Onset  . Hypertension Mother   . Other Mother        blood disorder  . Hypertension Father   . Stroke Father   . Diabetes Maternal Aunt       Social History   Socioeconomic  History  . Marital status: Married    Spouse name: Alveta Heimlich  . Number of children: 0  . Years of education: Bachelors  . Highest education level: Not on file  Occupational History  . Occupation: Homemaker now  Tobacco Use  . Smoking status: Never Smoker  . Smokeless tobacco: Never Used  Substance and Sexual Activity  . Alcohol use: Yes    Comment: rarely  . Drug use: Never  . Sexual activity: Yes    Birth control/protection: None  Other Topics Concern  . Not on file  Social History Narrative   Lives at home with husband.   Right-handed.   No daily use of caffeine.   Social Determinants of Health   Financial Resource Strain: Not on file  Food Insecurity: Not on file  Transportation Needs: Not on file  Physical Activity: Not on file  Stress: Not on file  Social Connections: Not on file    Allergies  Allergen Reactions  . Sulfa Antibiotics Swelling  . Dulaglutide  Other reaction(s): rash  . Empagliflozin     Other reaction(s): recurrent yeast  . Iodinated Diagnostic Agents     Other reaction(s): angioedema with CT dye  . Latex Rash     Current Outpatient Medications:  .  aspirin EC 81 MG EC tablet, Take 1 tablet (81 mg total) by mouth daily. Swallow whole., Disp: 30 tablet, Rfl: 11 .  atorvastatin (LIPITOR) 80 MG tablet, Take 1 tablet (80 mg total) by mouth daily., Disp: 30 tablet, Rfl: 11 .  carvedilol (COREG) 6.25 MG tablet, Take 6.25 mg by mouth 2 (two) times daily with a meal., Disp: , Rfl:  .  clopidogrel (PLAVIX) 75 MG tablet, Take 1 tablet (75 mg total) by mouth daily for 21 days., Disp: 21 tablet, Rfl: 0 .  EPINEPHrine 0.3 mg/0.3 mL IJ SOAJ injection, Inject 0.3 mg into the muscle daily as needed for anaphylaxis., Disp: , Rfl:  .  ezetimibe (ZETIA) 10 MG tablet, Take 1 tablet (10 mg total) by mouth daily for 10 days., Disp: 10 tablet, Rfl: 0 .  Insulin Pen Needle (PEN NEEDLES) 29G X 12MM MISC, 1 each by Does not apply route daily., Disp: 30 each, Rfl: 2 .   l-methylfolate-B6-B12 (METANX) 3-35-2 MG TABS tablet, Take 1 tablet by mouth daily., Disp: 90 tablet, Rfl: 3 .  LANTUS SOLOSTAR 100 UNIT/ML Solostar Pen, Inject 20 Units into the skin at bedtime., Disp: , Rfl:  .  metFORMIN (GLUCOPHAGE-XR) 500 MG 24 hr tablet, Take 2,000 mg by mouth daily., Disp: , Rfl:  .  pregabalin (LYRICA) 75 MG capsule, Take 150 mg by mouth 2 (two) times daily., Disp: , Rfl:  .  sacubitril-valsartan (ENTRESTO) 24-26 MG, Take 1 tablet by mouth 2 (two) times daily., Disp: 180 tablet, Rfl: 0 .  traMADol (ULTRAM) 50 MG tablet, Take 50 mg by mouth 2 (two) times daily as needed for moderate pain., Disp: , Rfl:    Review of Systems  Constitutional: Negative for activity change, appetite change, chills, diaphoresis, fatigue, fever and unexpected weight change.  HENT: Negative for congestion, rhinorrhea, sinus pressure, sneezing, sore throat and trouble swallowing.   Eyes: Negative for photophobia and visual disturbance.  Respiratory: Negative for cough, chest tightness, shortness of breath, wheezing and stridor.   Cardiovascular: Negative for chest pain, palpitations and leg swelling.  Gastrointestinal: Negative for abdominal distention, abdominal pain, anal bleeding, blood in stool, constipation, diarrhea, nausea and vomiting.  Genitourinary: Negative for difficulty urinating, dysuria, flank pain and hematuria.  Musculoskeletal: Negative for arthralgias, back pain, gait problem, joint swelling and myalgias.  Skin: Negative for color change, pallor, rash and wound.  Neurological: Negative for dizziness, tremors, weakness and light-headedness.  Hematological: Negative for adenopathy. Does not bruise/bleed easily.  Psychiatric/Behavioral: Negative for agitation, behavioral problems, confusion, decreased concentration, dysphoric mood and sleep disturbance.       Objective:   Physical Exam Constitutional:      General: She is not in acute distress.    Appearance: Normal  appearance. She is well-developed. She is not ill-appearing or diaphoretic.  HENT:     Head: Normocephalic and atraumatic.     Right Ear: Hearing and external ear normal.     Left Ear: Hearing and external ear normal.     Nose: No nasal deformity or rhinorrhea.  Eyes:     General: No scleral icterus.    Conjunctiva/sclera: Conjunctivae normal.     Right eye: Right conjunctiva is not injected.     Left eye: Left conjunctiva is  not injected.     Pupils: Pupils are equal, round, and reactive to light.  Neck:     Vascular: No JVD.  Cardiovascular:     Rate and Rhythm: Normal rate and regular rhythm.     Heart sounds: Normal heart sounds, S1 normal and S2 normal. No murmur heard. No friction rub.  Pulmonary:     Effort: No respiratory distress.     Breath sounds: No stridor. No wheezing, rhonchi or rales.  Abdominal:     General: Bowel sounds are normal. There is no distension.     Palpations: Abdomen is soft.     Tenderness: There is no abdominal tenderness.  Musculoskeletal:        General: Normal range of motion.     Right shoulder: Normal.     Left shoulder: Normal.     Cervical back: Normal range of motion and neck supple.     Right hip: Normal.     Left hip: Normal.     Right knee: Normal.     Left knee: Normal.  Lymphadenopathy:     Head:     Right side of head: No submandibular, preauricular or posterior auricular adenopathy.     Left side of head: No submandibular, preauricular or posterior auricular adenopathy.     Cervical: No cervical adenopathy.     Right cervical: No superficial or deep cervical adenopathy.    Left cervical: No superficial or deep cervical adenopathy.  Skin:    General: Skin is warm and dry.     Coloration: Skin is not pale.     Findings: No abrasion, bruising, ecchymosis, erythema, lesion or rash.     Nails: There is no clubbing.  Neurological:     Mental Status: She is alert and oriented to person, place, and time.     Sensory: No sensory  deficit.     Coordination: Coordination normal.     Gait: Gait normal.  Psychiatric:        Attention and Perception: She is attentive.        Mood and Affect: Mood normal.        Speech: Speech normal.        Behavior: Behavior normal. Behavior is cooperative.        Thought Content: Thought content normal.        Judgment: Judgment normal.    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 She is clearly not having strokes SOLEY because of the vegetations that were discovered    I spent greater than 40 minutes with the patient including greater than 50% of time in face to face counsel of the patient during the nature of culture-negative endocarditis how we treated the risks involved with continued PICC staying in place the different causes of strokes and how they are managed and in coordination of her care.

## 2021-04-03 NOTE — Progress Notes (Signed)
Per verbal order from Dr Tommy Medal, 40 cm Single Lumen Peripherally Inserted Central Catheter removed from right basilic, tip intact. No sutures present. RN confirmed length per chart. Dressing was clean and dry. Petroleum dressing applied. Pt advised no heavy lifting with this arm, leave dressing for 24 hours and call the office or seek emergent care if dressing becomes soaked with blood or swelling or sharp pain presents. Patient verbalized understanding and agreement.  Patient's questions answered to their satisfaction. Patient tolerated procedure well, RN walked patient to check out. ] Carlean Purl, RN

## 2021-04-10 ENCOUNTER — Ambulatory Visit: Payer: 59 | Admitting: Occupational Therapy

## 2021-04-10 ENCOUNTER — Ambulatory Visit: Payer: 59

## 2021-04-10 ENCOUNTER — Ambulatory Visit: Payer: 59 | Admitting: Physical Therapy

## 2021-04-16 ENCOUNTER — Other Ambulatory Visit: Payer: Self-pay

## 2021-04-16 ENCOUNTER — Encounter: Payer: 59 | Attending: Physical Medicine & Rehabilitation | Admitting: Physical Medicine & Rehabilitation

## 2021-04-16 ENCOUNTER — Encounter: Payer: Self-pay | Admitting: Physical Medicine & Rehabilitation

## 2021-04-16 VITALS — BP 110/78 | HR 101 | Temp 98.0°F | Ht 65.0 in | Wt 179.8 lb

## 2021-04-16 DIAGNOSIS — G890 Central pain syndrome: Secondary | ICD-10-CM

## 2021-04-16 NOTE — Patient Instructions (Signed)
THalamic pain syndrome  Central post stroke pain syndrome  Dejerine Roussy syndrome  Rec Trial of gabapentin starting at 100mg  3 times a day increasing to max dose to 800mg  3 x per day if tolerating and needed

## 2021-04-16 NOTE — Progress Notes (Signed)
Subjective:    Patient ID: Sheila Guerrero, female    DOB: October 18, 1961, 60 y.o.   MRN: [id number]  HPI 60 year old female with primary complaint of right foot as well as left hemibody tingling aching type pain.  Hemibody pain occurred after right PCA distribution infarct which included occipital as well as thalamic capsular infarcts on the right side in March 2022.  She had onset of right foot tingling and aching and worsening of left hemibody tingling and aching starting in March 12 2021 when an MRI of the brain demonstrated left thalamic infarct.  The patient also had transient aphasia.  She has been treated by neurology as well as cardiology.  The patient has a cryptogenic endocarditis and has has been treated with IV antibiotics as well as placed on aspirin and Plavix.  No history of IV drug abuse The patient is currently undergoing outpatient PT and OT.  She is independent with all self-care and mobility.  She does have a gait disorder she has difficulty telling where her feet are.  Her pain is rated as a 7-8 out of 10 improves with ice worsens with walking as well as prolonged sitting.  She has fair relief from her medications which include pregabalin 150 mg twice daily as well as tramadol 50 mg twice daily.  She has not tried gabapentin.  She complains that the pregabalin causes drowsiness.  Other review of systems includes trouble walking and weakness patient has had some weight loss.  Prestroke activity level walked 2 miles per day and doing yoga twice weekly Pain Inventory Average Pain 7 Pain Right Now 8 My pain is constant, tingling and aching   MRI LUMBAR SPINE WITHOUT CONTRAST   TECHNIQUE: Multiplanar, multisequence MR imaging of the lumbar spine was performed. No intravenous contrast was administered.   COMPARISON:  None.   FINDINGS: Segmentation:  5 lumbar type vertebral bodies.   Alignment: Scoliotic curvature convex to the left with the apex at L3.   Vertebrae: No  evidence of fracture. No sign of bone or joint infection. No sign of infectious discitis. Benign appearing fat within the L1 vertebral body. This study was ordered and performed without contrast.   Conus medullaris and cauda equina: Conus extends to the L1 level. Conus and cauda equina appear normal. I do not see any finding to suggest epidural infection. Contrast administration would increase sensitivity to early disease.   Paraspinal and other soft tissues: Negative   Disc levels:   T12-L1 and L1-2: Normal   L2-3: Mild bulging of the disc.  No compressive stenosis.   L3-4: Moderate bulging of the disc. Mild narrowing of both lateral recesses. No compressive stenosis.   L4-5: Mild bulging of the disc.  No compressive stenosis.   L5-S1: Endplate osteophytes and shallow left posterolateral disc herniation. Facet and ligamentous hypertrophy. Narrowing of the subarticular lateral recess on the left that could possibly affect the left S1 nerve.   IMPRESSION: 1. No sign of epidural infection or other spinal infection in the lumbar region. This study was ordered without contrast. Contrast administration would increase sensitivity to early disease. 2. Lumbar degenerative changes as outlined above. Left posterolateral disc herniation at L5-S1 with narrowing of the subarticular lateral recess on the left that could possibly affect the left S1 nerve.     Electronically Signed   By: Nelson Chimes M.D.   On: 02/10/2021 13:05   EXAM: MR OF THE LEFT HIP WITHOUT CONTRAST   TECHNIQUE: Multiplanar, multisequence MR imaging  was performed. No intravenous contrast was administered.   COMPARISON:  09/08/2020   FINDINGS: Bones: No acute fracture. No dislocation. No femoral head avascular necrosis. No bony erosion or periostitis. Bony pelvis intact. SI joints and pubic symphysis within normal limits. Mild right hip osteoarthritis with associated labral degeneration. No right hip joint  effusion. Degenerative disc disease of L5-S1.   Articular cartilage and labrum   Articular cartilage: Mild chondral thinning and surface irregularity without focal defect.   Labrum: Superior labral degeneration and tearing. 5 mm paralabral cyst along the anteroinferior margin of the labrum.   Joint or bursal effusion   Joint effusion:  None.   Bursae: No bursal fluid collections.   Muscles and tendons   Muscles and tendons: Mild tendinosis of the bilateral hamstring tendon origins. The gluteal, iliopsoas, rectus femoris, and adductor tendons appear intact without tear or significant tendinosis. Normal muscle bulk and signal intensity without edema, atrophy, or fatty infiltration.   Other findings   Miscellaneous: No soft tissue edema or fluid collection. No inguinal lymphadenopathy. No acute findings within the pelvis.   IMPRESSION: 1. No acute findings. Specifically, no evidence of septic arthritis or osteomyelitis. 2. Mild bilateral hip osteoarthritis with superior labral degeneration. 3. Mild bilateral hamstring tendinosis.     Electronically Signed   By: Davina Poke D.O.   On: 02/10/2021 13:14 CLINICAL DATA:  Aphasia, nausea and vomiting.   EXAM: MRI HEAD WITHOUT AND WITH CONTRAST   MRA HEAD WITHOUT CONTRAST   MRA NECK WITHOUT AND WITH CONTRAST   TECHNIQUE: Multiplanar, multiecho pulse sequences of the brain and surrounding structures were obtained without and with intravenous contrast. Angiographic images of the Circle of Willis were obtained using MRA technique without intravenous contrast. Angiographic images of the neck were obtained using MRA technique without and with intravenous contrast. Carotid stenosis measurements (when applicable) are obtained utilizing NASCET criteria, using the distal internal carotid diameter as the denominator.   CONTRAST:  90mL GADAVIST GADOBUTROL 1 MMOL/ML IV SOLN   COMPARISON:  None.   FINDINGS: MRI HEAD  FINDINGS   Brain: Abnormal diffusion restriction within the ventral left thalamus and along the dorsolateral right thalamus. 15-20 chronic microhemorrhages in a mixed central and peripheral pattern. There is multifocal hyperintense T2-weighted signal within the white matter. Parenchymal volume and CSF spaces are normal. The midline structures are normal. Mild contrast enhancement at the site of the right thalamic infarct, suggesting that it may be slightly older than the left.   Vascular: Major flow voids are preserved.   Skull and upper cervical spine: Normal calvarium and skull base. Visualized upper cervical spine and soft tissues are normal.   Sinuses/Orbits:No paranasal sinus fluid levels or advanced mucosal thickening. No mastoid or middle ear effusion. Normal orbits.   MRA HEAD FINDINGS   POSTERIOR CIRCULATION:   --Vertebral arteries: Severe stenosis of the distal right V4 segment.   --Inferior cerebellar arteries: Normal.   --Basilar artery: Normal.   --Superior cerebellar arteries: Normal.   --Posterior cerebral arteries: Atherosclerotic irregularity without stenosis.   ANTERIOR CIRCULATION:   --Intracranial internal carotid arteries: Normal.   --Anterior cerebral arteries (ACA): Normal.   --Middle cerebral arteries (MCA): Atherosclerotic irregularity with severe stenosis of the distal right MCA M1 segment.   ANATOMIC VARIANTS: None   MRA NECK FINDINGS   No carotid stenosis or occlusion. The V1 3 segments are normal. Left dominant vertebral configuration. Normal 3 vessel aortic arch branching pattern.   IMPRESSION: 1. Acute to early subacute right and  likely late subacute left thalamic infarcts. No mass effect or acute hemorrhage. 2. Severe stenosis of the distal right MCA M1 segment. 3. Severe stenosis of the distal right V4 segment. 4. No stenosis of the carotid or vertebral arteries in the neck.     Electronically Signed   By: Ulyses Jarred  M.D.   On: 03/13/2021 02:11    COMPARISON:  Comparison made with recent noncontrast brain MRI from 02/07/2021.   FINDINGS: Brain: Age-related cerebral atrophy with moderately advanced chronic microvascular ischemic disease again noted. Few scattered remote lacunar infarcts noted as well. Findings seen and characterized on recent brain MRI from 02/07/2021.   Following contrast administration, there is mild patchy post-contrast enhancement about the recently identified right thalamic capsular infarct (series 7, image 30). Additional subtle patchy enhancement about the previously identified left occipital infarct as well (series 9, image 17). Findings in keeping with probable early subacute ischemic infarcts.   No other abnormal or pathologic enhancement within the brain. No other mass lesion, midline shift, or mass effect. No hydrocephalus or extra-axial fluid collection. No other new or progressive finding on this limited exam.   Vascular: Normal intravascular enhancement seen throughout the major intracranial vascular structures.   Skull and upper cervical spine: Craniocervical junction within normal limits. Bone marrow signal intensity normal. No scalp soft tissue abnormality.   Sinuses/Orbits: Globes and orbital soft tissues within normal limits. Paranasal sinuses are largely clear. No mastoid effusion.   Other: None.   IMPRESSION: 1. Patchy post-contrast enhancement about the recently identified right thalamocapsular and left occipital infarcts, in keeping with early subacute ischemic infarcts. No associated mass effect. 2. Underlying age-related cerebral atrophy with moderately advanced chronic microvascular ischemic disease. No other new intracranial abnormality on this limited postcontrast only exam.     Electronically Signed   By: Jeannine Boga M.D.   On: 02/10/2021 00:02  LOCATION OF PAIN  Total left side and Right foot  BOWEL Number of stools per  week: 7 Oral laxative use No  Type of laxative No Enema or suppository use No  History of colostomy No  Incontinent No   BLADDER Normal In and out cath, frequency N/A Able to self cath No  Bladder incontinence No  Frequent urination No  Leakage with coughing No  Difficulty starting stream No  Incomplete bladder emptying No    Mobility use a cane how many minutes can you walk? Unknown ability to climb steps?  yes do you drive?  no Do you have any goals in this area?  yes  Function what is your job? Have not worked since 2017.  Neuro/Psych weakness tingling trouble walking dizziness  Prior Studies Any changes since last visit?  yes At [facility] 03/25/21 TEE Scope  Physicians involved in your care Any changes since last visit?  no New Patient   Family History  Problem Relation Age of Onset  . Hypertension Mother   . Other Mother        blood disorder  . Hypertension Father   . Stroke Father   . Diabetes Maternal Aunt    Social History   Socioeconomic History  . Marital status: Married    Spouse name: Alveta Heimlich  . Number of children: 0  . Years of education: Bachelors  . Highest education level: Not on file  Occupational History  . Occupation: Homemaker now  Tobacco Use  . Smoking status: Never Smoker  . Smokeless tobacco: Never Used  Vaping Use  . Vaping Use: Never used  Substance and Sexual Activity  . Alcohol use: Yes    Comment: rarely  . Drug use: Never  . Sexual activity: Yes    Birth control/protection: None  Other Topics Concern  . Not on file  Social History Narrative   Lives at home with husband.   Right-handed.   No daily use of caffeine.   Social Determinants of Health   Financial Resource Strain: Not on file  Food Insecurity: Not on file  Transportation Needs: Not on file  Physical Activity: Not on file  Stress: Not on file  Social Connections: Not on file   Past Surgical History:  Procedure Laterality Date  . BUBBLE STUDY   02/10/2021   Procedure: BUBBLE STUDY;  Surgeon: Dixie Dials, MD;  Location: [facility];  Service: Cardiovascular;;  . CHOLECYSTECTOMY  2010  . TEE WITHOUT CARDIOVERSION N/A 02/10/2021   Procedure: TRANSESOPHAGEAL ECHOCARDIOGRAM (TEE);  Surgeon: Dixie Dials, MD;  Location: [facility] ENDOSCOPY;  Service: Cardiovascular;  Laterality: N/A;  . TEE WITHOUT CARDIOVERSION N/A 03/27/2021   Procedure: TRANSESOPHAGEAL ECHOCARDIOGRAM (TEE);  Surgeon: Dixie Dials, MD;  Location: [facility] ENDOSCOPY;  Service: Cardiovascular;  Laterality: N/A;   Past Medical History:  Diagnosis Date  . Angioedema   . Anxiety   . Asthma   . Depression   . Diabetes mellitus, type II ([place])   . Dyslipidemia   . Family history of adverse reaction to anesthesia    mother severely confused after general anesthesia  . History of CVA (cerebrovascular accident)   . HTN (hypertension) 2013  . Panic attacks   . Pneumonia   . Septic embolism ([place]) 02/10/2021  . Situational stress   . Subacute endocarditis 02/10/2021   BP 110/78   Pulse (!) 101   Temp 98 F (36.7 C)   Ht 5\' 5"  (1.651 m)   Wt 179 lb 12.8 oz (81.6 kg)   LMP 11/25/2011   SpO2 97%   BMI 29.92 kg/m   Opioid Risk Score:   Fall Risk Score:  `1  Depression screen PHQ 2/9  No flowsheet data found. Review of Systems  Constitutional:       Weight loss  Endocrine:       High CBG reading 180-200 daily  Musculoskeletal: Positive for gait problem.       Tingling  Neurological: Positive for dizziness and weakness.       Objective:   Physical Exam Constitutional:      Appearance: She is obese.  HENT:     Head: Normocephalic and atraumatic.  Eyes:     Extraocular Movements: Extraocular movements intact.     Conjunctiva/sclera: Conjunctivae normal.     Pupils: Pupils are equal, round, and reactive to light.  Cardiovascular:     Rate and Rhythm: Normal rate and regular rhythm.     Heart sounds: Normal heart sounds.  Pulmonary:     Effort: Pulmonary effort  is normal. No respiratory distress.     Breath sounds: Normal breath sounds.  Abdominal:     General: Abdomen is flat. Bowel sounds are normal. There is no distension.     Palpations: Abdomen is soft.  Musculoskeletal:     Cervical back: Normal range of motion. No rigidity.  Skin:    General: Skin is warm and dry.  Neurological:     Mental Status: She is alert and oriented to person, place, and time.     Comments: Motor strength is 5/5 bilateral deltoid, bicep, tricep, grip, hip flexion, knee extensor, ankle dorsiflexor  and plantar flexor Sensation is intact to light touch proprioception and pinprick bilateral upper and lower limbs Speech without evidence of his arthralgia or aphasia Gait wide-based reduced step length, unable to perform tandem gait Romberg is negative  Psychiatric:        Mood and Affect: Mood normal.        Behavior: Behavior normal.   Right foot has no evidence of joint swelling in the ankle or toes.  No pain with foot range of motion there is pain with passive plantar flexion bilaterally which the patient states is premorbid.        Assessment & Plan:  1.  Thalamic pain syndrome affecting left hemibody as well as right foot.  The patient has had bilateral thalamic infarcts.  Fortunately her neurologic deficits are fairly mild she does have gait abnormality but strength and sensation are mostly intact.  We discussed that she still may experience spontaneous recovery at least of her pain complaints.  In the meantime would recommend continue tramadol 50 mg twice daily prescribed by her primary care physician, also recommend trial of gabapentin 100 mg 3 times daily escalating every 3 to 4 weeks x 100 to 200 mg per dose until there is improvement with symptoms or the patient is limited by side effects.  Would stop pregabalin since this appears to be causing some drowsiness. Physical medicine rehab follow-up in 8 weeks

## 2021-04-17 ENCOUNTER — Ambulatory Visit: Payer: 59 | Admitting: Physical Therapy

## 2021-04-17 ENCOUNTER — Ambulatory Visit: Payer: 59

## 2021-04-17 ENCOUNTER — Encounter: Payer: Self-pay | Admitting: Occupational Therapy

## 2021-04-17 ENCOUNTER — Ambulatory Visit: Payer: 59 | Attending: Internal Medicine

## 2021-04-17 ENCOUNTER — Ambulatory Visit: Payer: 59 | Admitting: Occupational Therapy

## 2021-04-17 DIAGNOSIS — M6281 Muscle weakness (generalized): Secondary | ICD-10-CM

## 2021-04-17 DIAGNOSIS — R41844 Frontal lobe and executive function deficit: Secondary | ICD-10-CM

## 2021-04-17 DIAGNOSIS — R2681 Unsteadiness on feet: Secondary | ICD-10-CM | POA: Diagnosis present

## 2021-04-17 DIAGNOSIS — R41842 Visuospatial deficit: Secondary | ICD-10-CM

## 2021-04-17 DIAGNOSIS — R278 Other lack of coordination: Secondary | ICD-10-CM | POA: Insufficient documentation

## 2021-04-17 DIAGNOSIS — R2689 Other abnormalities of gait and mobility: Secondary | ICD-10-CM | POA: Diagnosis present

## 2021-04-17 DIAGNOSIS — R41840 Attention and concentration deficit: Secondary | ICD-10-CM | POA: Diagnosis present

## 2021-04-17 DIAGNOSIS — R41841 Cognitive communication deficit: Secondary | ICD-10-CM | POA: Diagnosis not present

## 2021-04-17 NOTE — Therapy (Signed)
[facility] [address] [place], [name], [address] Phone: [phone]   Fax:  [phone]  Speech Language Pathology Treatment- Discharge Summary  Patient Details  Name: Sheila Guerrero MRN: [id number] Date of Birth: 1961-11-04 Referring Provider (SLP): Dr. Barb Merino   Encounter Date: 04/17/2021   End of Session - 04/17/21 1039    Visit Number 4    Number of Visits 17    Date for SLP Re-Evaluation 04/22/21    Authorization Type due to high copay of $40 per discipline, [name] is electing to attend 1x a week for 8 weeks    SLP Start Time 1015    SLP Stop Time  1045    SLP Time Calculation (min) 30 min    Activity Tolerance Patient tolerated treatment well           Past Medical History:  Diagnosis Date  . Angioedema   . Anxiety   . Asthma   . Depression   . Diabetes mellitus, type II ([place])   . Dyslipidemia   . Family history of adverse reaction to anesthesia    mother severely confused after general anesthesia  . History of CVA (cerebrovascular accident)   . HTN (hypertension) 2013  . Panic attacks   . Pneumonia   . Septic embolism ([place]) 02/10/2021  . Situational stress   . Subacute endocarditis 02/10/2021    Past Surgical History:  Procedure Laterality Date  . BUBBLE STUDY  02/10/2021   Procedure: BUBBLE STUDY;  Surgeon: Dixie Dials, MD;  Location: [facility];  Service: Cardiovascular;;  . CHOLECYSTECTOMY  2010  . TEE WITHOUT CARDIOVERSION N/A 02/10/2021   Procedure: TRANSESOPHAGEAL ECHOCARDIOGRAM (TEE);  Surgeon: Dixie Dials, MD;  Location: [facility] ENDOSCOPY;  Service: Cardiovascular;  Laterality: N/A;  . TEE WITHOUT CARDIOVERSION N/A 03/27/2021   Procedure: TRANSESOPHAGEAL ECHOCARDIOGRAM (TEE);  Surgeon: Dixie Dials, MD;  [facility]: [facility] ENDOSCOPY;  Service: Cardiovascular;  Laterality: N/A;    There were no vitals filed for this visit.   Subjective Assessment - 04/17/21 1022     Subjective "I had another stroke"    Currently in Pain? Yes    Pain Score 6     Pain Location Foot   and hand           SPEECH THERAPY DISCHARGE SUMMARY  Visits from Start of Care: 4  Current functional level related to goals / functional outcomes: [name] reports she can independently compensate for current cognitive deficits (attention and memory) and reported desire to discharge from skilled ST intervention at this time as she is pleased with her current progress. Pt recently had 3rd stroke, in which pt reports stroke did not further impact cognition or communication. Pt endorses she is able to effectively compensate and modify her daily routine for accurate completion. Husband is reportedly managing more high level/complex household tasks as compared to before. SLP provided additional cognitive compensations/strategies to utilize at home. Pt verbalized understanding and agreement with ST discharge. Pt is aware of need for additional script for ST services if she elects to return.    Remaining deficits: Minimal to mild attention and memory deficits    Education / Equipment: Memory and attention strategies, functional practice, and modifications       ADULT SLP TREATMENT - 04/17/21 1015      General Information   Behavior/Cognition Alert;Cooperative;Pleasant mood      Treatment Provided   Treatment provided Cognitive-Linquistic      Cognitive-Linquistic Treatment   Treatment focused  on Cognition;Patient/family/caregiver education    Skilled Treatment [name] returned from >1 month gap in therapy due to 3rd stroke. Pt reports no cognitive communication changes s/p stroke; however, she is no longer paying bills, walking dog, or completing laundry as she was previously. Pt endorsed usual fatigue/sleeping, in which pt reports is related to medication management. SLP able to identify areas that could be targeted in ST sessions, including reduced attention and decreased memory related  to household tasks and returning to previously managed tasks. Pt endorsed she manages these tasks with her own compensations and desires to d/c from [facility]. Pt reports she does not want to use external aids previously recommmended in ST sessions, and pt believes she is effectively managing current household situations with her own modificaitons and compensations. Pt requested discharge from [facility] at this time. Pt verbalized understanding and agreement with ST discharge. Pt aware of need for additional script if she wants to return for ST intervention.      Assessment / Recommendations / Plan   Plan Discharge SLP treatment due to (comment)   patient request     Progression Toward Goals   Progression toward goals Goals partially met, education completed, patient discharged from [facility] - 04/17/21 1039    Education Details review of ST goals, discharge summary    Person(s) Educated Patient    Methods Explanation;Demonstration;Handout    Comprehension Verbalized understanding;Returned demonstration            SLP Short Term Goals - 04/17/21 1040      SLP SHORT TERM GOAL #1   Title Pt will use external aids to manage appointments and daily tasks with occasional min A over 2 sessions    Time 2    Period Weeks    Status Partially Met      SLP SHORT TERM GOAL #2   Title Pt will use external aids (such as grocery list organized by department) to successfully obtain all items needed with occasional min A over 2 shopping trips    Time 2    Period Weeks    Status Not Met      SLP SHORT TERM GOAL #3   Title Pt will use compensatory strategies for cognition to pay 3 bills with rare min A from spouse    Time 2    Period Weeks    Status Not Met      SLP SHORT TERM GOAL #4   Title Pt will use visual reminder to lock doors prior to leaving and bedtime with rare min A over 1 week    Time 2    Period Weeks    Status Not Met            SLP Long Term Goals - 04/17/21 1041       SLP LONG TERM GOAL #1   Title Pt will use external aids to complete 3 household tasks a day without leaving them incomplete before intiatating next task with rare min A    Time 6    Period Weeks    Status Partially Met      SLP LONG TERM GOAL #2   Title Pt will report no "mix up" in appointments or schedules over 2 weeks    Time 6    Period Weeks    Status Not Met      SLP LONG TERM GOAL #3   Title Pt will manage all household  bills with supervision from spouse over 1 month    Time 6    Period Weeks    Status Not Met      SLP LONG TERM GOAL #4   Title Pt will carryover memory strategies to recall 3 details from 4 paragraph written passage with occasional min A    Time 7    Period Weeks    Status Not Met      SLP LONG TERM GOAL #5   Title Pt will score a 15 or lower on Neuro-QOL-Cognitive Function- Short Form patient related outcome measure    Time 6    Period Weeks    Status Not Met            Plan - 04/17/21 1049    Clinical Impression Statement [name] Rivenbark is referred to outpatient ST due to cognitive communication impairments s/p CVA 12/2020. Pt returned after >1 month gap in therapy due to 3rd stroke. Pt declined any changes in cognition or communication; however, examples provided regarding reduced attention and recall. SLP reviewed cognitive compensations recommended during previous ST sessions, in which pt reports she did not want to utilize certain recommendations and endorses she is able to independently compensate and modify household tasks for accurate completion and success. Pt requested discharge from skilled ST intervention at this time as pt is pleased with her current progress and elected not to return for completion of POC. Discharge summary completed and reviewed with patient. Pt verbalized understanding and is aware of need for script if she desires to return to ST in the future.    Treatment/Interventions Environmental controls;Cueing hierarchy;SLP  instruction and feedback;Compensatory strategies;Functional tasks;Cognitive reorganization;Compensatory techniques;Internal/external aids;Multimodal communcation approach;Patient/family education;Language facilitation    Potential to Achieve Goals Fair    Potential Considerations Ability to learn/carryover information;Cooperation/participation level    SLP Home Exercise Plan provided    Consulted and Agree with Plan of Care Patient           Patient will benefit from skilled therapeutic intervention in order to improve the following deficits and impairments:   Cognitive communication deficit    Problem List Patient Active Problem List   Diagnosis Date Noted  . Thalamic pain syndrome (hyperesthetic) 04/16/2021  . Cerebral thrombosis with cerebral infarction 03/13/2021  . Acute encephalopathy 03/12/2021  . Bacteremia   . Septic embolism ([place]) 02/10/2021  . Subacute endocarditis 02/10/2021  . Malnutrition of moderate degree 02/09/2021  . Acute ischemic stroke ([place]) 02/08/2021  . Infarction of right thalamus ([place]) 02/07/2021  . Essential hypertension 02/07/2021  . Paresthesia 02/02/2021  . Gait abnormality 02/02/2021  . Confusion 05/08/2018  . Stroke ([facility]) 03/17/2017  . Type 2 diabetes mellitus with hyperlipidemia ([place]) 03/17/2017  . Confusion state 02/16/2017  . Angioedema 02/16/2017  . Panic 02/04/2017  . Adjustment disorder with anxious mood 02/04/2017  . Sinusitis, chronic 12/28/2011  . Rhinitis, allergic 12/28/2011  . Extrinsic asthma 12/07/2011    Alinda Deem, MA CCC-SLP 04/17/2021, 10:58 AM  [facility] [address] [place], [name], [address] Phone: [phone]   Fax:  [phone]   Name: ABBIEGAIL LANDGREN MRN: [id number] Date of Birth: August 20, 1961

## 2021-04-17 NOTE — Patient Instructions (Signed)
Walking Program: Walk with husband, do not hold dog. Walk during cooler times of the day. Start walking 6 min per week with st. Cane. Add 2 minutes to your total walking time everyweek until you can walk 30 min per day comfortably.   wlaking fwd (fast) and walking bwd slow at countertop with one hand on countertop and one hand on cane for HEP

## 2021-04-17 NOTE — Therapy (Signed)
[facility] [address] [place], [name], [address] Phone: [phone]   Fax:  [phone]  Occupational Therapy Treatment & Recertification   Patient Details  Name: Sheila Guerrero MRN: [id number] Date of Birth: [birth year] Referring Provider (OT): Dr. Nigel Bridgeman   Encounter Date: 04/17/2021   OT End of Session - 04/17/21 0941    Visit Number 4    Number of Visits 13    Date for OT Re-Evaluation 06/19/21   +9 weeks at renewal 5/20   Authorization Type [employer]    Authorization Time Period POC written for 12 weks anticipate d/c after 8 weeks (week 3 of 12 on 5/20)    Authorization - Visit Number 4    Authorization - Number of Visits 90   combined   OT Start Time 0932    OT Stop Time 1015    OT Time Calculation (min) 43 min    Activity Tolerance Patient tolerated treatment well    Behavior During Therapy [facility] for tasks assessed/performed           Past Medical History:  Diagnosis Date  . Angioedema   . Anxiety   . Asthma   . Depression   . Diabetes mellitus, type II ([place])   . Dyslipidemia   . Family history of adverse reaction to anesthesia    mother severely confused after general anesthesia  . History of CVA (cerebrovascular accident)   . HTN (hypertension) 2013  . Panic attacks   . Pneumonia   . Septic embolism ([place]) 02/10/2021  . Situational stress   . Subacute endocarditis 02/10/2021    Past Surgical History:  Procedure Laterality Date  . BUBBLE STUDY  02/10/2021   Procedure: BUBBLE STUDY;  Surgeon: Dixie Dials, MD;  Location: [facility];  Service: Cardiovascular;;  . CHOLECYSTECTOMY  2010  . TEE WITHOUT CARDIOVERSION N/A 02/10/2021   Procedure: TRANSESOPHAGEAL ECHOCARDIOGRAM (TEE);  Surgeon: Dixie Dials, MD;  Location: [facility] ENDOSCOPY;  Service: Cardiovascular;  Laterality: N/A;  . TEE WITHOUT CARDIOVERSION N/A 03/27/2021   Procedure: TRANSESOPHAGEAL ECHOCARDIOGRAM (TEE);  Surgeon: Dixie Dials, MD;   Location: [facility] ENDOSCOPY;  Service: Cardiovascular;  Laterality: N/A;    There were no vitals filed for this visit.   Subjective Assessment - 04/17/21 0937    Subjective  "They decreased my blood thinners and the next day I had another stroke. I have more difficulty with my R foot and I have a hard time staying awake causes I'm so medicated"    Pertinent History 60 year old female with past medical history of angioedema, hypertension, hyperlipidemia, type 2 diabetes, anxiety, depression, type 2 diabetes, CVA in 2017, new CVA March 02/07/21    Patient Stated Goals LUE weakness    Currently in Pain? Yes    Pain Score 7     Pain Location Other (Comment)   whole left side of body   Pain Orientation Left    Pain Descriptors / Indicators Pressure    Pain Type Neuropathic pain    Pain Onset More than a month ago    Pain Frequency Constant    Aggravating Factors  bed - positioning    Pain Relieving Factors none           Pt returns to therapy with return orders for PT/OT/ST s/p third stroke since last session on 4/13. OT reviewed all goals and assessed progress and regression. Pt has regressed in coordination and grip strength since last assessment. Pt continues with significant pain in L  hemi of body and currently on medication cocktail and changes to assess this neuropathic changes (followed by Dr. Wynn Banker).   [facility] OT Assessment - 04/17/21 0944      Vision Assessment   Comment pt denies any changes since most recent stroke since last visit on 413      Activity Tolerance   Activity Tolerance Tolerates 30 min activity with multiple rests   pt reports can tolerate about 30 minutes and then very tired     Sensation   Light Touch Impaired by gross assessment   touch feels different left vs right - left side is hyper sensitive   Hot/Cold Impaired Detail   feels different but temperature awareness seems to be the same     Coordination   Gross Motor Movements are Fluid and Coordinated No     Fine Motor Movements are Fluid and Coordinated No    9 Hole Peg Test Right;Left    Right 9 Hole Peg Test 23.78s    Left 9 Hole Peg Test 43.75s      Hand Function   Right Hand Grip (lbs) 56.2    Left Hand Grip (lbs) 26.4           Grooved Pegs with BUE with good time and min difficulty and drops.   Environmental Scanning in therapy gym with moderate distraction (15/16 accuracy - 94% accuracy)                  OT Short Term Goals - 04/17/21 0943      OT SHORT TERM GOAL #1   Title I with inital HEP (including coordination)    Time 6    Period Weeks    Status On-going   pt reports not doing anything at home right now 04/17/21   Target Date 05/01/21   updated at renewal 04/17/21     OT SHORT TERM GOAL #2   Title Pt will verbalize understanding of compensatory strategies for visual and cognitive deficits.    Time 4    Period Weeks    Status On-going      OT SHORT TERM GOAL #3   Title Pt will perform basic cooking and home management modified independently demonstrating good safety awareness.    Time 4    Period Weeks    Status On-going   pt reports cooking at home but will continue to assess 04/17/21     OT SHORT TERM GOAL #4   Title I with sensory precautions for LUE and will demonstrate understanding of compensations during functional activity.    Time 4    Period Weeks    Status On-going      OT SHORT TERM GOAL #5   Title Pt will locate items in a mod distracting environment with 90% or better accuracy in prep for driving/ grocery shopping.    Time 4    Period Weeks    Status Achieved   94% with environmental scanning in therapy gym.     OT SHORT TERM GOAL #6   Title Pt will demonstrate improved fine motor coordination as evidenced by decreasing 9 hole peg test score to 35 secs or less with LUE.    Baseline RUE 24.53, LUE 39.22    Time 4    Period Weeks    Status On-going   L 43.75s 04/17/21     OT SHORT TERM GOAL #7   Title I with bed positioning to  minimize LUE pain at night time.  Time 4    Period Weeks    Status On-going             OT Long Term Goals - 04/17/21 1006      OT LONG TERM GOAL #1   Title I with updated HEP for LUE strength.    Time 12    Period Weeks    Status New    Target Date 06/19/21      OT LONG TERM GOAL #2   Title Pt will demonstrate ability to carry a cup of water in 1 hand and a plate in the other without drops or spills.    Time 12    Period Weeks    Status New      OT LONG TERM GOAL #3   Title Pt will increase LUE grip strength to 36 lbs or greater for increased functional use.    Time 12    Period Weeks    Status Revised   after recent stroke after 4/13 session - L 26.4lbs   Target Date 06/19/21      OT LONG TERM GOAL #4   Title Pt will perform a physical and cogntive task simultaneously with no LOB and 90% or better accuracy.    Time 12    Period Weeks    Status New      OT LONG TERM GOAL #5   Title Pt will complete FOTO survey at d/c.    Time 12    Period Weeks    Status New      OT LONG TERM GOAL #6   Title Pt will menu plan, shop(online or in store) and cook a mod complex meal from start to finish modified independently.    Time 12    Period Weeks    Status New      OT LONG TERM GOAL #7   Title Pt will verbalize recommendations regarding return to driving including driving eval prn.    Time 12    Period Weeks    Status New                 Plan - 04/17/21 1003    Clinical Impression Statement Pt returns to therapy with return orders for PT/OT/ST s/p third stroke since last session on 4/13. OT reviewed all goals and assessed progress and regression. Pt has regressed in coordination and grip strength since last assessment. Pt continues with significant pain in L hemi of body and currently on medication cocktail and changes to assess this neuropathic changes (followed by Dr. Letta Pate).    OT Occupational Profile and History Detailed Assessment- Review of Records  and additional review of physical, cognitive, psychosocial history related to current functional performance    Occupational performance deficits (Please refer to evaluation for details): ADL's;IADL's;Leisure;Social Participation    Body Structure / Function / Physical Skills ADL;Endurance;UE functional use;Balance;Vision;Pain;Flexibility;FMC;ROM;Gait;GMC;Sensation;Decreased knowledge of precautions;Decreased knowledge of use of DME;IADL;Strength;Dexterity    Cognitive Skills Attention;Memory;Problem Solve;Safety Awareness;Sequencing;Temperament/Personality;Thought;Understand    Rehab Potential Good    Clinical Decision Making Limited treatment options, no task modification necessary    Comorbidities Affecting Occupational Performance: May have comorbidities impacting occupational performance    Modification or Assistance to Complete Evaluation  No modification of tasks or assist necessary to complete eval    OT Frequency 1x / week   plus eval   OT Duration Other (comment)   9 weeks since renewal on 5/20   OT Treatment/Interventions Self-care/ADL training;Energy conservation;Visual/perceptual remediation/compensation;Patient/family education;DME and/or AE instruction;Paraffin;Passive range  of motion;Balance training;Fluidtherapy;Cryotherapy;Therapist, nutritional;Therapeutic activities;Manual Therapy;Therapeutic exercise;Moist Heat;Neuromuscular education;Cognitive remediation/compensation    Plan continue to assess vision in functional context, cooking    Consulted and Agree with Plan of Care Patient           Patient will benefit from skilled therapeutic intervention in order to improve the following deficits and impairments:   Body Structure / Function / Physical Skills: ADL,Endurance,UE functional use,Balance,Vision,Pain,Flexibility,FMC,ROM,Gait,GMC,Sensation,Decreased knowledge of precautions,Decreased knowledge of use of DME,IADL,Strength,Dexterity Cognitive Skills:  Attention,Memory,Problem Solve,Safety Awareness,Sequencing,Temperament/Personality,Thought,Understand     Visit Diagnosis: Muscle weakness (generalized)  Unsteadiness on feet  Frontal lobe and executive function deficit  Attention and concentration deficit  Other lack of coordination  Visuospatial deficit    Problem List Patient Active Problem List   Diagnosis Date Noted  . Thalamic pain syndrome (hyperesthetic) 04/16/2021  . Cerebral thrombosis with cerebral infarction 03/13/2021  . Acute encephalopathy 03/12/2021  . Bacteremia   . Septic embolism ([employer]) 02/10/2021  . Subacute endocarditis 02/10/2021  . Malnutrition of moderate degree 02/09/2021  . Acute ischemic stroke ([place]) 02/08/2021  . Infarction of right thalamus ([place]) 02/07/2021  . Essential hypertension 02/07/2021  . Paresthesia 02/02/2021  . Gait abnormality 02/02/2021  . Confusion 05/08/2018  . Stroke ([place]) 03/17/2017  . Type 2 diabetes mellitus with hyperlipidemia ([place]) 03/17/2017  . Confusion state 02/16/2017  . Angioedema 02/16/2017  . Panic 02/04/2017  . Adjustment disorder with anxious mood 02/04/2017  . Sinusitis, chronic 12/28/2011  . Rhinitis, allergic 12/28/2011  . Extrinsic asthma 12/07/2011    Zachery Conch MOT, OTR/L  04/17/2021, 12:29 PM  [facility] [address] [place], [name], [address] Phone: [phone]   Fax:  [phone]  Name: BAYLEIGH LOFLIN MRN: [id number] Date of Birth: Dec 28, 1960

## 2021-04-17 NOTE — Therapy (Signed)
[facility] [address] [place], [name], [address] Phone: [phone]   Fax:  [phone]  Physical Therapy Re-Certification Note  Patient Details  Name: Sheila Guerrero MRN: [id number] Date of Birth: Jul 01, 1961 Referring Provider (PT): Barb Merino (not reffered by, but previous neurologist has been Dr. Krista Blue)   Encounter Date: 04/17/2021   PT End of Session - 04/17/21 0842    Visit Number 4    Number of Visits 12    Date for PT Re-Evaluation 06/12/21    Authorization Type UHC - $40 co pay per discipline; Recert from 12/30/98 to 06/12/21 (1x/week for 8 sessions)    PT Start Time 0845    PT Stop Time 0930    PT Time Calculation (min) 45 min    Equipment Utilized During Treatment Gait belt    Activity Tolerance Patient tolerated treatment well    Behavior During Therapy [facility] for tasks assessed/performed           Past Medical History:  Diagnosis Date  . Angioedema   . Anxiety   . Asthma   . Depression   . Diabetes mellitus, type II ([place])   . Dyslipidemia   . Family history of adverse reaction to anesthesia    mother severely confused after general anesthesia  . History of CVA (cerebrovascular accident)   . HTN (hypertension) 2013  . Panic attacks   . Pneumonia   . Septic embolism ([place]) 02/10/2021  . Situational stress   . Subacute endocarditis 02/10/2021    Past Surgical History:  Procedure Laterality Date  . BUBBLE STUDY  02/10/2021   Procedure: BUBBLE STUDY;  Surgeon: Dixie Dials, MD;  Location: [place];  Service: Cardiovascular;;  . CHOLECYSTECTOMY  2010  . TEE WITHOUT CARDIOVERSION N/A 02/10/2021   Procedure: TRANSESOPHAGEAL ECHOCARDIOGRAM (TEE);  Surgeon: Dixie Dials, MD;  Location: [facility] ENDOSCOPY;  Service: Cardiovascular;  Laterality: N/A;  . TEE WITHOUT CARDIOVERSION N/A 03/27/2021   Procedure: TRANSESOPHAGEAL ECHOCARDIOGRAM (TEE);  Surgeon: Dixie Dials, MD;  Location[facility] ENDOSCOPY;   Service: Cardiovascular;  Laterality: N/A;    There were no vitals filed for this visit.   Subjective Assessment - 04/17/21 0938    Subjective Had another stroke, now she is walking with a cane. She sleeps most of the day due to pain medication and lyrica. Her whole left side of body is painful. Recent stroke caused her R foot pain.    Pertinent History history of hypertension, hyperlipidemia, type 2 diabetes on Metformin at home, anxiety and depression, history of a stroke in 2017, Angioedema    Limitations Walking    Diagnostic tests MRI showed small acute to early subacute infarct in the right  thalamus/posterior limb of internal capsule and small subacute left occipital lobe infarct.    Patient Stated Goals wants to know what she can do and shouldn't do    Currently in Pain? Yes    Pain Score 6     Pain Location Other (Comment)   Left side of body   Pain Orientation Left    Pain Descriptors / Indicators Aching;Burning    Pain Type Neuropathic pain    Pain Onset More than a month ago    Pain Onset More than a month ago              [facility] PT Assessment - 04/17/21 0857      6 Minute Walk- Baseline   6 Minute Walk- Baseline yes      6  minute walk test results    Aerobic Endurance Distance Walked 500   with st. cane and SBA     Functional Gait  Assessment   Gait Level Surface Walks 20 ft, slow speed, abnormal gait pattern, evidence for imbalance or deviates 10-15 in outside of the 12 in walkway width. Requires more than 7 sec to ambulate 20 ft.   17 seconds   Change in Gait Speed Makes only minor adjustments to walking speed, or accomplishes a change in speed with significant gait deviations, deviates 10-15 in outside the 12 in walkway width, or changes speed but loses balance but is able to recover and continue walking.    Gait with Horizontal Head Turns Performs head turns smoothly with slight change in gait velocity (eg, minor disruption to smooth gait path), deviates 6-10 in  outside 12 in walkway width, or uses an assistive device.    Gait with Vertical Head Turns Performs task with slight change in gait velocity (eg, minor disruption to smooth gait path), deviates 6 - 10 in outside 12 in walkway width or uses assistive device    Gait and Pivot Turn Pivot turns safely in greater than 3 sec and stops with no loss of balance, or pivot turns safely within 3 sec and stops with mild imbalance, requires small steps to catch balance.    Step Over Obstacle Is able to step over one shoe box (4.5 in total height) without changing gait speed. No evidence of imbalance.    Gait with Narrow Base of Support Ambulates less than 4 steps heel to toe or cannot perform without assistance.    Gait with Eyes Closed Walks 20 ft, slow speed, abnormal gait pattern, evidence for imbalance, deviates 10-15 in outside 12 in walkway width. Requires more than 9 sec to ambulate 20 ft.    Ambulating Backwards Walks 20 ft, slow speed, abnormal gait pattern, evidence for imbalance, deviates 10-15 in outside 12 in walkway width.    Steps Two feet to a stair, must use rail.    Total Score 13    FGA comment: 13/30                Today's treatment: FGA performed 6 minute walk performed  wlaking fwd (fast) and walking bwd slow at countertop with one hand on countertop and one hand on cane for HEP: 10x laps  We discussed benefits of rollator compared to cane for safety and walking endurance. Pt doesn't want to use rollator as she thinks it will cause her more back pain.      Patient education: Walking Program: Walk with husband, do not hold dog. Walk during cooler times of the day. Start walking 6 min per week with st. Cane. Add 2 minutes to your total walking time everyweek until you can walk 30 min per day comfortably.   wlaking fwd (fast) and walking bwd slow at countertop with one hand on countertop and one hand on cane for HEP            PT Short Term Goals - 04/17/21 0903       PT SHORT TERM GOAL #1   Title Pt will improve FGA score to at least a 20/30 in order to demo decr fall risk. ALL STGS DUE 03/25/21    Baseline 17/30 (02/25/21); 13/30 (04/17/21)    Time 4    Period Weeks    Status Revised    Target Date 05/15/21      PT SHORT TERM  GOAL #2   Title Pt will improve gait speed with no AD to at least 2.9 ft/sec in order to demo improved community mobility.    Baseline 2.66 ft/sec with no AD (02/25/21); 1.26f/s with straight cane (04/17/21)    Time 4    Period Weeks    Status Revised    Target Date 05/15/21      PT SHORT TERM GOAL #3   Title Pt will improve mCTSIB condition 4 to at least 15 seconds in order to demo improved vestibular input for balance.    Baseline 4 seconds    Time 4    Period Weeks    Status Not Met      PT SHORT TERM GOAL #4   Title Pt will demo compliance with walking program to improve walking endurance    Baseline Walking program issued on 04/17/21    Time 4    Period Weeks    Status New    Target Date 06/12/21      PT SHORT TERM GOAL #5   Title Pt will demo at least 150 feet more in 6 minutes with a cane to improve walking endurance    Baseline 04/17/21: 500 feet with cane    Time 4    Status New    Target Date 05/15/21             PT Long Term Goals - 04/17/21 0904      PT LONG TERM GOAL #1   Title Pt will be independent with final HEP in order to build upon functional gains made in therapy. ALL LTGS DUE 04/22/21    Time 8    Period Weeks    Status New    Target Date 06/12/21      PT LONG TERM GOAL #2   Title Pt will improve FGA score to at least a 23/30 in order to demo decr fall risk.    Baseline 17/30    Time 8    Period Weeks    Status New    Target Date 06/12/21      PT LONG TERM GOAL #3   Title Pt will improve gait speed with no AD to at least 3.2 ft/sec in order to demo improved community mobility.    Baseline 2.66 ft/sec with no AD    Time 8    Period Weeks    Status New    Target Date  06/12/21      PT LONG TERM GOAL #4   Title Pt will improve FOTO score to at least a 65 in order to demo improved functional outcomes.    Baseline 53    Time 8    Period Weeks    Status New    Target Date 06/12/21      PT LONG TERM GOAL #5   Title Pt will demo at least 750 feet with st. cane in 6 minutes to improve community ambulation    Baseline 5024ft st. cane (04/17/21)    Time 8    Period Weeks    Status New    Target Date 06/12/21                 Plan - 04/17/21 0925    Clinical Impression Statement Pt had been seen for total of 4 sessions since 02/25/21. Pt was hospitalized in April due to 3 rd stroke and patient reports that she has regressed in her gait and balance since then. patient reports of no  new falls. Patient is currently ambulating with straight cane. patient has regressed in her functional gait assessment score from 17/20 to 13/20 and her walking speed has declined as well. Patient's short term and long term goals were updated today and plan of care was modified. Patient will continue to benefit from skilled PT to improve her gait, strength and balance and work towards her functional gaols.    Personal Factors and Comorbidities Comorbidity 3+;Past/Current Experience;Time since onset of injury/illness/exacerbation   pt with $40 co pay per discipline   Comorbidities history of hypertension, hyperlipidemia, type 2 diabetes, anxiety and depression, history of a stroke in 2017, Angioedema    Examination-Activity Limitations Stairs;Transfers;Locomotion Level    Examination-Participation Restrictions Community Activity;Driving    Stability/Clinical Decision Making Evolving/Moderate complexity    Rehab Potential Good    PT Frequency 1x / week    PT Duration 8 weeks    PT Treatment/Interventions ADLs/Self Care Home Management;Gait training;Stair training;Functional mobility training;Therapeutic activities;Therapeutic exercise;Neuromuscular re-education;Balance  training;DME Instruction;Patient/family education;Vestibular;Visual/perceptual remediation/compensation;Moist Heat;Aquatic Therapy;Orthotic Fit/Training;Manual techniques;Passive range of motion;Energy conservation;Joint Manipulations;Spinal Manipulations    PT Next Visit Plan Has patient been compliant with walking program, has she practiced wlaking fwd (fast) and walking bwd slow at countertop with one hand on countertop and one hand on cane for HEP; work on FGA components; updated HEP as necessary           Patient will benefit from skilled therapeutic intervention in order to improve the following deficits and impairments:  Abnormal gait,Decreased balance,Difficulty walking,Dizziness,Decreased strength,Impaired sensation,Postural dysfunction,Pain  Visit Diagnosis: Muscle weakness (generalized)  Unsteadiness on feet  Other abnormalities of gait and mobility     Problem List Patient Active Problem List   Diagnosis Date Noted  . Thalamic pain syndrome (hyperesthetic) 04/16/2021  . Cerebral thrombosis with cerebral infarction 03/13/2021  . Acute encephalopathy 03/12/2021  . Bacteremia   . Septic embolism ([place]) 02/10/2021  . Subacute endocarditis 02/10/2021  . Malnutrition of moderate degree 02/09/2021  . Acute ischemic stroke ([place]) 02/08/2021  . Infarction of right thalamus ([place]) 02/07/2021  . Essential hypertension 02/07/2021  . Paresthesia 02/02/2021  . Gait abnormality 02/02/2021  . Confusion 05/08/2018  . Stroke ([place]) 03/17/2017  . Type 2 diabetes mellitus with hyperlipidemia ([place]) 03/17/2017  . Confusion state 02/16/2017  . Angioedema 02/16/2017  . Panic 02/04/2017  . Adjustment disorder with anxious mood 02/04/2017  . Sinusitis, chronic 12/28/2011  . Rhinitis, allergic 12/28/2011  . Extrinsic asthma 12/07/2011    Kerrie Pleasure 04/17/2021, 9:45 AM  [facility] [address] [place], [name],  [address] Phone: [phone]   Fax:  [phone]  Name: ADALI PENNINGS MRN: [id number] Date of Birth: 1961-04-17

## 2021-04-17 NOTE — Patient Instructions (Signed)

## 2021-04-24 ENCOUNTER — Ambulatory Visit: Payer: 59 | Admitting: Physical Therapy

## 2021-04-24 ENCOUNTER — Ambulatory Visit: Payer: 59 | Admitting: Occupational Therapy

## 2021-04-29 ENCOUNTER — Ambulatory Visit: Payer: 59

## 2021-04-29 ENCOUNTER — Encounter: Payer: Self-pay | Admitting: Occupational Therapy

## 2021-04-29 ENCOUNTER — Ambulatory Visit: Payer: 59 | Attending: Internal Medicine | Admitting: Occupational Therapy

## 2021-04-29 ENCOUNTER — Other Ambulatory Visit: Payer: Self-pay

## 2021-04-29 DIAGNOSIS — R41844 Frontal lobe and executive function deficit: Secondary | ICD-10-CM | POA: Diagnosis present

## 2021-04-29 DIAGNOSIS — M6281 Muscle weakness (generalized): Secondary | ICD-10-CM | POA: Diagnosis not present

## 2021-04-29 DIAGNOSIS — R278 Other lack of coordination: Secondary | ICD-10-CM | POA: Insufficient documentation

## 2021-04-29 DIAGNOSIS — R2689 Other abnormalities of gait and mobility: Secondary | ICD-10-CM | POA: Diagnosis present

## 2021-04-29 DIAGNOSIS — R41840 Attention and concentration deficit: Secondary | ICD-10-CM | POA: Diagnosis present

## 2021-04-29 DIAGNOSIS — R41842 Visuospatial deficit: Secondary | ICD-10-CM | POA: Insufficient documentation

## 2021-04-29 DIAGNOSIS — R2681 Unsteadiness on feet: Secondary | ICD-10-CM | POA: Diagnosis present

## 2021-04-29 NOTE — Therapy (Signed)
[facility] [address] [place], [name], [address] Phone: [phone]   Fax:  [phone]  Occupational Therapy Treatment  Patient Details  Name: Sheila Guerrero MRN: [id number] Date of Birth: November 17, 1961 Referring Provider (OT): Dr. Nigel Bridgeman   Encounter Date: 04/29/2021   OT End of Session - 04/29/21 1414    Visit Number 5    Number of Visits 13    Date for OT Re-Evaluation 06/19/21   +9 weeks at renewal 5/20   Authorization Type UHC    Authorization Time Period POC written for 12 weks anticipate d/c after 8 weeks (week 3 of 12 on 5/20)    Authorization - Visit Number 5    Authorization - Number of Visits 90   combined   OT Start Time 8416    OT Stop Time 1400    OT Time Calculation (min) 43 min    Activity Tolerance Patient tolerated treatment well    Behavior During Therapy [facility] for tasks assessed/performed           Past Medical History:  Diagnosis Date  . Angioedema   . Anxiety   . Asthma   . Depression   . Diabetes mellitus, type II ([place])   . Dyslipidemia   . Family history of adverse reaction to anesthesia    mother severely confused after general anesthesia  . History of CVA (cerebrovascular accident)   . HTN (hypertension) 2013  . Panic attacks   . Pneumonia   . Septic embolism ([place]) 02/10/2021  . Situational stress   . Subacute endocarditis 02/10/2021    Past Surgical History:  Procedure Laterality Date  . BUBBLE STUDY  02/10/2021   Procedure: BUBBLE STUDY;  Surgeon: Dixie Dials, MD;  Location: [facility];  Service: Cardiovascular;;  . CHOLECYSTECTOMY  2010  . TEE WITHOUT CARDIOVERSION N/A 02/10/2021   Procedure: TRANSESOPHAGEAL ECHOCARDIOGRAM (TEE);  Surgeon: Dixie Dials, MD;  Location: [facility] ENDOSCOPY;  Service: Cardiovascular;  Laterality: N/A;  . TEE WITHOUT CARDIOVERSION N/A 03/27/2021   Procedure: TRANSESOPHAGEAL ECHOCARDIOGRAM (TEE);  Surgeon: Dixie Dials, MD;  Location: [facility]  ENDOSCOPY;  Service: Cardiovascular;  Laterality: N/A;    There were no vitals filed for this visit.   Subjective Assessment - 04/29/21 1322    Subjective  Pt reports left side pain    Pertinent History 60 year old female with past medical history of angioedema, hypertension, hyperlipidemia, type 2 diabetes, anxiety, depression, type 2 diabetes, CVA in 2017, new CVA March 02/07/21    Pain Score 4     Pain Location --   L side of body   Pain Orientation Left    Pain Descriptors / Indicators Aching    Pain Type Neuropathic pain    Pain Onset More than a month ago    Pain Frequency Intermittent    Aggravating Factors  movment    Pain Relieving Factors meds             Treatment: Tabletop scanning task with 100% accuracy for number cancellation Environmental scanning basic 90% accuracy. Functional reaching with LUE to place and remove graded clothespins(1-8#) from antennae with LUE, for sustained pinch and reach                   OT Education - 04/29/21 1414    Education Details coordination HEP, see pt instructions discussed safety due to sensory deficits in LUE    Person(s) Educated Patient    Methods Explanation;Demonstration;Verbal cues;Handout    Comprehension Verbalized  understanding;Returned demonstration;Verbal cues required            OT Short Term Goals - 04/17/21 0943      OT SHORT TERM GOAL #1   Title I with inital HEP (including coordination)    Time 6    Period Weeks    Status On-going   pt reports not doing anything at home right now 04/17/21   Target Date 05/01/21   updated at renewal 04/17/21     OT SHORT TERM GOAL #2   Title Pt will verbalize understanding of compensatory strategies for visual and cognitive deficits.    Time 4    Period Weeks    Status On-going      OT SHORT TERM GOAL #3   Title Pt will perform basic cooking and home management modified independently demonstrating good safety awareness.    Time 4    Period Weeks     Status On-going   pt reports cooking at home but will continue to assess 04/17/21     OT [place] #4   Title I with sensory precautions for LUE and will demonstrate understanding of compensations during functional activity.    Time 4    Period Weeks    Status On-going      OT SHORT TERM GOAL #5   Title Pt will locate items in a mod distracting environment with 90% or better accuracy in prep for driving/ grocery shopping.    Time 4    Period Weeks    Status Achieved   94% with environmental scanning in therapy gym.     OT SHORT TERM GOAL #6   Title Pt will demonstrate improved fine motor coordination as evidenced by decreasing 9 hole peg test score to 35 secs or less with LUE.    Baseline RUE 24.53, LUE 39.22    Time 4    Period Weeks    Status On-going   L 43.75s 04/17/21     OT SHORT TERM GOAL #7   Title I with bed positioning to minimize LUE pain at night time.    Time 4    Period Weeks    Status On-going             OT Long Term Goals - 04/29/21 1341      OT LONG TERM GOAL #1   Title I with updated HEP for LUE strength.    Time 12    Period Weeks    Status New      OT LONG TERM GOAL #2   Title Pt will demonstrate ability to carry a cup of water in 1 hand and a plate in the other without drops or spills.    Time 12    Period Weeks    Status New      OT LONG TERM GOAL #3   Title Pt will increase LUE grip strength to 36 lbs or greater for increased functional use.    Time 12    Period Weeks    Status Revised   after recent stroke after 4/13 session - L 26.4lbs     OT LONG TERM GOAL #4   Title Pt will perform a physical and cogntive task simultaneously with no LOB and 90% or better accuracy.    Time 12    Period Weeks    Status New      OT LONG TERM GOAL #5   Title Pt will complete FOTO survey at d/c.  Time 12    Period Weeks    Status New      OT LONG TERM GOAL #6   Title Pt will menu plan, shop(online or in store) and cook a mod complex meal  from start to finish modified independently.    Time 12    Period Weeks    Status New      OT LONG TERM GOAL #7   Title Pt will verbalize recommendations regarding return to driving including driving eval prn.    Time 12    Period Weeks    Status New                 Plan - 04/29/21 1423    Clinical Impression Statement Pt reports she had a fall and has not done much since last week. Pt was issued updated coordination HEP. Pt to bring in putty next visit.    OT Occupational Profile and History Detailed Assessment- Review of Records and additional review of physical, cognitive, psychosocial history related to current functional performance    Occupational performance deficits (Please refer to evaluation for details): ADL's;IADL's;Leisure;Social Participation    Body Structure / Function / Physical Skills ADL;Endurance;UE functional use;Balance;Vision;Pain;Flexibility;FMC;ROM;Gait;GMC;Sensation;Decreased knowledge of precautions;Decreased knowledge of use of DME;IADL;Strength;Dexterity    Cognitive Skills Attention;Memory;Problem Solve;Safety Awareness;Sequencing;Temperament/Personality;Thought;Understand    Rehab Potential Good    Clinical Decision Making Limited treatment options, no task modification necessary    Comorbidities Affecting Occupational Performance: May have comorbidities impacting occupational performance    Modification or Assistance to Complete Evaluation  No modification of tasks or assist necessary to complete eval    OT Frequency 1x / week   plus eval   OT Duration Other (comment)   9 weeks since renewal on 5/20   OT Treatment/Interventions Self-care/ADL training;Energy conservation;Visual/perceptual remediation/compensation;Patient/family education;DME and/or AE instruction;Paraffin;Passive range of motion;Balance training;Fluidtherapy;Cryotherapy;Therapist, nutritional;Therapeutic activities;Manual Therapy;Therapeutic exercise;Moist Heat;Neuromuscular  education;Cognitive remediation/compensation    Plan cooking activity, look at safety, putty if pt brings in    Consulted and Agree with Plan of Care Patient           Patient will benefit from skilled therapeutic intervention in order to improve the following deficits and impairments:   Body Structure / Function / Physical Skills: ADL,Endurance,UE functional use,Balance,Vision,Pain,Flexibility,FMC,ROM,Gait,GMC,Sensation,Decreased knowledge of precautions,Decreased knowledge of use of DME,IADL,Strength,Dexterity Cognitive Skills: Attention,Memory,Problem Solve,Safety Awareness,Sequencing,Temperament/Personality,Thought,Understand     Visit Diagnosis: Muscle weakness (generalized)  Frontal lobe and executive function deficit  Attention and concentration deficit  Other lack of coordination  Visuospatial deficit    Problem List Patient Active Problem List   Diagnosis Date Noted  . Thalamic pain syndrome (hyperesthetic) 04/16/2021  . Cerebral thrombosis with cerebral infarction 03/13/2021  . Acute encephalopathy 03/12/2021  . Bacteremia   . Septic embolism ([place]) 02/10/2021  . Subacute endocarditis 02/10/2021  . Malnutrition of moderate degree 02/09/2021  . Acute ischemic stroke ([place]) 02/08/2021  . Infarction of right thalamus ([place]) 02/07/2021  . Essential hypertension 02/07/2021  . Paresthesia 02/02/2021  . Gait abnormality 02/02/2021  . Confusion 05/08/2018  . Stroke ([place]) 03/17/2017  . Type 2 diabetes mellitus with hyperlipidemia ([place]) 03/17/2017  . Confusion state 02/16/2017  . Angioedema 02/16/2017  . Panic 02/04/2017  . Adjustment disorder with anxious mood 02/04/2017  . Sinusitis, chronic 12/28/2011  . Rhinitis, allergic 12/28/2011  . Extrinsic asthma 12/07/2011    Sheila Guerrero 04/29/2021, 2:25 PM  [facility] [address] [place], [name], [address] Phone: [phone]   Fax:  [phone]  Name: Sheila Guerrero MRN: [id number] Date of Birth: 05-16-1961

## 2021-04-29 NOTE — Patient Instructions (Signed)
  Coordination Activities  Perform the following activities for 20 minutes 1 times per day with left hand(s).   Deal cards with your thumb (Hold deck in hand and push card off top with thumb).  Pick up coins and stack.  Pick up coins one at a time until you get 5-10 in your hand, then move coins from palm to fingertips to place in a continer  Rotate 2 golf balls in your hand  Bring your putty next visit

## 2021-05-05 NOTE — Progress Notes (Signed)
Cardiology Office Note   Date:  05/19/2021   ID:  Sheila Guerrero, DOB Jan 15, 1961, MRN [id number]  PCP:  Antony Contras, MD  Cardiologist:   Olvin Rohr [place], MD   Chief Complaint  Patient presents with   Cerebrovascular Accident      History of Present Illness: Sheila Guerrero is a 60 y.o. female who is seen at the request of Dr Tommy Medal for evaluation of possible bacterial endocarditis. She has a history of HTN, DM type 2, HLD, and prior CVA in 2017. She was admitted in March 2022 with left sided paresthesias and progressive left sided weakness. She was admitted with acute CVA. MRI brain demonstrated-small acute to early subacute infarct in the right thalamus/posterior limb of the internal capsule, small subacute left occipital lobe infarct, moderately advanced chronic small vessel ischemic disease progressed from 2018 and numerous chronic microhemorrhages consistent with chronic hypertension. Carotid dopplers were negative. Echo showed EF 40-45% (new since 2018). Subsequent TEE reportedly showed multiple small vegetations on both MV leaflets with moderate MR and a ? larger sessile vegetation on the tricuspid valve. Patient had no fever or other source of infection. Multiple blood cultures were negative. She was treated for culture negative endocarditis with IV Rocephin and daptomycin for 6 weeks. DC'd on Entresto and beta blocker. She was readmitted in April with confusion and difficulty speaking and walking. MRI showed subacute left thalamic infarct. CT head negative. EEG was normal. TEE was repeated on April 29 showing EF 45-50%. Reportedly smaller vegetation on medial MV leaflet and tricuspid valve. ? Small vegetation on noncoronary AV cusp. Mild MR and TR.  Since her stroke she is doing well. Still notes problems with her balance but is progressing well with PT and OT. She did develop diarrhea on metformin and stopped this a week ago. She reports good glycemic control on insulin. She  is on high dose statin now. She is otherwise tolerating her medication well. Denies any chest pain, dyspnea, palpitations, dizziness.    Past Medical History:  Diagnosis Date   Angioedema    Anxiety    Asthma    Depression    Diabetes mellitus, type II ([place])    Dyslipidemia    Family history of adverse reaction to anesthesia    mother severely confused after general anesthesia   History of CVA (cerebrovascular accident)    HTN (hypertension) 2013   Panic attacks    Pneumonia    Septic embolism ([place]) 02/10/2021   Situational stress    Subacute endocarditis 02/10/2021    Past Surgical History:  Procedure Laterality Date   BUBBLE STUDY  02/10/2021   Procedure: BUBBLE STUDY;  Surgeon: Dixie Dials, MD;  Location: [place];  Service: Cardiovascular;;   CHOLECYSTECTOMY  2010   TEE WITHOUT CARDIOVERSION N/A 02/10/2021   Procedure: TRANSESOPHAGEAL ECHOCARDIOGRAM (TEE);  Surgeon: Dixie Dials, MD;  Location: [facility] ENDOSCOPY;  Service: Cardiovascular;  Laterality: N/A;   TEE WITHOUT CARDIOVERSION N/A 03/27/2021   Procedure: TRANSESOPHAGEAL ECHOCARDIOGRAM (TEE);  Surgeon: Dixie Dials, MD;  Location: [facility] ENDOSCOPY;  Service: Cardiovascular;  Laterality: N/A;     Current Outpatient Medications  Medication Sig Dispense Refill   aspirin EC 81 MG EC tablet Take 1 tablet (81 mg total) by mouth daily. Swallow whole. 30 tablet 11   atorvastatin (LIPITOR) 80 MG tablet Take 1 tablet (80 mg total) by mouth daily. 30 tablet 11   carvedilol (COREG) 6.25 MG tablet Take 6.25 mg by mouth 2 (two) times daily with  a meal.     clopidogrel (PLAVIX) 75 MG tablet Take 1 tablet by mouth daily.     Cobalamin Combinations (VITAMIN B12-FOLIC ACID) 694-854 MCG TABS Take by mouth.     EPINEPHrine 0.3 mg/0.3 mL IJ SOAJ injection Inject 0.3 mg into the muscle daily as needed for anaphylaxis.     Insulin Pen Needle (PEN NEEDLES) 29G X 12MM MISC 1 each by Does not apply route daily. 30 each 2   LANTUS SOLOSTAR 100  UNIT/ML Solostar Pen Inject 30 Units into the skin at bedtime.     pregabalin (LYRICA) 150 MG capsule Take 150 mg by mouth 3 (three) times daily.     traMADol (ULTRAM) 50 MG tablet Take 50 mg by mouth 2 (two) times daily as needed for moderate pain.     No current facility-administered medications for this visit.    Allergies:   Sulfa antibiotics, Dulaglutide, Empagliflozin, Iodinated diagnostic agents, and Latex    Social History:  The patient  reports that she has never smoked. She has never used smokeless tobacco. She reports current alcohol use. She reports that she does not use drugs.   Family History:  The patient's family history includes Diabetes in her maternal aunt; Hypertension in her father and mother; Other in her mother; Stroke in her father.    ROS:  Please see the history of present illness.   Otherwise, review of systems are positive for none.   All other systems are reviewed and negative.    PHYSICAL EXAM: VS:  BP 138/82 (BP Location: Right Arm, Patient Position: Sitting, Cuff Size: Normal)   Pulse 86   Ht 5\' 5"  (1.651 m)   Wt 181 lb (82.1 kg)   LMP 11/25/2011   BMI 30.12 kg/m  , BMI Body mass index is 30.12 kg/m. GEN: Well nourished, well developed, in no acute distress  HEENT: normal  Neck: no JVD, carotid bruits, or masses Cardiac: RRR; no murmurs, rubs, or gallops,no edema  Respiratory:  clear to auscultation bilaterally, normal work of breathing GI: soft, nontender, nondistended, + BS MS: no deformity or atrophy  Skin: warm and dry, no rash Neuro:  Strength and sensation are intact Psych: euthymic mood, full affect   EKG:  EKG is ordered today. The ekg ordered today demonstrates NSR rate 86. Mild nonspecific T wave abnormality. I have personally reviewed and interpreted this study.    Recent Labs: 03/12/2021: ALT 30; BUN 17; Creatinine, Ser 0.78; Hemoglobin 15.7; Platelets 293; Potassium 3.8; Sodium 138    Lipid Panel    Component Value  Date/Time   CHOL 284 (H) 03/13/2021 0445   TRIG 387 (H) 03/13/2021 0445   HDL 34 (L) 03/13/2021 0445   CHOLHDL 8.4 03/13/2021 0445   VLDL 77 (H) 03/13/2021 0445   LDLCALC 173 (H) 03/13/2021 0445      Wt Readings from Last 3 Encounters:  05/19/21 181 lb (82.1 kg)  04/16/21 179 lb 12.8 oz (81.6 kg)  04/03/21 175 lb (79.4 kg)      Other studies Reviewed: Additional studies/ records that were reviewed today include:  Echo 02/08/21: IMPRESSIONS     1. Left ventricular ejection fraction, by estimation, is 40 to 45%. The  left ventricle has mildly decreased function. The left ventricle  demonstrates global hypokinesis. There is moderate concentric left  ventricular hypertrophy. Indeterminate diastolic  filling due to E-A fusion.   2. Right ventricular systolic function is normal. The right ventricular  size is normal. Tricuspid regurgitation signal is  inadequate for assessing  PA pressure.   3. The mitral valve is grossly normal. Trivial mitral valve  regurgitation. No evidence of mitral stenosis.   4. The aortic valve is tricuspid. Aortic valve regurgitation is not  visualized. No aortic stenosis is present.   5. The inferior vena cava is normal in size with greater than 50%  respiratory variability, suggesting right atrial pressure of 3 mmHg.   Conclusion(s)/Recommendation(s): No intracardiac source of embolism    TEE 02/10/21: IMPRESSIONS     1. Left ventricular ejection fraction, by estimation, is 45 to 50%. The  left ventricle has mildly decreased function. The left ventricle  demonstrates global hypokinesis. Left ventricular diastolic function could  not be evaluated.   2. Right ventricular systolic function is normal. The right ventricular  size is normal.   3. Left atrial size was moderately dilated. No left atrial/left atrial  appendage thrombus was detected.   4. Right atrial size was mildly dilated.   5. The pericardial effusion is posterior to the left  ventricle. There is  no evidence of cardiac tamponade.   6. Mobile small MV vegetations seen on both leaflets. The mitral valve is  normal in structure. Moderate mitral valve regurgitation.   7. Sessile cauliflower shaped vegetation measuring 0.428 x 0.712 cm on  anterior leaflet mid portion of TV.   8. The aortic valve is tricuspid. Aortic valve regurgitation is not  visualized. No aortic stenosis is present.   9. There is mild (Grade II) atheroma plaque involving the descending  aorta and ascending aorta.  10. The inferior vena cava is normal in size with greater than 50%  respiratory variability, suggesting right atrial pressure of 3 mmHg.  11. Evidence of atrial level shunting detected by color flow Doppler.  Agitated saline contrast bubble study was negative, with no evidence of  any interatrial shunt. There is a small patent foramen ovale with  predominantly right to left shunting across  the atrial septum.    TEE 03/27/21: IMPRESSIONS     1. Left ventricular ejection fraction, by estimation, is 45 to 50%. The  left ventricle has mildly decreased function. The left ventricle  demonstrates global hypokinesis. Left ventricular diastolic function could  not be evaluated.   2. Right ventricular systolic function is normal. The right ventricular  size is normal.   3. Left atrial size was mildly dilated. No left atrial/left atrial  appendage thrombus was detected.   4. The mitral valve is grossly normal. Mild mitral valve regurgitation.   5. Smaller than before sesile vegetation over medial leaflet.   6. Very small AV vegetation on non-coroanry cusp. The aortic valve is  tricuspid. There is mild calcification of the aortic valve. There is mild  thickening of the aortic valve. Aortic valve regurgitation is not  visualized.   7. There is mild (Grade II) atheroma plaque involving the ascending aorta  and descending aorta.   Conclusion(s)/Recommendation(s): Findings are concerning  for  vegetation/infective endocarditis as detailed above. Vegetations 25%  smaller than before.    ASSESSMENT AND PLAN:  1.  Recurrent CVA. I reviewed history and imaging studies. I feel her strokes are due to small vessel disease in the cerebral vessels due to chronic HTN, DM, and HLD. I also reviewed her Echo and TEE images. I  do not feel that there is convincing evidence of vegetations. She has some fine filamentous strands noted on the MV of uncertain significance. I also had one of my partners who is  expert in imaging and he agrees that these structures do not appear to be vegetations. Given lack of any clinical evidence of infection and normal blood cultures I think the weight of evidence is that she never had endocarditis. There is no significant carotid disease or other cardio-embolic source. Recommend she continue ASA and Plavix until follow up with Dr Jannifer Franklin. Needs aggressive risk factor modification with control of DM, HTN and HLD.  2. Chronic systolic CHF. EF is mildly reduced at 45-50%. I think this is also related to chronic HTN and DM. She has class 1 symptoms. Will increase Entresto to 49/51 mg bid. Continue current Coreg dose. Add aldactone 12.5 mg daily. Repeat BMET next week. Follow up with pharmacy in 3-4 weeks. Follow up with me in 2 months. Plan to repeat transthoracic Echo once medications optimized. Low sodium diet. 3. HTN. Monitor with medication changes as noted.  4. Hypercholesterolemia. Now on high dose lipitor. Labs in March showed LDL 55 but repeat in April LDL was 173. Not sure why the discrepancy. Will repeat lipid panel and LFTs next week. Goal LDL < 70 with recurrent CVA 5. DM per primary care. A1c good at 6.1%.     Current medicines are reviewed at length with the patient today.  The patient does not have concerns regarding medicines.  The following changes have been made:  see above  Labs/ tests ordered today include:  No orders of the defined types were  placed in this encounter.    Disposition:   FU with me in 2 months  Signed, Dewane Timson [place], MD  05/19/2021 9:31 AM    [facility] [address], [place], [name], [address] Phone [phone], Fax [phone]

## 2021-05-06 ENCOUNTER — Other Ambulatory Visit: Payer: Self-pay

## 2021-05-06 ENCOUNTER — Encounter: Payer: Self-pay | Admitting: Occupational Therapy

## 2021-05-06 ENCOUNTER — Ambulatory Visit: Payer: 59 | Admitting: Physical Therapy

## 2021-05-06 ENCOUNTER — Ambulatory Visit: Payer: 59 | Admitting: Occupational Therapy

## 2021-05-06 ENCOUNTER — Encounter: Payer: Self-pay | Admitting: Physical Therapy

## 2021-05-06 DIAGNOSIS — R2681 Unsteadiness on feet: Secondary | ICD-10-CM

## 2021-05-06 DIAGNOSIS — R41844 Frontal lobe and executive function deficit: Secondary | ICD-10-CM

## 2021-05-06 DIAGNOSIS — R2689 Other abnormalities of gait and mobility: Secondary | ICD-10-CM

## 2021-05-06 DIAGNOSIS — R41840 Attention and concentration deficit: Secondary | ICD-10-CM

## 2021-05-06 DIAGNOSIS — R41842 Visuospatial deficit: Secondary | ICD-10-CM

## 2021-05-06 DIAGNOSIS — M6281 Muscle weakness (generalized): Secondary | ICD-10-CM

## 2021-05-06 DIAGNOSIS — R278 Other lack of coordination: Secondary | ICD-10-CM

## 2021-05-06 NOTE — Therapy (Signed)
[facility] [address] [place], [name], [address] Phone: [phone]   Fax:  [phone]  Physical Therapy Treatment  Patient Details  Name: Sheila Guerrero MRN: [id number] Date of Birth: February 13, 1961 Referring Provider (PT): Barb Merino (not reffered by, but previous neurologist has been Dr. Krista Blue)   Encounter Date: 05/06/2021   PT End of Session - 05/07/21 0923     Visit Number 5    Number of Visits 12    Date for PT Re-Evaluation 06/12/21    Authorization Type [employer] - $40 co pay per discipline; Recert from 06/23/35 to 06/12/21 (1x/week for 8 sessions)    PT Start Time 1401    PT Stop Time 1443    PT Time Calculation (min) 42 min    Equipment Utilized During Treatment Gait belt    Activity Tolerance Patient tolerated treatment well    Behavior During Therapy WFL for tasks assessed/performed             Past Medical History:  Diagnosis Date   Angioedema    Anxiety    Asthma    Depression    Diabetes mellitus, type II ([place])    Dyslipidemia    Family history of adverse reaction to anesthesia    mother severely confused after general anesthesia   History of CVA (cerebrovascular accident)    HTN (hypertension) 2013   Panic attacks    Pneumonia    Septic embolism ([place]) 02/10/2021   Situational stress    Subacute endocarditis 02/10/2021    Past Surgical History:  Procedure Laterality Date   BUBBLE STUDY  02/10/2021   Procedure: BUBBLE STUDY;  Surgeon: Dixie Dials, MD;  Location: [facility];  Service: Cardiovascular;;   CHOLECYSTECTOMY  2010   TEE WITHOUT CARDIOVERSION N/A 02/10/2021   Procedure: TRANSESOPHAGEAL ECHOCARDIOGRAM (TEE);  Surgeon: Dixie Dials, MD;  Location[facility] ENDOSCOPY;  Service: Cardiovascular;  Laterality: N/A;   TEE WITHOUT CARDIOVERSION N/A 03/27/2021   Procedure: TRANSESOPHAGEAL ECHOCARDIOGRAM (TEE);  Surgeon: Dixie Dials, MD;  Location: [facility] ENDOSCOPY;  Service: Cardiovascular;   Laterality: N/A;    There were no vitals filed for this visit.   Subjective Assessment - 05/06/21 1402     Subjective After her most recent stroke, has to pay more attention to pain level in right foot and being able to pick up the right foot from the ground. Has shooting pains that go down right leg. No new falls. Is going to be getting an apple watch tomorrow. Has been walking 2x daily - 12 minutes, using the cane.    Pertinent History history of hypertension, hyperlipidemia, type 2 diabetes on Metformin at home, anxiety and depression, history of a stroke in 2017, Angioedema    Limitations Walking    Diagnostic tests MRI showed small acute to early subacute infarct in the right  thalamus/posterior limb of internal capsule and small subacute left occipital lobe infarct.    Patient Stated Goals wants to know what she can do and shouldn't do    Currently in Pain? Yes    Pain Score 5     Pain Location --   whole L side of the body   Pain Orientation Left    Pain Descriptors / Indicators Aching;Tingling    Pain Type Neuropathic pain    Pain Onset More than a month ago    Aggravating Factors  pressure.    Pain Relieving Factors moving    Pain Onset More than a month ago  [facility] Adult PT Treatment/Exercise - 05/06/21 1418       Ambulation/Gait   Ambulation/Gait Yes    Ambulation/Gait Assistance 5: Supervision;4: Min guard    Ambulation/Gait Assistance Details performed without AD - practiced incr gait speed and with quick start/stops and gait with head turns/head nods    Ambulation Distance (Feet) 115 Feet   x1, 230' x 1   Assistive device Straight cane    Gait Pattern Step-through pattern;Decreased step length - left;Decreased step length - right    Ambulation Surface Level;Indoor      High Level Balance   High Level Balance Comments pt reporting wanting to go back to her candlelit yoga class (has a video of it online) and asking what  poses she could do. Showed a modified tree pose in the corner (holding arm with one hand and chair with the other), modified warrior 2 (holding one hand on chair and reaching other hand forwards), and modified standing trunk rotation in corner with chair in front for balance. Discussed making sure pt's husband is there for safety when she first tries. Pt does not remember what other poses are on the video, discussed with pt skimming through the video and reporting back next time what other poses there are so we can go through them safely.               Access Code: [email or web address] URL: [email or web address] Date: 05/06/2021 Prepared by: Janann August  Reviewed HEP before pt had her 2nd stroke, removed marching as it irritated her back and removed back exercises (pt reports no longer having an issue with this). Focused on balance activities at countertop and in corner on compliant surfaces. See MedBridge for further details.   Exercises Tandem Walking with Counter Support - 2 x daily - 5 x weekly - 1 sets - 3 reps Standing Balance with Eyes Closed on Foam - 2 x daily - 5 x weekly - 1 sets - 3 reps - 20 sec hold Standing head turns - 2 x daily - 5 x weekly - 2 sets - 10 reps Step Taps on Low Step - 2 x daily - 5 x weekly - 1 sets - 10 reps Backward Walking with Counter Support - 2 x daily - 5 x weekly - 3 sets       PT Education - 05/07/21 8592     Education Details reviewed HEP and what yoga moves pt can safely perform in the corner.    Person(s) Educated Patient    Methods Explanation;Demonstration;Handout    Comprehension Verbalized understanding;Returned demonstration              PT Short Term Goals - 05/07/21 0928       PT SHORT TERM GOAL #1   Title Pt will improve FGA score to at least a 20/30 in order to demo decr fall risk. ALL STGS DUE 05/15/21    Baseline 17/30 (02/25/21); 13/30 (04/17/21)    Time 4    Period Weeks    Status Revised    Target Date  05/15/21      PT SHORT TERM GOAL #2   Title Pt will improve gait speed with no AD to at least 2.9 ft/sec in order to demo improved community mobility.    Baseline 2.66 ft/sec with no AD (02/25/21); 1.72f/s with straight cane (04/17/21)    Time 4    Period Weeks    Status Revised    Target Date 05/15/21  PT SHORT TERM GOAL #3   Title Pt will improve mCTSIB condition 4 to at least 15 seconds in order to demo improved vestibular input for balance.    Baseline 4 seconds    Time 4    Period Weeks    Status Not Met      PT SHORT TERM GOAL #4   Title Pt will demo compliance with walking program to improve walking endurance    Baseline Walking program issued on 04/17/21    Time 4    Period Weeks    Status New    Target Date 05/15/21      PT SHORT TERM GOAL #5   Title Pt will demo at least 150 feet more in 6 minutes with a cane to improve walking endurance    Baseline 04/17/21: 500 feet with cane    Time 4    Status New    Target Date 05/15/21               PT Long Term Goals - 05/07/21 0928       PT LONG TERM GOAL #1   Title Pt will be independent with final HEP in order to build upon functional gains made in therapy. ALL LTGS DUE 06/12/21    Time 8    Period Weeks    Status New      PT LONG TERM GOAL #2   Title Pt will improve FGA score to at least a 23/30 in order to demo decr fall risk.    Baseline 17/30    Time 8    Period Weeks    Status New      PT LONG TERM GOAL #3   Title Pt will improve gait speed with no AD to at least 3.2 ft/sec in order to demo improved community mobility.    Baseline 2.66 ft/sec with no AD    Time 8    Period Weeks    Status New      PT LONG TERM GOAL #4   Title Pt will improve FOTO score to at least a 65 in order to demo improved functional outcomes.    Baseline 53    Time 8    Period Weeks    Status New      PT LONG TERM GOAL #5   Title Pt will demo at least 750 feet with st. cane in 6 minutes to improve community  ambulation    Baseline 517fet st. cane (04/17/21)    Time 8    Period Weeks    Status New                   Plan - 05/07/21 09233    Clinical Impression Statement Reviewed previous balance program for HEP and updated as appropriate for exercises in the corner and at the countertop, with pt able to perform with no issues. Removed standing marching as it irritated her back last time. Also began to review yoga poses that pt can perform safely in the corner with wall/chair support and husband supervision. Will continue to progress towards LTGs.    Personal Factors and Comorbidities Comorbidity 3+;Past/Current Experience;Time since onset of injury/illness/exacerbation   pt with $40 co pay per discipline   Comorbidities history of hypertension, hyperlipidemia, type 2 diabetes, anxiety and depression, history of a stroke in 2017, Angioedema    Examination-Activity Limitations Stairs;Transfers;Locomotion Level    Examination-Participation Restrictions Community Activity;Driving    Stability/Clinical Decision Making Evolving/Moderate complexity  Rehab Potential Good    PT Frequency 1x / week    PT Duration 8 weeks    PT Treatment/Interventions ADLs/Self Care Home Management;Gait training;Stair training;Functional mobility training;Therapeutic activities;Therapeutic exercise;Neuromuscular re-education;Balance training;DME Instruction;Patient/family education;Vestibular;Visual/perceptual remediation/compensation;Moist Heat;Aquatic Therapy;Orthotic Fit/Training;Manual techniques;Passive range of motion;Energy conservation;Joint Manipulations;Spinal Manipulations    PT Next Visit Plan review yoga poses pt can safely perform in the corner,  work on SLS activities, dynamic gait with no AD, balance on compliant surfaces with vision removed and head motions.    PT Home Exercise Plan [email or web address]    Consulted and Agree with Plan of Care Patient             Patient will benefit from skilled  therapeutic intervention in order to improve the following deficits and impairments:  Abnormal gait, Decreased balance, Difficulty walking, Dizziness, Decreased strength, Impaired sensation, Postural dysfunction, Pain  Visit Diagnosis: Muscle weakness (generalized)  Unsteadiness on feet  Other abnormalities of gait and mobility     Problem List Patient Active Problem List   Diagnosis Date Noted   Thalamic pain syndrome (hyperesthetic) 04/16/2021   Cerebral thrombosis with cerebral infarction 03/13/2021   Acute encephalopathy 03/12/2021   Bacteremia    Septic embolism ([place]) 02/10/2021   Subacute endocarditis 02/10/2021   Malnutrition of moderate degree 02/09/2021   Acute ischemic stroke ([place]) 02/08/2021   Infarction of right thalamus ([place]) 02/07/2021   Essential hypertension 02/07/2021   Paresthesia 02/02/2021   Gait abnormality 02/02/2021   Confusion 05/08/2018   Stroke ([place]) 03/17/2017   Type 2 diabetes mellitus with hyperlipidemia ([place]) 03/17/2017   Confusion state 02/16/2017   Angioedema 02/16/2017   Panic 02/04/2017   Adjustment disorder with anxious mood 02/04/2017   Sinusitis, chronic 12/28/2011   Rhinitis, allergic 12/28/2011   Extrinsic asthma 12/07/2011    Arliss Journey, PT, DPT  05/07/2021, 9:31 AM  [facility] [address] [place], [name], [address] Phone: [phone]   Fax:  [phone]  Name: ZYKERA ABELLA MRN: [id number] Date of Birth: 11/26/1961

## 2021-05-06 NOTE — Patient Instructions (Signed)
Access Code: [email or web address] URL: [email or web address] Date: 05/06/2021 Prepared by: Janann August  Exercises Tandem Walking with Counter Support - 2 x daily - 5 x weekly - 1 sets - 3 reps Standing Balance with Eyes Closed on Foam - 2 x daily - 5 x weekly - 1 sets - 3 reps - 20 sec hold Standing head turns - 2 x daily - 5 x weekly - 2 sets - 10 reps Step Taps on Low Step - 2 x daily - 5 x weekly - 1 sets - 10 reps Backward Walking with Counter Support - 2 x daily - 5 x weekly - 3 sets

## 2021-05-06 NOTE — Therapy (Signed)
[facility] [address] [place], [name], [address] Phone: [phone]   Fax:  [phone]  Occupational Therapy Treatment  Patient Details  Name: Sheila Guerrero MRN: [id number] Date of Birth: 24-Dec-1960 Referring Provider (OT): Dr. Nigel Bridgeman   Encounter Date: 05/06/2021   OT End of Session - 05/06/21 1324    Visit Number 6    Number of Visits 13    Date for OT Re-Evaluation 06/19/21    Authorization Time Period POC written for 12 weks anticipate d/c after 8 weeks (week 3 of 12 on 5/20)    Authorization - Visit Number 6    Authorization - Number of Visits 90    OT Start Time 1320    OT Stop Time 1400    OT Time Calculation (min) 40 min    Activity Tolerance Patient tolerated treatment well    Behavior During Therapy [facility] for tasks assessed/performed           Past Medical History:  Diagnosis Date  . Angioedema   . Anxiety   . Asthma   . Depression   . Diabetes mellitus, type II ([place])   . Dyslipidemia   . Family history of adverse reaction to anesthesia    mother severely confused after general anesthesia  . History of CVA (cerebrovascular accident)   . HTN (hypertension) 2013  . Panic attacks   . Pneumonia   . Septic embolism ([place]) 02/10/2021  . Situational stress   . Subacute endocarditis 02/10/2021    Past Surgical History:  Procedure Laterality Date  . BUBBLE STUDY  02/10/2021   Procedure: BUBBLE STUDY;  Surgeon: Dixie Dials, MD;  Location: [facility];  Service: Cardiovascular;;  . CHOLECYSTECTOMY  2010  . TEE WITHOUT CARDIOVERSION N/A 02/10/2021   Procedure: TRANSESOPHAGEAL ECHOCARDIOGRAM (TEE);  Surgeon: Dixie Dials, MD;  Location: [facility] ENDOSCOPY;  Service: Cardiovascular;  Laterality: N/A;  . TEE WITHOUT CARDIOVERSION N/A 03/27/2021   Procedure: TRANSESOPHAGEAL ECHOCARDIOGRAM (TEE);  Surgeon: Dixie Dials, MD;  Location: [facility] ENDOSCOPY;  Service: Cardiovascular;  Laterality: N/A;    There  were no vitals filed for this visit.   Subjective Assessment - 05/06/21 1322    Subjective  Pt reports today is a good day    Pertinent History 60 year old female with past medical history of angioedema, hypertension, hyperlipidemia, type 2 diabetes, anxiety, depression, type 2 diabetes, CVA in 2017, new CVA March 02/07/21    Currently in Pain? Yes    Pain Score 4     Pain Location --   L side of the body   Pain Orientation Left    Pain Descriptors / Indicators Aching    Pain Type Neuropathic pain    Pain Onset More than a month ago    Pain Frequency Intermittent    Aggravating Factors  movement    Pain Relieving Factors meds             Treatment:ambulating with water glass in RUE and plate of "food" in LUE, with no drops, close supervision  / minguard for balance. Grooved pegs for fine motor coordination in LUE, min difficulty, removing with tweezers for sustained pinch. Rotating 2 golf balls in left hand for improved fine motor coordination, min difficulty v.c Reveiwed bed positioning to minimize pain, handout issued. Pt verbalized understanding. Quadraped lifting alternate LE then rocking forwards and backwards for core stability and UE strength, close supervision.  Pt stood in the corner for tree pose holding wll with one hand and  the back of a chair with the other. Therapist encouraged pt to discuss standing yoga poses with PT, to ensure she can perform safely. Pt reports she will perform only with her husbands assist initially.                     OT Education - 05/06/21 1327    Education Details red putty HEP    Person(s) Educated Patient    Methods Explanation;Demonstration;Verbal cues;Handout    Comprehension Verbalized understanding;Returned demonstration;Verbal cues required            OT Short Term Goals - 05/06/21 1336      OT SHORT TERM GOAL #1   Title I with inital HEP (including coordination)    Time 6    Period Weeks    Status On-going    pt reports not doing anything at home right now 04/17/21   Target Date 05/01/21   updated at renewal 04/17/21     OT [place] #2   Title Pt will verbalize understanding of compensatory strategies for visual and cognitive deficits.    Time 4    Period Weeks    Status On-going      OT SHORT TERM GOAL #3   Title Pt will perform basic cooking and home management modified independently demonstrating good safety awareness.    Time 4    Period Weeks    Status On-going   pt reports cooking at home but will continue to assess 04/17/21     OT [place] #4   Title I with sensory precautions for LUE and will demonstrate understanding of compensations during functional activity.    Time 4    Period Weeks    Status On-going      OT SHORT TERM GOAL #5   Title Pt will locate items in a mod distracting environment with 90% or better accuracy in prep for driving/ grocery shopping.    Time 4    Period Weeks    Status Achieved   94% with environmental scanning in therapy gym.     OT SHORT TERM GOAL #6   Title Pt will demonstrate improved fine motor coordination as evidenced by decreasing 9 hole peg test score to 35 secs or less with LUE.    Baseline RUE 24.53, LUE 39.22    Time 4    Period Weeks    Status On-going   L 43.75s 04/17/21     OT SHORT TERM GOAL #7   Title I with bed positioning to minimize LUE pain at night time.    Time 4    Period Weeks    Status Achieved   handout issued            OT Long Term Goals - 05/06/21 1333      OT LONG TERM GOAL #1   Title I with updated HEP for LUE strength.    Time 12    Period Weeks    Status New      OT LONG TERM GOAL #2   Title Pt will demonstrate ability to carry a cup of water in 1 hand and a plate in the other without drops or spills.    Time 12    Period Weeks    Status Achieved   Pt walked approx 50 ft while carrying without drops.     OT LONG TERM GOAL #3   Title Pt will increase LUE grip strength to 36 lbs or  greater for increased functional use.    Time 12    Period Weeks    Status Revised   after recent stroke after 4/13 session - L 26.4lbs     OT LONG TERM GOAL #4   Title Pt will perform a physical and cogntive task simultaneously with no LOB and 90% or better accuracy.    Time 12    Period Weeks    Status New      OT LONG TERM GOAL #5   Title Pt will complete FOTO survey at d/c.    Time 12    Period Weeks    Status New      OT LONG TERM GOAL #6   Title Pt will menu plan, shop(online or in store) and cook a mod complex meal from start to finish modified independently.    Time 12    Period Weeks    Status New      OT LONG TERM GOAL #7   Title Pt will verbalize recommendations regarding return to driving including driving eval prn.    Time 12    Period Weeks    Status New                  Patient will benefit from skilled therapeutic intervention in order to improve the following deficits and impairments:           Visit Diagnosis: Muscle weakness (generalized)  Frontal lobe and executive function deficit  Attention and concentration deficit  Other lack of coordination  Visuospatial deficit    Problem List Patient Active Problem List   Diagnosis Date Noted  . Thalamic pain syndrome (hyperesthetic) 04/16/2021  . Cerebral thrombosis with cerebral infarction 03/13/2021  . Acute encephalopathy 03/12/2021  . Bacteremia   . Septic embolism ([place]) 02/10/2021  . Subacute endocarditis 02/10/2021  . Malnutrition of moderate degree 02/09/2021  . Acute ischemic stroke ([place]) 02/08/2021  . Infarction of right thalamus ([place]) 02/07/2021  . Essential hypertension 02/07/2021  . Paresthesia 02/02/2021  . Gait abnormality 02/02/2021  . Confusion 05/08/2018  . Stroke ([place]) 03/17/2017  . Type 2 diabetes mellitus with hyperlipidemia ([place]) 03/17/2017  . Confusion state 02/16/2017  . Angioedema 02/16/2017  . Panic 02/04/2017  . Adjustment disorder with anxious mood  02/04/2017  . Sinusitis, chronic 12/28/2011  . Rhinitis, allergic 12/28/2011  . Extrinsic asthma 12/07/2011    Jaculin Rasmus 05/06/2021, 1:53 PM  [facility] [address] [place], [name], [address] Phone: [phone]   Fax:  [phone]  Name: Sheila Guerrero MRN: [id number] Date of Birth: 1961/02/01

## 2021-05-06 NOTE — Patient Instructions (Signed)
1. Grip Strengthening (Resistive Putty)   Squeeze putty using thumb and all fingers. Repeat _20___ times. Do __2__ sessions per day.   2. Roll putty into tube on table and pinch between each finger and thumb x 10 reps each. (can do ring and small finger together)     Copyright  VHI. All rights reserved.   

## 2021-05-13 ENCOUNTER — Other Ambulatory Visit: Payer: Self-pay

## 2021-05-13 ENCOUNTER — Encounter: Payer: Self-pay | Admitting: Physical Therapy

## 2021-05-13 ENCOUNTER — Ambulatory Visit: Payer: 59 | Admitting: Occupational Therapy

## 2021-05-13 ENCOUNTER — Encounter: Payer: Self-pay | Admitting: Occupational Therapy

## 2021-05-13 ENCOUNTER — Ambulatory Visit: Payer: 59 | Admitting: Physical Therapy

## 2021-05-13 DIAGNOSIS — R41842 Visuospatial deficit: Secondary | ICD-10-CM

## 2021-05-13 DIAGNOSIS — M6281 Muscle weakness (generalized): Secondary | ICD-10-CM | POA: Diagnosis not present

## 2021-05-13 DIAGNOSIS — R41844 Frontal lobe and executive function deficit: Secondary | ICD-10-CM

## 2021-05-13 DIAGNOSIS — R2689 Other abnormalities of gait and mobility: Secondary | ICD-10-CM

## 2021-05-13 DIAGNOSIS — R2681 Unsteadiness on feet: Secondary | ICD-10-CM

## 2021-05-13 DIAGNOSIS — R41840 Attention and concentration deficit: Secondary | ICD-10-CM

## 2021-05-13 DIAGNOSIS — R278 Other lack of coordination: Secondary | ICD-10-CM

## 2021-05-13 NOTE — Therapy (Signed)
[facility] [address] [place], [name], [address] Phone: [phone]   Fax:  [phone]  Occupational Therapy Treatment  Patient Details  Name: Sheila Guerrero MRN: [id number] Date of Birth: [birth year] Referring Provider (OT): Dr. Nigel Bridgeman   Encounter Date: 05/13/2021   OT End of Session - 05/13/21 1148     Visit Number 7    Number of Visits 13    Date for OT Re-Evaluation 06/19/21    Authorization Time Period POC written for 12 weks anticipate d/c after 8 weeks    Authorization - Visit Number 7    Authorization - Number of Visits 39    OT Start Time 1148    OT Stop Time 1230    OT Time Calculation (min) 42 min    Activity Tolerance Patient tolerated treatment well    Behavior During Therapy [facility] for tasks assessed/performed             Past Medical History:  Diagnosis Date   Angioedema    Anxiety    Asthma    Depression    Diabetes mellitus, type II ([place])    Dyslipidemia    Family history of adverse reaction to anesthesia    mother severely confused after general anesthesia   History of CVA (cerebrovascular accident)    HTN (hypertension) 2013   Panic attacks    Pneumonia    Septic embolism ([place]) 02/10/2021   Situational stress    Subacute endocarditis 02/10/2021    Past Surgical History:  Procedure Laterality Date   BUBBLE STUDY  02/10/2021   Procedure: BUBBLE STUDY;  Surgeon: Dixie Dials, MD;  Location: [facility];  Service: Cardiovascular;;   CHOLECYSTECTOMY  2010   TEE WITHOUT CARDIOVERSION N/A 02/10/2021   Procedure: TRANSESOPHAGEAL ECHOCARDIOGRAM (TEE);  Surgeon: Dixie Dials, MD;  Location: [facility] ENDOSCOPY;  Service: Cardiovascular;  Laterality: N/A;   TEE WITHOUT CARDIOVERSION N/A 03/27/2021   Procedure: TRANSESOPHAGEAL ECHOCARDIOGRAM (TEE);  Surgeon: Dixie Dials, MD;  Location: [facility] ENDOSCOPY;  Service: Cardiovascular;  Laterality: N/A;    There were no vitals filed for this  visit.   Subjective Assessment - 05/13/21 1149     Subjective  Pt reports falling after the last visit while trying to do some cleaning.    Pertinent History 60 year old female with past medical history of angioedema, hypertension, hyperlipidemia, type 2 diabetes, anxiety, depression, type 2 diabetes, CVA in 2017, new CVA March 02/07/21    Currently in Pain? Yes    Pain Score 5     Pain Location Other (Comment)   whole left side (generalized)   Pain Orientation Left    Pain Descriptors / Indicators Other (Comment)   "raw"   Pain Type Neuropathic pain    Pain Onset More than a month ago    Pain Frequency Constant             ADLs simple warm meal prep with scrambled eggs with mod I and good safety awareness.   Assessed goals   Hand Gripper: with LUE on level 2 with black spring. Pt picked up 1 inch blocks with gripper with min drops and min difficulty. Downgraded to level 1 after approximately 3/4 of blocks.   In hand manipulation  with LUE with small dice and with cardboard ring moving from finger to finger.   Resistance clothespins 1-8# with LUE with reaching and sustained grasp with no evidenced of difficulty.   Physical and Cognitive with ambulating with tossing small ball with  both hands and word generation of foods ABC order. Pt had max difficulty with word generation during task.                     OT Short Term Goals - 05/13/21 1200       OT SHORT TERM GOAL #1   Title I with inital HEP (including coordination)    Time 6    Period Weeks    Status Achieved   pt reports not doing anything at home right now 04/17/21   Target Date 05/01/21   updated at renewal 04/17/21     OT SHORT TERM GOAL #2   Title Pt will verbalize understanding of compensatory strategies for visual and cognitive deficits.    Time 4    Period Weeks    Status Achieved      OT SHORT TERM GOAL #3   Title Pt will perform basic cooking and home management modified independently  demonstrating good safety awareness.    Time 4    Period Weeks    Status Achieved   scrambled eggs in clinic with mod I     OT SHORT TERM GOAL #4   Title I with sensory precautions for LUE and will demonstrate understanding of compensations during functional activity.    Time 4    Period Weeks    Status Achieved      OT SHORT TERM GOAL #5   Title Pt will locate items in a mod distracting environment with 90% or better accuracy in prep for driving/ grocery shopping.    Time 4    Period Weeks    Status Achieved   94% with environmental scanning in therapy gym.     OT SHORT TERM GOAL #6   Title Pt will demonstrate improved fine motor coordination as evidenced by decreasing 9 hole peg test score to 35 secs or less with LUE.    Baseline RUE 24.53, LUE 39.22    Time 4    Period Weeks    Status Achieved   L 32.12s 05/13/21     OT SHORT TERM GOAL #7   Title I with bed positioning to minimize LUE pain at night time.    Time 4    Period Weeks    Status Achieved   handout issued              OT Long Term Goals - 05/13/21 1222       OT LONG TERM GOAL #1   Title I with updated HEP for LUE strength.    Time 12    Period Weeks    Status New      OT LONG TERM GOAL #2   Title Pt will demonstrate ability to carry a cup of water in 1 hand and a plate in the other without drops or spills.    Time 12    Period Weeks    Status Achieved   Pt walked approx 50 ft while carrying without drops.     OT LONG TERM GOAL #3   Title Pt will increase LUE grip strength to 36 lbs or greater for increased functional use.    Time 12    Period Weeks    Status Revised   after recent stroke after 4/13 session - L 26.4lbs     OT LONG TERM GOAL #4   Title Pt will perform a physical and cogntive task simultaneously with no LOB and 90% or better accuracy.  Time 12    Period Weeks    Status New      OT LONG TERM GOAL #5   Title Pt will complete FOTO survey at d/c.    Time 12    Period Weeks     Status New      OT LONG TERM GOAL #6   Title Pt will menu plan, shop(online or in store) and cook a mod complex meal from start to finish modified independently.    Time 12    Period Weeks    Status On-going   pt reports doing this consistently 05/13/21     OT LONG TERM GOAL #7   Title Pt will verbalize recommendations regarding return to driving including driving eval prn.    Time 12    Period Weeks    Status New                   Plan - 05/13/21 1219     Clinical Impression Statement Pt making progress and has met all STGs. Pt progressing towards LTGs.    OT Occupational Profile and History Detailed Assessment- Review of Records and additional review of physical, cognitive, psychosocial history related to current functional performance    Occupational performance deficits (Please refer to evaluation for details): ADL's;IADL's;Leisure;Social Participation    Body Structure / Function / Physical Skills ADL;Endurance;UE functional use;Balance;Vision;Pain;Flexibility;FMC;ROM;Gait;GMC;Sensation;Decreased knowledge of precautions;Decreased knowledge of use of DME;IADL;Strength;Dexterity    Cognitive Skills Attention;Memory;Problem Solve;Safety Awareness;Sequencing;Temperament/Personality;Thought;Understand    Rehab Potential Good    Clinical Decision Making Limited treatment options, no task modification necessary    Comorbidities Affecting Occupational Performance: May have comorbidities impacting occupational performance    Modification or Assistance to Complete Evaluation  No modification of tasks or assist necessary to complete eval    OT Frequency 1x / week   plus eval   OT Duration Other (comment)   9 weeks since renewal on 5/20   OT Treatment/Interventions Self-care/ADL training;Energy conservation;Visual/perceptual remediation/compensation;Patient/family education;DME and/or AE instruction;Paraffin;Passive range of motion;Balance training;Fluidtherapy;Cryotherapy;Advertising account executive;Therapeutic activities;Manual Therapy;Therapeutic exercise;Moist Heat;Neuromuscular education;Cognitive remediation/compensation    Plan physical/cognitive, grip strength    Consulted and Agree with Plan of Care Patient             Patient will benefit from skilled therapeutic intervention in order to improve the following deficits and impairments:   Body Structure / Function / Physical Skills: ADL, Endurance, UE functional use, Balance, Vision, Pain, Flexibility, FMC, ROM, Gait, GMC, Sensation, Decreased knowledge of precautions, Decreased knowledge of use of DME, IADL, Strength, Dexterity Cognitive Skills: Attention, Memory, Problem Solve, Safety Awareness, Sequencing, Temperament/Personality, Thought, Understand     Visit Diagnosis: Muscle weakness (generalized)  Frontal lobe and executive function deficit  Attention and concentration deficit  Other lack of coordination  Visuospatial deficit  Unsteadiness on feet    Problem List Patient Active Problem List   Diagnosis Date Noted   Thalamic pain syndrome (hyperesthetic) 04/16/2021   Cerebral thrombosis with cerebral infarction 03/13/2021   Acute encephalopathy 03/12/2021   Bacteremia    Septic embolism ([place]) 02/10/2021   Subacute endocarditis 02/10/2021   Malnutrition of moderate degree 02/09/2021   Acute ischemic stroke ([place]) 02/08/2021   Infarction of right thalamus ([place]) 02/07/2021   Essential hypertension 02/07/2021   Paresthesia 02/02/2021   Gait abnormality 02/02/2021   Confusion 05/08/2018   Stroke ([place]) 03/17/2017   Type 2 diabetes mellitus with hyperlipidemia ([place]) 03/17/2017   Confusion state 02/16/2017   Angioedema 02/16/2017   Panic 02/04/2017  Adjustment disorder with anxious mood 02/04/2017   Sinusitis, chronic 12/28/2011   Rhinitis, allergic 12/28/2011   Extrinsic asthma 12/07/2011    Zachery Conch MOT, OTR/L  05/13/2021, 12:45 PM  [facility] [address] [place], [name], [address] Phone: [phone]   Fax:  [phone]  Name: Sheila Guerrero MRN: [id number] Date of Birth: [birth year]

## 2021-05-14 ENCOUNTER — Ambulatory Visit: Payer: 59 | Admitting: Neurology

## 2021-05-14 NOTE — Therapy (Signed)
[facility] [address] [place], [place], [address] Phone: [phone]   Fax:  [phone]  Physical Therapy Treatment  Patient Details  Name: Sheila Guerrero MRN: [id number] Date of Birth: November 04, 1961 Referring Provider (PT): Dorcas Carrow (not reffered by, but previous neurologist has been Dr. Terrace Arabia)   Encounter Date: 05/13/2021    05/13/21 1112  PT Visits / Re-Eval  Visit Number 6  Number of Visits 12  Date for PT Re-Evaluation 06/12/21  Authorization  Authorization Type [employer] - $40 co pay per discipline; Recert from 04/17/21 to 06/12/21 (1x/week for 8 sessions)  PT Time Calculation  PT Start Time 1102  PT Stop Time 1145  PT Time Calculation (min) 43 min  PT - End of Session  Equipment Utilized During Treatment Gait belt  Activity Tolerance Patient tolerated treatment well  Behavior During Therapy [facility] for tasks assessed/performed    Past Medical History:  Diagnosis Date   Angioedema    Anxiety    Asthma    Depression    Diabetes mellitus, type II (HCC)    Dyslipidemia    Family history of adverse reaction to anesthesia    mother severely confused after general anesthesia   History of CVA (cerebrovascular accident)    HTN (hypertension) 2013   Panic attacks    Pneumonia    Septic embolism (HCC) 02/10/2021   Situational stress    Subacute endocarditis 02/10/2021    Past Surgical History:  Procedure Laterality Date   BUBBLE STUDY  02/10/2021   Procedure: BUBBLE STUDY;  Surgeon: Orpah Cobb, MD;  Location: [facility] ENDOSCOPY;  Service: Cardiovascular;;   CHOLECYSTECTOMY  2010   TEE WITHOUT CARDIOVERSION N/A 02/10/2021   Procedure: TRANSESOPHAGEAL ECHOCARDIOGRAM (TEE);  Surgeon: Orpah Cobb, MD;  Location: [facility] ENDOSCOPY;  Service: Cardiovascular;  Laterality: N/A;   TEE WITHOUT CARDIOVERSION N/A 03/27/2021   Procedure: TRANSESOPHAGEAL ECHOCARDIOGRAM (TEE);  Surgeon: Orpah Cobb, MD;  Location: [facility]  ENDOSCOPY;  Service: Cardiovascular;  Laterality: N/A;    There were no vitals filed for this visit.    05/13/21 1107  Symptoms/Limitations  Subjective Had a fall the day after her last therapy session. Was vacuming the house and moving dinner trays/furniture around and lost her balance. Fell onto the dinner trays, so unable to state accurately which direction she fell in. Did injure all toes on both feet. Bruising/swelling/pain. Took some time before she was able to get herself to a sturdy surface due to the pain. Was able to eventually get herself up. Did not see a MD about the toes, did see a nurse in the neighborhood. Has been walking since fall, just unable to get her shoes on. Better today, able to get shoes on in past 2 days.  Pertinent History history of hypertension, hyperlipidemia, type 2 diabetes on Metformin at home, anxiety and depression, history of a stroke in 2017, Angioedema  Limitations Walking  Diagnostic tests MRI showed small acute to early subacute infarct in the right  thalamus/posterior limb of internal capsule and small subacute left occipital lobe infarct.  Patient Stated Goals wants to know what she can do and shouldn't do  Pain Assessment  Currently in Pain? Yes  Pain Score 5  Pain Location Foot (left side of body, right foot)  Pain Orientation Right;Left  Pain Descriptors / Indicators Aching;Sore;Tingling;Numbness  Pain Type Acute pain;Neuropathic pain  Pain Onset More than a month ago  Pain Frequency Intermittent  Aggravating Factors  pressure  Pain Relieving Factors moving  05/13/21 1116  6 Minute Walk- Baseline  6 Minute Walk- Baseline y  Modified Borg Scale for Dyspnea 0- Nothing at all  Perceived Rate of Exertion (Borg) 6-  6 Minute walk- Post Test  6 Minute Walk Post Test y  Modified Borg Scale for Dyspnea 0- Nothing at all  Perceived Rate of Exertion (Borg) 6-  6 minute walk test results   Aerobic Endurance Distance Walked 961  Endurance  additional comments wtih straight cane  Functional Gait  Assessment  Gait assessed  Yes  Gait Level Surface 2 (7.47 sec's)  Change in Gait Speed 2  Gait with Horizontal Head Turns 2  Gait with Vertical Head Turns 2  Gait and Pivot Turn 2  Step Over Obstacle 2  Gait with Narrow Base of Support 0  Gait with Eyes Closed 1 (>9 sec's)  Ambulating Backwards 2  Steps 2  Total Score 17  FGA comment: <19= high fall risk        05/13/21 1115  Transfers  Transfers Sit to Stand;Stand to Sit  Sit to Stand 5: Supervision  Stand to Sit 5: Supervision  Ambulation/Gait  Ambulation/Gait Yes  Ambulation/Gait Assistance 5: Supervision;4: Min guard  Ambulation/Gait Assistance Details one episode of toe catching during 6 minute walk with pt self correcting balance.  Ambulation Distance (Feet)  (around gym with session)  Assistive device Straight cane  Gait Pattern Step-through pattern;Decreased step length - left;Decreased step length - right  Ambulation Surface Level;Indoor  Gait velocity 12.23 sec's= 2.68 ft/sec with cane  Self-Care  Self-Care Other Self-Care Comments  Other Self-Care Comments  discussed recent fall and fall prevention Educated pt that based on testing in prior sessions she placed in a fall risk category and should be using an AD with mobilty.Discussed not moving furniture at this time due to this; Verbally reviewed HEP with no issues noted or reported in session.  Neuro Re-ed   Neuro Re-ed Details  for balance/NMR: standing on foam with feet hip width apart EC 30 sec's x 3 reps.         PT Short Term Goals - 05/13/21 1113       PT SHORT TERM GOAL #1   Title Pt will improve FGA score to at least a 20/30 in order to demo decr fall risk. ALL STGS DUE 05/15/21    Baseline 05/13/21: 17/30, improved from 13/30 just not to goal    Time --    Period --    Status Partially Met    Target Date 05/15/21      PT SHORT TERM GOAL #2   Title Pt will improve gait speed with no  AD to at least 2.9 ft/sec in order to demo improved community mobility.    Baseline 05/13/21: 2.68 ft/sec with cane, improved from 1.18 ft/sec just not to goal    Time --    Period --    Status Partially Met    Target Date 05/15/21      PT SHORT TERM GOAL #3   Title Pt will improve mCTSIB condition 4 to at least 15 seconds in order to demo improved vestibular input for balance.    Baseline 05/13/21: met in session today    Time --    Period --    Status Achieved      PT SHORT TERM GOAL #4   Title Pt will demo compliance with walking program to improve walking endurance    Baseline 05/13/21: has a walking program, up to  8 minutes a day    Status Achieved    Target Date 05/15/21      PT SHORT TERM GOAL #5   Title Pt will demo at least 150 feet more in 6 minutes with a cane to improve walking endurance    Baseline 05/13/21: 961 feet (increased from 500 feet at last assessment)    Time --    Status Achieved    Target Date --               PT Long Term Goals - 05/07/21 4019       PT LONG TERM GOAL #1   Title Pt will be independent with final HEP in order to build upon functional gains made in therapy. ALL LTGS DUE 06/12/21    Time 8    Period Weeks    Status New      PT LONG TERM GOAL #2   Title Pt will improve FGA score to at least a 23/30 in order to demo decr fall risk.    Baseline 17/30    Time 8    Period Weeks    Status New      PT LONG TERM GOAL #3   Title Pt will improve gait speed with no AD to at least 3.2 ft/sec in order to demo improved community mobility.    Baseline 2.66 ft/sec with no AD    Time 8    Period Weeks    Status New      PT LONG TERM GOAL #4   Title Pt will improve FOTO score to at least a 65 in order to demo improved functional outcomes.    Baseline 53    Time 8    Period Weeks    Status New      PT LONG TERM GOAL #5   Title Pt will demo at least 750 feet with st. cane in 6 minutes to improve community ambulation    Baseline 571feet  st. cane (04/17/21)    Time 8    Period Weeks    Status New              05/13/21 1113  Plan  Clinical Impression Statement Today's skilled session focused on progress toward STGs with 3/5 goals fully met and 2/5 goals partially met. The pt is progressing toward goals and should benefit from continued PT to progress toward unmet goals.  Personal Factors and Comorbidities Comorbidity 3+;Past/Current Experience;Time since onset of injury/illness/exacerbation (pt with $40 co pay per discipline)  Comorbidities history of hypertension, hyperlipidemia, type 2 diabetes, anxiety and depression, history of a stroke in 2017, Angioedema  Examination-Activity Limitations Stairs;Transfers;Locomotion Level  Examination-Participation Restrictions Community Activity;Driving  Pt will benefit from skilled therapeutic intervention in order to improve on the following deficits Abnormal gait;Decreased balance;Difficulty walking;Dizziness;Decreased strength;Impaired sensation;Postural dysfunction;Pain  Stability/Clinical Decision Making Evolving/Moderate complexity  Rehab Potential Good  PT Frequency 1x / week  PT Duration 8 weeks  PT Treatment/Interventions ADLs/Self Care Home Management;Gait training;Stair training;Functional mobility training;Therapeutic activities;Therapeutic exercise;Neuromuscular re-education;Balance training;DME Instruction;Patient/family education;Vestibular;Visual/perceptual remediation/compensation;Moist Heat;Aquatic Therapy;Orthotic Fit/Training;Manual techniques;Passive range of motion;Energy conservation;Joint Manipulations;Spinal Manipulations  PT Next Visit Plan work on SLS activities, dynamic gait with no AD, balance on compliant surfaces with vision removed and head motions.  PT Home Exercise Plan [id number]  Consulted and Agree with Plan of Care Patient          Patient will benefit from skilled therapeutic intervention in order to improve the following deficits and  impairments:  Abnormal gait, Decreased balance, Difficulty walking, Dizziness, Decreased strength, Impaired sensation, Postural dysfunction, Pain  Visit Diagnosis: Muscle weakness (generalized)  Other abnormalities of gait and mobility  Unsteadiness on feet     Problem List Patient Active Problem List   Diagnosis Date Noted   Thalamic pain syndrome (hyperesthetic) 04/16/2021   Cerebral thrombosis with cerebral infarction 03/13/2021   Acute encephalopathy 03/12/2021   Bacteremia    Septic embolism ([place]) 02/10/2021   Subacute endocarditis 02/10/2021   Malnutrition of moderate degree 02/09/2021   Acute ischemic stroke ([place]) 02/08/2021   Infarction of right thalamus ([place]) 02/07/2021   Essential hypertension 02/07/2021   Paresthesia 02/02/2021   Gait abnormality 02/02/2021   Confusion 05/08/2018   Stroke ([place]) 03/17/2017   Type 2 diabetes mellitus with hyperlipidemia ([place]) 03/17/2017   Confusion state 02/16/2017   Angioedema 02/16/2017   Panic 02/04/2017   Adjustment disorder with anxious mood 02/04/2017   Sinusitis, chronic 12/28/2011   Rhinitis, allergic 12/28/2011   Extrinsic asthma 12/07/2011    Willow Ora, PTA, [facility] [address] [place], [place] [address] [phone] 05/14/21, 10:16 PM   Name: Sheila Guerrero MRN: [id number] Date of Birth: April 29, 1961

## 2021-05-19 ENCOUNTER — Other Ambulatory Visit: Payer: Self-pay

## 2021-05-19 ENCOUNTER — Encounter: Payer: Self-pay | Admitting: Cardiology

## 2021-05-19 ENCOUNTER — Ambulatory Visit (INDEPENDENT_AMBULATORY_CARE_PROVIDER_SITE_OTHER): Payer: 59 | Admitting: Cardiology

## 2021-05-19 VITALS — BP 138/82 | HR 86 | Ht 65.0 in | Wt 181.0 lb

## 2021-05-19 DIAGNOSIS — I10 Essential (primary) hypertension: Secondary | ICD-10-CM

## 2021-05-19 DIAGNOSIS — I5022 Chronic systolic (congestive) heart failure: Secondary | ICD-10-CM

## 2021-05-19 DIAGNOSIS — E1169 Type 2 diabetes mellitus with other specified complication: Secondary | ICD-10-CM | POA: Diagnosis not present

## 2021-05-19 DIAGNOSIS — I6350 Cerebral infarction due to unspecified occlusion or stenosis of unspecified cerebral artery: Secondary | ICD-10-CM | POA: Diagnosis not present

## 2021-05-19 DIAGNOSIS — E785 Hyperlipidemia, unspecified: Secondary | ICD-10-CM

## 2021-05-19 MED ORDER — SPIRONOLACTONE 25 MG PO TABS: 12.5000 mg | ORAL_TABLET | Freq: Every day | ORAL | 3 refills | Status: DC

## 2021-05-19 MED ORDER — ENTRESTO 49-51 MG PO TABS: 1.0000 | ORAL_TABLET | Freq: Two times a day (BID) | ORAL | 3 refills | Status: DC

## 2021-05-19 NOTE — Patient Instructions (Signed)
Continue Coreg at current dose  We will increase Entresto to 49/51 mg twice a day  Add Aldactone 12.5 mg once a day  We will check lab work in one week  Follow up with our Pharm D in 3-4 weeks.  I will see you in 2 months.

## 2021-05-20 ENCOUNTER — Ambulatory Visit: Payer: 59 | Admitting: Physical Therapy

## 2021-05-20 ENCOUNTER — Ambulatory Visit: Payer: 59 | Admitting: Occupational Therapy

## 2021-05-20 DIAGNOSIS — R41842 Visuospatial deficit: Secondary | ICD-10-CM

## 2021-05-20 DIAGNOSIS — R41840 Attention and concentration deficit: Secondary | ICD-10-CM

## 2021-05-20 DIAGNOSIS — R2689 Other abnormalities of gait and mobility: Secondary | ICD-10-CM

## 2021-05-20 DIAGNOSIS — M6281 Muscle weakness (generalized): Secondary | ICD-10-CM

## 2021-05-20 DIAGNOSIS — R278 Other lack of coordination: Secondary | ICD-10-CM

## 2021-05-20 DIAGNOSIS — R41844 Frontal lobe and executive function deficit: Secondary | ICD-10-CM

## 2021-05-20 DIAGNOSIS — R2681 Unsteadiness on feet: Secondary | ICD-10-CM

## 2021-05-20 NOTE — Patient Instructions (Signed)
Access Code: [email or web address] URL: [email or web address] Date: 05/20/2021 Prepared by: Waldo Laine  Exercises Standing Elbow Extension with Self-Anchored Resistance - 1 x daily - 7 x weekly - 3 sets - 10 reps Standing Elbow Flexion with Self-Anchored Resistance - 1 x daily - 7 x weekly - 3 sets - 10 reps Standing Shoulder Horizontal Abduction with Resistance - 1 x daily - 7 x weekly - 3 sets - 10 reps Standing Shoulder Flexion with Resistance - 1 x daily - 7 x weekly - 3 sets - 10 reps Standing Shoulder Diagonal Horizontal Abduction 60/120 Degrees with Resistance - 1 x daily - 7 x weekly - 3 sets - 10 reps Standing Single Arm Shoulder Abduction with Resistance - 1 x daily - 7 x weekly - 3 sets - 10 reps Single Arm Punch with Resistance - 1 x daily - 7 x weekly - 3 sets - 10 reps

## 2021-05-20 NOTE — Therapy (Signed)
[facility] [address] [place], [name], [address] Phone: [phone]   Fax:  [phone]  Occupational Therapy Treatment  Patient Details  Name: Sheila Guerrero MRN: [id number] Date of Birth: [birth year] Referring Provider (OT): Dr. Nigel Bridgeman   Encounter Date: 05/20/2021   OT End of Session - 05/20/21 1148     Visit Number 8    Number of Visits 13    Date for OT Re-Evaluation 06/19/21    Authorization Time Period POC written for 12 weks anticipate d/c after 8 weeks    Authorization - Visit Number 8    Authorization - Number of Visits 32    OT Start Time 1147    OT Stop Time 1230    OT Time Calculation (min) 43 min    Activity Tolerance Patient tolerated treatment well    Behavior During Therapy [facility] for tasks assessed/performed             Past Medical History:  Diagnosis Date   Angioedema    Anxiety    Asthma    Depression    Diabetes mellitus, type II ([place])    Dyslipidemia    Family history of adverse reaction to anesthesia    mother severely confused after general anesthesia   History of CVA (cerebrovascular accident)    HTN (hypertension) 2013   Panic attacks    Pneumonia    Septic embolism ([place]) 02/10/2021   Situational stress    Subacute endocarditis 02/10/2021    Past Surgical History:  Procedure Laterality Date   BUBBLE STUDY  02/10/2021   Procedure: BUBBLE STUDY;  Surgeon: Dixie Dials, MD;  Location: [place];  Service: Cardiovascular;;   CHOLECYSTECTOMY  2010   TEE WITHOUT CARDIOVERSION N/A 02/10/2021   Procedure: TRANSESOPHAGEAL ECHOCARDIOGRAM (TEE);  Surgeon: Dixie Dials, MD;  Location: [facility] ENDOSCOPY;  Service: Cardiovascular;  Laterality: N/A;   TEE WITHOUT CARDIOVERSION N/A 03/27/2021   Procedure: TRANSESOPHAGEAL ECHOCARDIOGRAM (TEE);  Surgeon: Dixie Dials, MD;  Location: [facility] ENDOSCOPY;  Service: Cardiovascular;  Laterality: N/A;    There were no vitals filed for this  visit.   Subjective Assessment - 05/20/21 1148     Subjective  Pt reports about a 4 in pain today "it doesn't go away" but pt was unable to say that it gets better.    Pertinent History 60 year old female with past medical history of angioedema, hypertension, hyperlipidemia, type 2 diabetes, anxiety, depression, type 2 diabetes, CVA in 2017, new CVA March 02/07/21    Currently in Pain? Yes    Pain Score 4     Pain Location Other (Comment)   whole left side   Pain Orientation Left    Pain Descriptors / Indicators Other (Comment)   "raw"   Pain Type Neuropathic pain    Pain Onset More than a month ago    Pain Frequency Constant                Constant Therapy Alternating Symbol level 6 with 97% accuracy and 39.03s response time. Alternating Words (uppercase/lowercase) level 1 with 98% accuracy and 43.28s response time. Pt reported difficulty with attention with other external stimuli.  Yellow Theraband  Grooved Pegs grabbing a few and translating one peg at a time to fingertips to place in grooved pegboard with LUE with min drops and min difficulty.                 OT Education - 05/20/21 1216     Education Details  yellow theraband    Person(s) Educated Patient    Methods Explanation;Demonstration;Verbal cues;Handout    Comprehension Verbalized understanding;Returned demonstration;Verbal cues required;Need further instruction              OT Short Term Goals - 05/13/21 1200       OT SHORT TERM GOAL #1   Title I with inital HEP (including coordination)    Time 6    Period Weeks    Status Achieved   pt reports not doing anything at home right now 04/17/21   Target Date 05/01/21   updated at renewal 04/17/21     OT [place] #2   Title Pt will verbalize understanding of compensatory strategies for visual and cognitive deficits.    Time 4    Period Weeks    Status Achieved      OT SHORT TERM GOAL #3   Title Pt will perform basic cooking and home  management modified independently demonstrating good safety awareness.    Time 4    Period Weeks    Status Achieved   scrambled eggs in clinic with mod I     OT SHORT TERM GOAL #4   Title I with sensory precautions for LUE and will demonstrate understanding of compensations during functional activity.    Time 4    Period Weeks    Status Achieved      OT SHORT TERM GOAL #5   Title Pt will locate items in a mod distracting environment with 90% or better accuracy in prep for driving/ grocery shopping.    Time 4    Period Weeks    Status Achieved   94% with environmental scanning in therapy gym.     OT SHORT TERM GOAL #6   Title Pt will demonstrate improved fine motor coordination as evidenced by decreasing 9 hole peg test score to 35 secs or less with LUE.    Baseline RUE 24.53, LUE 39.22    Time 4    Period Weeks    Status Achieved   L 32.12s 05/13/21     OT SHORT TERM GOAL #7   Title I with bed positioning to minimize LUE pain at night time.    Time 4    Period Weeks    Status Achieved   handout issued              OT Long Term Goals - 05/13/21 1222       OT LONG TERM GOAL #1   Title I with updated HEP for LUE strength.    Time 12    Period Weeks    Status New      OT LONG TERM GOAL #2   Title Pt will demonstrate ability to carry a cup of water in 1 hand and a plate in the other without drops or spills.    Time 12    Period Weeks    Status Achieved   Pt walked approx 50 ft while carrying without drops.     OT LONG TERM GOAL #3   Title Pt will increase LUE grip strength to 36 lbs or greater for increased functional use.    Time 12    Period Weeks    Status Revised   after recent stroke after 4/13 session - L 26.4lbs     OT LONG TERM GOAL #4   Title Pt will perform a physical and cogntive task simultaneously with no LOB and 90% or better accuracy.  Time 12    Period Weeks    Status New      OT LONG TERM GOAL #5   Title Pt will complete FOTO survey at  d/c.    Time 12    Period Weeks    Status New      OT LONG TERM GOAL #6   Title Pt will menu plan, shop(online or in store) and cook a mod complex meal from start to finish modified independently.    Time 12    Period Weeks    Status On-going   pt reports doing this consistently 05/13/21     OT LONG TERM GOAL #7   Title Pt will verbalize recommendations regarding return to driving including driving eval prn.    Time 12    Period Weeks    Status New                   Plan - 05/20/21 1310     Clinical Impression Statement Pt is progressing towards all unmet goals. Pt with good cooridnation and strength today.    OT Occupational Profile and History Detailed Assessment- Review of Records and additional review of physical, cognitive, psychosocial history related to current functional performance    Occupational performance deficits (Please refer to evaluation for details): ADL's;IADL's;Leisure;Social Participation    Body Structure / Function / Physical Skills ADL;Endurance;UE functional use;Balance;Vision;Pain;Flexibility;FMC;ROM;Gait;GMC;Sensation;Decreased knowledge of precautions;Decreased knowledge of use of DME;IADL;Strength;Dexterity    Cognitive Skills Attention;Memory;Problem Solve;Safety Awareness;Sequencing;Temperament/Personality;Thought;Understand    Rehab Potential Good    Clinical Decision Making Limited treatment options, no task modification necessary    Comorbidities Affecting Occupational Performance: May have comorbidities impacting occupational performance    Modification or Assistance to Complete Evaluation  No modification of tasks or assist necessary to complete eval    OT Frequency 1x / week   plus eval   OT Duration Other (comment)   9 weeks since renewal on 5/20   OT Treatment/Interventions Self-care/ADL training;Energy conservation;Visual/perceptual remediation/compensation;Patient/family education;DME and/or AE instruction;Paraffin;Passive range of  motion;Balance training;Fluidtherapy;Cryotherapy;Therapist, nutritional;Therapeutic activities;Manual Therapy;Therapeutic exercise;Moist Heat;Neuromuscular education;Cognitive remediation/compensation    Plan physical/cognitive, grip strength    Consulted and Agree with Plan of Care Patient             Patient will benefit from skilled therapeutic intervention in order to improve the following deficits and impairments:   Body Structure / Function / Physical Skills: ADL, Endurance, UE functional use, Balance, Vision, Pain, Flexibility, FMC, ROM, Gait, GMC, Sensation, Decreased knowledge of precautions, Decreased knowledge of use of DME, IADL, Strength, Dexterity Cognitive Skills: Attention, Memory, Problem Solve, Safety Awareness, Sequencing, Temperament/Personality, Thought, Understand     Visit Diagnosis: Muscle weakness (generalized)  Attention and concentration deficit  Frontal lobe and executive function deficit  Other lack of coordination  Unsteadiness on feet  Other abnormalities of gait and mobility  Visuospatial deficit    Problem List Patient Active Problem List   Diagnosis Date Noted   Thalamic pain syndrome (hyperesthetic) 04/16/2021   Cerebral thrombosis with cerebral infarction 03/13/2021   Acute encephalopathy 03/12/2021   Bacteremia    Malnutrition of moderate degree 02/09/2021   Acute ischemic stroke ([place]) 02/08/2021   Infarction of right thalamus ([place]) 02/07/2021   Essential hypertension 02/07/2021   Paresthesia 02/02/2021   Gait abnormality 02/02/2021   Confusion 05/08/2018   Stroke ([place]) 03/17/2017   Type 2 diabetes mellitus with hyperlipidemia ([place]) 03/17/2017   Confusion state 02/16/2017   Angioedema 02/16/2017   Panic 02/04/2017  Adjustment disorder with anxious mood 02/04/2017   Sinusitis, chronic 12/28/2011   Rhinitis, allergic 12/28/2011   Extrinsic asthma 12/07/2011    Zachery Conch MOT, OTR/L  05/20/2021, 1:10  PM  [facility] [address] [place], [name], [address] Phone: [phone]   Fax:  [phone]  Name: Sheila Guerrero MRN: [id number] Date of Birth: 1961-02-19

## 2021-05-20 NOTE — Therapy (Signed)
[facility] [address] [place], [name], [address] Phone: [phone]   Fax:  [phone]  Physical Therapy Treatment  Patient Details  Name: Sheila Guerrero MRN: [id number] Date of Birth: 01-08-1961 Referring Provider (PT): Barb Merino (not reffered by, but previous neurologist has been Dr. Krista Blue)   Encounter Date: 05/20/2021   PT End of Session - 05/20/21 1404     Visit Number 7    Number of Visits 12    Date for PT Re-Evaluation 06/12/21    Authorization Type [employer] - $40 co pay per discipline; Recert from 8/93/73 to 06/12/21 (1x/week for 8 sessions)    PT Start Time 1105    PT Stop Time 1145    PT Time Calculation (min) 40 min    Equipment Utilized During Treatment Gait belt    Activity Tolerance Patient tolerated treatment well    Behavior During Therapy WFL for tasks assessed/performed             Past Medical History:  Diagnosis Date   Angioedema    Anxiety    Asthma    Depression    Diabetes mellitus, type II ([place])    Dyslipidemia    Family history of adverse reaction to anesthesia    mother severely confused after general anesthesia   History of CVA (cerebrovascular accident)    HTN (hypertension) 2013   Panic attacks    Pneumonia    Septic embolism ([place]) 02/10/2021   Situational stress    Subacute endocarditis 02/10/2021    Past Surgical History:  Procedure Laterality Date   BUBBLE STUDY  02/10/2021   Procedure: BUBBLE STUDY;  Surgeon: Dixie Dials, MD;  Location: [facility];  Service: Cardiovascular;;   CHOLECYSTECTOMY  2010   TEE WITHOUT CARDIOVERSION N/A 02/10/2021   Procedure: TRANSESOPHAGEAL ECHOCARDIOGRAM (TEE);  Surgeon: Dixie Dials, MD;  Location: [facility] ENDOSCOPY;  Service: Cardiovascular;  Laterality: N/A;   TEE WITHOUT CARDIOVERSION N/A 03/27/2021   Procedure: TRANSESOPHAGEAL ECHOCARDIOGRAM (TEE);  Surgeon: Dixie Dials, MD;  Location: [facility] ENDOSCOPY;  Service: Cardiovascular;   Laterality: N/A;    There were no vitals filed for this visit.   Subjective Assessment - 05/20/21 1118     Subjective Nothing new to report. Pt states she's been walking 8 minutes outside and then 8 minutes coming back with her neighbor (She notes that the ground is fairly level). No falls noted. She reports she's been working on her HEP.    Pertinent History history of hypertension, hyperlipidemia, type 2 diabetes on Metformin at home, anxiety and depression, history of a stroke in 2017, Angioedema    Limitations Walking    Diagnostic tests MRI showed small acute to early subacute infarct in the right  thalamus/posterior limb of internal capsule and small subacute left occipital lobe infarct.    Patient Stated Goals wants to know what she can do and shouldn't do    Currently in Pain? No/denies    Pain Onset More than a month ago                               [facility] Adult PT Treatment/Exercise - 05/20/21 0001       Transfers   Sit to Stand 7: Independent    Stand to Sit 7: Independent      Ambulation/Gait   Ambulation/Gait Assistance 5: Supervision;4: Min guard    Ambulation/Gait Assistance Details 1 episode of LOB (toe catching);  pt able to self correct    Ambulation Distance (Feet) 575 Feet    Assistive device None    Gait Pattern Step-through pattern;Decreased step length - left;Decreased step length - right;Abducted - left    Ambulation Surface Level;Indoor;Outdoor;Grass;Unlevel    Gait Comments Fatigued quickly walking on grass x 100' outside; no issues with 6 min walking for warm-up      Exercises   Exercises Ankle      Ankle Exercises: Stretches   Gastroc Stretch 30 seconds      Ankle Exercises: Standing   Heel Raises 20 reps    Toe Raise 20 reps                 Balance Exercises - 05/20/21 0001       Balance Exercises: Standing   Standing Eyes Closed Wide (BOA);Head turns;Foam/compliant surface;30 secs;Narrow base of support (BOS)    standing on 2 pillows: head turns 2x30 sec, head nods 2x30 sec; unable to perform with feet together   SLS with Vectors Solid surface;Intermittent upper extremity assist   x10 tapping forward   Rockerboard Anterior/posterior;Lateral;EO;30 seconds;Intermittent UE support   2x30 sec holding center; x10 ant/post, x10 side<>side no UE support   Tandem Gait Forward;Intermittent upper extremity support;4 reps   4x10'; 5 sec hold in tandem stance   Retro Gait Upper extremity support;4 reps   4x10'; cues for increased step length                PT Short Term Goals - 05/13/21 1113       PT SHORT TERM GOAL #1   Title Pt will improve FGA score to at least a 20/30 in order to demo decr fall risk. ALL STGS DUE 05/15/21    Baseline 05/13/21: 17/30, improved from 13/30 just not to goal    Time --    Period --    Status Partially Met    Target Date 05/15/21      PT SHORT TERM GOAL #2   Title Pt will improve gait speed with no AD to at least 2.9 ft/sec in order to demo improved community mobility.    Baseline 05/13/21: 2.68 ft/sec with cane, improved from 1.18 ft/sec just not to goal    Time --    Period --    Status Partially Met    Target Date 05/15/21      PT SHORT TERM GOAL #3   Title Pt will improve mCTSIB condition 4 to at least 15 seconds in order to demo improved vestibular input for balance.    Baseline 05/13/21: met in session today    Time --    Period --    Status Achieved      PT SHORT TERM GOAL #4   Title Pt will demo compliance with walking program to improve walking endurance    Baseline 05/13/21: has a walking program, up to 8 minutes a day    Status Achieved    Target Date 05/15/21      PT SHORT TERM GOAL #5   Title Pt will demo at least 150 feet more in 6 minutes with a cane to improve walking endurance    Baseline 05/13/21: 961 feet (increased from 500 feet at last assessment)    Time --    Status Achieved    Target Date --               PT Long Term Goals -  05/07/21 7902  PT LONG TERM GOAL #1   Title Pt will be independent with final HEP in order to build upon functional gains made in therapy. ALL LTGS DUE 06/12/21    Time 8    Period Weeks    Status New      PT LONG TERM GOAL #2   Title Pt will improve FGA score to at least a 23/30 in order to demo decr fall risk.    Baseline 17/30    Time 8    Period Weeks    Status New      PT LONG TERM GOAL #3   Title Pt will improve gait speed with no AD to at least 3.2 ft/sec in order to demo improved community mobility.    Baseline 2.66 ft/sec with no AD    Time 8    Period Weeks    Status New      PT LONG TERM GOAL #4   Title Pt will improve FOTO score to at least a 65 in order to demo improved functional outcomes.    Baseline 53    Time 8    Period Weeks    Status New      PT LONG TERM GOAL #5   Title Pt will demo at least 750 feet with st. cane in 6 minutes to improve community ambulation    Baseline 557fet st. cane (04/17/21)    Time 8    Period Weeks    Status New                   Plan - 05/20/21 1401     Clinical Impression Statement Treatment focused on reviewing HEP and modifying as needed. When distracted pt with increased LOBs/L toe catching (pt able to self correct). Pt demos increased fatigue when ambulating on unlevel surfaces. Worked on improving L weight shift and single leg stance as well as L foot coordination (pt reports continued difficulty trying to obtain L foot forward into tandem stance).    Personal Factors and Comorbidities Comorbidity 3+;Past/Current Experience;Time since onset of injury/illness/exacerbation   pt with $40 co pay per discipline   Comorbidities history of hypertension, hyperlipidemia, type 2 diabetes, anxiety and depression, history of a stroke in 2017, Angioedema    Examination-Activity Limitations Stairs;Transfers;Locomotion Level    Examination-Participation Restrictions Community Activity;Driving    Stability/Clinical  Decision Making Evolving/Moderate complexity    Rehab Potential Good    PT Frequency 1x / week    PT Duration 8 weeks    PT Treatment/Interventions ADLs/Self Care Home Management;Gait training;Stair training;Functional mobility training;Therapeutic activities;Therapeutic exercise;Neuromuscular re-education;Balance training;DME Instruction;Patient/family education;Vestibular;Visual/perceptual remediation/compensation;Moist Heat;Aquatic Therapy;Orthotic Fit/Training;Manual techniques;Passive range of motion;Energy conservation;Joint Manipulations;Spinal Manipulations    PT Next Visit Plan work on SLS activities, dynamic gait with no AD, balance on compliant surfaces with vision removed and head motions.    PT Home Exercise Plan [id number]; pt to walk ~1 min on grass on her daily walks with neighbor    Consulted and Agree with Plan of Care Patient             Patient will benefit from skilled therapeutic intervention in order to improve the following deficits and impairments:  Abnormal gait, Decreased balance, Difficulty walking, Dizziness, Decreased strength, Impaired sensation, Postural dysfunction, Pain  Visit Diagnosis: Muscle weakness (generalized)  Frontal lobe and executive function deficit  Other lack of coordination  Visuospatial deficit  Unsteadiness on feet  Other abnormalities of gait and mobility     Problem List Patient  Active Problem List   Diagnosis Date Noted   Thalamic pain syndrome (hyperesthetic) 04/16/2021   Cerebral thrombosis with cerebral infarction 03/13/2021   Acute encephalopathy 03/12/2021   Bacteremia    Malnutrition of moderate degree 02/09/2021   Acute ischemic stroke ([place]) 02/08/2021   Infarction of right thalamus ([place]) 02/07/2021   Essential hypertension 02/07/2021   Paresthesia 02/02/2021   Gait abnormality 02/02/2021   Confusion 05/08/2018   Stroke ([place]) 03/17/2017   Type 2 diabetes mellitus with hyperlipidemia ([place]) 03/17/2017    Confusion state 02/16/2017   Angioedema 02/16/2017   Panic 02/04/2017   Adjustment disorder with anxious mood 02/04/2017   Sinusitis, chronic 12/28/2011   Rhinitis, allergic 12/28/2011   Extrinsic asthma 12/07/2011    [facility] [address] [place] PT, DPT 05/20/2021, 2:05 PM  [facility] [address] [place], [name], [address] Phone: [phone]   Fax:  [phone]  Name: Sheila Guerrero MRN: [id number] Date of Birth: 1961-09-30

## 2021-05-27 ENCOUNTER — Ambulatory Visit: Payer: 59

## 2021-05-27 ENCOUNTER — Ambulatory Visit: Payer: 59 | Admitting: Occupational Therapy

## 2021-06-02 ENCOUNTER — Ambulatory Visit: Payer: 59 | Admitting: Neurology

## 2021-06-02 ENCOUNTER — Encounter: Payer: 59 | Admitting: Physical Medicine & Rehabilitation

## 2021-06-03 ENCOUNTER — Encounter: Payer: Self-pay | Admitting: Occupational Therapy

## 2021-06-03 ENCOUNTER — Ambulatory Visit: Payer: 59 | Admitting: Occupational Therapy

## 2021-06-03 ENCOUNTER — Encounter: Payer: Self-pay | Admitting: Physical Therapy

## 2021-06-03 ENCOUNTER — Ambulatory Visit: Payer: 59 | Attending: Internal Medicine | Admitting: Physical Therapy

## 2021-06-03 ENCOUNTER — Other Ambulatory Visit: Payer: Self-pay

## 2021-06-03 VITALS — BP 119/76 | HR 100

## 2021-06-03 DIAGNOSIS — R278 Other lack of coordination: Secondary | ICD-10-CM

## 2021-06-03 DIAGNOSIS — R41844 Frontal lobe and executive function deficit: Secondary | ICD-10-CM | POA: Insufficient documentation

## 2021-06-03 DIAGNOSIS — R41840 Attention and concentration deficit: Secondary | ICD-10-CM | POA: Insufficient documentation

## 2021-06-03 DIAGNOSIS — R2681 Unsteadiness on feet: Secondary | ICD-10-CM | POA: Diagnosis present

## 2021-06-03 DIAGNOSIS — R2689 Other abnormalities of gait and mobility: Secondary | ICD-10-CM | POA: Insufficient documentation

## 2021-06-03 DIAGNOSIS — R41842 Visuospatial deficit: Secondary | ICD-10-CM | POA: Insufficient documentation

## 2021-06-03 DIAGNOSIS — M6281 Muscle weakness (generalized): Secondary | ICD-10-CM

## 2021-06-03 NOTE — Therapy (Signed)
[facility] [address] [place], [name], [address] Phone: [phone]   Fax:  [phone]  Occupational Therapy Treatment  Patient Details  Name: Sheila Guerrero MRN: [id number] Date of Birth: March 15, 1961 Referring Provider (OT): Dr. Nigel Bridgeman   Encounter Date: 06/03/2021   OT End of Session - 06/03/21 1149     Visit Number 9    Number of Visits 13    Date for OT Re-Evaluation 06/19/21    Authorization Time Period POC written for 12 weks anticipate d/c after 8 weeks    Authorization - Visit Number 8    Authorization - Number of Visits 57    OT Start Time 7408    OT Stop Time 1230    OT Time Calculation (min) 45 min    Activity Tolerance Patient tolerated treatment well    Behavior During Therapy [facility] for tasks assessed/performed             Past Medical History:  Diagnosis Date   Angioedema    Anxiety    Asthma    Depression    Diabetes mellitus, type II ([place])    Dyslipidemia    Family history of adverse reaction to anesthesia    mother severely confused after general anesthesia   History of CVA (cerebrovascular accident)    HTN (hypertension) 2013   Panic attacks    Pneumonia    Septic embolism ([place]) 02/10/2021   Situational stress    Subacute endocarditis 02/10/2021    Past Surgical History:  Procedure Laterality Date   BUBBLE STUDY  02/10/2021   Procedure: BUBBLE STUDY;  Surgeon: Dixie Dials, MD;  Location: [facility];  Service: Cardiovascular;;   CHOLECYSTECTOMY  2010   TEE WITHOUT CARDIOVERSION N/A 02/10/2021   Procedure: TRANSESOPHAGEAL ECHOCARDIOGRAM (TEE);  Surgeon: Dixie Dials, MD;  Location: [facility] ENDOSCOPY;  Service: Cardiovascular;  Laterality: N/A;   TEE WITHOUT CARDIOVERSION N/A 03/27/2021   Procedure: TRANSESOPHAGEAL ECHOCARDIOGRAM (TEE);  Surgeon: Dixie Dials, MD;  Location: [facility] ENDOSCOPY;  Service: Cardiovascular;  Laterality: N/A;    There were no vitals filed for this  visit.   Subjective Assessment - 06/03/21 1147     Subjective  Pt denies any changes. Pt reports bad side effects from a new drug they started her on. S/s of dizziness. Pt stopped medication yesterday but still in system.    Pertinent History 60 year old female with past medical history of angioedema, hypertension, hyperlipidemia, type 2 diabetes, anxiety, depression, type 2 diabetes, CVA in 2017, new CVA March 02/07/21    Currently in Pain? Yes    Pain Score 5     Pain Location Other (Comment)   whole left side   Pain Orientation Left    Pain Descriptors / Indicators Other (Comment)   "raw"   Pain Type Neuropathic pain    Pain Onset More than a month ago    Pain Frequency Constant              Pt received from lobby.   Resistance Clothespins with LUE 1-8# with word finding items that are the color of the clothespins. Pt with increased difficulty with word finding as activity went on and with redirection back to activity but overall did with moderate difficulty.   Logic Links pt req'd moderate cues for understanding clues and mental flexibility.  Constant Therapy Alternating Symbols level 8 with 99% accuracy and 42.38s response time.  Hand Gripper: with LUE on level 2 with black spring. Pt picked up 1  inch blocks with gripper with mod drops and mod difficulty.                     OT Short Term Goals - 05/13/21 1200       OT SHORT TERM GOAL #1   Title I with inital HEP (including coordination)    Time 6    Period Weeks    Status Achieved   pt reports not doing anything at home right now 04/17/21   Target Date 05/01/21   updated at renewal 04/17/21     OT [place] #2   Title Pt will verbalize understanding of compensatory strategies for visual and cognitive deficits.    Time 4    Period Weeks    Status Achieved      OT SHORT TERM GOAL #3   Title Pt will perform basic cooking and home management modified independently demonstrating good safety  awareness.    Time 4    Period Weeks    Status Achieved   scrambled eggs in clinic with mod I     OT SHORT TERM GOAL #4   Title I with sensory precautions for LUE and will demonstrate understanding of compensations during functional activity.    Time 4    Period Weeks    Status Achieved      OT SHORT TERM GOAL #5   Title Pt will locate items in a mod distracting environment with 90% or better accuracy in prep for driving/ grocery shopping.    Time 4    Period Weeks    Status Achieved   94% with environmental scanning in therapy gym.     OT SHORT TERM GOAL #6   Title Pt will demonstrate improved fine motor coordination as evidenced by decreasing 9 hole peg test score to 35 secs or less with LUE.    Baseline RUE 24.53, LUE 39.22    Time 4    Period Weeks    Status Achieved   L 32.12s 05/13/21     OT SHORT TERM GOAL #7   Title I with bed positioning to minimize LUE pain at night time.    Time 4    Period Weeks    Status Achieved   handout issued              OT Long Term Goals - 06/03/21 1149       OT LONG TERM GOAL #1   Title I with updated HEP for LUE strength.    Time 12    Period Weeks    Status On-going   yellow theraband - reports going well     OT LONG TERM GOAL #2   Title Pt will demonstrate ability to carry a cup of water in 1 hand and a plate in the other without drops or spills.    Time 12    Period Weeks    Status Achieved   Pt walked approx 50 ft while carrying without drops.     OT LONG TERM GOAL #3   Title Pt will increase LUE grip strength to 36 lbs or greater for increased functional use.    Time 12    Period Weeks    Status Revised   after recent stroke after 4/13 session - L 26.4lbs     OT LONG TERM GOAL #4   Title Pt will perform a physical and cogntive task simultaneously with no LOB and 90% or better accuracy.  Time 12    Period Weeks    Status New      OT LONG TERM GOAL #5   Title Pt will complete FOTO survey at d/c.    Time 12     Period Weeks    Status New      OT LONG TERM GOAL #6   Title Pt will menu plan, shop(online or in store) and cook a mod complex meal from start to finish modified independently.    Time 12    Period Weeks    Status Achieved   pt reports doing this consistently 05/13/21     OT LONG TERM GOAL #7   Title Pt will verbalize recommendations regarding return to driving including driving eval prn.    Time 12    Period Weeks    Status New                    Patient will benefit from skilled therapeutic intervention in order to improve the following deficits and impairments:           Visit Diagnosis: No diagnosis found.    Problem List Patient Active Problem List   Diagnosis Date Noted   Thalamic pain syndrome (hyperesthetic) 04/16/2021   Cerebral thrombosis with cerebral infarction 03/13/2021   Acute encephalopathy 03/12/2021   Bacteremia    Malnutrition of moderate degree 02/09/2021   Acute ischemic stroke ([place]) 02/08/2021   Infarction of right thalamus ([place]) 02/07/2021   Essential hypertension 02/07/2021   Paresthesia 02/02/2021   Gait abnormality 02/02/2021   Confusion 05/08/2018   Stroke ([place]) 03/17/2017   Type 2 diabetes mellitus with hyperlipidemia ([place]) 03/17/2017   Confusion state 02/16/2017   Angioedema 02/16/2017   Panic 02/04/2017   Adjustment disorder with anxious mood 02/04/2017   Sinusitis, chronic 12/28/2011   Rhinitis, allergic 12/28/2011   Extrinsic asthma 12/07/2011    Zachery Conch MOT, OTR/L  06/03/2021, 12:04 PM  [facility] [address] [place], [name], [address] Phone: [phone]   Fax:  [phone]  Name: Sheila Guerrero MRN: [id number] Date of Birth: September 02, 1961

## 2021-06-03 NOTE — Therapy (Signed)
Sheila Guerrero [address] [place], [name], [address] Phone: [phone]   Fax:  [phone]  Physical Therapy Treatment  Patient Details  Name: Sheila Guerrero MRN: [id number] Date of Birth: Mar 09, 1961 Referring Provider (PT): Sheila Guerrero (not reffered by, but previous neurologist has been Dr. Krista Guerrero)   Encounter Date: 06/03/2021   PT End of Session - 06/03/21 1319     Visit Number 8    Number of Visits 12    Date for PT Re-Evaluation 06/12/21    Authorization Type [employer] - $40 co pay per discipline; Recert from 05/29/76 to 06/12/21 (1x/week for 8 sessions)    PT Start Time 1230    PT Stop Time 9390    PT Time Calculation (min) 43 min    Equipment Utilized During Treatment Gait belt    Activity Tolerance Patient tolerated treatment well    Behavior During Therapy WFL for tasks assessed/performed             Past Medical History:  Diagnosis Date   Angioedema    Anxiety    Asthma    Depression    Diabetes mellitus, type II ([place])    Dyslipidemia    Family history of adverse reaction to anesthesia    mother severely confused after general anesthesia   History of CVA (cerebrovascular accident)    HTN (hypertension) 2013   Panic attacks    Pneumonia    Septic embolism ([place]) 02/10/2021   Situational stress    Subacute endocarditis 02/10/2021    Past Surgical History:  Procedure Laterality Date   BUBBLE STUDY  02/10/2021   Procedure: BUBBLE STUDY;  Surgeon: Sheila Dials, MD;  Location: [facility];  Service: Cardiovascular;;   CHOLECYSTECTOMY  2010   TEE WITHOUT CARDIOVERSION N/A 02/10/2021   Procedure: TRANSESOPHAGEAL ECHOCARDIOGRAM (TEE);  Surgeon: Sheila Dials, MD;  Location: [facility] ENDOSCOPY;  Service: Cardiovascular;  Laterality: N/A;   TEE WITHOUT CARDIOVERSION N/A 03/27/2021   Procedure: TRANSESOPHAGEAL ECHOCARDIOGRAM (TEE);  Surgeon: Sheila Dials, MD;  Location: [facility] ENDOSCOPY;  Service: Cardiovascular;   Laterality: N/A;    Vitals:   06/03/21 1235  BP: 119/76  Pulse: 100     Subjective Assessment - 06/03/21 1232     Subjective Was taken off her spirnolocatone yesterday and feeling a bit dizzy today. No falls. No other changes.    Pertinent History history of hypertension, hyperlipidemia, type 2 diabetes on Metformin at home, anxiety and depression, history of a stroke in 2017, Angioedema    Limitations Walking    Diagnostic tests MRI showed small acute to early subacute infarct in the right  thalamus/posterior limb of internal capsule and small subacute left occipital lobe infarct.    Patient Stated Goals wants to know what she can do and shouldn't do    Currently in Pain? Yes    Pain Score 5     Pain Location --   whole L side   Pain Orientation Left    Pain Descriptors / Indicators Constant   "pressure related"   Pain Type Neuropathic pain    Pain Onset More than a month ago                               Sheila Guerrero [facility] Adult PT Treatment/Exercise - 06/03/21 1321       Ambulation/Gait   Ambulation/Gait Yes    Ambulation/Gait Assistance 5: Supervision    Ambulation/Gait Assistance Details between  session with no AD    Assistive device None    Gait Pattern Step-through pattern;Decreased step length - left;Decreased step length - right;Abducted - left    Ambulation Surface Level;Indoor                 Balance Exercises - 06/03/21 1258       Balance Exercises: Standing   Standing Eyes Closed Foam/compliant surface;Limitations    Standing Eyes Closed Limitations slight space between feet 3 x 30 seconds, wider BOS eyes closed 2 x 5 reps head turns, 2 x 5 reps head nods - needing UE support as needed with head motions    SLS with Vectors Foam/compliant surface;Intermittent upper extremity assist;Limitations;Upper extremity assist 1    SLS with Vectors Limitations standing on red mat alternating toe taps to cones - x12 reps B, beginning with UE support and  then none min guard/min A    Stepping Strategy Posterior;Foam/compliant surface;Anterior;Limitations;10 reps    Stepping Strategy Limitations on Guerrero foam beam x10 reps each direction, alternating, incr difficulty with stepping forwards with needing intermittent UE support    Rockerboard Lateral;Head turns;EO;Limitations    Rockerboard Limitations using mirror as visual cue weight shifting x10 reps, x10 reps head turns, x10 reps head nods with using intermittent UE support for balance    Partial Tandem Stance Eyes open;2 reps;30 secs;Limitations    Partial Tandem Stance Limitations on Guerrero air ex in corner, performed with BLE, during 2nd set performed x3 reps B alternating UE raises    Other Standing Exercises on Guerrero/red mats next to countertop: marching down and back 3 reps, tandem gait down and back 3 reps - needing fingertip/UE support                 PT Short Term Goals - 05/13/21 1113       PT SHORT TERM GOAL #1   Title Pt will improve FGA score to at least a 20/30 in order to demo decr fall risk. ALL STGS DUE 05/15/21    Baseline 05/13/21: 17/30, improved from 13/30 just not to goal    Time --    Period --    Status Partially Met    Target Date 05/15/21      PT SHORT TERM GOAL #2   Title Pt will improve gait speed with no AD to at least 2.9 ft/sec in order to demo improved community mobility.    Baseline 05/13/21: 2.68 ft/sec with cane, improved from 1.18 ft/sec just not to goal    Time --    Period --    Status Partially Met    Target Date 05/15/21      PT SHORT TERM GOAL #3   Title Pt will improve mCTSIB condition 4 to at least 15 seconds in order to demo improved vestibular input for balance.    Baseline 05/13/21: met in session today    Time --    Period --    Status Achieved      PT SHORT TERM GOAL #4   Title Pt will demo compliance with walking program to improve walking endurance    Baseline 05/13/21: has a walking program, up to 8 minutes a day    Status  Achieved    Target Date 05/15/21      PT SHORT TERM GOAL #5   Title Pt will demo at least 150 feet more in 6 minutes with a cane to improve walking endurance    Baseline 05/13/21: 961 feet (increased  from 500 feet at last assessment)    Time --    Status Achieved    Target Date --               PT Long Term Goals - 05/07/21 9794       PT LONG TERM GOAL #1   Title Pt will be independent with final HEP in order to build upon functional gains made in therapy. ALL LTGS DUE 06/12/21    Time 8    Period Weeks    Status New      PT LONG TERM GOAL #2   Title Pt will improve FGA score to at least a 23/30 in order to demo decr fall risk.    Baseline 17/30    Time 8    Period Weeks    Status New      PT LONG TERM GOAL #3   Title Pt will improve gait speed with no AD to at least 3.2 ft/sec in order to demo improved community mobility.    Baseline 2.66 ft/sec with no AD    Time 8    Period Weeks    Status New      PT LONG TERM GOAL #4   Title Pt will improve FOTO score to at least a 65 in order to demo improved functional outcomes.    Baseline 53    Time 8    Period Weeks    Status New      PT LONG TERM GOAL #5   Title Pt will demo at least 750 feet with st. cane in 6 minutes to improve community ambulation    Baseline 517feet st. cane (04/17/21)    Time 8    Period Weeks    Status New                   Plan - 06/03/21 1322     Clinical Impression Statement Today's skilled session focused on balance strategies on compliant surfaces, SLS, and head motions. Pt with difficulty performing SLS activities without UE support esp on LLE, but did improve with incr reps. Will continue to progress towards LTGs.    Personal Factors and Comorbidities Comorbidity 3+;Past/Current Experience;Time since onset of injury/illness/exacerbation   pt with $40 co pay per discipline   Comorbidities history of hypertension, hyperlipidemia, type 2 diabetes, anxiety and depression, history  of a stroke in 2017, Angioedema    Examination-Activity Limitations Stairs;Transfers;Locomotion Level    Examination-Participation Restrictions Community Activity;Driving    Stability/Clinical Decision Making Evolving/Moderate complexity    Rehab Potential Good    PT Frequency 1x / week    PT Duration 8 weeks    PT Treatment/Interventions ADLs/Self Care Home Management;Gait training;Stair training;Functional mobility training;Therapeutic activities;Therapeutic exercise;Neuromuscular re-education;Balance training;DME Instruction;Patient/family education;Vestibular;Visual/perceptual remediation/compensation;Moist Heat;Aquatic Therapy;Orthotic Fit/Training;Manual techniques;Passive range of motion;Energy conservation;Joint Manipulations;Spinal Manipulations    PT Next Visit Plan LTGs due - pt will need re-cert. work on [employer] activities, dynamic gait with no AD, balance on compliant surfaces with vision removed and head motions.    PT Home Exercise Plan [id number]; pt to walk ~1 min on grass on her daily walks with neighbor    Consulted and Agree with Plan of Care Patient             Patient will benefit from skilled therapeutic intervention in order to improve the following deficits and impairments:  Abnormal gait, Decreased balance, Difficulty walking, Dizziness, Decreased strength, Impaired sensation, Postural dysfunction, Pain  Visit Diagnosis: Other lack  of coordination  Muscle weakness (generalized)  Unsteadiness on feet  Other abnormalities of gait and mobility     Problem List Patient Active Problem List   Diagnosis Date Noted   Thalamic pain syndrome (hyperesthetic) 04/16/2021   Cerebral thrombosis with cerebral infarction 03/13/2021   Acute encephalopathy 03/12/2021   Bacteremia    Malnutrition of moderate degree 02/09/2021   Acute ischemic stroke ([place]) 02/08/2021   Infarction of right thalamus ([place]) 02/07/2021   Essential hypertension 02/07/2021   Paresthesia 02/02/2021    Gait abnormality 02/02/2021   Confusion 05/08/2018   Stroke ([place]) 03/17/2017   Type 2 diabetes mellitus with hyperlipidemia ([place]) 03/17/2017   Confusion state 02/16/2017   Angioedema 02/16/2017   Panic 02/04/2017   Adjustment disorder with anxious mood 02/04/2017   Sinusitis, chronic 12/28/2011   Rhinitis, allergic 12/28/2011   Extrinsic asthma 12/07/2011    Winfred Iiams N Kamal Jurgens,PT,DPT 06/03/2021, 1:29 PM  [facility] [place] [address] [place], [name], [address] Phone: [phone]   Fax:  [phone]  Name: Sheila Guerrero MRN: [id number] Date of Birth: 1960/12/09

## 2021-06-10 ENCOUNTER — Ambulatory Visit: Payer: 59 | Admitting: Physical Therapy

## 2021-06-10 ENCOUNTER — Ambulatory Visit: Payer: 59 | Admitting: Occupational Therapy

## 2021-06-10 ENCOUNTER — Other Ambulatory Visit: Payer: Self-pay | Admitting: Neurology

## 2021-06-15 ENCOUNTER — Telehealth: Payer: Self-pay | Admitting: Cardiology

## 2021-06-15 NOTE — Telephone Encounter (Signed)
Pt c/o medication issue:  1. Name of Medication: spironolactone (ALDACTONE) 25 MG tablet  2. How are you currently taking this medication (dosage and times per day)? Not currently taking  3. Are you having a reaction (difficulty breathing--STAT)? no  4. What is your medication issue? Patient the medication made her have the following side effects: dizziness, lost balance and fell, problems getting out of bed, sitting up was a problem after laying down. She states she thought she had an inner ear infection, but realized it is the medication.

## 2021-06-15 NOTE — Telephone Encounter (Signed)
Called patient, she was started on Spiro at the most recent visit. She states that a few days after starting it she noticed the symptoms below, dizziness, loss of balance, problems getting out of bed. She thought she had some inner ear or vertigo, but she took Meclizine and it did not help at all. She then reviewed the side effects of the medication and noticed it was the same ones she was having. She then stopped the medication and it got better, but then tired to restart it with the same side effects. Patient was advised I would route to MD to make aware and would call back with recommendations.  Patient verbalized understanding.

## 2021-06-16 ENCOUNTER — Ambulatory Visit: Payer: 59 | Admitting: Neurology

## 2021-06-16 ENCOUNTER — Other Ambulatory Visit: Payer: Self-pay | Admitting: Neurology

## 2021-06-16 ENCOUNTER — Telehealth: Payer: Self-pay | Admitting: Neurology

## 2021-06-16 ENCOUNTER — Encounter: Payer: Self-pay | Admitting: Neurology

## 2021-06-16 VITALS — BP 130/83 | HR 88 | Ht 65.0 in | Wt 182.0 lb

## 2021-06-16 DIAGNOSIS — I6330 Cerebral infarction due to thrombosis of unspecified cerebral artery: Secondary | ICD-10-CM

## 2021-06-16 DIAGNOSIS — I639 Cerebral infarction, unspecified: Secondary | ICD-10-CM

## 2021-06-16 DIAGNOSIS — G890 Central pain syndrome: Secondary | ICD-10-CM

## 2021-06-16 MED ORDER — DULOXETINE HCL 30 MG PO CPEP: ORAL_CAPSULE | ORAL | 3 refills | Status: DC

## 2021-06-16 NOTE — Progress Notes (Signed)
Reason for visit: Cerebrovascular disease  Sheila Guerrero is a 60 y.o. female  History of present illness:  Sheila Guerrero is a 60 year old right-handed white female with a history of stroke.  The patient was last seen here on 10 March 2021.  She was felt to have vegetations on the mitral and aortic valves as a source of her stroke.  Apparently the patient has been seen through cardiology since that time and it is felt that she never had vegetations.  The patient did undergo a TEE that suggested small vegetations on the aortic and mitral valves.  The patient was seen through this office on 10 March 2021 and went to the hospital with a decline in her functional level on 14 April was found to have a new right thalamic stroke.  The patient has been on aspirin and Plavix since that time.  The patient has had a residual left sided numbness and tingling with discomfort with a burning or heat sensation.  She has altered temperature sensation on the left side including the face, arm, and leg.  She has some left-sided weakness and some gait instability.  She is still getting physical and Occupational Therapy.  The patient last fell about 6 weeks ago, she has had 2 falls since last seen.  She uses a walker for ambulation.  She denies issues controlling the bowels or the bladder, she has not had any visual disturbance other than some light sensitivity.  She denies any headaches.  She comes here for an evaluation.  She is on Lyrica taking 150 mg 3 times daily for the left-sided discomfort.  The patient feels somewhat drugged on this medication, she feels cognitively slow.  Past Medical History:  Diagnosis Date   Angioedema    Anxiety    Asthma    Depression    Diabetes mellitus, type II ([place])    Dyslipidemia    Family history of adverse reaction to anesthesia    mother severely confused after general anesthesia   History of CVA (cerebrovascular accident)    HTN (hypertension) 2013   Panic attacks     Pneumonia    Septic embolism ([place]) 02/10/2021   Situational stress    Subacute endocarditis 02/10/2021    Past Surgical History:  Procedure Laterality Date   BUBBLE STUDY  02/10/2021   Procedure: BUBBLE STUDY;  Surgeon: Dixie Dials, MD;  Location: [place] [facility];  Service: Cardiovascular;;   CHOLECYSTECTOMY  2010   TEE WITHOUT CARDIOVERSION N/A 02/10/2021   Procedure: TRANSESOPHAGEAL ECHOCARDIOGRAM (TEE);  Surgeon: Dixie Dials, MD;  Location: [facility] ENDOSCOPY;  Service: Cardiovascular;  Laterality: N/A;   TEE WITHOUT CARDIOVERSION N/A 03/27/2021   Procedure: TRANSESOPHAGEAL ECHOCARDIOGRAM (TEE);  Surgeon: Dixie Dials, MD;  Location: [facility] ENDOSCOPY;  Service: Cardiovascular;  Laterality: N/A;    Family History  Problem Relation Age of Onset   Hypertension Mother    Other Mother        blood disorder   Hypertension Father    Stroke Father    Diabetes Maternal Aunt     Social history:  reports that she has never smoked. She has never used smokeless tobacco. She reports current alcohol use. She reports that she does not use drugs.  Medications:  Prior to Admission medications   Medication Sig Start Date End Date Taking? Authorizing Provider  aspirin EC 81 MG EC tablet Take 1 tablet (81 mg total) by mouth daily. Swallow whole. 02/13/21  Yes Barb Merino, MD  atorvastatin (LIPITOR) 80  MG tablet Take 1 tablet (80 mg total) by mouth daily. 03/23/21 03/23/22 Yes Donnamae Jude, MD  carvedilol (COREG) 6.25 MG tablet Take 6.25 mg by mouth 2 (two) times daily with a meal.   Yes [provider]  clopidogrel (PLAVIX) 75 MG tablet Take 1 tablet by mouth daily. 04/04/21  Yes [provider]  Cobalamin Combinations (VITAMIN B12-FOLIC ACID) 458-099 MCG TABS Take by mouth.   Yes [provider]  EPINEPHrine 0.3 mg/0.3 mL IJ SOAJ injection Inject 0.3 mg into the muscle daily as needed for anaphylaxis.   Yes [provider]  Insulin Pen Needle (PEN NEEDLES) 29G X 12MM  MISC 1 each by Does not apply route daily. 02/12/21  Yes Ghimire, Dante Gang, MD  LANTUS SOLOSTAR 100 UNIT/ML Solostar Pen Inject 30 Units into the skin at bedtime. 02/23/21  Yes [provider]  pregabalin (LYRICA) 150 MG capsule Take 150 mg by mouth 3 (three) times daily. 04/30/21  Yes [provider]  sacubitril-valsartan (ENTRESTO) 49-51 MG Take 1 tablet by mouth 2 (two) times daily. 05/19/21  Yes Martinique, Peter M, MD  traMADol (ULTRAM) 50 MG tablet Take 50 mg by mouth 2 (two) times daily as needed for moderate pain. 02/27/21  Yes [provider]      Allergies  Allergen Reactions   Sulfa Antibiotics Swelling   Dulaglutide     Other reaction(s): rash   Empagliflozin     Other reaction(s): recurrent yeast   Iodinated Diagnostic Agents     Other reaction(s): angioedema with CT dye   Latex Rash    ROS:  Out of a complete 14 system review of symptoms, the patient complains only of the following symptoms, and all other reviewed systems are negative.  Walking difficulty Left-sided numbness Weakness  Blood pressure 130/83, pulse 88, height 5\' 5"  (1.651 m), weight 182 lb (82.6 kg), last menstrual period 11/25/2011.  Physical Exam  General: The patient is alert and cooperative at the time of the examination.  Eyes: Pupils are equal, round, and reactive to light. Discs are flat bilaterally.  Neck: The neck is supple, no carotid bruits are noted.  Respiratory: The respiratory examination is clear.  Cardiovascular: The cardiovascular examination reveals a regular rate and rhythm, no obvious murmurs or rubs are noted.  Skin: Extremities are without significant edema.  Neurologic Exam  Mental status: The patient is alert and oriented x 3 at the time of the examination. The patient has apparent normal recent and remote memory, with an apparently normal attention span and concentration ability.  Cranial nerves: Facial symmetry is present. There is good sensation of  the face to pinprick and soft touch bilaterally. The strength of the facial muscles and the muscles to head turning and shoulder shrug are normal bilaterally. Speech is well enunciated, no aphasia or dysarthria is noted. Extraocular movements are full. Visual fields are full. The tongue is midline, and the patient has symmetric elevation of the soft palate. No obvious hearing deficits are noted.  Motor: The motor testing reveals 5 over 5 strength of all 4 extremities, with exception of slight weakness of the left deltoid muscle and with hip flexion on the left. Good symmetric motor tone is noted throughout.  Sensory: Sensory testing is intact to pinprick, soft touch, vibration sensation, and position sense on all 4 extremities, however the patient has altered temperature sensation of the left arm and left leg as compared to the right. No evidence of extinction is noted.  Coordination: Cerebellar  testing reveals good finger-nose-finger and heel-to-shin bilaterally.  Gait and station: Gait is wide-based, unsteady, the patient usually uses a walker for ambulation.  Tandem gait was not attempted.  Romberg is negative but is unsteady  Reflexes: Deep tendon reflexes are symmetric and normal bilaterally. Toes are downgoing bilaterally.   MRI brain/ MRA head and neck 03/13/21:  IMPRESSION: 1. Acute to early subacute right and likely late subacute left thalamic infarcts. No mass effect or acute hemorrhage. 2. Severe stenosis of the distal right MCA M1 segment. 3. Severe stenosis of the distal right V4 segment. 4. No stenosis of the carotid or vertebral arteries in the neck.  * MRI scan images were reviewed online. I agree with the written report.   TEE 03/27/21:  IMPRESSIONS     1. Left ventricular ejection fraction, by estimation, is 45 to 50%. The  left ventricle has mildly decreased function. The left ventricle  demonstrates global hypokinesis. Left ventricular diastolic function could  not  be evaluated.   2. Right ventricular systolic function is normal. The right ventricular  size is normal.   3. Left atrial size was mildly dilated. No left atrial/left atrial  appendage thrombus was detected.   4. The mitral valve is grossly normal. Mild mitral valve regurgitation.   5. Smaller than before sesile vegetation over medial leaflet.   6. Very small AV vegetation on non-coroanry cusp. The aortic valve is  tricuspid. There is mild calcification of the aortic valve. There is mild  thickening of the aortic valve. Aortic valve regurgitation is not  visualized.   7. There is mild (Grade II) atheroma plaque involving the ascending aorta  and descending aorta.     Assessment/Plan:  1.  Cerebrovascular disease, bihemispheric strokes  2.  Thalamic pain syndrome, left side  3.  Gait disorder  The patient will be converted to Plavix alone at this point.  She will be given a trial on Cymbalta for the left-sided discomfort.  She feels drugged on the current dose of the Lyrica, she gets a total of 450 mg daily.  This could potentially be worsening her balance issue as well.  If the Cymbalta is effective, we may be able to reduce the Lyrica dose.  The patient will work up to 30 mg twice daily for the Cymbalta and let me know how she is doing with this.  She will otherwise follow-up in 4 or 5 months.  In the future, she can be followed through Dr. Leonie Man.  Jill Alexanders MD 06/16/2021 8:54 AM  [facility] [address] [place], [place] [address]  Phone [phone] Fax [phone]

## 2021-06-16 NOTE — Telephone Encounter (Signed)
I would just leave off aldactone. Thanks  Alyssa Mancera Martinique MD, [facility]

## 2021-06-16 NOTE — Telephone Encounter (Signed)
I tried to call pt but the call didn't go through. Sent mychart message letting her know I switched her to see Janett Billow instead of Jinny Blossom because she was seen here with Dr. Jannifer Franklin for a stroke hospital follow-up.

## 2021-06-17 ENCOUNTER — Other Ambulatory Visit: Payer: Self-pay

## 2021-06-17 ENCOUNTER — Ambulatory Visit: Payer: 59 | Admitting: Occupational Therapy

## 2021-06-17 ENCOUNTER — Ambulatory Visit: Payer: 59

## 2021-06-17 ENCOUNTER — Encounter: Payer: Self-pay | Admitting: Occupational Therapy

## 2021-06-17 DIAGNOSIS — R41840 Attention and concentration deficit: Secondary | ICD-10-CM

## 2021-06-17 DIAGNOSIS — R278 Other lack of coordination: Secondary | ICD-10-CM

## 2021-06-17 DIAGNOSIS — R2689 Other abnormalities of gait and mobility: Secondary | ICD-10-CM

## 2021-06-17 DIAGNOSIS — M6281 Muscle weakness (generalized): Secondary | ICD-10-CM

## 2021-06-17 DIAGNOSIS — R2681 Unsteadiness on feet: Secondary | ICD-10-CM

## 2021-06-17 DIAGNOSIS — R41842 Visuospatial deficit: Secondary | ICD-10-CM

## 2021-06-17 DIAGNOSIS — R41844 Frontal lobe and executive function deficit: Secondary | ICD-10-CM

## 2021-06-17 NOTE — Therapy (Signed)
[place], [name], [address] Phone: [phone]   Fax:  [phone]  Occupational Therapy Treatment & Discharge  Patient Details  Name: Sheila Guerrero MRN: [id number] Date of Birth: 1961/09/04 Referring Provider (OT): Dr. Nigel Bridgeman   Encounter Date: 06/17/2021   OT End of Session - 06/17/21 1101     Visit Number 10    Number of Visits 13    Date for OT Re-Evaluation 06/19/21    Authorization Time Period POC written for 12 weks anticipate d/c after 8 weeks    Authorization - Visit Number 9    Authorization - Number of Visits 89    OT Start Time 1100    OT Stop Time 1130    OT Time Calculation (min) 30 min    Activity Tolerance Patient tolerated treatment well    Behavior During Therapy [facility] for tasks assessed/performed             Past Medical History:  Diagnosis Date   Angioedema    Anxiety    Asthma    Depression    Diabetes mellitus, type II ([place])    Dyslipidemia    Family history of adverse reaction to anesthesia    mother severely confused after general anesthesia   History of CVA (cerebrovascular accident)    HTN (hypertension) 2013   Panic attacks    Pneumonia    Septic embolism ([place]) 02/10/2021   Situational stress    Subacute endocarditis 02/10/2021    Past Surgical History:  Procedure Laterality Date   BUBBLE STUDY  02/10/2021   Procedure: BUBBLE STUDY;  Surgeon: Dixie Dials, MD;  Location: [facility];  Service: Cardiovascular;;   CHOLECYSTECTOMY  2010   TEE WITHOUT CARDIOVERSION N/A 02/10/2021   Procedure: TRANSESOPHAGEAL ECHOCARDIOGRAM (TEE);  Surgeon: Dixie Dials, MD;  Location: [facility] ENDOSCOPY;  Service: Cardiovascular;  Laterality: N/A;   TEE WITHOUT CARDIOVERSION N/A 03/27/2021   Procedure: TRANSESOPHAGEAL ECHOCARDIOGRAM (TEE);  Surgeon: Dixie Dials, MD;  Location: [facility] ENDOSCOPY;  Service: Cardiovascular;  Laterality: N/A;    There were no vitals filed  for this visit.   Subjective Assessment - 06/17/21 1100     Subjective  "doing alright" Pt reports nothing new and reports consistent pain on left side.    Pertinent History 60 year old female with past medical history of angioedema, hypertension, hyperlipidemia, type 2 diabetes, anxiety, depression, type 2 diabetes, CVA in 2017, new CVA March 02/07/21    Currently in Pain? Yes    Pain Score 5     Pain Location --   whole L side   Pain Orientation Left    Pain Descriptors / Indicators Constant    Pain Type Neuropathic pain    Pain Onset More than a month ago    Pain Frequency Constant    Aggravating Factors  pressure    Pain Relieving Factors moving              OCCUPATIONAL THERAPY DISCHARGE SUMMARY  Visits from Start of Care: 10  Current functional level related to goals / functional outcomes: Pt made incredible gains with grip strength and coordination with LUE. Pt increased with overall independence with ADLs and IADLs and is back to completing cooking and home management independently.    Remaining deficits: Sensory deficits and pain on L hemi    Education / Equipment: Theraputty, coordination HEP, proximal strength HEP   Patient agrees to discharge. Patient goals were partially met. Patient is being discharged due  to being pleased with the current functional level..                         OT Short Term Goals - 05/13/21 1200       OT SHORT TERM GOAL #1   Title I with inital HEP (including coordination)    Time 6    Period Weeks    Status Achieved   pt reports not doing anything at home right now 04/17/21   Target Date 05/01/21   updated at renewal 04/17/21     OT [place] #2   Title Pt will verbalize understanding of compensatory strategies for visual and cognitive deficits.    Time 4    Period Weeks    Status Achieved      OT SHORT TERM GOAL #3   Title Pt will perform basic cooking and home management modified independently  demonstrating good safety awareness.    Time 4    Period Weeks    Status Achieved   scrambled eggs in clinic with mod I     OT SHORT TERM GOAL #4   Title I with sensory precautions for LUE and will demonstrate understanding of compensations during functional activity.    Time 4    Period Weeks    Status Achieved      OT SHORT TERM GOAL #5   Title Pt will locate items in a mod distracting environment with 90% or better accuracy in prep for driving/ grocery shopping.    Time 4    Period Weeks    Status Achieved   94% with environmental scanning in therapy gym.     OT SHORT TERM GOAL #6   Title Pt will demonstrate improved fine motor coordination as evidenced by decreasing 9 hole peg test score to 35 secs or less with LUE.    Baseline RUE 24.53, LUE 39.22    Time 4    Period Weeks    Status Achieved   L 32.12s 05/13/21     OT SHORT TERM GOAL #7   Title I with bed positioning to minimize LUE pain at night time.    Time 4    Period Weeks    Status Achieved   handout issued              OT Long Term Goals - 06/17/21 1105       OT LONG TERM GOAL #1   Title I with updated HEP for LUE strength.    Time 12    Period Weeks    Status Achieved   yellow theraband - reports going well     OT LONG TERM GOAL #2   Title Pt will demonstrate ability to carry a cup of water in 1 hand and a plate in the other without drops or spills.    Time 12    Period Weeks    Status Achieved   Pt walked approx 50 ft while carrying without drops.     OT LONG TERM GOAL #3   Title Pt will increase LUE grip strength to 36 lbs or greater for increased functional use.    Time 12    Period Weeks    Status Not Met   34.6 lbs with LUE 06/17/21     OT LONG TERM GOAL #4   Title Pt will perform a physical and cogntive task simultaneously with no LOB and 90% or better accuracy.    Time  12    Period Weeks    Status Achieved   with subtraction 100% accuracy - 06/17/21     OT LONG TERM GOAL #5   Title Pt  will complete FOTO survey at d/c.    Time 12    Period Weeks    Status Deferred   pt only did lower extremity at evaluation. defer to PT.     OT LONG TERM GOAL #6   Title Pt will menu plan, shop(online or in store) and cook a mod complex meal from start to finish modified independently.    Time 12    Period Weeks    Status Achieved   pt reports doing this consistently 05/13/21     OT LONG TERM GOAL #7   Title Pt will verbalize recommendations regarding return to driving including driving eval prn.    Time 12    Period Weeks    Status Deferred   until taking less medication patient is not planning to drive anytime soon.                  Plan - 06/17/21 1147     Clinical Impression Statement Pt has met all STGs and has met 4/7 LTGs. 2 goals were deferred d/t appropriateness and 1 was not met. Pt is ready for discharge at this time from occupational therapy.    OT Occupational Profile and History Detailed Assessment- Review of Records and additional review of physical, cognitive, psychosocial history related to current functional performance    Occupational performance deficits (Please refer to evaluation for details): ADL's;IADL's;Leisure;Social Participation    Body Structure / Function / Physical Skills ADL;Endurance;UE functional use;Balance;Vision;Pain;Flexibility;FMC;ROM;Gait;GMC;Sensation;Decreased knowledge of precautions;Decreased knowledge of use of DME;IADL;Strength;Dexterity    Cognitive Skills Attention;Memory;Problem Solve;Safety Awareness;Sequencing;Temperament/Personality;Thought;Understand    Rehab Potential Good    Clinical Decision Making Limited treatment options, no task modification necessary    Comorbidities Affecting Occupational Performance: May have comorbidities impacting occupational performance    Modification or Assistance to Complete Evaluation  No modification of tasks or assist necessary to complete eval    OT Frequency 1x / week   plus eval   OT  Duration Other (comment)   9 weeks since renewal on 5/20   OT Treatment/Interventions Self-care/ADL training;Energy conservation;Visual/perceptual remediation/compensation;Patient/family education;DME and/or AE instruction;Paraffin;Passive range of motion;Balance training;Fluidtherapy;Cryotherapy;Therapist, nutritional;Therapeutic activities;Manual Therapy;Therapeutic exercise;Moist Heat;Neuromuscular education;Cognitive remediation/compensation    Plan d/c occupational therapy    Consulted and Agree with Plan of Care Patient             Patient will benefit from skilled therapeutic intervention in order to improve the following deficits and impairments:   Body Structure / Function / Physical Skills: ADL, Endurance, UE functional use, Balance, Vision, Pain, Flexibility, FMC, ROM, Gait, GMC, Sensation, Decreased knowledge of precautions, Decreased knowledge of use of DME, IADL, Strength, Dexterity Cognitive Skills: Attention, Memory, Problem Solve, Safety Awareness, Sequencing, Temperament/Personality, Thought, Understand     Visit Diagnosis: Muscle weakness (generalized)  Frontal lobe and executive function deficit  Other lack of coordination  Attention and concentration deficit  Unsteadiness on feet  Other abnormalities of gait and mobility  Visuospatial deficit    Problem List Patient Active Problem List   Diagnosis Date Noted   Thalamic pain syndrome (hyperesthetic) 04/16/2021   Cerebral thrombosis with cerebral infarction 03/13/2021   Acute encephalopathy 03/12/2021   Bacteremia    Malnutrition of moderate degree 02/09/2021   Acute ischemic stroke ([place]) 02/08/2021   Infarction of right thalamus ([place]) 02/07/2021  Essential hypertension 02/07/2021   Paresthesia 02/02/2021   Gait abnormality 02/02/2021   Confusion 05/08/2018   Stroke ([place]) 03/17/2017   Type 2 diabetes mellitus with hyperlipidemia ([place]) 03/17/2017   Confusion state 02/16/2017   Angioedema  02/16/2017   Panic 02/04/2017   Adjustment disorder with anxious mood 02/04/2017   Sinusitis, chronic 12/28/2011   Rhinitis, allergic 12/28/2011   Extrinsic asthma 12/07/2011    Zachery Conch MOT, OTR/L  06/17/2021, 11:56 AM  [place] [address] [place], [name], [address] Phone: [phone]   Fax:  [phone]  Name: SHENEQUA HOWSE MRN: [id number] Date of Birth: 1961/04/05

## 2021-06-19 ENCOUNTER — Ambulatory Visit: Payer: 59

## 2021-06-19 DIAGNOSIS — D259 Leiomyoma of uterus, unspecified: Secondary | ICD-10-CM | POA: Insufficient documentation

## 2021-06-19 NOTE — Progress Notes (Deleted)
Patient ID: Sheila Guerrero                 DOB: 16-Apr-1961                      MRN: [id number]     HPI: Sheila Guerrero is a 60 y.o. female referred by Dr. Dr [place] to pharmacy clinic for HF medication management. PMH is significant for multiple CVAs, uncontrolled DM, HTN, and HLD.  A1c 12.1% on 02/09/21.  Most recent LVEF 45-50% on 03/27/21.  Today she returns to pharmacy clinic for further medication titration. At last visit with MD ***. Symptomatically, she is feeling ***, *** dizziness, lightheadedness, and fatigue. *** chest pain or palpitations. Feels SOB when ***. Able to complete all ADLs. Activity level ***. She *** checks her weight at home (normal range *** - *** lbs). *** LEE, PND, or orthopnea. Appetite has been ***. She *** adheres to a low-salt diet.  Current CHF meds:  Previously tried:  BP goal:   Family History:   Social History:   Diet:   Exercise:   Home BP readings:   Wt Readings from Last 3 Encounters:  06/16/21 182 lb (82.6 kg)  05/19/21 181 lb (82.1 kg)  04/16/21 179 lb 12.8 oz (81.6 kg)   BP Readings from Last 3 Encounters:  06/16/21 130/83  06/03/21 119/76  05/19/21 138/82   Pulse Readings from Last 3 Encounters:  06/16/21 88  06/03/21 100  05/19/21 86    Renal function: CrCl cannot be calculated (Patient's most recent lab result is older than the maximum 21 days allowed.).  Past Medical History:  Diagnosis Date   Angioedema    Anxiety    Asthma    Depression    Diabetes mellitus, type II ([place])    Dyslipidemia    Family history of adverse reaction to anesthesia    mother severely confused after general anesthesia   History of CVA (cerebrovascular accident)    HTN (hypertension) 2013   Panic attacks    Pneumonia    Septic embolism ([place]) 02/10/2021   Situational stress    Subacute endocarditis 02/10/2021    Current Outpatient Medications on File Prior to Visit  Medication Sig Dispense Refill   aspirin EC 81 MG EC tablet  Take 1 tablet (81 mg total) by mouth daily. Swallow whole. 30 tablet 11   atorvastatin (LIPITOR) 80 MG tablet Take 1 tablet (80 mg total) by mouth daily. 30 tablet 11   carvedilol (COREG) 6.25 MG tablet Take 6.25 mg by mouth 2 (two) times daily with a meal.     clopidogrel (PLAVIX) 75 MG tablet Take 1 tablet by mouth daily.     Cobalamin Combinations (VITAMIN B12-FOLIC ACID) XX123456 MCG TABS Take by mouth.     DULoxetine (CYMBALTA) 30 MG capsule One tablet daily for 2 weeks, then take one tablet twice a day 60 capsule 3   EPINEPHrine 0.3 mg/0.3 mL IJ SOAJ injection Inject 0.3 mg into the muscle daily as needed for anaphylaxis.     Insulin Pen Needle (PEN NEEDLES) 29G X 12MM MISC 1 each by Does not apply route daily. 30 each 2   LANTUS SOLOSTAR 100 UNIT/ML Solostar Pen Inject 30 Units into the skin at bedtime.     pregabalin (LYRICA) 150 MG capsule Take 150 mg by mouth 3 (three) times daily.     sacubitril-valsartan (ENTRESTO) 49-51 MG Take 1 tablet by mouth 2 (two) times daily. 180 tablet  3   traMADol (ULTRAM) 50 MG tablet Take 50 mg by mouth 2 (two) times daily as needed for moderate pain.     No current facility-administered medications on file prior to visit.    Allergies  Allergen Reactions   Sulfa Antibiotics Swelling   Dulaglutide     Other reaction(s): rash   Empagliflozin     Other reaction(s): recurrent yeast   Iodinated Diagnostic Agents     Other reaction(s): angioedema with CT dye   Latex Rash     Assessment/Plan:  1. CHF -

## 2021-06-25 NOTE — Telephone Encounter (Signed)
Called patient left message on personal voice mail Dr.Jordan advised to stay off Aldactone.

## 2021-06-29 ENCOUNTER — Ambulatory Visit: Payer: 59 | Attending: Internal Medicine | Admitting: Physical Therapy

## 2021-06-29 ENCOUNTER — Other Ambulatory Visit: Payer: Self-pay

## 2021-06-29 DIAGNOSIS — M6281 Muscle weakness (generalized): Secondary | ICD-10-CM

## 2021-06-29 DIAGNOSIS — R278 Other lack of coordination: Secondary | ICD-10-CM | POA: Diagnosis present

## 2021-06-29 DIAGNOSIS — R2681 Unsteadiness on feet: Secondary | ICD-10-CM | POA: Diagnosis present

## 2021-06-29 DIAGNOSIS — R2689 Other abnormalities of gait and mobility: Secondary | ICD-10-CM

## 2021-06-29 DIAGNOSIS — R41844 Frontal lobe and executive function deficit: Secondary | ICD-10-CM | POA: Diagnosis not present

## 2021-06-29 NOTE — Therapy (Signed)
[facility] [address] [place], [name], [address] Phone: [phone]   Fax:  [phone]  Physical Therapy Treatment and Re-Cert  Patient Details  Name: Sheila Guerrero MRN: [id number] Date of Birth: 05/18/1961 Referring Provider (PT): Barb Merino (not reffered by, but previous neurologist has been Dr. Krista Blue)   Encounter Date: 06/29/2021   PT End of Session - 06/29/21 1058     Visit Number 9    Number of Visits 15    Date for PT Re-Evaluation 08/10/21    Authorization Type UHC - $40 co pay per discipline; Recert from 06/29/09 to 02/10/93    PT Start Time 1017    PT Stop Time 1059    PT Time Calculation (min) 42 min    Equipment Utilized During Treatment Gait belt    Activity Tolerance Patient tolerated treatment well    Behavior During Therapy WFL for tasks assessed/performed             Past Medical History:  Diagnosis Date   Angioedema    Anxiety    Asthma    Depression    Diabetes mellitus, type II ([place])    Dyslipidemia    Family history of adverse reaction to anesthesia    mother severely confused after general anesthesia   History of CVA (cerebrovascular accident)    HTN (hypertension) 2013   Panic attacks    Pneumonia    Septic embolism ([place]) 02/10/2021   Situational stress    Subacute endocarditis 02/10/2021    Past Surgical History:  Procedure Laterality Date   BUBBLE STUDY  02/10/2021   Procedure: BUBBLE STUDY;  Surgeon: Dixie Dials, MD;  Location: [facility];  Service: Cardiovascular;;   CHOLECYSTECTOMY  2010   TEE WITHOUT CARDIOVERSION N/A 02/10/2021   Procedure: TRANSESOPHAGEAL ECHOCARDIOGRAM (TEE);  Surgeon: Dixie Dials, MD;  Location: [facility] ENDOSCOPY;  Service: Cardiovascular;  Laterality: N/A;   TEE WITHOUT CARDIOVERSION N/A 03/27/2021   Procedure: TRANSESOPHAGEAL ECHOCARDIOGRAM (TEE);  Surgeon: Dixie Dials, MD;  Location: [facility] ENDOSCOPY;  Service: Cardiovascular;  Laterality:  N/A;    There were no vitals filed for this visit.   Subjective Assessment - 06/29/21 1019     Subjective Pt reports now using nordic walking poles. Pt reports being placed on Cymbalta which treats nerve pain. Pt is sleeping well. Pt states she is working on her exercises some. Pt reports walking on grass fine with the poles.    Pertinent History history of hypertension, hyperlipidemia, type 2 diabetes on Metformin at home, anxiety and depression, history of a stroke in 2017, Angioedema    Limitations Walking    Diagnostic tests MRI showed small acute to early subacute infarct in the right  thalamus/posterior limb of internal capsule and small subacute left occipital lobe infarct.    Patient Stated Goals wants to know what she can do and shouldn't do    Currently in Pain? Yes    Pain Score 4     Pain Orientation Left    Pain Type Neuropathic pain    Pain Onset More than a month ago                [facility] PT Assessment - 06/29/21 0001       Observation/Other Assessments   Focus on Therapeutic Outcomes (FOTO)  55      Transfers   Five time sit to stand comments  10 sec no UE support      Ambulation/Gait   Ambulation/Gait Assistance  5: Supervision    Ambulation Distance (Feet) 330 Feet    Assistive device --   single walking pole   Gait Pattern Step-through pattern;Decreased step length - left;Decreased step length - right;Abducted - left    Ambulation Surface Level;Indoor    Gait velocity 2.85 ft/sec      Functional Gait  Assessment   Gait assessed  Yes    Gait Level Surface Walks 20 ft in less than 7 sec but greater than 5.5 sec, uses assistive device, slower speed, mild gait deviations, or deviates 6-10 in outside of the 12 in walkway width.    Change in Gait Speed Able to smoothly change walking speed without loss of balance or gait deviation. Deviate no more than 6 in outside of the 12 in walkway width.    Gait with Horizontal Head Turns Performs head turns smoothly with  no change in gait. Deviates no more than 6 in outside 12 in walkway width    Gait with Vertical Head Turns Performs head turns with no change in gait. Deviates no more than 6 in outside 12 in walkway width.    Gait and Pivot Turn Pivot turns safely within 3 sec and stops quickly with no loss of balance.    Step Over Obstacle Is able to step over one shoe box (4.5 in total height) without changing gait speed. No evidence of imbalance.    Gait with Narrow Base of Support Ambulates 4-7 steps.    Gait with Eyes Closed Walks 20 ft, slow speed, abnormal gait pattern, evidence for imbalance, deviates 10-15 in outside 12 in walkway width. Requires more than 9 sec to ambulate 20 ft.    Ambulating Backwards Walks 20 ft, uses assistive device, slower speed, mild gait deviations, deviates 6-10 in outside 12 in walkway width.    Steps Alternating feet, must use rail.    Total Score 22    FGA comment: <19= high fall risk                                Balance Exercises - 06/29/21 0001       Balance Exercises: Standing   Standing Eyes Closed Foam/compliant surface;Wide (BOA);30 secs   x2 sets each horizontal and vertical head turns   Tandem Gait Forward;Intermittent upper extremity support;4 reps   3 sec holds in tandem stance   Partial Tandem Stance Eyes open;2 reps;30 secs;Limitations    Partial Tandem Stance Limitations on blue air ex in corner, performed with BLE, during 2nd set performed x3 reps B alternating UE raises    Retro Gait Head turns;2 reps   x50' down hallway; initially with no head turns next to counter but pt not challenged enough                PT Short Term Goals - 05/13/21 1113       PT SHORT TERM GOAL #1   Title Pt will improve FGA score to at least a 20/30 in order to demo decr fall risk. ALL STGS DUE 05/15/21    Baseline 05/13/21: 17/30, improved from 13/30 just not to goal    Time --    Period --    Status Partially Met    Target Date 05/15/21       PT SHORT TERM GOAL #2   Title Pt will improve gait speed with no AD to at least 2.9 ft/sec in order to demo improved  community mobility.    Baseline 05/13/21: 2.68 ft/sec with cane, improved from 1.18 ft/sec just not to goal    Time --    Period --    Status Partially Met    Target Date 05/15/21      PT SHORT TERM GOAL #3   Title Pt will improve mCTSIB condition 4 to at least 15 seconds in order to demo improved vestibular input for balance.    Baseline 05/13/21: met in session today    Time --    Period --    Status Achieved      PT SHORT TERM GOAL #4   Title Pt will demo compliance with walking program to improve walking endurance    Baseline 05/13/21: has a walking program, up to 8 minutes a day    Status Achieved    Target Date 05/15/21      PT SHORT TERM GOAL #5   Title Pt will demo at least 150 feet more in 6 minutes with a cane to improve walking endurance    Baseline 05/13/21: 961 feet (increased from 500 feet at last assessment)    Time --    Status Achieved    Target Date --               PT Long Term Goals - 06/29/21 1059       PT LONG TERM GOAL #1   Title Pt will be independent with final HEP in order to build upon functional gains made in therapy. ALL LTGS DUE 08/10/21    Baseline Reports inconsistency 06/29/21    Time 8    Period Weeks    Status On-going      PT LONG TERM GOAL #2   Title Pt will improve FGA score to at least a 23/30 in order to demo decr fall risk.    Baseline 17/30; 22/30 on 06/29/21    Time 8    Period Weeks    Status On-going    Target Date 08/10/21      PT LONG TERM GOAL #3   Title Pt will improve gait speed with no AD to at least 3.2 ft/sec in order to demo improved community mobility.    Baseline 2.66 ft/sec with no AD; 2.85 ft/sec with cane    Time 8    Period Weeks    Status Revised    Target Date 08/10/21      PT LONG TERM GOAL #4   Title Pt will improve FOTO score to at least a 65 in order to demo improved functional  outcomes.    Baseline 53; 55 on 06/29/21    Time 6    Period Weeks    Status Revised    Target Date 08/10/21      PT LONG TERM GOAL #5   Title Pt will demo at least 750 feet with st. cane in 6 minutes to improve community ambulation    Baseline 560fet st. cane (04/17/21)    Time 6    Period Weeks    Status On-going    Target Date 08/10/21                   Plan - 06/29/21 1102     Clinical Impression Statement Re-cert provided for pt. Pt has only partially met her LTGs. Pt with improved FGA score and no longer categorizes as a high fall risk; however, did not score 23/30 per her LTG. Progressed/updated her HEP. Pt continues to  demo L LE weakness and decreased balance especially with eyes closed or on compliant surfaces. Pt now walking with nordic walking poles -- please assess use outdoor and indoors next visit.    Personal Factors and Comorbidities Comorbidity 3+;Past/Current Experience;Time since onset of injury/illness/exacerbation   pt with $40 co pay per discipline   Comorbidities history of hypertension, hyperlipidemia, type 2 diabetes, anxiety and depression, history of a stroke in 2017, Angioedema    Examination-Activity Limitations Stairs;Transfers;Locomotion Level    Examination-Participation Restrictions Community Activity;Driving    Stability/Clinical Decision Making Evolving/Moderate complexity    Rehab Potential Good    PT Frequency 1x / week    PT Duration 6 weeks    PT Treatment/Interventions ADLs/Self Care Home Management;Gait training;Stair training;Functional mobility training;Therapeutic activities;Therapeutic exercise;Neuromuscular re-education;Balance training;DME Instruction;Patient/family education;Vestibular;Visual/perceptual remediation/compensation;Moist Heat;Aquatic Therapy;Orthotic Fit/Training;Manual techniques;Passive range of motion;Energy conservation;Joint Manipulations;Spinal Manipulations    PT Next Visit Plan Please trial her walking with  nordic walking poles. Work on [employer] activities, dynamic gait with no AD, balance on compliant surfaces with vision removed and head motions.    PT Home Exercise Plan [id number]; pt to walk ~1 min on grass on her daily walks with neighbor    Consulted and Agree with Plan of Care Patient             Patient will benefit from skilled therapeutic intervention in order to improve the following deficits and impairments:  Abnormal gait, Decreased balance, Difficulty walking, Dizziness, Decreased strength, Impaired sensation, Postural dysfunction, Pain  Visit Diagnosis: Muscle weakness (generalized)  Other lack of coordination  Unsteadiness on feet  Other abnormalities of gait and mobility     Problem List Patient Active Problem List   Diagnosis Date Noted   Uterine leiomyoma 06/19/2021   Thalamic pain syndrome (hyperesthetic) 04/16/2021   Cerebral thrombosis with cerebral infarction 03/13/2021   Acute encephalopathy 03/12/2021   Bacteremia    Malnutrition of moderate degree 02/09/2021   Acute ischemic stroke ([place]) 02/08/2021   Infarction of right thalamus ([place]) 02/07/2021   Essential hypertension 02/07/2021   Paresthesia 02/02/2021   Gait abnormality 02/02/2021   Pain in joint of left shoulder 10/10/2019   Pain in joint of right shoulder 07/13/2018   Confusion 05/08/2018   Stroke ([place]) 03/17/2017   Type 2 diabetes mellitus with hyperlipidemia ([place]) 03/17/2017   Confusion state 02/16/2017   Hereditary angioedema ([place]) 02/16/2017   Panic 02/04/2017   Adjustment disorder with anxious mood 02/04/2017   Sinusitis, chronic 12/28/2011   Rhinitis, allergic 12/28/2011   Extrinsic asthma 12/07/2011    Jaysion Ramseyer April Ma L Ajai Harville PT, DPT 06/29/2021, 1:02 PM  [facility] [address] [place], [name], [address] Phone: [phone]   Fax:  [phone]  Name: Sheila Guerrero MRN: [id number] Date of Birth:  09-10-1961

## 2021-07-06 ENCOUNTER — Ambulatory Visit: Payer: 59 | Admitting: Physical Therapy

## 2021-07-06 ENCOUNTER — Other Ambulatory Visit: Payer: Self-pay

## 2021-07-06 DIAGNOSIS — M6281 Muscle weakness (generalized): Secondary | ICD-10-CM | POA: Diagnosis not present

## 2021-07-06 DIAGNOSIS — R2689 Other abnormalities of gait and mobility: Secondary | ICD-10-CM

## 2021-07-06 DIAGNOSIS — R278 Other lack of coordination: Secondary | ICD-10-CM

## 2021-07-06 DIAGNOSIS — R2681 Unsteadiness on feet: Secondary | ICD-10-CM

## 2021-07-06 NOTE — Therapy (Signed)
[facility] [address] [place], [name], [address] Phone: [phone]   Fax:  [phone]  Physical Therapy Treatment  Patient Details  Name: Sheila Guerrero MRN: [id number] Date of Birth: [birth year] Referring Provider (PT): Barb Merino (not reffered by, but previous neurologist has been Dr. Krista Blue)   Encounter Date: 07/06/2021   PT End of Session - 07/06/21 1208     Visit Number 10    Number of Visits 15    Date for PT Re-Evaluation 08/10/21    Authorization Type [employer] - $40 co pay per discipline; Recert from 01/30/18 to 3/79/02    PT Start Time 1150    PT Stop Time 1230    PT Time Calculation (min) 40 min    Equipment Utilized During Treatment Gait belt    Activity Tolerance Patient tolerated treatment well    Behavior During Therapy WFL for tasks assessed/performed             Past Medical History:  Diagnosis Date   Angioedema    Anxiety    Asthma    Depression    Diabetes mellitus, type II ([place])    Dyslipidemia    Family history of adverse reaction to anesthesia    mother severely confused after general anesthesia   History of CVA (cerebrovascular accident)    HTN (hypertension) 2013   Panic attacks    Pneumonia    Septic embolism ([place]) 02/10/2021   Situational stress    Subacute endocarditis 02/10/2021    Past Surgical History:  Procedure Laterality Date   BUBBLE STUDY  02/10/2021   Procedure: BUBBLE STUDY;  Surgeon: Dixie Dials, MD;  Location: [facility];  Service: Cardiovascular;;   CHOLECYSTECTOMY  2010   TEE WITHOUT CARDIOVERSION N/A 02/10/2021   Procedure: TRANSESOPHAGEAL ECHOCARDIOGRAM (TEE);  Surgeon: Dixie Dials, MD;  Location: [facility] ENDOSCOPY;  Service: Cardiovascular;  Laterality: N/A;   TEE WITHOUT CARDIOVERSION N/A 03/27/2021   Procedure: TRANSESOPHAGEAL ECHOCARDIOGRAM (TEE);  Surgeon: Dixie Dials, MD;  Location: [facility] ENDOSCOPY;  Service: Cardiovascular;  Laterality: N/A;     There were no vitals filed for this visit.   Subjective Assessment - 07/06/21 1154     Subjective Pt states she feels her stamina has been improving. Pt notes continued difficulties with her HEP.    Pertinent History history of hypertension, hyperlipidemia, type 2 diabetes on Metformin at home, anxiety and depression, history of a stroke in 2017, Angioedema    Limitations Walking    Diagnostic tests MRI showed small acute to early subacute infarct in the right  thalamus/posterior limb of internal capsule and small subacute left occipital lobe infarct.    Patient Stated Goals wants to know what she can do and shouldn't do    Pain Onset More than a month ago                               [facility] Adult PT Treatment/Exercise - 07/06/21 0001       Exercises   Exercises Knee/Hip      Ankle Exercises: Aerobic   Other Aerobic Sci fit L5 x 5 min @ 70 steps/min UEs & LEs                 Balance Exercises - 07/06/21 0001       Balance Exercises: Standing   Standing Eyes Closed Foam/compliant surface;Wide (BOA);30 secs;Narrow base of support (BOS)    Standing Eyes  Closed Limitations wide base of support EC 2x10 head nods & then head turns    SLS with Vectors Foam/compliant surface;Intermittent upper extremity assist;Limitations;Upper extremity assist 1    SLS with Vectors Limitations on airex    Tandem Gait Forward;Intermittent upper extremity support;4 reps    Other Standing Exercises Forward amb with eyes closed 6x50'                 PT Guerrero Term Goals - 05/13/21 1113       PT Guerrero TERM GOAL #1   Title Pt will improve FGA score to at least a 20/30 in order to demo decr fall risk. ALL STGS DUE 05/15/21    Baseline 05/13/21: 17/30, improved from 13/30 just not to goal    Time --    Period --    Status Partially Met    Target Date 05/15/21      PT Guerrero TERM GOAL #2   Title Pt will improve gait speed with no AD to at least 2.9 ft/sec in order  to demo improved community mobility.    Baseline 05/13/21: 2.68 ft/sec with cane, improved from 1.18 ft/sec just not to goal    Time --    Period --    Status Partially Met    Target Date 05/15/21      PT Guerrero TERM GOAL #3   Title Pt will improve mCTSIB condition 4 to at least 15 seconds in order to demo improved vestibular input for balance.    Baseline 05/13/21: met in session today    Time --    Period --    Status Achieved      PT Guerrero TERM GOAL #4   Title Pt will demo compliance with walking program to improve walking endurance    Baseline 05/13/21: has a walking program, up to 8 minutes a day    Status Achieved    Target Date 05/15/21      PT Guerrero TERM GOAL #5   Title Pt will demo at least 150 feet more in 6 minutes with a cane to improve walking endurance    Baseline 05/13/21: 961 feet (increased from 500 feet at last assessment)    Time --    Status Achieved    Target Date --               PT Long Term Goals - 06/29/21 1059       PT LONG TERM GOAL #1   Title Pt will be independent with final HEP in order to build upon functional gains made in therapy. ALL LTGS DUE 08/10/21    Baseline Reports inconsistency 06/29/21    Time 8    Period Weeks    Status On-going      PT LONG TERM GOAL #2   Title Pt will improve FGA score to at least a 23/30 in order to demo decr fall risk.    Baseline 17/30; 22/30 on 06/29/21    Time 8    Period Weeks    Status On-going    Target Date 08/10/21      PT LONG TERM GOAL #3   Title Pt will improve gait speed with no AD to at least 3.2 ft/sec in order to demo improved community mobility.    Baseline 2.66 ft/sec with no AD; 2.85 ft/sec with cane    Time 8    Period Weeks    Status Revised    Target Date 08/10/21  PT LONG TERM GOAL #4   Title Pt will improve FOTO score to at least a 65 in order to demo improved functional outcomes.    Baseline 53; 55 on 06/29/21    Time 6    Period Weeks    Status Revised    Target Date  08/10/21      PT LONG TERM GOAL #5   Title Pt will demo at least 750 feet with st. cane in 6 minutes to improve community ambulation    Baseline 563fet st[address] cane (04/17/21)    Time 6    Period Weeks    Status On-going    Target Date 08/10/21                   Plan - 07/06/21 1250     Clinical Impression Statement Updated pt's HEP. Provided new strengthening exercises for her L LE. Continued to work on improving pt's proprioception and balance with eyes closed. Utilizes nordic walking poles safely on R.    Personal Factors and Comorbidities Comorbidity 3+;Past/Current Experience;Time since onset of injury/illness/exacerbation    Comorbidities history of hypertension, hyperlipidemia, type 2 diabetes, anxiety and depression, history of a stroke in 2017, Angioedema    Examination-Activity Limitations Stairs;Transfers;Locomotion Level    Examination-Participation Restrictions Community Activity;Driving    Rehab Potential Good    PT Frequency 1x / week    PT Duration 6 weeks    PT Treatment/Interventions ADLs/Self Care Home Management;Gait training;Stair training;Functional mobility training;Therapeutic activities;Therapeutic exercise;Neuromuscular re-education;Balance training;DME Instruction;Patient/family education;Vestibular;Visual/perceptual remediation/compensation;Moist Heat;Aquatic Therapy;Orthotic Fit/Training;Manual techniques;Passive range of motion;Energy conservation;Joint Manipulations;Spinal Manipulations    PT Next Visit Plan Trial outdoors with her walking poles. Work on [employer]ivities, dynamic gait with no AD, balance on compliant surfaces with vision removed.    PT Home Exercise Plan [id number]    Consulted and Agree with Plan of Care Patient             Patient will benefit from skilled therapeutic intervention in order to improve the following deficits and impairments:  Abnormal gait, Decreased balance, Difficulty walking, Dizziness, Decreased strength, Impaired  sensation, Postural dysfunction, Pain  Visit Diagnosis: Muscle weakness (generalized)  Other lack of coordination  Unsteadiness on feet  Other abnormalities of gait and mobility     Problem List Patient Active Problem List   Diagnosis Date Noted   Uterine leiomyoma 06/19/2021   Thalamic pain syndrome (hyperesthetic) 04/16/2021   Cerebral thrombosis with cerebral infarction 03/13/2021   Acute encephalopathy 03/12/2021   Bacteremia    Malnutrition of moderate degree 02/09/2021   Acute ischemic stroke ([place] 02/08/2021   Infarction of right thalamus ([place] 02/07/2021   Essential hypertension 02/07/2021   Paresthesia 02/02/2021   Gait abnormality 02/02/2021   Pain in joint of left shoulder 10/10/2019   Pain in joint of right shoulder 07/13/2018   Confusion 05/08/2018   [place] ([place] 03/17/2017   Type 2 diabetes mellitus with hyperlipidemia ([place] 03/17/2017   Confusion state 02/16/2017   Hereditary angioedema ([place] 02/16/2017   Panic 02/04/2017   Adjustment disorder with anxious mood 02/04/2017   Sinusitis, chronic 12/28/2011   Rhinitis, allergic 12/28/2011   Extrinsic asthma 12/07/2011    Sheila Guerrero April Ma L Colden Samaras PT, DPT 07/06/2021, 12:52 PM  [facility]melleG[place] [address]Phone: [phone]  Fax:  [phone] Name: Sheila BELIZAIREMRN: [id number] of Birth: 7April 13, 1962

## 2021-07-13 ENCOUNTER — Ambulatory Visit: Payer: 59 | Admitting: Physical Therapy

## 2021-07-13 ENCOUNTER — Other Ambulatory Visit: Payer: Self-pay

## 2021-07-13 ENCOUNTER — Encounter: Payer: Self-pay | Admitting: Physical Therapy

## 2021-07-13 DIAGNOSIS — R2681 Unsteadiness on feet: Secondary | ICD-10-CM

## 2021-07-13 DIAGNOSIS — M6281 Muscle weakness (generalized): Secondary | ICD-10-CM | POA: Diagnosis not present

## 2021-07-13 DIAGNOSIS — R2689 Other abnormalities of gait and mobility: Secondary | ICD-10-CM

## 2021-07-13 NOTE — Therapy (Signed)
[facility] [address] [place], [name], [address] Phone: [phone]   Fax:  [phone]  Physical Therapy Treatment  Patient Details  Name: Sheila Guerrero MRN: [id number] Date of Birth: 06/08/1961 Referring Provider (PT): Barb Merino (not reffered by, but previous neurologist has been Dr. Krista Blue)   Encounter Date: 07/13/2021   PT End of Session - 07/13/21 1312     Visit Number 11    Number of Visits 15    Date for PT Re-Evaluation 08/10/21    Authorization Type [employer] - $40 co pay per discipline; Recert from 12/31/00 to 5/42/70    PT Start Time 1232    PT Stop Time 6237    PT Time Calculation (min) 40 min    Equipment Utilized During Treatment Gait belt    Activity Tolerance Patient tolerated treatment well    Behavior During Therapy WFL for tasks assessed/performed             Past Medical History:  Diagnosis Date   Angioedema    Anxiety    Asthma    Depression    Diabetes mellitus, type II ([place])    Dyslipidemia    Family history of adverse reaction to anesthesia    mother severely confused after general anesthesia   History of CVA (cerebrovascular accident)    HTN (hypertension) 2013   Panic attacks    Pneumonia    Septic embolism ([place]) 02/10/2021   Situational stress    Subacute endocarditis 02/10/2021    Past Surgical History:  Procedure Laterality Date   BUBBLE STUDY  02/10/2021   Procedure: BUBBLE STUDY;  Surgeon: Dixie Dials, MD;  Location: [facility];  Service: Cardiovascular;;   CHOLECYSTECTOMY  2010   TEE WITHOUT CARDIOVERSION N/A 02/10/2021   Procedure: TRANSESOPHAGEAL ECHOCARDIOGRAM (TEE);  Surgeon: Dixie Dials, MD;  Location: [facility] ENDOSCOPY;  Service: Cardiovascular;  Laterality: N/A;   TEE WITHOUT CARDIOVERSION N/A 03/27/2021   Procedure: TRANSESOPHAGEAL ECHOCARDIOGRAM (TEE);  Surgeon: Dixie Dials, MD;  Location: [facility] ENDOSCOPY;  Service: Cardiovascular;  Laterality: N/A;     There were no vitals filed for this visit.   Subjective Assessment - 07/13/21 1233     Subjective Doing well. Reports balance is much better and feels better doing things about in the community.    Pertinent History history of hypertension, hyperlipidemia, type 2 diabetes on Metformin at home, anxiety and depression, history of a stroke in 2017, Angioedema    Limitations Walking    Diagnostic tests MRI showed small acute to early subacute infarct in the right  thalamus/posterior limb of internal capsule and small subacute left occipital lobe infarct.    Patient Stated Goals wants to know what she can do and shouldn't do    Currently in Pain? Yes    Pain Score 5     Pain Location --   whole L side   Pain Orientation Left    Pain Descriptors / Indicators Constant    Pain Type Neuropathic pain    Pain Onset More than a month ago                               [facility] Adult PT Treatment/Exercise - 07/13/21 1238       Knee/Hip Exercises: Aerobic   Nustep with BLE and BUE at gear 5.0 for 5 minutes for strengthening, activity tolerance  Balance Exercises - 07/13/21 1244       Balance Exercises: Standing   Standing Eyes Closed Foam/compliant surface;Wide (BOA);30 secs;Narrow base of support (BOS)    Standing Eyes Closed Limitations on blue air ex; narrow BOS/feet together 3 x 30 seconds    SLS with Vectors Foam/compliant surface;Intermittent upper extremity assist;Limitations;Upper extremity assist 1    SLS with Vectors Limitations on blue beam: x12 reps B alternating cone taps, with fingertip support    Stepping Strategy Posterior;Foam/compliant surface;Anterior;Limitations;10 reps    Stepping Strategy Limitations on blue foam beam x10 reps each direction, UE support > none    Tandem Gait Forward;Retro;Limitations    Tandem Gait Limitations down and back 3 reps on blue mat, using intermittent UE support    Partial Tandem Stance Eyes open     Partial Tandem Stance Limitations blue air ex with LLE posteriorly; x10 reps head turns, x10 reps head nods    Marching Forwards;Retro;Limitations    Marching Limitations down and back 3 reps on blue mat, cues for slowed and controlled, incr difficulty with going backwards     Other Standing Exercises on red beam: x10 reps sit <> stands with BUE support, intermittent touch to chair for balance once in standing                 PT Short Term Goals - 05/13/21 1113       PT SHORT TERM GOAL #1   Title Pt will improve FGA score to at least a 20/30 in order to demo decr fall risk. ALL STGS DUE 05/15/21    Baseline 05/13/21: 17/30, improved from 13/30 just not to goal    Time --    Period --    Status Partially Met    Target Date 05/15/21      PT SHORT TERM GOAL #2   Title Pt will improve gait speed with no AD to at least 2.9 ft/sec in order to demo improved community mobility.    Baseline 05/13/21: 2.68 ft/sec with cane, improved from 1.18 ft/sec just not to goal    Time --    Period --    Status Partially Met    Target Date 05/15/21      PT SHORT TERM GOAL #3   Title Pt will improve mCTSIB condition 4 to at least 15 seconds in order to demo improved vestibular input for balance.    Baseline 05/13/21: met in session today    Time --    Period --    Status Achieved      PT SHORT TERM GOAL #4   Title Pt will demo compliance with walking program to improve walking endurance    Baseline 05/13/21: has a walking program, up to 8 minutes a day    Status Achieved    Target Date 05/15/21      PT SHORT TERM GOAL #5   Title Pt will demo at least 150 feet more in 6 minutes with a cane to improve walking endurance    Baseline 05/13/21: 961 feet (increased from 500 feet at last assessment)    Time --    Status Achieved    Target Date --               PT Long Term Goals - 06/29/21 1059       PT LONG TERM GOAL #1   Title Pt will be independent with final HEP in order to build  upon functional gains made in therapy. ALL  LTGS DUE 08/10/21    Baseline Reports inconsistency 06/29/21    Time 8    Period Weeks    Status On-going      PT LONG TERM GOAL #2   Title Pt will improve FGA score to at least a 23/30 in order to demo decr fall risk.    Baseline 17/30; 22/30 on 06/29/21    Time 8    Period Weeks    Status On-going    Target Date 08/10/21      PT LONG TERM GOAL #3   Title Pt will improve gait speed with no AD to at least 3.2 ft/sec in order to demo improved community mobility.    Baseline 2.66 ft/sec with no AD; 2.85 ft/sec with cane    Time 8    Period Weeks    Status Revised    Target Date 08/10/21      PT LONG TERM GOAL #4   Title Pt will improve FOTO score to at least a 65 in order to demo improved functional outcomes.    Baseline 53; 55 on 06/29/21    Time 6    Period Weeks    Status Revised    Target Date 08/10/21      PT LONG TERM GOAL #5   Title Pt will demo at least 750 feet with st. cane in 6 minutes to improve community ambulation    Baseline 576feet st. cane (04/17/21)    Time 6    Period Weeks    Status On-going    Target Date 08/10/21                   Plan - 07/13/21 1314     Clinical Impression Statement Today's skilled session continued to focus on balance strategies on compliant surfaces with vision removed and with SLS activities. Incr difficulties with SLS on LLE, needing intermittent UE support for balance. Pt tolerated session well, will continue to progress towards LTGs.    Personal Factors and Comorbidities Comorbidity 3+;Past/Current Experience;Time since onset of injury/illness/exacerbation    Comorbidities history of hypertension, hyperlipidemia, type 2 diabetes, anxiety and depression, history of a stroke in 2017, Angioedema    Examination-Activity Limitations Stairs;Transfers;Locomotion Level    Examination-Participation Restrictions Community Activity;Driving    Rehab Potential Good    PT Frequency 1x / week     PT Duration 6 weeks    PT Treatment/Interventions ADLs/Self Care Home Management;Gait training;Stair training;Functional mobility training;Therapeutic activities;Therapeutic exercise;Neuromuscular re-education;Balance training;DME Instruction;Patient/family education;Vestibular;Visual/perceptual remediation/compensation;Moist Heat;Aquatic Therapy;Orthotic Fit/Training;Manual techniques;Passive range of motion;Energy conservation;Joint Manipulations;Spinal Manipulations    PT Next Visit Plan Trial outdoors with her walking poles. Work on [employer] activities, dynamic gait with no AD, balance on compliant surfaces with vision removed.    PT Home Exercise Plan [id number]    Consulted and Agree with Plan of Care Patient             Patient will benefit from skilled therapeutic intervention in order to improve the following deficits and impairments:  Abnormal gait, Decreased balance, Difficulty walking, Dizziness, Decreased strength, Impaired sensation, Postural dysfunction, Pain  Visit Diagnosis: Muscle weakness (generalized)  Unsteadiness on feet  Other abnormalities of gait and mobility     Problem List Patient Active Problem List   Diagnosis Date Noted   Uterine leiomyoma 06/19/2021   Thalamic pain syndrome (hyperesthetic) 04/16/2021   Cerebral thrombosis with cerebral infarction 03/13/2021   Acute encephalopathy 03/12/2021   Bacteremia    Malnutrition of moderate degree 02/09/2021  Acute ischemic stroke ([place]) 02/08/2021   Infarction of right thalamus ([place]) 02/07/2021   Essential hypertension 02/07/2021   Paresthesia 02/02/2021   Gait abnormality 02/02/2021   Pain in joint of left shoulder 10/10/2019   Pain in joint of right shoulder 07/13/2018   Confusion 05/08/2018   Stroke ([place]) 03/17/2017   Type 2 diabetes mellitus with hyperlipidemia ([place]) 03/17/2017   Confusion state 02/16/2017   Hereditary angioedema ([place]) 02/16/2017   Panic 02/04/2017   Adjustment disorder with anxious  mood 02/04/2017   Sinusitis, chronic 12/28/2011   Rhinitis, allergic 12/28/2011   Extrinsic asthma 12/07/2011    Arliss Journey, PT, DPT  07/13/2021, 1:15 PM  [facility] [address] [place], [name], [address] Phone: [phone]   Fax:  [phone]  Name: Sheila Guerrero MRN: [id number] Date of Birth: 1961-07-26

## 2021-07-14 ENCOUNTER — Other Ambulatory Visit: Payer: Self-pay | Admitting: Neurology

## 2021-07-14 ENCOUNTER — Telehealth: Payer: Self-pay | Admitting: Neurology

## 2021-07-14 NOTE — Telephone Encounter (Signed)
Pt states her insurance is asking for a PA for her DULoxetine (CYMBALTA) 30 MG capsule so pt is able to get 2 pills a day.

## 2021-07-15 MED ORDER — DULOXETINE HCL 60 MG PO CPEP: 60.0000 mg | ORAL_CAPSULE | Freq: Every day | ORAL | 1 refills | Status: DC

## 2021-07-15 NOTE — Telephone Encounter (Signed)
I called the patient.  Her insurance will not cover 30 mg twice daily of Cymbalta, I will send in a 60 mg capsule have her take 1 daily.

## 2021-07-15 NOTE — Addendum Note (Signed)
Addended by: Kathrynn Ducking on: 07/15/2021 09:14 AM   Modules accepted: Orders

## 2021-07-17 NOTE — Progress Notes (Signed)
Cardiology Office Note   Date:  07/17/2021   ID:  [name], DOB 25-Sep-1961, MRN [id number]  PCP:  Antony Contras, MD  Cardiologist:   Sheila Meulemans Martinique, MD   No chief complaint on file.     History of Present Illness: Sheila Guerrero is a 60 y.o. female who is seen at the request of Dr Tommy Medal for evaluation of possible bacterial endocarditis. She has a history of HTN, DM type 2, HLD, and prior CVA in 2017. She was admitted in March 2022 with left sided paresthesias and progressive left sided weakness. She was admitted with acute CVA. MRI brain demonstrated-small acute to early subacute infarct in the right thalamus/posterior limb of the internal capsule, small subacute left occipital lobe infarct, moderately advanced chronic small vessel ischemic disease progressed from 2018 and numerous chronic microhemorrhages consistent with chronic hypertension. Carotid dopplers were negative. Echo showed EF 40-45% (new since 2018). Subsequent TEE reportedly showed multiple small vegetations on both MV leaflets with moderate MR and a ? larger sessile vegetation on the tricuspid valve. Patient had no fever or other source of infection. Multiple blood cultures were negative. She was treated for culture negative endocarditis with IV Rocephin and daptomycin for 6 weeks. DC'd on Entresto and beta blocker. She was readmitted in April with confusion and difficulty speaking and walking. MRI showed subacute left thalamic infarct. CT head negative. EEG was normal. TEE was repeated on April 29 showing EF 45-50%. Reportedly smaller vegetation on medial MV leaflet and tricuspid valve. ? Small vegetation on noncoronary AV cusp. Mild MR and TR.  Since her stroke she is doing well. When last seen by Dr Jannifer Franklin was switched to Plavix monotherapy.   On follow up today she is doing well. Denies any dizziness, chest pain or palpitations. Notes she is progressing well with PT and balance has improved. Is having light  intolerance particularly in the morning and is planning to see ophthalmology. She states she developed dizziness on aldactone. She stopped it and symptoms resolved. Tried it again and symptoms recurred. Is tolerating higher dose of Entresto.    Past Medical History:  Diagnosis Date   Angioedema    Anxiety    Asthma    Depression    Diabetes mellitus, type II ([place])    Dyslipidemia    Family history of adverse reaction to anesthesia    mother severely confused after general anesthesia   History of CVA (cerebrovascular accident)    HTN (hypertension) 2013   Panic attacks    Pneumonia    Septic embolism ([place]) 02/10/2021   Situational stress    Subacute endocarditis 02/10/2021    Past Surgical History:  Procedure Laterality Date   BUBBLE STUDY  02/10/2021   Procedure: BUBBLE STUDY;  Surgeon: Dixie Dials, MD;  Location: [facility];  Service: Cardiovascular;;   CHOLECYSTECTOMY  2010   TEE WITHOUT CARDIOVERSION N/A 02/10/2021   Procedure: TRANSESOPHAGEAL ECHOCARDIOGRAM (TEE);  Surgeon: Dixie Dials, MD;  Location[facility] ENDOSCOPY;  Service: Cardiovascular;  Laterality: N/A;   TEE WITHOUT CARDIOVERSION N/A 03/27/2021   Procedure: TRANSESOPHAGEAL ECHOCARDIOGRAM (TEE);  Surgeon: Dixie Dials, MD;  Location: [facility] ENDOSCOPY;  Service: Cardiovascular;  Laterality: N/A;     Current Outpatient Medications  Medication Sig Dispense Refill   aspirin EC 81 MG EC tablet Take 1 tablet (81 mg total) by mouth daily. Swallow whole. 30 tablet 11   atorvastatin (LIPITOR) 80 MG tablet Take 1 tablet (80 mg total) by mouth daily. 30 tablet  11   carvedilol (COREG) 6.25 MG tablet Take 6.25 mg by mouth 2 (two) times daily with a meal.     clopidogrel (PLAVIX) 75 MG tablet Take 1 tablet by mouth daily.     Cobalamin Combinations (VITAMIN B12-FOLIC ACID) XX123456 MCG TABS Take by mouth.     DULoxetine (CYMBALTA) 60 MG capsule Take 1 capsule (60 mg total) by mouth daily. 90 capsule 1   EPINEPHrine 0.3 mg/0.3 mL IJ  SOAJ injection Inject 0.3 mg into the muscle daily as needed for anaphylaxis.     Insulin Pen Needle (PEN NEEDLES) 29G X 12MM MISC 1 each by Does not apply route daily. 30 each 2   LANTUS SOLOSTAR 100 UNIT/ML Solostar Pen Inject 30 Units into the skin at bedtime.     pregabalin (LYRICA) 150 MG capsule Take 150 mg by mouth 3 (three) times daily.     sacubitril-valsartan (ENTRESTO) 49-51 MG Take 1 tablet by mouth 2 (two) times daily. 180 tablet 3   traMADol (ULTRAM) 50 MG tablet Take 50 mg by mouth 2 (two) times daily as needed for moderate pain.     No current facility-administered medications for this visit.    Allergies:   Sulfa antibiotics, Dulaglutide, Empagliflozin, Iodinated diagnostic agents, and Latex    Social History:  The patient  reports that she has never smoked. She has never used smokeless tobacco. She reports current alcohol use. She reports that she does not use drugs.   Family History:  The patient's family history includes Diabetes in her maternal aunt; Hypertension in her father and mother; Other in her mother; Stroke in her father.    ROS:  Please see the history of present illness.   Otherwise, review of systems are positive for none.   All other systems are reviewed and negative.    PHYSICAL EXAM: VS:  LMP 11/25/2011  , BMI There is no height or weight on file to calculate BMI. GEN: Well nourished, well developed, in no acute distress  HEENT: normal  Neck: no JVD, carotid bruits, or masses Cardiac: RRR; no murmurs, rubs, or gallops,no edema  Respiratory:  clear to auscultation bilaterally, normal work of breathing GI: soft, nontender, nondistended, + BS MS: no deformity or atrophy  Skin: warm and dry, no rash Neuro:  Strength and sensation are intact Psych: euthymic mood, full affect   EKG:  EKG is not ordered today.    Recent Labs: 03/12/2021: ALT 30; BUN 17; Creatinine, Ser 0.78; Hemoglobin 15.7; Platelets 293; Potassium 3.8; Sodium 138    Lipid  Panel    Component Value Date/Time   CHOL 284 (H) 03/13/2021 0445   TRIG 387 (H) 03/13/2021 0445   HDL 34 (L) 03/13/2021 0445   CHOLHDL 8.4 03/13/2021 0445   VLDL 77 (H) 03/13/2021 0445   LDLCALC 173 (H) 03/13/2021 0445      Wt Readings from Last 3 Encounters:  06/16/21 182 lb (82.6 kg)  05/19/21 181 lb (82.1 kg)  04/16/21 179 lb 12.8 oz (81.6 kg)      Other studies Reviewed: Additional studies/ records that were reviewed today include:  Echo 02/08/21: IMPRESSIONS     1. Left ventricular ejection fraction, by estimation, is 40 to 45%. The  left ventricle has mildly decreased function. The left ventricle  demonstrates global hypokinesis. There is moderate concentric left  ventricular hypertrophy. Indeterminate diastolic  filling due to E-A fusion.   2. Right ventricular systolic function is normal. The right ventricular  size is normal. Tricuspid  regurgitation signal is inadequate for assessing  PA pressure.   3. The mitral valve is grossly normal. Trivial mitral valve  regurgitation. No evidence of mitral stenosis.   4. The aortic valve is tricuspid. Aortic valve regurgitation is not  visualized. No aortic stenosis is present.   5. The inferior vena cava is normal in size with greater than 50%  respiratory variability, suggesting right atrial pressure of 3 mmHg.   Conclusion(s)/Recommendation(s): No intracardiac source of embolism    TEE 02/10/21: IMPRESSIONS     1. Left ventricular ejection fraction, by estimation, is 45 to 50%. The  left ventricle has mildly decreased function. The left ventricle  demonstrates global hypokinesis. Left ventricular diastolic function could  not be evaluated.   2. Right ventricular systolic function is normal. The right ventricular  size is normal.   3. Left atrial size was moderately dilated. No left atrial/left atrial  appendage thrombus was detected.   4. Right atrial size was mildly dilated.   5. The pericardial effusion is  posterior to the left ventricle. There is  no evidence of cardiac tamponade.   6. Mobile small MV vegetations seen on both leaflets. The mitral valve is  normal in structure. Moderate mitral valve regurgitation.   7. Sessile cauliflower shaped vegetation measuring 0.428 x 0.712 cm on  anterior leaflet mid portion of TV.   8. The aortic valve is tricuspid. Aortic valve regurgitation is not  visualized. No aortic stenosis is present.   9. There is mild (Grade II) atheroma plaque involving the descending  aorta and ascending aorta.  10. The inferior vena cava is normal in size with greater than 50%  respiratory variability, suggesting right atrial pressure of 3 mmHg.  11. Evidence of atrial level shunting detected by color flow Doppler.  Agitated saline contrast bubble study was negative, with no evidence of  any interatrial shunt. There is a small patent foramen ovale with  predominantly right to left shunting across  the atrial septum.    TEE 03/27/21: IMPRESSIONS     1. Left ventricular ejection fraction, by estimation, is 45 to 50%. The  left ventricle has mildly decreased function. The left ventricle  demonstrates global hypokinesis. Left ventricular diastolic function could  not be evaluated.   2. Right ventricular systolic function is normal. The right ventricular  size is normal.   3. Left atrial size was mildly dilated. No left atrial/left atrial  appendage thrombus was detected.   4. The mitral valve is grossly normal. Mild mitral valve regurgitation.   5. Smaller than before sesile vegetation over medial leaflet.   6. Very small AV vegetation on non-coroanry cusp. The aortic valve is  tricuspid. There is mild calcification of the aortic valve. There is mild  thickening of the aortic valve. Aortic valve regurgitation is not  visualized.   7. There is mild (Grade II) atheroma plaque involving the ascending aorta  and descending aorta.   Conclusion(s)/Recommendation(s):  Findings are concerning for  vegetation/infective endocarditis as detailed above. Vegetations 25%  smaller than before.    ASSESSMENT AND PLAN:  1.  Recurrent CVA. I reviewed history and imaging studies. I feel her strokes are due to small vessel disease in the cerebral vessels due to chronic HTN, DM, and HLD. I also reviewed her Echo and TEE images. I  do not feel that there is convincing evidence of vegetations. She has some fine filamentous strands noted on the MV of uncertain significance.  Given lack of any clinical  evidence of infection and normal blood cultures I think the weight of evidence is that she never had endocarditis. There is no significant carotid disease or other cardio-embolic source. Recommend she continue Plavix .  Needs aggressive risk factor modification with control of DM, HTN and HLD.   2. Chronic systolic CHF. EF is mildly reduced at 45-50%. I think this is also related to chronic HTN and DM. She has class 1 symptoms. Will continue Entresto at 49/51 mg bid. We will increase Coreg dose to 12.5 mg bid. She was intolerant of low dose aldactone due to dizziness. Check chemistries today. Follow up with pharmacy in 4 weeks. Follow up with me in 3 months with Echo.  3. HTN. Controlled.  4. Hypercholesterolemia. Now on high dose lipitor. Labs in March showed LDL 55 but repeat in April LDL was 173. Not sure why the discrepancy. Will repeat lipid panel and LFTs today.Goal LDL < 70 with recurrent CVA  5. DM per primary care. A1c good at 6.1%.     Signed, Sheila Lehigh Martinique, MD  07/17/2021 4:06 PM    [facility] [address] [place], [name], [address] Phone [phone], Fax [phone]

## 2021-07-20 ENCOUNTER — Ambulatory Visit: Payer: 59 | Admitting: Physical Therapy

## 2021-07-22 ENCOUNTER — Encounter: Payer: Self-pay | Admitting: Cardiology

## 2021-07-22 ENCOUNTER — Ambulatory Visit (INDEPENDENT_AMBULATORY_CARE_PROVIDER_SITE_OTHER): Payer: 59 | Admitting: Cardiology

## 2021-07-22 ENCOUNTER — Other Ambulatory Visit: Payer: Self-pay

## 2021-07-22 VITALS — BP 134/78 | HR 90 | Ht 65.0 in | Wt 181.0 lb

## 2021-07-22 DIAGNOSIS — I5022 Chronic systolic (congestive) heart failure: Secondary | ICD-10-CM

## 2021-07-22 DIAGNOSIS — I10 Essential (primary) hypertension: Secondary | ICD-10-CM

## 2021-07-22 DIAGNOSIS — I6350 Cerebral infarction due to unspecified occlusion or stenosis of unspecified cerebral artery: Secondary | ICD-10-CM | POA: Diagnosis not present

## 2021-07-22 LAB — LIPID PANEL
Chol/HDL Ratio: 3.2 ratio (ref 0.0–4.4)
Cholesterol, Total: 134 mg/dL (ref 100–199)
HDL: 42 mg/dL (ref >39)
LDL Chol Calc (NIH): 62 mg/dL (ref 0–99)
Triglycerides: 179 mg/dL — ABNORMAL HIGH (ref 0–149)
VLDL Cholesterol Cal: 30 mg/dL (ref 5–40)

## 2021-07-22 LAB — COMPREHENSIVE METABOLIC PANEL
ALT: 53 IU/L — ABNORMAL HIGH (ref 0–32)
AST: 33 IU/L (ref 0–40)
Albumin/Globulin Ratio: 1.9 (ref 1.2–2.2)
Albumin: 4.2 g/dL (ref 3.8–4.9)
Alkaline Phosphatase: 89 IU/L (ref 44–121)
BUN/Creatinine Ratio: 19 (ref 9–23)
BUN: 15 mg/dL (ref 6–24)
Bilirubin Total: 0.5 mg/dL (ref 0.0–1.2)
CO2: 25 mmol/L (ref 20–29)
Calcium: 7.6 mg/dL — ABNORMAL LOW (ref 8.7–10.2)
Chloride: 104 mmol/L (ref 96–106)
Creatinine, Ser: 0.81 mg/dL (ref 0.57–1.00)
Globulin, Total: 2.2 g/dL (ref 1.5–4.5)
Glucose: 184 mg/dL — ABNORMAL HIGH (ref 65–99)
Potassium: 3.7 mmol/L (ref 3.5–5.2)
Sodium: 143 mmol/L (ref 134–144)
Total Protein: 6.4 g/dL (ref 6.0–8.5)
eGFR: 84 mL/min/{1.73_m2} (ref >59)

## 2021-07-22 MED ORDER — CARVEDILOL 12.5 MG PO TABS: 12.5000 mg | ORAL_TABLET | Freq: Two times a day (BID) | ORAL | 3 refills | Status: DC

## 2021-07-22 NOTE — Patient Instructions (Addendum)
Increase Carvedilol to 12.5 mg twice a day.  We will check lab work today.   Follow up with Pharm D in one month for medication titration  I will see you in 3 months with repeat Echo

## 2021-07-27 ENCOUNTER — Ambulatory Visit: Payer: 59 | Admitting: Physical Therapy

## 2021-07-27 ENCOUNTER — Other Ambulatory Visit: Payer: Self-pay

## 2021-07-27 ENCOUNTER — Encounter: Payer: Self-pay | Admitting: Physical Therapy

## 2021-07-27 DIAGNOSIS — R2681 Unsteadiness on feet: Secondary | ICD-10-CM

## 2021-07-27 DIAGNOSIS — R2689 Other abnormalities of gait and mobility: Secondary | ICD-10-CM

## 2021-07-27 DIAGNOSIS — M6281 Muscle weakness (generalized): Secondary | ICD-10-CM

## 2021-07-27 DIAGNOSIS — R278 Other lack of coordination: Secondary | ICD-10-CM

## 2021-07-27 NOTE — Therapy (Addendum)
[facility] [address] [place], [name], [address] Phone: [phone]   Fax:  [phone]  Physical Therapy Treatment  Patient Details  Name: Sheila Guerrero MRN: [id number] Date of Birth: 16-Nov-1961 Referring Provider (PT): Barb Merino (not reffered by, but previous neurologist has been Dr. Krista Blue)   Encounter Date: 07/27/2021   PT End of Session - 07/27/21 1529     Visit Number 12    Number of Visits 15    Date for PT Re-Evaluation 08/10/21    Authorization Type U[employer] - $40 co pay per discipline; Recert from 8/0/22 to 1/79/81    PT Start Time 1448    PT Stop Time 1529    PT Time Calculation (min) 41 min    Equipment Utilized During Treatment Gait belt    Activity Tolerance Patient tolerated treatment well    Behavior During Therapy WFL for tasks assessed/performed             Past Medical History:  Diagnosis Date   Angioedema    Anxiety    Asthma    Depression    Diabetes mellitus, type II ([place])    Dyslipidemia    Family history of adverse reaction to anesthesia    mother severely confused after general anesthesia   History of CVA (cerebrovascular accident)    HTN (hypertension) 2013   Panic attacks    Pneumonia    Septic embolism ([place]) 02/10/2021   Situational stress    Subacute endocarditis 02/10/2021    Past Surgical History:  Procedure Laterality Date   BUBBLE STUDY  02/10/2021   Procedure: BUBBLE STUDY;  Surgeon: Dixie Dials, MD;  Location: [facility];  Service: Cardiovascular;;   CHOLECYSTECTOMY  2010   TEE WITHOUT CARDIOVERSION N/A 02/10/2021   Procedure: TRANSESOPHAGEAL ECHOCARDIOGRAM (TEE);  Surgeon: Dixie Dials, MD;  Location: [facility] ENDOSCOPY;  Service: Cardiovascular;  Laterality: N/A;   TEE WITHOUT CARDIOVERSION N/A 03/27/2021   Procedure: TRANSESOPHAGEAL ECHOCARDIOGRAM (TEE);  Surgeon: Dixie Dials, MD;  Location: [facility] ENDOSCOPY;  Service: Cardiovascular;  Laterality: N/A;     There were no vitals filed for this visit.   Subjective Assessment - 07/27/21 1450     Subjective No falls, no changes. Doing more around the house and being more active.    Pertinent History history of hypertension, hyperlipidemia, type 2 diabetes on Metformin at home, anxiety and depression, history of a stroke in 2017, Angioedema    Limitations Walking    Diagnostic tests MRI showed small acute to early subacute infarct in the right  thalamus/posterior limb of internal capsule and small subacute left occipital lobe infarct.    Patient Stated Goals wants to know what she can do and shouldn't do    Currently in Pain? Yes    Pain Score 5     Pain Location --   whole L side   Pain Orientation Left    Pain Descriptors / Indicators Constant    Pain Type Neuropathic pain    Pain Onset More than a month ago                               [facility] Adult PT Treatment/Exercise - 07/27/21 1503       High Level Balance   High Level Balance Activities Negotitating around obstacles;Negotiating over obstacles    High Level Balance Comments stepping over 4 obstacles (2 smaller and 2 larger orange one) with reciporcal pattern  and alternating tapping cones for SLS and then stepping over 4 cones total, down and back x2 reps, min guard/min A needed at times for balance esp with SLS cone taps, cues to clear cones with hip/knee flexion vs circumduction                 Balance Exercises - 07/27/21 1514       Balance Exercises: Standing   SLS with Vectors Foam/compliant surface;Intermittent upper extremity assist;Limitations;Upper extremity assist 1    SLS with Vectors Limitations on stepping stones next to countertop, down and back x4 reps, cues for slowed pace, incr difficulty with placing LLE to obstacle, needing UE support    Rockerboard EO;Anterior/posterior    Rockerboard Limitations keeping LLE as stance leg, stepping RLE off and then back on x10 reps, x10 reps head  turns, x10 reps head nods    Retro Gait 4 reps;Limitations    Retro Gait Limitations with PT tech providing black t band resistance at pt's pelvis and therapist guarding x4 reps, slightly incr resistance each time    Other Standing Exercises with use of black band: resisted forward gait down and back at longer countertop 2 x 40' with intermittent UE support, lateral stepping strategies using black tband resistance at pelvis and providing random perturbations x8 reps B                 PT Short Term Goals - 05/13/21 1113       PT SHORT TERM GOAL #1   Title Pt will improve FGA score to at least a 20/30 in order to demo decr fall risk. ALL STGS DUE 05/15/21    Baseline 05/13/21: 17/30, improved from 13/30 just not to goal    Time --    Period --    Status Partially Met    Target Date 05/15/21      PT SHORT TERM GOAL #2   Title Pt will improve gait speed with no AD to at least 2.9 ft/sec in order to demo improved community mobility.    Baseline 05/13/21: 2.68 ft/sec with cane, improved from 1.18 ft/sec just not to goal    Time --    Period --    Status Partially Met    Target Date 05/15/21      PT SHORT TERM GOAL #3   Title Pt will improve mCTSIB condition 4 to at least 15 seconds in order to demo improved vestibular input for balance.    Baseline 05/13/21: met in session today    Time --    Period --    Status Achieved      PT SHORT TERM GOAL #4   Title Pt will demo compliance with walking program to improve walking endurance    Baseline 05/13/21: has a walking program, up to 8 minutes a day    Status Achieved    Target Date 05/15/21      PT SHORT TERM GOAL #5   Title Pt will demo at least 150 feet more in 6 minutes with a cane to improve walking endurance    Baseline 05/13/21: 961 feet (increased from 500 feet at last assessment)    Time --    Status Achieved    Target Date --               PT Long Term Goals - 06/29/21 1059       PT LONG TERM GOAL #1   Title Pt  will be independent with final  HEP in order to build upon functional gains made in therapy. ALL LTGS DUE 08/10/21    Baseline Reports inconsistency 06/29/21    Time 8    Period Weeks    Status On-going      PT LONG TERM GOAL #2   Title Pt will improve FGA score to at least a 23/30 in order to demo decr fall risk.    Baseline 17/30; 22/30 on 06/29/21    Time 8    Period Weeks    Status On-going    Target Date 08/10/21      PT LONG TERM GOAL #3   Title Pt will improve gait speed with no AD to at least 3.2 ft/sec in order to demo improved community mobility.    Baseline 2.66 ft/sec with no AD; 2.85 ft/sec with cane    Time 8    Period Weeks    Status Revised    Target Date 08/10/21      PT LONG TERM GOAL #4   Title Pt will improve FOTO score to at least a 65 in order to demo improved functional outcomes.    Baseline 53; 55 on 06/29/21    Time 6    Period Weeks    Status Revised    Target Date 08/10/21      PT LONG TERM GOAL #5   Title Pt will demo at least 750 feet with st. cane in 6 minutes to improve community ambulation    Baseline 572feet st. cane (04/17/21)    Time 6    Period Weeks    Status On-going    Target Date 08/10/21                   Plan - 07/27/21 1653     Clinical Impression Statement Continued to focus on dynamic balance strategies with obstacle neogtiation, SLS tasks, balance on compliant surfaces, and resisted forward/retro gait. Pt tolerated session well, did need min guard/min A at times for SLS activities, esp on LLE. Will continue to progress towards LTGs.    Personal Factors and Comorbidities Comorbidity 3+;Past/Current Experience;Time since onset of injury/illness/exacerbation    Comorbidities history of hypertension, hyperlipidemia, type 2 diabetes, anxiety and depression, history of a stroke in 2017, Angioedema    Examination-Activity Limitations Stairs;Transfers;Locomotion Level    Examination-Participation Restrictions Community  Activity;Driving    Rehab Potential Good    PT Frequency 1x / week    PT Duration 6 weeks    PT Treatment/Interventions ADLs/Self Care Home Management;Gait training;Stair training;Functional mobility training;Therapeutic activities;Therapeutic exercise;Neuromuscular re-education;Balance training;DME Instruction;Patient/family education;Vestibular;Visual/perceptual remediation/compensation;Moist Heat;Aquatic Therapy;Orthotic Fit/Training;Manual techniques;Passive range of motion;Energy conservation;Joint Manipulations;Spinal Manipulations    PT Next Visit Plan look at HEP and update as needed (add SLS). gait outdoors with no AD. continue resisted gait. Work on [employer] activities, dynamic gait with no AD, balance on compliant surfaces with vision removed.    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   Cerebral thrombosis with cerebral infarction 03/13/2021   Acute encephalopathy 03/12/2021   Bacteremia    Malnutrition of moderate degree 02/09/2021   Acute ischemic stroke ([place]) 02/08/2021   Infarction of right thalamus ([place]) 02/07/2021   Essential hypertension 02/07/2021   Paresthesia 02/02/2021   Gait abnormality 02/02/2021   Pain in joint of left shoulder 10/10/2019   Pain in joint of right shoulder 07/13/2018   Confusion 05/08/2018   Stroke ([place]) 03/17/2017   Type 2 diabetes mellitus with  hyperlipidemia ([place]) 03/17/2017   Confusion state 02/16/2017   Hereditary angioedema ([place]) 02/16/2017   Panic 02/04/2017   Adjustment disorder with anxious mood 02/04/2017   Sinusitis, chronic 12/28/2011   Rhinitis, allergic 12/28/2011   Extrinsic asthma 12/07/2011    Arliss Journey, PT , DPT 07/27/2021, 4:54 PM  [facility] [address] [place], [name], [address] Phone: [phone]   Fax:  [phone]  Name: KEIOSHA CANCRO MRN: [id number] Date of Birth: December 11, 1960

## 2021-08-04 ENCOUNTER — Ambulatory Visit: Payer: 59 | Admitting: Physical Therapy

## 2021-08-10 ENCOUNTER — Encounter: Payer: Self-pay | Admitting: Physical Therapy

## 2021-08-10 ENCOUNTER — Other Ambulatory Visit: Payer: Self-pay

## 2021-08-10 ENCOUNTER — Ambulatory Visit: Payer: 59 | Attending: Internal Medicine | Admitting: Physical Therapy

## 2021-08-10 VITALS — BP 114/67 | HR 100

## 2021-08-10 DIAGNOSIS — R2689 Other abnormalities of gait and mobility: Secondary | ICD-10-CM | POA: Insufficient documentation

## 2021-08-10 DIAGNOSIS — R2681 Unsteadiness on feet: Secondary | ICD-10-CM | POA: Insufficient documentation

## 2021-08-10 DIAGNOSIS — M6281 Muscle weakness (generalized): Secondary | ICD-10-CM | POA: Insufficient documentation

## 2021-08-10 NOTE — Therapy (Addendum)
[facility] [address] [place], [name], [address] Phone: [phone]   Fax:  [phone]  Physical Therapy Treatment/Discharge Summary  Patient Details  Name: Sheila Guerrero MRN: [id number] Date of Birth: 03/01/1961 Referring Provider (PT): Barb Merino   Encounter Date: 08/10/2021   PT End of Session - 08/10/21 1311     Visit Number 13    Number of Visits 15    Date for PT Re-Evaluation 08/10/21    Authorization Type U[employer] - $40 co pay per discipline; Recert from 04/02/96 to 03/14/37    PT Start Time 1231    PT Stop Time 1309    PT Time Calculation (min) 38 min    Equipment Utilized During Treatment Gait belt    Activity Tolerance Patient tolerated treatment well    Behavior During Therapy WFL for tasks assessed/performed             Past Medical History:  Diagnosis Date   Angioedema    Anxiety    Asthma    Depression    Diabetes mellitus, type II ([place])    Dyslipidemia    Family history of adverse reaction to anesthesia    mother severely confused after general anesthesia   History of CVA (cerebrovascular accident)    HTN (hypertension) 2013   Panic attacks    Pneumonia    Septic embolism ([place]) 02/10/2021   Situational stress    Subacute endocarditis 02/10/2021    Past Surgical History:  Procedure Laterality Date   BUBBLE STUDY  02/10/2021   Procedure: BUBBLE STUDY;  Surgeon: Dixie Dials, MD;  Location: [facility];  Service: Cardiovascular;;   CHOLECYSTECTOMY  2010   TEE WITHOUT CARDIOVERSION N/A 02/10/2021   Procedure: TRANSESOPHAGEAL ECHOCARDIOGRAM (TEE);  Surgeon: Dixie Dials, MD;  Location: [facility] ENDOSCOPY;  Service: Cardiovascular;  Laterality: N/A;   TEE WITHOUT CARDIOVERSION N/A 03/27/2021   Procedure: TRANSESOPHAGEAL ECHOCARDIOGRAM (TEE);  Surgeon: Dixie Dials, MD;  Location: [facility]COPY;  Service: Cardiovascular;  Laterality: N/A;    Vitals:   08/10/21 1236 08/10/21 1238  BP:  115/74 114/67  Pulse: 95 100     Subjective Assessment - 08/10/21 1232     Subjective No falls. Getting pants on has been getting easier. Reports dizziness getting up from the couch or the bed.    Pertinent History history of hypertension, hyperlipidemia, type 2 diabetes on Metformin at home, anxiety and depression, history of a stroke in 2017, Angioedema    Limitations Walking    Diagnostic tests MRI showed small acute to early subacute infarct in the right  thalamus/posterior limb of internal capsule and small subacute left occipital lobe infarct.    Patient Stated Goals wants to know what she can do and shouldn't do    Currently in Pain? Yes    Pain Score 5     Pain Location --   whole L side   Pain Orientation Left    Pain Descriptors / Indicators Constant    Pain Type Neuropathic pain    Pain Onset More than a month ago                [facility] PT Assessment - 08/10/21 1249       Observation/Other Assessments   Focus on Therapeutic Outcomes (FOTO)  69.67   LE                08/10/21 1239  Assessment  Medical Diagnosis CVA  Referring Provider (PT) Barb Merino  Onset  Date/Surgical Date 02/07/21  6 Minute Walk- Baseline  6 Minute Walk- Baseline y  HR (bpm) 96  02 Sat (%RA) 97 %  Modified Borg Scale for Dyspnea 0- Nothing at all  6 Minute walk- Post Test  6 Minute Walk Post Test y  BP (mmHg) 117/82  HR (bpm) 102  02 Sat (%RA) 98 %  Modified Borg Scale for Dyspnea 0- Nothing at all  6 minute walk test results   Aerobic Endurance Distance Walked 1097  Endurance additional comments no AD  Functional Gait  Assessment  Gait assessed  Yes  Gait Level Surface 2  Change in Gait Speed 3  Gait with Horizontal Head Turns 3  Gait with Vertical Head Turns 3  Gait and Pivot Turn 3  Step Over Obstacle 3  Gait with Narrow Base of Support 0  Gait with Eyes Closed 2 (7.69)  Ambulating Backwards 2  Steps 2  Total Score 23  FGA comment: 23/30               [facility] Adult PT Treatment/Exercise - 08/10/21 1249       Ambulation/Gait   Ambulation/Gait Yes    Ambulation/Gait Assistance 6: Modified independent (Device/Increase time)    Assistive device None    Gait Pattern Step-through pattern;Decreased step length - left;Decreased step length - right;Abducted - left    Ambulation Surface Level;Indoor    Gait velocity 10.43 seconds = 3.14 ft/sec with no AD                PHYSICAL THERAPY DISCHARGE SUMMARY  Visits from Start of Care: 13  Current functional level related to goals / functional outcomes: See LTGs   Remaining deficits: Impaired balance.   Education / Equipment: HEP    Patient agrees to discharge. Patient goals were met. Patient is being discharged due to meeting the stated rehab goals.      PT Education - 08/10/21 1311     Education Details progress towards goals, D/C from PT    Person(s) Educated Patient    Methods Explanation    Comprehension Verbalized understanding              PT Short Term Goals - 05/13/21 1113       PT SHORT TERM GOAL #1   Title Pt will improve FGA score to at least a 20/30 in order to demo decr fall risk. ALL STGS DUE 05/15/21    Baseline 05/13/21: 17/30, improved from 13/30 just not to goal    Time --    Period --    Status Partially Met    Target Date 05/15/21      PT SHORT TERM GOAL #2   Title Pt will improve gait speed with no AD to at least 2.9 ft/sec in order to demo improved community mobility.    Baseline 05/13/21: 2.68 ft/sec with cane, improved from 1.18 ft/sec just not to goal    Time --    Period --    Status Partially Met    Target Date 05/15/21      PT SHORT TERM GOAL #3   Title Pt will improve mCTSIB condition 4 to at least 15 seconds in order to demo improved vestibular input for balance.    Baseline 05/13/21: met in session today    Time --    Period --    Status Achieved      PT SHORT TERM GOAL #4   Title Pt will demo  compliance with walking  program to improve walking endurance    Baseline 05/13/21: has a walking program, up to 8 minutes a day    Status Achieved    Target Date 05/15/21      PT SHORT TERM GOAL #5   Title Pt will demo at least 150 feet more in 6 minutes with a cane to improve walking endurance    Baseline 05/13/21: 961 feet (increased from 500 feet at last assessment)    Time --    Status Achieved    Target Date --               PT Long Term Goals - 08/10/21 1247       PT LONG TERM GOAL #1   Title Pt will be independent with final HEP in order to build upon functional gains made in therapy. ALL LTGS DUE 08/10/21    Baseline pt performing consistently    Time 8    Period Weeks    Status Achieved      PT LONG TERM GOAL #2   Title Pt will improve FGA score to at least a 23/30 in order to demo decr fall risk.    Baseline 17/30; 22/30 on 06/29/21, 23/30 on 08/10/21    Time 8    Period Weeks    Status Achieved      PT LONG TERM GOAL #3   Title Pt will improve gait speed with no AD to at least 3.2 ft/sec in order to demo improved community mobility.    Baseline 2.66 ft/sec with no AD; 2.85 ft/sec with cane; 3.14 ft/sec with no AD    Time 8    Period Weeks    Status Partially Met      PT LONG TERM GOAL #4   Title Pt will improve FOTO score to at least a 65 in order to demo improved functional outcomes.    Baseline 53; 55 on 06/29/21; 69.67 on 08/10/21    Time 6    Period Weeks    Status Achieved      PT LONG TERM GOAL #5   Title Pt will demo at least 750 feet with st. cane in 6 minutes to improve community ambulation    Baseline 565feet st. cane (04/17/21); 1097' no AD on 08/10/21    Time 6    Period Weeks    Status Achieved                   Plan - 08/10/21 1549     Clinical Impression Statement Today's skilled session focused on assessing pt's LTGs for anticipated D/C. Pt has met 4 out of 5 LTGs and partially met LTG #3 in regards to her gait speed with no AD. Pt  is consistently performing her HEP. Due to pt meeting her goals and being pleased with her progress with PT, will be discharged from PT at this time.    Personal Factors and Comorbidities Comorbidity 3+;Past/Current Experience;Time since onset of injury/illness/exacerbation    Comorbidities history of hypertension, hyperlipidemia, type 2 diabetes, anxiety and depression, history of a stroke in 2017, Angioedema    Examination-Activity Limitations Stairs;Transfers;Locomotion Level    Examination-Participation Restrictions Community Activity;Driving    Rehab Potential Good    PT Frequency 1x / week    PT Duration 6 weeks    PT Treatment/Interventions ADLs/Self Care Home Management;Gait training;Stair training;Functional mobility training;Therapeutic activities;Therapeutic exercise;Neuromuscular re-education;Balance training;DME Instruction;Patient/family education;Vestibular;Visual/perceptual remediation/compensation;Moist Heat;Aquatic Therapy;Orthotic Fit/Training;Manual techniques;Passive range of motion;Energy conservation;Joint Manipulations;Spinal Manipulations  PT Next Visit Plan D/C    PT Home Exercise Plan [id number]    Consulted and Agree with Plan of Care Patient             Patient will benefit from skilled therapeutic intervention in order to improve the following deficits and impairments:  Abnormal gait, Decreased balance, Difficulty walking, Dizziness, Decreased strength, Impaired sensation, Postural dysfunction, Pain  Visit Diagnosis: Muscle weakness (generalized)  Unsteadiness on feet  Other abnormalities of gait and mobility     Problem List Patient Active Problem List   Diagnosis Date Noted   Uterine leiomyoma 06/19/2021   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 [place], [name], [address] Phone: [phone]   Fax:  [phone]  Name: PATRECIA VEIGA MRN: [id number] Date of Birth: 04-Jun-1961

## 2021-08-26 ENCOUNTER — Ambulatory Visit (INDEPENDENT_AMBULATORY_CARE_PROVIDER_SITE_OTHER): Payer: 59 | Admitting: Pharmacist

## 2021-08-26 ENCOUNTER — Encounter: Payer: Self-pay | Admitting: Pharmacist

## 2021-08-26 ENCOUNTER — Other Ambulatory Visit: Payer: Self-pay

## 2021-08-26 VITALS — BP 158/92 | HR 99 | Resp 14 | Ht 65.0 in | Wt 183.3 lb

## 2021-08-26 DIAGNOSIS — I5022 Chronic systolic (congestive) heart failure: Secondary | ICD-10-CM | POA: Diagnosis not present

## 2021-08-26 NOTE — Progress Notes (Signed)
Patient ID: DJUANA LITTLETON                 DOB: 10-18-1961                      MRN: [id number]     HPI: Sheila Guerrero is a 60 y.o. female referred by Dr. Martinique to pharmacy clinic for HF medication management. PMH is significant for stroke, HTN, T2DM, and HLD. Most recent LVEF 45-50% on 03/27/21.  Patient presents today in good spirits but is hypertensive. Did not take medications this morning. Did not realize carvedilol and Entresto helped her BP.  Husband typically manages her medication because she has difficulty remembering things since her stroke.  Takes blood pressure once daily in the evening but does not remember any recent readings.  Says they're usually less than 130.  Takes blood sugar in the morning which ranges from 110-140.  Has been weighing herself at home and says she has not gained more than 5# recently.  Today she feels poorly.  Reports fatigue. Has occasional dizziness when going from a sitting to a standing position.  Denies any SOB or LEE.   Has been working on being more physically active.  Restarted yoga with her husband.  Is walking with her dog 2 miles a day (0.5 miles at a time).   Denies alcohol and tobacco.  Has been working on diet changes since A1c was previously very uncontrolled.  Has cut down on starchy foods and concentrates on proteins and non starchy vegetables.  If she does eat any carbohydrates she keeps portion sizes small.  Does not cook with salt.  Current CHF meds:  Carvedilol 12.5mg  BID Entresto 49-51mg  BID  Previously tried: spironolactone (dizziness), jardiance (yeast infections) BP goal: <130/80  Wt Readings from Last 3 Encounters:  07/22/21 181 lb (82.1 kg)  06/16/21 182 lb (82.6 kg)  05/19/21 181 lb (82.1 kg)   BP Readings from Last 3 Encounters:  08/10/21 114/67  07/22/21 134/78  06/16/21 130/83   Pulse Readings from Last 3 Encounters:  08/10/21 100  07/22/21 90  06/16/21 88    Renal function: CrCl cannot be  calculated (Patient's most recent lab result is older than the maximum 21 days allowed.).  Past Medical History:  Diagnosis Date   Angioedema    Anxiety    Asthma    Depression    Diabetes mellitus, type II ([place])    Dyslipidemia    Family history of adverse reaction to anesthesia    mother severely confused after general anesthesia   History of CVA (cerebrovascular accident)    HTN (hypertension) 2013   Panic attacks    Pneumonia    Septic embolism ([place]) 02/10/2021   Situational stress    Subacute endocarditis 02/10/2021    Current Outpatient Medications on File Prior to Visit  Medication Sig Dispense Refill   atorvastatin (LIPITOR) 80 MG tablet Take 1 tablet (80 mg total) by mouth daily. 30 tablet 11   carvedilol (COREG) 12.5 MG tablet Take 1 tablet (12.5 mg total) by mouth 2 (two) times daily. 180 tablet 3   clopidogrel (PLAVIX) 75 MG tablet Take 1 tablet by mouth daily.     Cobalamin Combinations (VITAMIN B12-FOLIC ACID) 035-465 MCG TABS Take by mouth.     DULoxetine (CYMBALTA) 60 MG capsule Take 1 capsule (60 mg total) by mouth daily. 90 capsule 1   EPINEPHrine 0.3 mg/0.3 mL IJ SOAJ injection Inject 0.3 mg into the  muscle daily as needed for anaphylaxis.     Insulin Pen Needle (PEN NEEDLES) 29G X 12MM MISC 1 each by Does not apply route daily. 30 each 2   LANTUS SOLOSTAR 100 UNIT/ML Solostar Pen Inject 30 Units into the skin at bedtime.     pregabalin (LYRICA) 150 MG capsule Take 150 mg by mouth 3 (three) times daily.     sacubitril-valsartan (ENTRESTO) 49-51 MG Take 1 tablet by mouth 2 (two) times daily. 180 tablet 3   traMADol (ULTRAM) 50 MG tablet Take 50 mg by mouth 2 (two) times daily as needed for moderate pain.     No current facility-administered medications on file prior to visit.    Allergies  Allergen Reactions   Sulfa Antibiotics Swelling   Dulaglutide     Other reaction(s): rash   Empagliflozin     Other reaction(s): recurrent yeast   Iodinated Diagnostic  Agents     Other reaction(s): angioedema with CT dye   Latex Rash     Assessment/Plan:  1. CHF -  Patient BP in room 158/92 which is above goal of <130/80.  When first rooming patient BP was elevated at 172/90.  However patient has not taken medications yet today and readings are much higher than at previous office visits.  Difficult to optimize medications.  Advised patient to take medications as soon as she gets home and monitor BP tonight.  Will contact patient tomorrow and decision will be made to increase Entresto or carvedilol.  Of note, patient has Jardiance listed as intolerant due to yeast infections. However blood sugar was previously very elevated which could have been part of the cause.    Continue Entresto 49-51mg  BID Continue carvedilol 12.5mg  BID Contact patient tomorrow to check BP readings  Karren Cobble, PharmD, BCACP, CDCES, [place] [address] [place], [place] [address] Phone: [phone]; Fax: [phone] 08/26/2021 3:14 PM

## 2021-08-26 NOTE — Patient Instructions (Addendum)
It was nice meeting you today!  We would like your blood pressure to be less than 130/80  Please continue your Entresto 49-51mg  twice a day and your carvedilol 6.25mg  twice a day  Continue to limit your salt and continue exercising daily  Continue to weigh yourself daily and call us if you gain more than 3# overnight or 5# in a week  I will call tomorrow to check your blood pressure readings and we can decide if we need to make any medication changes  Please call with any questions!  Karren Cobble, PharmD, BCACP, [place], [place] [address], [place], [place] [address] Phone: [phone]; Fax: [phone] 08/26/2021 2:36 PM

## 2021-10-29 ENCOUNTER — Other Ambulatory Visit (HOSPITAL_COMMUNITY): Payer: 59

## 2021-10-29 ENCOUNTER — Encounter (HOSPITAL_COMMUNITY): Payer: Self-pay | Admitting: Cardiology

## 2021-11-17 ENCOUNTER — Ambulatory Visit: Payer: 59 | Admitting: Adult Health

## 2021-12-16 ENCOUNTER — Ambulatory Visit: Payer: 59 | Admitting: Adult Health

## 2021-12-17 ENCOUNTER — Telehealth: Payer: Self-pay | Admitting: Cardiology

## 2021-12-17 MED ORDER — SACUBITRIL-VALSARTAN 97-103 MG PO TABS: 1.0000 | ORAL_TABLET | Freq: Two times a day (BID) | ORAL | 0 refills | Status: DC

## 2021-12-17 NOTE — Telephone Encounter (Signed)
Pt c/o BP issue: STAT if pt c/o blurred vision, one-sided weakness or slurred speech  1. What are your last 5 BP readings? 190/91 for a week but then double dosage of entresto and it went down 130/74  2. Are you having any other symptoms (ex. Dizziness, headache, blurred vision, passed out)? Dizziness   3. What is your BP issue? Patient calling in to see what she needs to do, do she keep taking a double dosage of entresto. Please advise

## 2021-12-17 NOTE — Telephone Encounter (Signed)
Pt informed of providers result & recommendations. Pt verbalized understanding. No further questions . New rx sent to requested pharmacy. 

## 2021-12-17 NOTE — Telephone Encounter (Signed)
Returned call to pt notified NOT to "double up" on her Entresto. Verbalized understanding. BP has been: today:130/74(2x Entresto 49-51) yesterday 190/91 Tuesday 190/90. She does not remember the BP's before Tuesday. Informed pt that Dr Martinique is at the hospital today, verbalized understanding. What should we do now?

## 2021-12-17 NOTE — Telephone Encounter (Signed)
Lets increase Entresto to 97/103 mg bid. Continue to monitor BP  Tashima Scarpulla Martinique MD, [facility]

## 2022-03-03 ENCOUNTER — Other Ambulatory Visit: Payer: Self-pay | Admitting: Family Medicine

## 2022-03-08 ENCOUNTER — Other Ambulatory Visit: Payer: Self-pay

## 2022-03-08 ENCOUNTER — Telehealth: Payer: Self-pay

## 2022-03-08 MED ORDER — CLOPIDOGREL BISULFATE 75 MG PO TABS: 75.0000 mg | ORAL_TABLET | Freq: Every day | ORAL | 0 refills | Status: DC

## 2022-03-08 NOTE — Telephone Encounter (Signed)
Got message that patient was upset for long wait when she called in for her Rx Refill. Called patient back and left message to call my direct line if she wanted to discuss. ?

## 2022-03-08 NOTE — Telephone Encounter (Signed)
I spoke to the patient, she states she called to get a refill and was on hold for long periods of time. So, she called billing to get a refill on her Clopidogrel (Plavix) 75 MG. I sent refill to [facility]. ?

## 2022-03-08 NOTE — Telephone Encounter (Signed)
Spoke to patient advised Dr.Jordan is out of office this afternoon.Message was sent to him for advice about having a echo and lab work.I will call back with his advice. ? ?Stated she wanted to file a complaint about our phone system.She requested our Control and instrumentation engineer.Advised I will send her a message. ?

## 2022-03-10 ENCOUNTER — Telehealth: Payer: Self-pay

## 2022-03-10 DIAGNOSIS — I10 Essential (primary) hypertension: Secondary | ICD-10-CM

## 2022-03-10 DIAGNOSIS — E1169 Type 2 diabetes mellitus with other specified complication: Secondary | ICD-10-CM

## 2022-03-10 DIAGNOSIS — I5022 Chronic systolic (congestive) heart failure: Secondary | ICD-10-CM

## 2022-03-10 NOTE — Telephone Encounter (Signed)
Spoke to patient Dr.Jordan advised to have cmet,lipids,a1c.He also advised to have a echo.Scheduler will call back to schedule echo and follow up visit with Dr.Jordan.Advised she can have lab done any day this week at [facility] at [place] office.No appointment needed. ?

## 2022-03-17 ENCOUNTER — Encounter: Payer: Self-pay | Admitting: Neurology

## 2022-03-17 ENCOUNTER — Other Ambulatory Visit: Payer: Self-pay | Admitting: Cardiology

## 2022-03-22 DIAGNOSIS — Z0271 Encounter for disability determination: Secondary | ICD-10-CM

## 2022-03-23 ENCOUNTER — Ambulatory Visit (HOSPITAL_COMMUNITY): Payer: 59 | Attending: Cardiology

## 2022-03-23 DIAGNOSIS — I5022 Chronic systolic (congestive) heart failure: Secondary | ICD-10-CM | POA: Diagnosis present

## 2022-03-23 LAB — COMPREHENSIVE METABOLIC PANEL
ALT: 40 IU/L — ABNORMAL HIGH (ref 0–32)
AST: 33 IU/L (ref 0–40)
Albumin/Globulin Ratio: 1.9 (ref 1.2–2.2)
Albumin: 4.2 g/dL (ref 3.8–4.9)
Alkaline Phosphatase: 95 IU/L (ref 44–121)
BUN/Creatinine Ratio: 28 — ABNORMAL HIGH (ref 9–23)
BUN: 23 mg/dL (ref 6–24)
Bilirubin Total: 0.4 mg/dL (ref 0.0–1.2)
CO2: 20 mmol/L (ref 20–29)
Calcium: 9 mg/dL (ref 8.7–10.2)
Chloride: 108 mmol/L — ABNORMAL HIGH (ref 96–106)
Creatinine, Ser: 0.83 mg/dL (ref 0.57–1.00)
Globulin, Total: 2.2 g/dL (ref 1.5–4.5)
Glucose: 113 mg/dL — ABNORMAL HIGH (ref 70–99)
Potassium: 4 mmol/L (ref 3.5–5.2)
Sodium: 143 mmol/L (ref 134–144)
Total Protein: 6.4 g/dL (ref 6.0–8.5)
eGFR: 81 mL/min/{1.73_m2} (ref >59)

## 2022-03-23 LAB — LIPID PANEL
Chol/HDL Ratio: 2.2 ratio (ref 0.0–4.4)
Cholesterol, Total: 98 mg/dL — ABNORMAL LOW (ref 100–199)
HDL: 44 mg/dL (ref >39)
LDL Chol Calc (NIH): 38 mg/dL (ref 0–99)
Triglycerides: 78 mg/dL (ref 0–149)
VLDL Cholesterol Cal: 16 mg/dL (ref 5–40)

## 2022-03-23 LAB — ECHOCARDIOGRAM COMPLETE
Area-P 1/2: 3.61 cm2
S' Lateral: 3 cm

## 2022-03-23 LAB — HEMOGLOBIN A1C
Est. average glucose Bld gHb Est-mCnc: 169 mg/dL
Hgb A1c MFr Bld: 7.5 % — ABNORMAL HIGH (ref 4.8–5.6)

## 2022-03-23 NOTE — Telephone Encounter (Signed)
Spoke to patient she stated her B/P has been low ranging 101/54,100/50.She decreased Carvedilol to 25 mg 1/2 tablet twice a day 3 days ago.B/P better 135/79.Pulse has been normal.She wanted to make sure Dr.Jordan is ok with the decrease.Advised he is out of office this afternoon. I will speak to him tomorrow. ?

## 2022-03-23 NOTE — Telephone Encounter (Signed)
Patient calling back. She says she forgot to mention her BP has been very erratic. She says it was 105/54 the other day, which is very low for her. She says she is taking half a coreg tablet, but wants to make sure that is okay. She says she thinks it may be due to weight loss. ?

## 2022-03-24 ENCOUNTER — Telehealth: Payer: Self-pay

## 2022-03-24 NOTE — Telephone Encounter (Signed)
Spoke to patient I spoke to Dr.Jordan about Carvedilol.He advised ok to continue decreased dose 25 mg 1/2 tablet twice a day.Advised to continue to monitor B/P and bring readings to appointment scheduled 6/15 at 11:00 am. ?

## 2022-03-25 NOTE — Telephone Encounter (Signed)
Spoke to patient 4/26, Dr.Jordan he is ok with decrease in Carvedilol.Advised to monitor B/P and bring readings to 05/13/22 appointment. ?

## 2022-03-31 ENCOUNTER — Other Ambulatory Visit: Payer: Self-pay | Admitting: Family Medicine

## 2022-04-10 ENCOUNTER — Other Ambulatory Visit: Payer: Self-pay

## 2022-04-10 MED ORDER — ATORVASTATIN CALCIUM 80 MG PO TABS: 80.0000 mg | ORAL_TABLET | Freq: Every day | ORAL | 3 refills | Status: DC

## 2022-05-06 NOTE — Progress Notes (Signed)
NEUROLOGY CONSULTATION NOTE  Sheila Guerrero MRN: [id number] DOB: Feb 01, 1961  Referring provider: Antony Contras, MD Primary care provider: Antony Contras, MD  Reason for consult:  neuropathy and history of stroke  Assessment/Plan:   Thalamic pain syndrome History of right thalamic stroke, likely secondary to small vessel disease Type 2 diabetes mellitus Hypertension Hyperlipidemia   1  Refilled:  - Lyrica '150mg'$  (may take twice to three times daily)  - Tramadol '50mg'$  (may take 1/2-1 tablet twice to three times daily)  - Duloxetine '60mg'$  daily 2  Metanx 3  Secondary stroke prevention as managed by PCP/cardiology:  - Plavix '75mg'$  daliy  - Statin therapy.  LDL goal less than 70  - Glycemic control. Hgb a1c goal less than 7  - Normotensive blood pressure 4  Exercise 5  Consider [facility] for other options of pain control (such as acupuncture) 6  Follow up 6 months.   Subjective:  Sheila Guerrero is a 61 year old right-handed female with HTN, HLD, DM II, asthma, recurrent angioedema and anxiety with panic attacks who presents for neuropathy and history of stroke.  History supplemented by prior neurologist's and referring provider's notes.  She is accompanied by her sister-in-law.  MRI/MRA from 2022 personally reviewed.    Patient reportedly had a stroke in 2017-2018 that reportedly presented with confusion.  She later saw a neurologist.  MRI of brain personally reviewed on 03/06/2017 showed chronic left frontal subcortical lacunar infact and right thalamic chronic cerebral microhemorrhage vs dystrophic mineralization.  She was started on ASA. She has not worked since this stroke.    In February 2022, she develop left sided upper and lower weakness and numbness.  She was treated for probable pinched nerve.  That march, she went to the ED due to worsening low back pain and was found to have a right thalamic/capsular stroke.  MRA of head and neck showed severe right  MCA stenosis and severe distal right V4 stenosis.  Carotid ultrasound unremarkable.  TEE revealed vegetation on the mitral and tricuspid valves.  Evaluated by ID.  Although blood cultures were negative decision was made to treat with antibiotics.  She was discharged on ASA and Plavix DAPT followed by Plavix monotherapy.  In April 2022, she was readmitted to the hospital with confusion, speech disturbance and gait instability.  MRI of brain showed subacute left thalamic infarct.  EEG was normal.  Repeat TEE showed EF 45-50% with smaller vegetation on the mitral and tricuspid valves.  She followed up with cardiology who did not believe findings on TEE were vegetation but rather fine filamentous strands of uncertain significance.  Ultimately, her strokes were believed to be due to small vessel disease.  She was found by her previous neurologist to have a methylenetetrahydrofolate reductase deficiency and was started on Metanx  Following the stroke, she developed a thalamic pain syndrome. She takes Lyrica '150mg'$  TID, Cymbalta '60mg'$  daily (also for depression) and tramadol '50mg'$  BID PRN.  Regimen is effective but causes drowsiness.  Once she had hallucinations (seeing things that quickly moved across her vision).  Therefore, she has cut back on dosing of Lyrica and tramadol to twice daily, which has helped alertness but notes some increased pain.    Labs from 03/22/2022:  LDL 38, Hgb A1c 7.5  03/13/2021 MRI BRAIN W WO/MRA HEAD & NECK:  1. Acute to early subacute right and likely late subacute left thalamic infarcts. No mass effect or acute hemorrhage. 2. Severe stenosis of the distal right  MCA M1 segment. 3. Severe stenosis of the distal right V4 segment. 4. No stenosis of the carotid or vertebral arteries in the neck.  02/09/2021 MRI BRAIN:  1. Patchy post-contrast enhancement about the recently identified right thalamocapsular and left occipital infarcts, in keeping with early subacute ischemic infarcts. No  associated mass effect.  2. Underlying age-related cerebral atrophy with moderately advanced chronic microvascular ischemic disease. No other new intracranial abnormality on this limited postcontrast only exam. 02/07/2021 MRI BRAIN:  1. Small acute to early subacute infarct in the right thalamus/posterior limb of internal capsule. 2. Small subacute left occipital lobe infarct. 3. Moderately advanced chronic small vessel ischemic disease, progressed from 2018. 4. Numerous chronic microhemorrhages consistent with chronic hypertension. 02/07/2021 MRA HEAD:  1. Intracranial atherosclerosis including severe right M1 and moderate right V4 stenoses. 2. No large vessel occlusion. 03/02/2017 MRI BRAIN:  1. Round and ovoid periventricular and subcortical foci of T2 hyperintensites. Left frontal subcortical cystic gliosis vs chronic lacunar infarction. Left pontine T2 hyperintensity. Considerations include chronic small vessel ischemic disease, autoimmune, inflammatory or post-infectious etiologies.    2. Right thalamic chronic cerebral microhemorrhage vs dystrophic mineralization.  3. No acute findings.   PAST MEDICAL HISTORY: Past Medical History:  Diagnosis Date   Angioedema    Anxiety    Asthma    Depression    Diabetes mellitus, type II ([place])    Dyslipidemia    Family history of adverse reaction to anesthesia    mother severely confused after general anesthesia   History of CVA (cerebrovascular accident)    HTN (hypertension) 2013   Panic attacks    Pneumonia    Septic embolism ([place]) 02/10/2021   Situational stress    Subacute endocarditis 02/10/2021    PAST SURGICAL HISTORY: Past Surgical History:  Procedure Laterality Date   BUBBLE STUDY  02/10/2021   Procedure: BUBBLE STUDY;  Surgeon: Dixie Dials, MD;  Location: [facility];  Service: Cardiovascular;;   CHOLECYSTECTOMY  2010   TEE WITHOUT CARDIOVERSION N/A 02/10/2021   Procedure: TRANSESOPHAGEAL ECHOCARDIOGRAM (TEE);  Surgeon: Dixie Dials, MD;  Location: [facility] ENDOSCOPY;  Service: Cardiovascular;  Laterality: N/A;   TEE WITHOUT CARDIOVERSION N/A 03/27/2021   Procedure: TRANSESOPHAGEAL ECHOCARDIOGRAM (TEE);  Surgeon: Dixie Dials, MD;  Location: [facility] ENDOSCOPY;  Service: Cardiovascular;  Laterality: N/A;    MEDICATIONS: Current Outpatient Medications on File Prior to Visit  Medication Sig Dispense Refill   atorvastatin (LIPITOR) 80 MG tablet Take 1 tablet (80 mg total) by mouth daily. 90 tablet 3   carvedilol (COREG) 12.5 MG tablet Take 1 tablet (12.5 mg total) by mouth 2 (two) times daily. 180 tablet 3   clopidogrel (PLAVIX) 75 MG tablet Take 1 tablet (75 mg total) by mouth daily. 90 tablet 0   Cobalamin Combinations (VITAMIN B12-FOLIC ACID) 626-948 MCG TABS Take by mouth.     DULoxetine (CYMBALTA) 60 MG capsule Take 1 capsule (60 mg total) by mouth daily. 90 capsule 1   ENTRESTO 97-103 MG TAKE 1 TABLET BY MOUTH 2 (TWO) TIMES DAILY. PT WILL CALL FOR REFILL 180 tablet 0   EPINEPHrine 0.3 mg/0.3 mL IJ SOAJ injection Inject 0.3 mg into the muscle daily as needed for anaphylaxis.     Insulin Pen Needle (PEN NEEDLES) 29G X 12MM MISC 1 each by Does not apply route daily. 30 each 2   LANTUS SOLOSTAR 100 UNIT/ML Solostar Pen Inject 30 Units into the skin at bedtime.     meclizine (ANTIVERT) 25 MG tablet Take 25 mg  by mouth 3 (three) times daily as needed for dizziness.     metFORMIN (GLUCOPHAGE-XR) 500 MG 24 hr tablet Take 2 tablets by mouth daily.     pregabalin (LYRICA) 150 MG capsule Take 150 mg by mouth 3 (three) times daily.     traMADol (ULTRAM) 50 MG tablet Take 50 mg by mouth 2 (two) times daily as needed for moderate pain.     No current facility-administered medications on file prior to visit.    ALLERGIES: Allergies  Allergen Reactions   Sulfa Antibiotics Swelling   Aldactone [Spironolactone]     Inner ear infection    Dulaglutide     Other reaction(s): rash   Empagliflozin     Other reaction(s): recurrent yeast    Iodinated Contrast Media     Other reaction(s): angioedema with CT dye   Latex Rash    FAMILY HISTORY: Family History  Problem Relation Age of Onset   Hypertension Mother    Other Mother        blood disorder   Hypertension Father    Stroke Father    Diabetes Maternal Aunt     Objective:  Blood pressure 111/62, pulse 96, height '5\' 5"'$  (1.651 m), weight 180 lb (81.6 kg), last menstrual period 11/25/2011, SpO2 98 %. General: No acute distress.  Patient appears well-groomed.   Head:  Normocephalic/atraumatic Eyes:  fundi examined but not visualized Neck: supple, no paraspinal tenderness, full range of motion Heart: regular rate and rhythm Neurological Exam: Mental status: alert and oriented to person, place, and time, speech fluent and not dysarthric, language intact. Cranial nerves: CN I: not tested CN II: pupils equal, round and reactive to light, visual fields intact CN III, IV, VI:  full range of motion, no nystagmus, no ptosis CN V: facial sensation altered on left V1-V3 CN VII: upper and lower face symmetric CN VIII: hearing intact CN IX, X: gag intact, uvula midline CN XI: sternocleidomastoid and trapezius muscles intact CN XII: tongue midline Bulk & Tone: normal, no fasciculations. Motor:  muscle strength 5/5 throughout Sensation:  Pinprick sensation with hyperesthesias on left upper and lower extremities, vibratory sensation intact. Deep Tendon Reflexes:  2+ throughout,  toes downgoing.  Finger to nose testing:  Without dysmetria.   Heel to shin:  Without dysmetria.   Gait:  Broad-based, uses cane for balance.  Romberg with sway.    Thank you for allowing me to take part in the care of this patient.  Metta Clines, DO  CC: Antony Contras, MD

## 2022-05-07 ENCOUNTER — Encounter: Payer: Self-pay | Admitting: Neurology

## 2022-05-07 ENCOUNTER — Ambulatory Visit: Payer: 59 | Admitting: Neurology

## 2022-05-07 VITALS — BP 111/62 | HR 96 | Ht 65.0 in | Wt 180.0 lb

## 2022-05-07 DIAGNOSIS — E1169 Type 2 diabetes mellitus with other specified complication: Secondary | ICD-10-CM

## 2022-05-07 DIAGNOSIS — I10 Essential (primary) hypertension: Secondary | ICD-10-CM

## 2022-05-07 DIAGNOSIS — I639 Cerebral infarction, unspecified: Secondary | ICD-10-CM

## 2022-05-07 DIAGNOSIS — G890 Central pain syndrome: Secondary | ICD-10-CM

## 2022-05-07 DIAGNOSIS — E785 Hyperlipidemia, unspecified: Secondary | ICD-10-CM

## 2022-05-07 MED ORDER — DULOXETINE HCL 60 MG PO CPEP: 60.0000 mg | ORAL_CAPSULE | Freq: Every day | ORAL | 1 refills | Status: DC

## 2022-05-07 MED ORDER — TRAMADOL HCL 50 MG PO TABS: 50.0000 mg | ORAL_TABLET | Freq: Three times a day (TID) | ORAL | 5 refills | Status: DC | PRN

## 2022-05-07 MED ORDER — PREGABALIN 150 MG PO CAPS: 150.0000 mg | ORAL_CAPSULE | Freq: Three times a day (TID) | ORAL | 5 refills | Status: DC

## 2022-05-07 MED ORDER — L-METHYLFOLATE-B6-B12 3-35-2 MG PO TABS: 1.0000 | ORAL_TABLET | Freq: Every day | ORAL | 3 refills | Status: DC

## 2022-05-07 NOTE — Patient Instructions (Signed)
Refilled:  Lyrica, Cymbalta, Tramadol, the B complex Continue Plavix, atorvastatin, blood pressure and diabetes medication Contact [facility] to find out if they may have any treatment beneficial for you.  Contact me if they need a referral Follow up 6 months.

## 2022-05-10 NOTE — Progress Notes (Signed)
Cardiology Office Note   Date:  05/13/2022   ID:  GWENDLYON ZUMBRO, DOB 12/30/1960, MRN [id number]  PCP:  Antony Contras, MD  Cardiologist:   Benino Korinek [place], MD   Chief Complaint  Patient presents with   Follow-up    Post echo.       History of Present Illness: Sheila Guerrero is a 61 y.o. female who is seen at the request of Dr Tommy Medal for evaluation of possible bacterial endocarditis. She has a history of HTN, DM type 2, HLD, and prior CVA in 2017. She was admitted in March 2022 with left sided paresthesias and progressive left sided weakness. She was admitted with acute CVA. MRI brain demonstrated-small acute to early subacute infarct in the right thalamus/posterior limb of the internal capsule, small subacute left occipital lobe infarct, moderately advanced chronic small vessel ischemic disease progressed from 2018 and numerous chronic microhemorrhages consistent with chronic hypertension. Carotid dopplers were negative. Echo showed EF 40-45% (new since 2018). Subsequent TEE reportedly showed multiple small vegetations on both MV leaflets with moderate MR and a ? larger sessile vegetation on the tricuspid valve. Patient had no fever or other source of infection. Multiple blood cultures were negative. She was treated for culture negative endocarditis with IV Rocephin and daptomycin for 6 weeks. DC'd on Entresto and beta blocker. She was readmitted in April with confusion and difficulty speaking and walking. MRI showed subacute left thalamic infarct. CT head negative. EEG was normal. TEE was repeated on April 29 showing EF 45-50%. Reportedly smaller vegetation on medial MV leaflet and tricuspid valve. ? Small vegetation on noncoronary AV cusp. Mild MR and TR.  Since her stroke she is doing well. When last seen by Dr Jannifer Franklin was switched to Plavix monotherapy.   She had follow up Echo in April showing normal LV function. EF 60-65%.   On follow up today she is doing well. Denies any  dizziness, chest pain or palpitations. She still has balance problems. Coreg dose was reduced due to low BP. Off metformin now with A1c 6.5%.    Past Medical History:  Diagnosis Date   Angioedema    Anxiety    Asthma    Depression    Diabetes mellitus, type II ([place])    Dyslipidemia    Family history of adverse reaction to anesthesia    mother severely confused after general anesthesia   History of CVA (cerebrovascular accident)    HTN (hypertension) 2013   Panic attacks    Pneumonia    Septic embolism ([place]) 02/10/2021   Situational stress    Subacute endocarditis 02/10/2021    Past Surgical History:  Procedure Laterality Date   BUBBLE STUDY  02/10/2021   Procedure: BUBBLE STUDY;  Surgeon: Dixie Dials, MD;  Location: [place];  Service: Cardiovascular;;   CHOLECYSTECTOMY  2010   TEE WITHOUT CARDIOVERSION N/A 02/10/2021   Procedure: TRANSESOPHAGEAL ECHOCARDIOGRAM (TEE);  Surgeon: Dixie Dials, MD;  Location: [facility] ENDOSCOPY;  Service: Cardiovascular;  Laterality: N/A;   TEE WITHOUT CARDIOVERSION N/A 03/27/2021   Procedure: TRANSESOPHAGEAL ECHOCARDIOGRAM (TEE);  Surgeon: Dixie Dials, MD;  Location: [facility] ENDOSCOPY;  Service: Cardiovascular;  Laterality: N/A;     Current Outpatient Medications  Medication Sig Dispense Refill   atorvastatin (LIPITOR) 80 MG tablet Take 1 tablet (80 mg total) by mouth daily. 90 tablet 3   carvedilol (COREG) 12.5 MG tablet Take 1 tablet (12.5 mg total) by mouth 2 (two) times daily. 180 tablet 3   DULoxetine (CYMBALTA)  60 MG capsule Take 1 capsule (60 mg total) by mouth daily. 90 capsule 1   EPINEPHrine 0.3 mg/0.3 mL IJ SOAJ injection Inject 0.3 mg into the muscle daily as needed for anaphylaxis.     Insulin Pen Needle (PEN NEEDLES) 29G X 12MM MISC 1 each by Does not apply route daily. 30 each 2   l-methylfolate-B6-B12 (METANX) 3-35-2 MG TABS tablet Take 1 tablet by mouth daily. 90 tablet 3   LANTUS SOLOSTAR 100 UNIT/ML Solostar Pen Inject 30 Units  into the skin at bedtime.     meclizine (ANTIVERT) 25 MG tablet Take 25 mg by mouth 3 (three) times daily as needed for dizziness.     pregabalin (LYRICA) 150 MG capsule Take 1 capsule (150 mg total) by mouth 3 (three) times daily. 90 capsule 5   traMADol (ULTRAM) 50 MG tablet Take 1 tablet (50 mg total) by mouth 3 (three) times daily as needed for moderate pain. 90 tablet 5   clopidogrel (PLAVIX) 75 MG tablet Take 1 tablet (75 mg total) by mouth daily. 90 tablet 3   sacubitril-valsartan (ENTRESTO) 97-103 MG TAKE 1 TABLET BY MOUTH 2 (TWO) TIMES DAILY. PT WILL CALL FOR REFILL 180 tablet 3   No current facility-administered medications for this visit.    Allergies:   Sulfa antibiotics, Aldactone [spironolactone], Dulaglutide, Empagliflozin, Iodinated contrast media, and Latex    Social History:  The patient  reports that she has never smoked. She has never used smokeless tobacco. She reports current alcohol use. She reports that she does not use drugs.   Family History:  The patient's family history includes CVA in her maternal aunt; Diabetes in her maternal aunt; Hypertension in her father and mother; Other in her mother; Stroke in her father.    ROS:  Please see the history of present illness.   Otherwise, review of systems are positive for none.   All other systems are reviewed and negative.    PHYSICAL EXAM: VS:  BP (!) 106/58 (BP Location: Left Arm, Patient Position: Sitting, Cuff Size: Normal)   Pulse 90   Ht '5\' 5"'$  (1.651 m)   Wt 183 lb (83 kg)   LMP 11/25/2011   BMI 30.45 kg/m  , BMI Body mass index is 30.45 kg/m. GEN: Well nourished, well developed, in no acute distress  HEENT: normal  Neck: no JVD, carotid bruits, or masses Cardiac: RRR; no murmurs, rubs, or gallops,no edema  Respiratory:  clear to auscultation bilaterally, normal work of breathing GI: soft, nontender, nondistended, + BS MS: no deformity or atrophy  Skin: warm and dry, no rash Neuro:  Strength and  sensation are intact Psych: euthymic mood, full affect   EKG:  EKG is  ordered today. NSR rate 90. Low voltage. Nonspecific TWA. I have personally reviewed and interpreted this study.     Recent Labs: 03/22/2022: ALT 40; BUN 23; Creatinine, Ser 0.83; Potassium 4.0; Sodium 143    Lipid Panel    Component Value Date/Time   CHOL 98 (L) 03/22/2022 1038   TRIG 78 03/22/2022 1038   HDL 44 03/22/2022 1038   CHOLHDL 2.2 03/22/2022 1038   CHOLHDL 8.4 03/13/2021 0445   VLDL 77 (H) 03/13/2021 0445   LDLCALC 38 03/22/2022 1038    Dated 05/12/22: glucose 130, otherwise CMET normal. Cholesterol 112, triglycerides 114, HDL 37, LDL 55. A1c 6.5%.  Wt Readings from Last 3 Encounters:  05/13/22 183 lb (83 kg)  05/07/22 180 lb (81.6 kg)  08/26/21 183 lb 4.8  oz (83.1 kg)      Other studies Reviewed: Additional studies/ records that were reviewed today include:  Echo 02/08/21: IMPRESSIONS     1. Left ventricular ejection fraction, by estimation, is 40 to 45%. The  left ventricle has mildly decreased function. The left ventricle  demonstrates global hypokinesis. There is moderate concentric left  ventricular hypertrophy. Indeterminate diastolic  filling due to E-A fusion.   2. Right ventricular systolic function is normal. The right ventricular  size is normal. Tricuspid regurgitation signal is inadequate for assessing  PA pressure.   3. The mitral valve is grossly normal. Trivial mitral valve  regurgitation. No evidence of mitral stenosis.   4. The aortic valve is tricuspid. Aortic valve regurgitation is not  visualized. No aortic stenosis is present.   5. The inferior vena cava is normal in size with greater than 50%  respiratory variability, suggesting right atrial pressure of 3 mmHg.   Conclusion(s)/Recommendation(s): No intracardiac source of embolism    TEE 02/10/21: IMPRESSIONS     1. Left ventricular ejection fraction, by estimation, is 45 to 50%. The  left ventricle has  mildly decreased function. The left ventricle  demonstrates global hypokinesis. Left ventricular diastolic function could  not be evaluated.   2. Right ventricular systolic function is normal. The right ventricular  size is normal.   3. Left atrial size was moderately dilated. No left atrial/left atrial  appendage thrombus was detected.   4. Right atrial size was mildly dilated.   5. The pericardial effusion is posterior to the left ventricle. There is  no evidence of cardiac tamponade.   6. Mobile small MV vegetations seen on both leaflets. The mitral valve is  normal in structure. Moderate mitral valve regurgitation.   7. Sessile cauliflower shaped vegetation measuring 0.428 x 0.712 cm on  anterior leaflet mid portion of TV.   8. The aortic valve is tricuspid. Aortic valve regurgitation is not  visualized. No aortic stenosis is present.   9. There is mild (Grade II) atheroma plaque involving the descending  aorta and ascending aorta.  10. The inferior vena cava is normal in size with greater than 50%  respiratory variability, suggesting right atrial pressure of 3 mmHg.  11. Evidence of atrial level shunting detected by color flow Doppler.  Agitated saline contrast bubble study was negative, with no evidence of  any interatrial shunt. There is a small patent foramen ovale with  predominantly right to left shunting across  the atrial septum.    TEE 03/27/21: IMPRESSIONS     1. Left ventricular ejection fraction, by estimation, is 45 to 50%. The  left ventricle has mildly decreased function. The left ventricle  demonstrates global hypokinesis. Left ventricular diastolic function could  not be evaluated.   2. Right ventricular systolic function is normal. The right ventricular  size is normal.   3. Left atrial size was mildly dilated. No left atrial/left atrial  appendage thrombus was detected.   4. The mitral valve is grossly normal. Mild mitral valve regurgitation.   5. Smaller  than before sesile vegetation over medial leaflet.   6. Very small AV vegetation on non-coroanry cusp. The aortic valve is  tricuspid. There is mild calcification of the aortic valve. There is mild  thickening of the aortic valve. Aortic valve regurgitation is not  visualized.   7. There is mild (Grade II) atheroma plaque involving the ascending aorta  and descending aorta.   Conclusion(s)/Recommendation(s): Findings are concerning for  vegetation/infective endocarditis  as detailed above. Vegetations 25%  smaller than before.    Echo 03/23/22: IMPRESSIONS     1. Left ventricular ejection fraction, by estimation, is 60 to 65%. The  left ventricle has normal function. The left ventricle has no regional  wall motion abnormalities. There is moderate left ventricular hypertrophy.  Left ventricular diastolic  parameters were normal.   2. Right ventricular systolic function is normal. The right ventricular  size is normal. Tricuspid regurgitation signal is inadequate for assessing  PA pressure.   3. The mitral valve is normal in structure. Trivial mitral valve  regurgitation.   4. The aortic valve was not well visualized. Aortic valve regurgitation  is not visualized. No aortic stenosis is present.   5. The inferior vena cava is normal in size with greater than 50%  respiratory variability, suggesting right atrial pressure of 3 mmHg.   ASSESSMENT AND PLAN:  1.  Recurrent CVA. I reviewed history and imaging studies. I feel her strokes are due to small vessel disease in the cerebral vessels due to chronic HTN, DM, and HLD. I also reviewed her Echo and TEE images. I  do not feel that there is convincing evidence of vegetations. She has some fine filamentous strands noted on the MV of uncertain significance.  Given lack of any clinical evidence of infection and normal blood cultures I think the weight of evidence is that she never had endocarditis. There is no significant carotid disease or  other cardio-embolic source. Recommend she continue Plavix .  Needs aggressive risk factor modification with control of DM, HTN and HLD.   2. Chronic systolic CHF. EF is mildly reduced at 45-50%. I think this is also related to chronic HTN and DM. Repeat Echo in April showed normal EF 60-65%. She has class 1 symptoms. Will continue Entresto at 97/103 mg bid. Continue Coreg at 12.5 mg bid. She was intolerant of low dose aldactone due to dizziness. Given the fact that EF is now normal and she is asymptomatic I would not add SGLT-2 inhibitor at this time.   3. HTN. Controlled.  4. Hypercholesterolemia. Now on high dose lipitor.Goal LDL < 70 with recurrent CVA. Recent lipids are excellent with LDL 55. Continue same Rx.   5. DM per primary care. A1c good at 6.5%. still on insulin but now off metformin.    Signed, Lulia Schriner [place], MD  05/13/2022 11:24 AM    [facility] [address] [place], [name], [address] Phone [phone], Fax [phone]

## 2022-05-13 ENCOUNTER — Encounter: Payer: Self-pay | Admitting: Cardiology

## 2022-05-13 ENCOUNTER — Ambulatory Visit: Payer: 59 | Admitting: Cardiology

## 2022-05-13 VITALS — BP 106/58 | HR 90 | Ht 65.0 in | Wt 183.0 lb

## 2022-05-13 DIAGNOSIS — E1169 Type 2 diabetes mellitus with other specified complication: Secondary | ICD-10-CM

## 2022-05-13 DIAGNOSIS — E785 Hyperlipidemia, unspecified: Secondary | ICD-10-CM

## 2022-05-13 DIAGNOSIS — I5022 Chronic systolic (congestive) heart failure: Secondary | ICD-10-CM

## 2022-05-13 DIAGNOSIS — I6350 Cerebral infarction due to unspecified occlusion or stenosis of unspecified cerebral artery: Secondary | ICD-10-CM

## 2022-05-13 MED ORDER — ENTRESTO 97-103 MG PO TABS: ORAL_TABLET | ORAL | 3 refills | Status: DC

## 2022-05-13 MED ORDER — CLOPIDOGREL BISULFATE 75 MG PO TABS
75.0000 mg | ORAL_TABLET | Freq: Every day | ORAL | 3 refills | Status: DC
Start: 2022-05-13 — End: 2023-06-29

## 2022-05-13 NOTE — Patient Instructions (Addendum)
Medication Instructions:  No changes   *If you need a refill on your cardiac medications before your next appointment, please call your pharmacy*   Lab Work: not needed     Testing/Procedures: Not needed   Follow-Up: At [facility], you and your health needs are our priority.  As part of our continuing mission to provide you with exceptional heart care, we have created designated Provider Care Teams.  These Care Teams include your primary Cardiologist (physician) and Advanced Practice Providers (APPs -  Physician Assistants and Nurse Practitioners) who all work together to provide you with the care you need, when you need it.     Your next appointment:   6 month(s)  The format for your next appointment:   In Person  Provider:   Peter [place], MD

## 2022-05-20 ENCOUNTER — Telehealth: Payer: Self-pay | Admitting: Neurology

## 2022-05-20 NOTE — Telephone Encounter (Signed)
Patient needs a call back to discuss her medicine. She went to the pharmacy to get her medication and it was wrong. The only meds she wants jaffe to prescribe is Cymbalta and her vitamin b/genetic condition meds ( she don't know the name ) she does not want jaffe to prescribe the lyrica or tramadol, she will get that from her PCP. The dosage that jaffe sent was wrong. Stated she was told we would call the pharmacy and get the correct strength

## 2022-05-20 NOTE — Telephone Encounter (Signed)
Per patient all pain medication will go through her PCP. As for Cymbalta and Vitamin B please have Dr.Jaffe fill for her. She do not need it right tho.

## 2022-06-10 ENCOUNTER — Ambulatory Visit: Payer: 59 | Admitting: Podiatry

## 2022-08-03 ENCOUNTER — Ambulatory Visit: Payer: 59 | Admitting: Neurology

## 2022-09-21 IMAGING — MR MR HIP*L* W/O CM
4 of 6 series · 19 of 40 positions shown · non-contrast
Comparison: 09/08/2020

CLINICAL DATA: Left hip pain. Heart valve vegetation. Concern for
joint infection

EXAM:
MR OF THE LEFT HIP WITHOUT CONTRAST
TECHNIQUE: Multiplanar, multisequence MR imaging was performed. No intravenous
contrast was administered.

[Series 3: T2 fat-sat · coronal · 4.0mm · 0.70mm/px · 3 of 28 slices shown (1 of 2)]
[im 5/28]
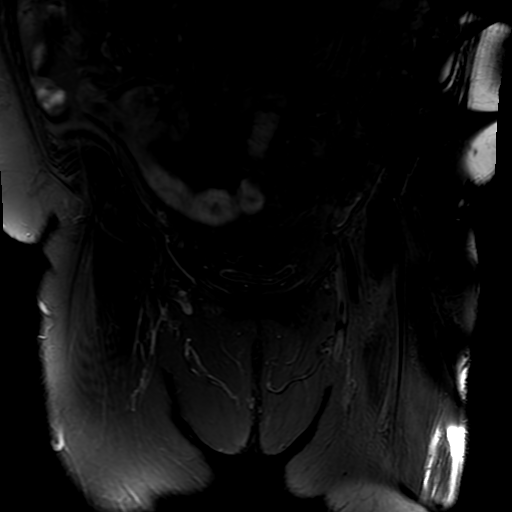
[im 14/28]
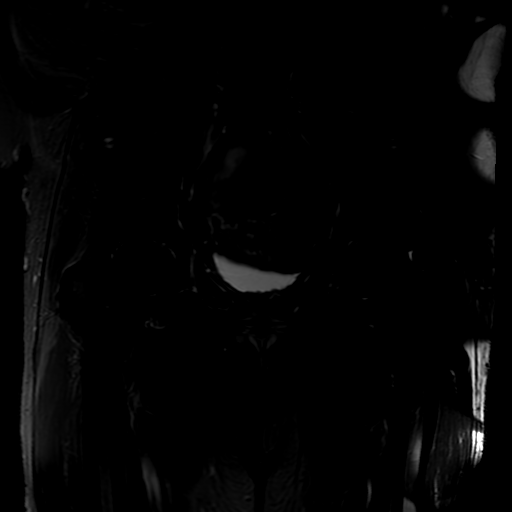
[im 23/28]
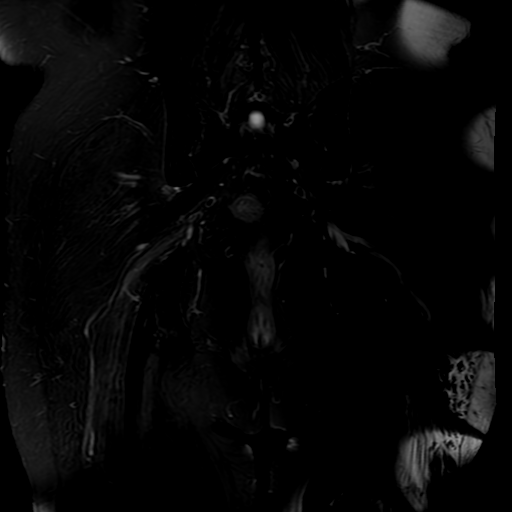

[Series 5: T2 fat-sat · axial · 4.0mm · 0.70mm/px · z∈[-66,+39]mm · 3 of 27 slices shown (2 of 2)]
[im 6/27]
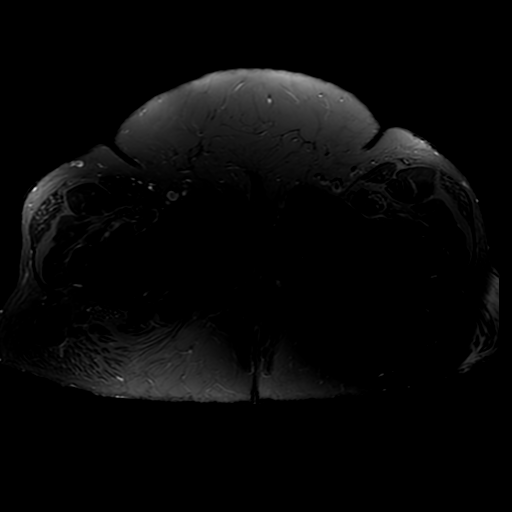
[im 16/27]
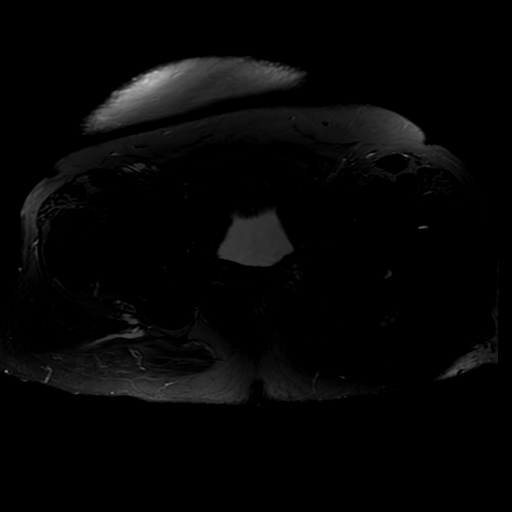
[im 27/27]
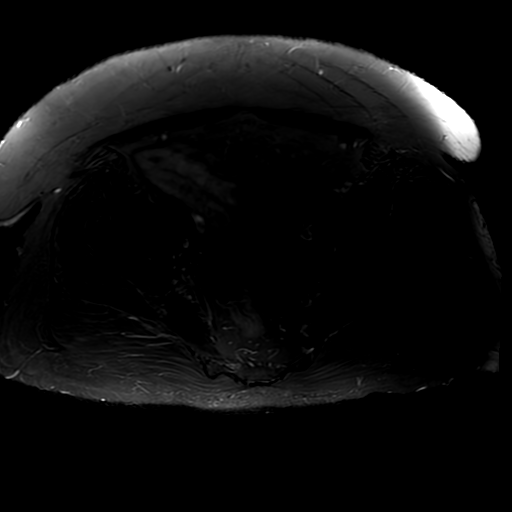

[Series 7: PD fat-sat · sagittal · 4.0mm · 0.35mm/px · 7 of 33 slices shown (1 of 2)]
[im 1/33]
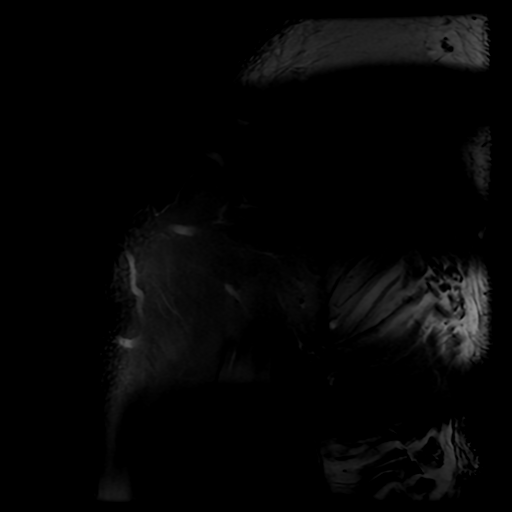
[im 6/33]
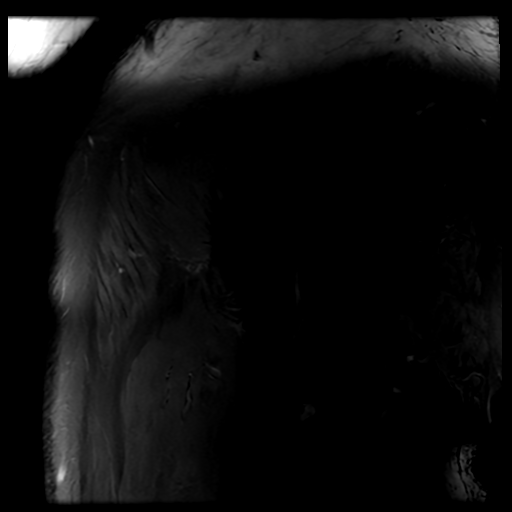
[im 11/33]
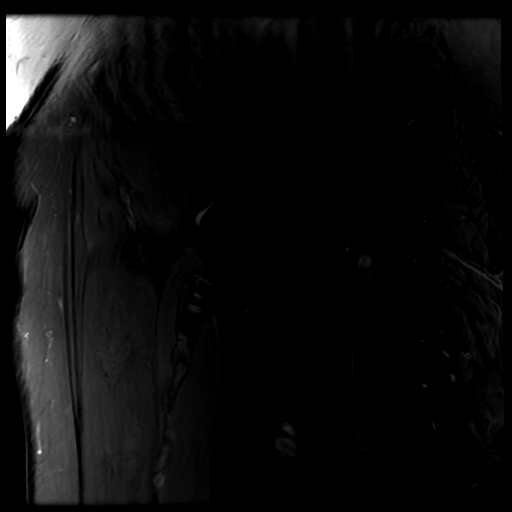
[im 17/33]
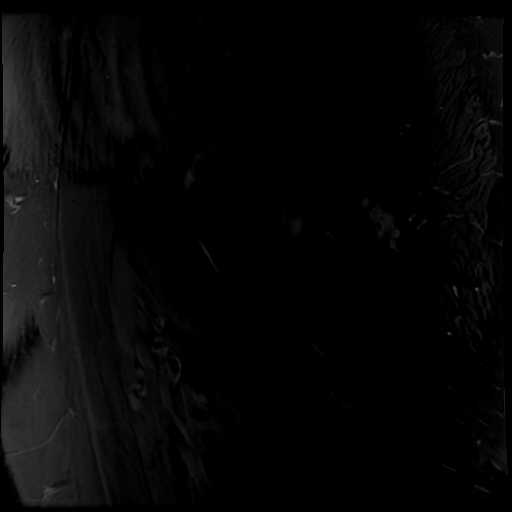
[im 22/33]
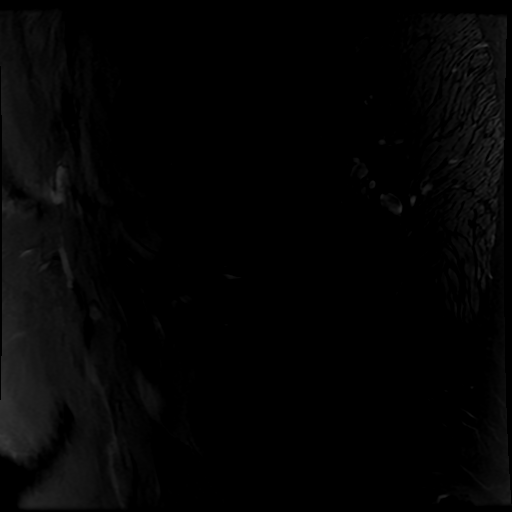
[im 27/33]
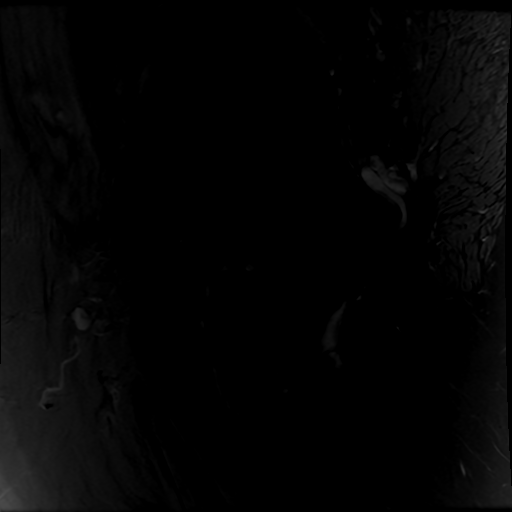
[im 33/33]
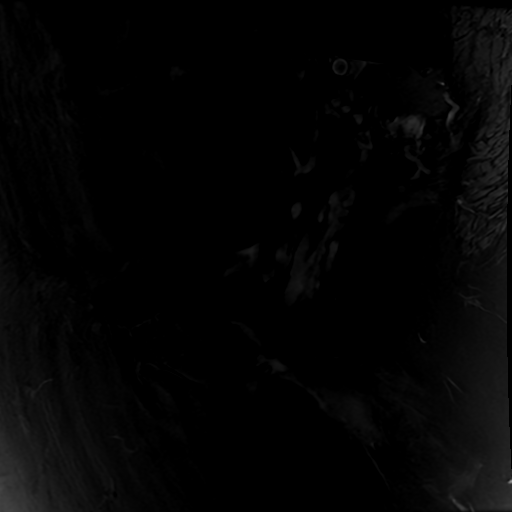

[Series 8: PD fat-sat · coronal · 4.0mm · 0.35mm/px · 6 of 33 slices shown (2 of 2)]
[im 1/33]
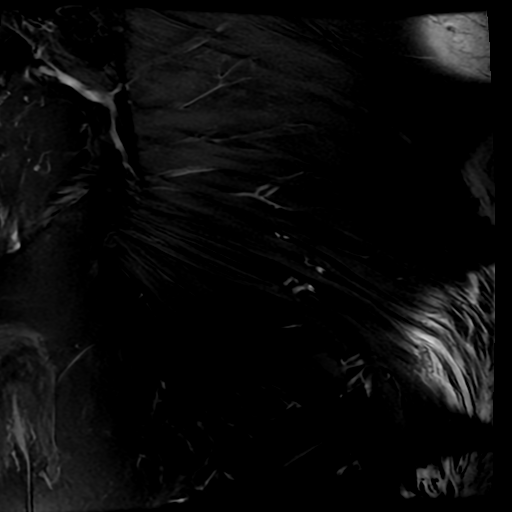
[im 6/33]
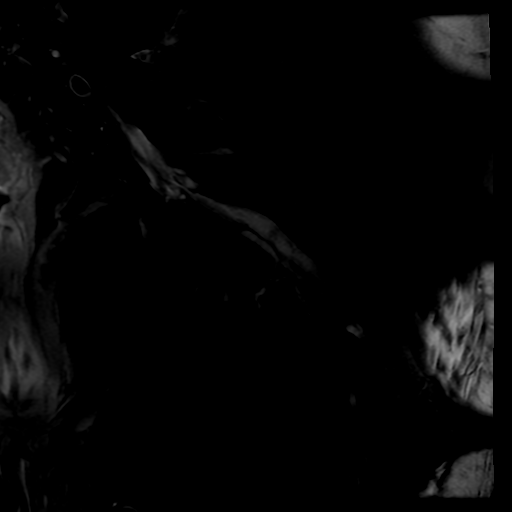
[im 11/33]
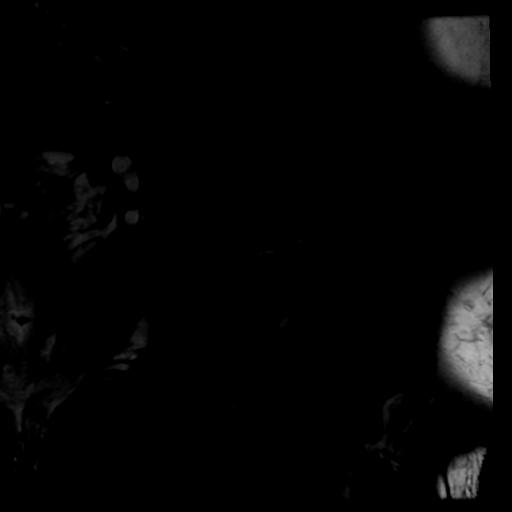
[im 17/33]
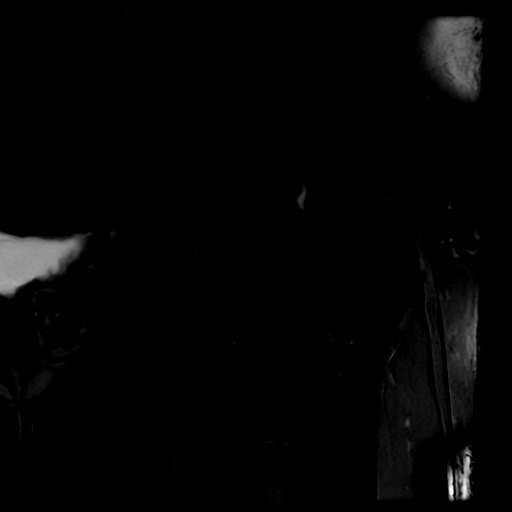
[im 22/33]
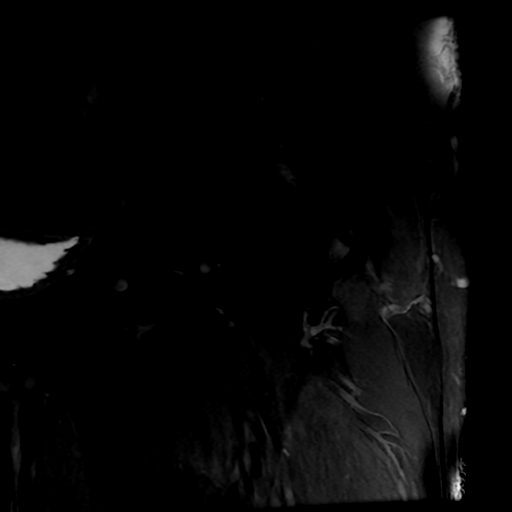
[im 27/33]
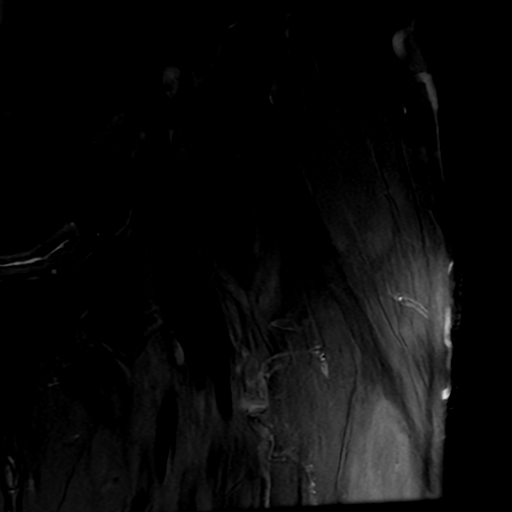

[19 of 40 positions shown; findings below may reference images not displayed]

FINDINGS: Bones: No acute fracture. No dislocation. No femoral head avascular
necrosis. No bony erosion or periostitis. Bony pelvis intact. SI
joints and pubic symphysis within normal limits. Mild right hip
osteoarthritis with associated labral degeneration. No right hip
joint effusion. Degenerative disc disease of L5-S1.

Articular cartilage and labrum

Articular cartilage: Mild chondral thinning and surface irregularity
without focal defect.

Labrum: Superior labral degeneration and tearing. 5 mm paralabral
cyst along the anteroinferior margin of the labrum.

Joint or bursal effusion

Joint effusion:  None.

Bursae: No bursal fluid collections.

Muscles and tendons

Muscles and tendons: Mild tendinosis of the bilateral hamstring
tendon origins. The gluteal, iliopsoas, rectus femoris, and adductor
tendons appear intact without tear or significant tendinosis. Normal
muscle bulk and signal intensity without edema, atrophy, or fatty
infiltration.

Other findings

Miscellaneous: No soft tissue edema or fluid collection. No inguinal
lymphadenopathy. No acute findings within the pelvis.
IMPRESSION: 1. No acute findings. Specifically, no evidence of septic arthritis
or osteomyelitis.
2. Mild bilateral hip osteoarthritis with superior labral
degeneration.
3. Mild bilateral hamstring tendinosis.

## 2022-11-04 NOTE — Progress Notes (Signed)
Cardiology Office Note   Date:  11/12/2022   ID:  Sheila Guerrero, DOB 26-Apr-1961, MRN [id number]  PCP:  Antony Contras, MD  Cardiologist:   Edgar Corrigan [place], MD   Chief Complaint  Patient presents with   Congestive Heart Failure       History of Present Illness: Sheila Guerrero is a 61 y.o. female who is seen for follow up.  She has a history of HTN, DM type 2, HLD, and prior CVA in 2017. She was admitted in March 2022 with left sided paresthesias and progressive left sided weakness. She was admitted with acute CVA. MRI brain demonstrated-small acute to early subacute infarct in the right thalamus/posterior limb of the internal capsule, small subacute left occipital lobe infarct, moderately advanced chronic small vessel ischemic disease progressed from 2018 and numerous chronic microhemorrhages consistent with chronic hypertension. Carotid dopplers were negative. Echo showed EF 40-45% (new since 2018). Subsequent TEE reportedly showed multiple small vegetations on both MV leaflets with moderate MR and a ? larger sessile vegetation on the tricuspid valve. Patient had no fever or other source of infection. Multiple blood cultures were negative. She was treated for culture negative endocarditis with IV Rocephin and daptomycin for 6 weeks. DC'd on Entresto and beta blocker. She was readmitted in April with confusion and difficulty speaking and walking. MRI showed subacute left thalamic infarct. CT head negative. EEG was normal. TEE was repeated on April 29 showing EF 45-50%. Reportedly smaller vegetation on medial MV leaflet and tricuspid valve. ? Small vegetation on noncoronary AV cusp. Mild MR and TR. I also reviewed her Echo and TEE images. I  did no feel that there was convincing evidence of vegetations. She has some fine filamentous strands noted on the MV of uncertain significance.  Given lack of any clinical evidence of infection and normal blood cultures I think the weight of evidence  is that she never had endocarditis.  Since her stroke she is doing well. When last seen by Dr Sheila Guerrero was switched to Plavix monotherapy.   She had follow up Echo in April showing normal LV function. EF 60-65%.   On follow up today she is doing well. Denies any dizziness, chest pain or palpitations.she has a frozen left shoulder. Notes with cortisone shots her sugars went up. She does feel tired a lot. No new Neurologic symptoms.  Coreg dose was previously reduced due to low BP.    Past Medical History:  Diagnosis Date   Angioedema    Anxiety    Asthma    Depression    Diabetes mellitus, type II ([facility])    Dyslipidemia    Family history of adverse reaction to anesthesia    mother severely confused after general anesthesia   History of CVA (cerebrovascular accident)    HTN (hypertension) 2013   Panic attacks    Pneumonia    Septic embolism ([place]) 02/10/2021   Situational stress    Subacute endocarditis 02/10/2021    Past Surgical History:  Procedure Laterality Date   BUBBLE STUDY  02/10/2021   Procedure: BUBBLE STUDY;  Surgeon: Dixie Dials, MD;  Location: [place];  Service: Cardiovascular;;   CHOLECYSTECTOMY  2010   TEE WITHOUT CARDIOVERSION N/A 02/10/2021   Procedure: TRANSESOPHAGEAL ECHOCARDIOGRAM (TEE);  Surgeon: Dixie Dials, MD;  Location: [facility] ENDOSCOPY;  Service: Cardiovascular;  Laterality: N/A;   TEE WITHOUT CARDIOVERSION N/A 03/27/2021   Procedure: TRANSESOPHAGEAL ECHOCARDIOGRAM (TEE);  Surgeon: Dixie Dials, MD;  Location: [place];  Service: Cardiovascular;  Laterality: N/A;     Current Outpatient Medications  Medication Sig Dispense Refill   atorvastatin (LIPITOR) 80 MG tablet Take 1 tablet (80 mg total) by mouth daily. 90 tablet 3   carvedilol (COREG) 12.5 MG tablet Take 1 tablet (12.5 mg total) by mouth 2 (two) times daily. 180 tablet 3   clopidogrel (PLAVIX) 75 MG tablet Take 1 tablet (75 mg total) by mouth daily. 90 tablet 3   DULoxetine (CYMBALTA) 60 MG  capsule Take 1 capsule (60 mg total) by mouth daily. 90 capsule 1   EPINEPHrine 0.3 mg/0.3 mL IJ SOAJ injection Inject 0.3 mg into the muscle daily as needed for anaphylaxis.     Insulin Pen Needle (PEN NEEDLES) 29G X 12MM MISC 1 each by Does not apply route daily. 30 each 2   l-methylfolate-B6-B12 (METANX) 3-35-2 MG TABS tablet Take 1 tablet by mouth daily. 90 tablet 1   LANTUS SOLOSTAR 100 UNIT/ML Solostar Pen Inject 45 Units into the skin at bedtime.     meclizine (ANTIVERT) 25 MG tablet Take 25 mg by mouth 3 (three) times daily as needed for dizziness.     pregabalin (LYRICA) 150 MG capsule Take 1 capsule (150 mg total) by mouth 3 (three) times daily. (Patient taking differently: Take 50 mg by mouth 3 (three) times daily. 1-3 times a day) 90 capsule 5   sacubitril-valsartan (ENTRESTO) 97-103 MG TAKE 1 TABLET BY MOUTH 2 (TWO) TIMES DAILY. PT WILL CALL FOR REFILL 180 tablet 3   traMADol (ULTRAM) 50 MG tablet Take 1 tablet (50 mg total) by mouth 3 (three) times daily as needed for moderate pain. 90 tablet 5   No current facility-administered medications for this visit.    Allergies:   Sulfa antibiotics, Aldactone [spironolactone], Dulaglutide, Empagliflozin, Iodinated contrast media, and Latex    Social History:  The patient  reports that she has never smoked. She has never used smokeless tobacco. She reports current alcohol use. She reports that she does not use drugs.   Family History:  The patient's family history includes CVA in her maternal aunt; Diabetes in her maternal aunt; Hypertension in her father and mother; Other in her mother; Stroke in her father.    ROS:  Please see the history of present illness.   Otherwise, review of systems are positive for none.   All other systems are reviewed and negative.    PHYSICAL EXAM: VS:  BP 118/60   Pulse 99   Ht '5\' 5"'$  (1.651 m)   Wt 188 lb 3.2 oz (85.4 kg)   LMP 11/25/2011   SpO2 100%   BMI 31.32 kg/m  , BMI Body mass index is 31.32  kg/m. GEN: Well nourished, well developed, in no acute distress  HEENT: normal  Neck: no JVD, carotid bruits, or masses Cardiac: RRR; no murmurs, rubs, or gallops,no edema  Respiratory:  clear to auscultation bilaterally, normal work of breathing GI: soft, nontender, nondistended, + BS MS: no deformity or atrophy  Skin: warm and dry, no rash Neuro:  Strength and sensation are intact Psych: euthymic mood, full affect   EKG:  EKG is  not ordered today.      Recent Labs: 03/22/2022: ALT 40; BUN 23; Creatinine, Ser 0.83; Potassium 4.0; Sodium 143    Lipid Panel    Component Value Date/Time   CHOL 98 (L) 03/22/2022 1038   TRIG 78 03/22/2022 1038   HDL 44 03/22/2022 1038   CHOLHDL 2.2 03/22/2022 1038   CHOLHDL 8.4 03/13/2021 0445  VLDL 77 (H) 03/13/2021 0445   LDLCALC 38 03/22/2022 1038    Dated 05/12/22: glucose 130, otherwise CMET normal. Cholesterol 112, triglycerides 114, HDL 37, LDL 55. A1c 6.5%.  Wt Readings from Last 3 Encounters:  11/12/22 188 lb 3.2 oz (85.4 kg)  11/09/22 188 lb 12.8 oz (85.6 kg)  05/13/22 183 lb (83 kg)      Other studies Reviewed: Additional studies/ records that were reviewed today include:  Echo 02/08/21: IMPRESSIONS     1. Left ventricular ejection fraction, by estimation, is 40 to 45%. The  left ventricle has mildly decreased function. The left ventricle  demonstrates global hypokinesis. There is moderate concentric left  ventricular hypertrophy. Indeterminate diastolic  filling due to E-A fusion.   2. Right ventricular systolic function is normal. The right ventricular  size is normal. Tricuspid regurgitation signal is inadequate for assessing  PA pressure.   3. The mitral valve is grossly normal. Trivial mitral valve  regurgitation. No evidence of mitral stenosis.   4. The aortic valve is tricuspid. Aortic valve regurgitation is not  visualized. No aortic stenosis is present.   5. The inferior vena cava is normal in size with  greater than 50%  respiratory variability, suggesting right atrial pressure of 3 mmHg.   Conclusion(s)/Recommendation(s): No intracardiac source of embolism    TEE 02/10/21: IMPRESSIONS     1. Left ventricular ejection fraction, by estimation, is 45 to 50%. The  left ventricle has mildly decreased function. The left ventricle  demonstrates global hypokinesis. Left ventricular diastolic function could  not be evaluated.   2. Right ventricular systolic function is normal. The right ventricular  size is normal.   3. Left atrial size was moderately dilated. No left atrial/left atrial  appendage thrombus was detected.   4. Right atrial size was mildly dilated.   5. The pericardial effusion is posterior to the left ventricle. There is  no evidence of cardiac tamponade.   6. Mobile small MV vegetations seen on both leaflets. The mitral valve is  normal in structure. Moderate mitral valve regurgitation.   7. Sessile cauliflower shaped vegetation measuring 0.428 x 0.712 cm on  anterior leaflet mid portion of TV.   8. The aortic valve is tricuspid. Aortic valve regurgitation is not  visualized. No aortic stenosis is present.   9. There is mild (Grade II) atheroma plaque involving the descending  aorta and ascending aorta.  10. The inferior vena cava is normal in size with greater than 50%  respiratory variability, suggesting right atrial pressure of 3 mmHg.  11. Evidence of atrial level shunting detected by color flow Doppler.  Agitated saline contrast bubble study was negative, with no evidence of  any interatrial shunt. There is a small patent foramen ovale with  predominantly right to left shunting across  the atrial septum.    TEE 03/27/21: IMPRESSIONS     1. Left ventricular ejection fraction, by estimation, is 45 to 50%. The  left ventricle has mildly decreased function. The left ventricle  demonstrates global hypokinesis. Left ventricular diastolic function could  not be  evaluated.   2. Right ventricular systolic function is normal. The right ventricular  size is normal.   3. Left atrial size was mildly dilated. No left atrial/left atrial  appendage thrombus was detected.   4. The mitral valve is grossly normal. Mild mitral valve regurgitation.   5. Smaller than before sesile vegetation over medial leaflet.   6. Very small AV vegetation on non-coroanry cusp. The  aortic valve is  tricuspid. There is mild calcification of the aortic valve. There is mild  thickening of the aortic valve. Aortic valve regurgitation is not  visualized.   7. There is mild (Grade II) atheroma plaque involving the ascending aorta  and descending aorta.   Conclusion(s)/Recommendation(s): Findings are concerning for  vegetation/infective endocarditis as detailed above. Vegetations 25%  smaller than before.    Echo 03/23/22: IMPRESSIONS     1. Left ventricular ejection fraction, by estimation, is 60 to 65%. The  left ventricle has normal function. The left ventricle has no regional  wall motion abnormalities. There is moderate left ventricular hypertrophy.  Left ventricular diastolic  parameters were normal.   2. Right ventricular systolic function is normal. The right ventricular  size is normal. Tricuspid regurgitation signal is inadequate for assessing  PA pressure.   3. The mitral valve is normal in structure. Trivial mitral valve  regurgitation.   4. The aortic valve was not well visualized. Aortic valve regurgitation  is not visualized. No aortic stenosis is present.   5. The inferior vena cava is normal in size with greater than 50%  respiratory variability, suggesting right atrial pressure of 3 mmHg.   ASSESSMENT AND PLAN:  1.  Recurrent CVA. I reviewed history and imaging studies. I feel her strokes are due to small vessel disease in the cerebral vessels due to chronic HTN, DM, and HLD. I also reviewed her Echo and TEE images. I  do not feel that there is  convincing evidence of vegetations. She has some fine filamentous strands noted on the MV of uncertain significance.  Given lack of any clinical evidence of infection and normal blood cultures I think the weight of evidence is that she never had endocarditis. Recommend she continue Plavix . Continue  risk factor modification with control of DM, HTN and HLD.   2. Chronic systolic CHF. EF is mildly reduced at 45-50%. I think this is also related to chronic HTN and DM. Repeat Echo in April showed normal EF 60-65%. She has class 1 symptoms. Will continue Entresto at 97/103 mg bid. Continue Coreg at 12.5 mg bid. She was intolerant of low dose aldactone due to dizziness. Given the fact that EF is now normal and she is asymptomatic I would not add SGLT-2 inhibitor at this time. If she were to need additional diabetes control this would be a good option.  3. HTN. Controlled.  4. Hypercholesterolemia. Now on high dose lipitor.Goal LDL < 70 with recurrent CVA. Recent lipids are excellent with LDL 55. Continue same Rx.   5. DM per primary care. A1c good at 6.5%. still on insulin     Signed, Milayah Krell Martin[place], MD  11/12/2022 8:55 AM    [facility] [address], [place], [name], [address] Phone [phone], Fax [phone]

## 2022-11-08 NOTE — Progress Notes (Signed)
NEUROLOGY FOLLOW UP OFFICE NOTE  Sheila Guerrero [id number]  Assessment/Plan:   Thalamic pain syndrome History of right thalamic stroke, likely secondary to small vessel disease Type 2 diabetes mellitus Hypertension Hyperlipidemia     For thalamic pain:             - Cymbalta 60mg  daily (also for depression) - Lyrica 50mg  QD-TID (as prescribed by her PCP)             - Tramadol 50mg  1/2-1 tablet one to two times (as prescribed by her PCP) 2  Metanx (refilled) 3  Secondary stroke prevention as managed by PCP/cardiology:             - Plavix 75mg  daliy             - Statin therapy.  LDL goal less than 70             - Glycemic control. Hgb a1c goal less than 7             - Normotensive blood pressure 4  Exercise 5  Continue [facility] for pain management.  Will send an order for physical therapy to address gait instability as well 6  Follow up 6 months.     Subjective:  Sheila Guerrero is a 61 year old right-handed female with HTN, HLD, DM II, asthma, recurrent angioedema and anxiety with panic attacks who follows up for thalamic pain syndrome.  UPDATE: Lyrica 50mg  QD-TID, Cymbalta 60mg  daily (also for depression) and tramadol 50mg  QD-BID PRN.   [facility] has been very effective treating her pain.  She is seeing ortho later to address a frozen shoulder on her stroke side.  She sometimes feels more off-balance.  Due to subtle sensory deficits in the left foot, she sometimes has trouble coordinating and feeling the ground when she walks.    HISTORY:  Patient reportedly had a stroke in 2017-2018 that reportedly presented with confusion.  She later saw a neurologist.  MRI of brain personally reviewed on 03/06/2017 showed chronic left frontal subcortical lacunar infact and right thalamic chronic cerebral microhemorrhage vs dystrophic mineralization.  She was started on ASA. She has not worked since this stroke.     In February 2022, she develop  left sided upper and lower weakness and numbness.  She was treated for probable pinched nerve.  That march, she went to the ED due to worsening low back pain and was found to have a right thalamic/capsular stroke.  MRA of head and neck showed severe right MCA stenosis and severe distal right V4 stenosis.  Carotid ultrasound unremarkable.  TEE revealed vegetation on the mitral and tricuspid valves.  Evaluated by ID.  Although blood cultures were negative decision was made to treat with antibiotics.  She was discharged on ASA and Plavix DAPT followed by Plavix monotherapy.  In April 2022, she was readmitted to the hospital with confusion, speech disturbance and gait instability.  MRI of brain showed subacute left thalamic infarct.  EEG was normal.  Repeat TEE showed EF 45-50% with smaller vegetation on the mitral and tricuspid valves.  She followed up with cardiology who did not believe findings on TEE were vegetation but rather fine filamentous strands of uncertain significance.  Ultimately, her strokes were believed to be due to small vessel disease.  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 She takes Lyrica 150mg  TID, Cymbalta 60mg  daily (also for depression) and tramadol 50mg  BID PRN.  Regimen is effective but causes drowsiness.  Once she had hallucinations (seeing things that quickly moved across her vision).  Therefore, she has cut back on dosing of Lyrica and tramadol to twice daily, which has helped alertness but notes some increased pain.      03/13/2021 MRI BRAIN W WO/MRA HEAD & NECK:  1. Acute to early subacute right and likely late subacute left thalamic infarcts. No mass effect or acute hemorrhage. 2. Severe stenosis of the distal right MCA M1 segment. 3. Severe stenosis of the distal right V4 segment. 4. No stenosis of the carotid or vertebral  arteries in the neck.  02/09/2021 MRI BRAIN:  1. Patchy post-contrast enhancement about the recently identified right thalamocapsular and left occipital infarcts, in keeping with early subacute ischemic infarcts. No associated mass effect.  2. Underlying age-related cerebral atrophy with moderately advanced chronic microvascular ischemic disease. No other new intracranial abnormality on this limited postcontrast only exam. 02/07/2021 MRI BRAIN:  1. Small acute to early subacute infarct in the right thalamus/posterior limb of internal capsule. 2. Small subacute left occipital lobe infarct. 3. Moderately advanced chronic small vessel ischemic disease, progressed from 2018. 4. Numerous chronic microhemorrhages consistent with chronic hypertension. 02/07/2021 MRA HEAD:  1. Intracranial atherosclerosis including severe right M1 and moderate right V4 stenoses. 2. No large vessel occlusion. 03/02/2017 MRI BRAIN:  1. Round and ovoid periventricular and subcortical foci of T2 hyperintensites. Left frontal subcortical cystic gliosis vs chronic lacunar infarction. Left pontine T2 hyperintensity. Considerations include chronic small vessel ischemic disease, autoimmune, inflammatory or post-infectious etiologies.    2. Right thalamic chronic cerebral microhemorrhage vs dystrophic mineralization.  3. No acute findings.   PAST MEDICAL HISTORY: Past Medical History:  Diagnosis Date   Angioedema    Anxiety    Asthma    Depression    Diabetes mellitus, type II ([facility])    Dyslipidemia    Family history of adverse reaction to anesthesia    mother severely confused after general anesthesia   History of CVA (cerebrovascular accident)    HTN (hypertension) 2013   Panic attacks    Pneumonia    Septic embolism ([facility]) 02/10/2021   Situational stress    Subacute endocarditis 02/10/2021    MEDICATIONS: Current Outpatient Medications on File Prior to Visit  Medication Sig Dispense Refill   atorvastatin (LIPITOR) 80 MG  tablet Take 1 tablet (80 mg total) by mouth daily. 90 tablet 3   carvedilol (COREG) 12.5 MG tablet Take 1 tablet (12.5 mg total) by mouth 2 (two) times daily. 180 tablet 3   clopidogrel (PLAVIX) 75 MG tablet Take 1 tablet (75 mg total) by mouth daily. 90 tablet 3   DULoxetine (CYMBALTA) 60 MG capsule Take 1 capsule (60 mg total) by mouth daily. 90 capsule 1   EPINEPHrine 0.3 mg/0.3 mL IJ SOAJ injection Inject 0.3 mg into the muscle daily as needed for anaphylaxis.     Insulin Pen Needle (PEN NEEDLES) 29G X MISC 1 each by Does not apply route daily. 30 each 2   l-methylfolate-B6-B12 (METANX) 3-35-2 MG TABS tablet Take 1 tablet by mouth daily. 90 tablet 3   LANTUS SOLOSTAR 100 UNIT/ML Solostar Pen Inject 30 Units into the skin at bedtime.     meclizine (ANTIVERT) 25 MG tablet Take 25 mg by mouth 3 (three) times daily as needed for dizziness.     pregabalin (LYRICA)  150 MG capsule Take 1 capsule (150 mg total) by mouth 3 (three) times daily. 90 capsule 5   sacubitril-valsartan (ENTRESTO) 97-103 MG TAKE 1 TABLET BY MOUTH 2 (TWO) TIMES DAILY. PT WILL CALL FOR REFILL 180 tablet 3   traMADol (ULTRAM) 50 MG tablet Take 1 tablet (50 mg total) by mouth 3 (three) times daily as needed for moderate pain. 90 tablet 5   No current facility-administered medications on file prior to visit.    ALLERGIES: Allergies  Allergen Reactions   Sulfa Antibiotics Swelling   Aldactone [Spironolactone]     Inner ear infection    Dulaglutide     Other reaction(s): rash   Empagliflozin     Other reaction(s): recurrent yeast   Iodinated Contrast Media     Other reaction(s): angioedema with CT dye   Latex Rash    FAMILY HISTORY: Family History  Problem Relation Age of Onset   Hypertension Mother    Other Mother        blood disorder   Hypertension Father    Stroke Father    Diabetes Maternal Aunt    CVA Maternal Aunt       Objective:  Blood pressure (!) 158/86, pulse (!) 103, height 5\' 5"  (1.651 m),  weight 188 lb 12.8 oz (85.6 kg), last menstrual period 11/25/2011, SpO2 97 %. General: No acute distress.  Patient appears well-groomed.   Head:  Normocephalic/atraumatic Eyes:  Fundi examined but not visualized Neck: supple, no paraspinal tenderness, full range of motion Heart:  Regular rate and rhythm Neurological Exam: alert and oriented to person, place, and time.  Speech fluent and not dysarthric, language intact.  CN II-XII intact. Bulk and tone normal, muscle strength 5/5 throughout.  Sensation to pinprick and vibration grossly intact  Deep tendon reflexes 2+ throughout, toes downgoing.  Finger to nose testing intact.  Gait cautious but steady.  Able to turn.  Difficulty with tandem walk.  Romberg with mild sway.   Shon Millet, DO  CC: Tally Joe, MD

## 2022-11-09 ENCOUNTER — Encounter: Payer: Self-pay | Admitting: Neurology

## 2022-11-09 ENCOUNTER — Ambulatory Visit (INDEPENDENT_AMBULATORY_CARE_PROVIDER_SITE_OTHER): Payer: 59 | Admitting: Neurology

## 2022-11-09 VITALS — BP 158/86 | HR 103 | Ht 65.0 in | Wt 188.8 lb

## 2022-11-09 DIAGNOSIS — I10 Essential (primary) hypertension: Secondary | ICD-10-CM

## 2022-11-09 DIAGNOSIS — R269 Unspecified abnormalities of gait and mobility: Secondary | ICD-10-CM

## 2022-11-09 DIAGNOSIS — E1169 Type 2 diabetes mellitus with other specified complication: Secondary | ICD-10-CM

## 2022-11-09 DIAGNOSIS — G890 Central pain syndrome: Secondary | ICD-10-CM

## 2022-11-09 DIAGNOSIS — I639 Cerebral infarction, unspecified: Secondary | ICD-10-CM

## 2022-11-09 DIAGNOSIS — E785 Hyperlipidemia, unspecified: Secondary | ICD-10-CM

## 2022-11-09 MED ORDER — DULOXETINE HCL 60 MG PO CPEP: 60.0000 mg | ORAL_CAPSULE | Freq: Every day | ORAL | 1 refills | Status: DC

## 2022-11-09 MED ORDER — L-METHYLFOLATE-B6-B12 3-35-2 MG PO TABS: 1.0000 | ORAL_TABLET | Freq: Every day | ORAL | 1 refills | Status: DC

## 2022-11-09 NOTE — Patient Instructions (Addendum)
Refilled duloxetine and Metanx Will order physical therapy at [facility] to help with gait instability Follow up 6 months.

## 2022-11-12 ENCOUNTER — Ambulatory Visit: Payer: 59 | Attending: Cardiology | Admitting: Cardiology

## 2022-11-12 ENCOUNTER — Encounter: Payer: Self-pay | Admitting: Cardiology

## 2022-11-12 VITALS — BP 118/60 | HR 99 | Ht 65.0 in | Wt 188.2 lb

## 2022-11-12 DIAGNOSIS — I10 Essential (primary) hypertension: Secondary | ICD-10-CM

## 2022-11-12 DIAGNOSIS — I6350 Cerebral infarction due to unspecified occlusion or stenosis of unspecified cerebral artery: Secondary | ICD-10-CM

## 2022-11-12 DIAGNOSIS — I5022 Chronic systolic (congestive) heart failure: Secondary | ICD-10-CM | POA: Diagnosis not present

## 2022-12-17 ENCOUNTER — Other Ambulatory Visit: Payer: Self-pay | Admitting: Neurology

## 2023-02-10 ENCOUNTER — Other Ambulatory Visit: Payer: Self-pay | Admitting: Neurology

## 2023-02-11 ENCOUNTER — Telehealth: Payer: Self-pay | Admitting: Neurology

## 2023-02-11 NOTE — Telephone Encounter (Signed)
Pt called in stating she has been doing well with her therapy at [facility], but her therapist is moving to a different place. She would like to move with her therapist and would like a referral to [facility] in [facility]. Their phone number is [phone].

## 2023-02-22 ENCOUNTER — Telehealth: Payer: Self-pay | Admitting: Anesthesiology

## 2023-02-22 ENCOUNTER — Other Ambulatory Visit: Payer: Self-pay | Admitting: Neurology

## 2023-02-22 ENCOUNTER — Other Ambulatory Visit: Payer: Self-pay

## 2023-02-22 MED ORDER — TRAMADOL HCL 50 MG PO TABS: ORAL_TABLET | ORAL | 2 refills | Status: DC

## 2023-02-22 NOTE — Telephone Encounter (Signed)
This is a message about a referral and there is nothing needed from the pa team at this time Thank you

## 2023-02-22 NOTE — Telephone Encounter (Signed)
Pt called stating her pharmacy told her that they were not able to fill her Tramadol 50 mg Rx because Dr Tomi Likens was not in this practice any longer. States she needs a refill on her tramadol.

## 2023-02-23 NOTE — Telephone Encounter (Signed)
Called patient and informed her that her tramadol rx has been sent to her pharmacy. Patient thanked me for the call.

## 2023-04-01 ENCOUNTER — Telehealth: Payer: Self-pay | Admitting: Neurology

## 2023-04-01 ENCOUNTER — Telehealth: Payer: Self-pay | Admitting: Cardiology

## 2023-04-01 NOTE — Telephone Encounter (Signed)
Pt stated she'd like a callback regarding her feeling very tired and sleeping a lot lately. She stated "its beginning to feel like the recovery from the first 3 strokes I had a while back". Please advise

## 2023-04-01 NOTE — Telephone Encounter (Signed)
Called patient and gave Dr. Jaffe's recommendations  

## 2023-04-01 NOTE — Telephone Encounter (Signed)
New message    The patient calling C/o not feeling the same something seems off sleeping a lot lately.     H/o CVA x 3 within two weeks. Patient stated CVA were silence and unaware.   No ED / Urgent Care visit.   Will contact Cardiologist Dr. [place].

## 2023-04-01 NOTE — Telephone Encounter (Signed)
Returned call to pt she states that she is tired all the time and states that is a lot as though she could sleep all day about 14 hours and also states that when she sits down anywhere she feels like she will fall asleep and sometimes does. She denies any cardiac symptoms, chest pain, pressure, etc.... she is taking all her medications on her list. No new medications. Her BP/HR is running 140/80 HR 85-86. She does not know what is happening and this is frightening her it has similarities to when she had a stroke but her BP was higher. Please advise

## 2023-04-01 NOTE — Telephone Encounter (Signed)
Called pt back she denies any weakness on one side, no trouble speaking, no dizziness, no trouble with balance, etc..Marland Kitchen

## 2023-04-04 ENCOUNTER — Other Ambulatory Visit: Payer: Self-pay | Admitting: Cardiology

## 2023-04-04 NOTE — Telephone Encounter (Signed)
Spoke to patient 04/01/23.She stated she had similar symptoms when she had a stroke.Stated she called neurologist and was advised to call Dr.Jordan.Dr.Jordan's advice given.Stated she had a negative sleep study 2 years ago.Appointment scheduled with Edd Fabian NP 5/9 at 8:50 am.Advised if symptoms worsen go to ED.

## 2023-04-05 NOTE — Progress Notes (Unsigned)
Cardiology Clinic Note   Patient Name: Sheila Guerrero Date of Encounter: 04/07/2023  Primary Care Provider:  Tally Joe, MD Primary Cardiologist:  Peter Swaziland, MD  Patient Profile    Sheila Guerrero 61 year old female presents the clinic today for evaluation of her fatigue.  Past Medical History    Past Medical History:  Diagnosis Date   Angioedema    Anxiety    Asthma    Depression    Diabetes mellitus, type II (HCC)    Dyslipidemia    Family history of adverse reaction to anesthesia    mother severely confused after general anesthesia   History of CVA (cerebrovascular accident)    HTN (hypertension) 2013   Panic attacks    Pneumonia    Septic embolism (HCC) 02/10/2021   Situational stress    Subacute endocarditis 02/10/2021   Past Surgical History:  Procedure Laterality Date   BUBBLE STUDY  02/10/2021   Procedure: BUBBLE STUDY;  Surgeon: Orpah Cobb, MD;  Location: [facility] ENDOSCOPY;  Service: Cardiovascular;;   CHOLECYSTECTOMY  2010   TEE WITHOUT CARDIOVERSION N/A 02/10/2021   Procedure: TRANSESOPHAGEAL ECHOCARDIOGRAM (TEE);  Surgeon: Orpah Cobb, MD;  Location: [facility] ENDOSCOPY;  Service: Cardiovascular;  Laterality: N/A;   TEE WITHOUT CARDIOVERSION N/A 03/27/2021   Procedure: TRANSESOPHAGEAL ECHOCARDIOGRAM (TEE);  Surgeon: Orpah Cobb, MD;  Location: [facility] ENDOSCOPY;  Service: Cardiovascular;  Laterality: N/A;    Allergies  Allergies  Allergen Reactions   Sulfa Antibiotics Swelling   Aldactone [Spironolactone]     Inner ear infection    Dulaglutide     Other reaction(s): rash   Empagliflozin     Other reaction(s): recurrent yeast   Iodinated Contrast Media     Other reaction(s): angioedema with CT dye   Latex Rash    History of Present Illness    Sheila Guerrero has a PMH of CVA 2017, HTN, chronic sinusitis, type 2 diabetes, gait abnormality, bacteremia, and chronic systolic CHF.  She was admitted 3/22 with left-sided paresthesia and  progressive left-sided weakness.  She was diagnosed with acute CVA.  Her brain MRI showed small acute to early subacute infarct of her right thalamus/posterior limb of the internal capsule, small acute left subdural lobe infarct, and numerous chronic microhemorrhages consistent with chronic hypertension.  Her carotid Doppler was negative.  Her echocardiogram showed an EF of 40-45% which was new when compared to her 2018 echo.  She underwent subsequent TEE which showed multiple small vegetations on both mitral valve leaflets with moderate MR and larger vegetation on tricuspid valve.  She had no fever or other source of infection.  Her blood cultures were negative.  She was treated for culture-negative endocarditis with IV Rocephin and daptomycin.  She was discharged on Entresto and beta-blocker.  She was readmitted in April with altered mental status and difficulty speaking and walking.  MRI showed left thymic infarct CT head was negative.  Her EEG was normal.  Her TEE was repeated 4/29 and showed an EF of 45-50%.  Small vegetations on medial mitral valve leaflet and tricuspid valve were noted.  Small vegetation on noncoronary AV cusp.  Mild MR and TR.  Dr. Swaziland reviewed her echocardiogram and did not feel there were convincing evidence of vegetations.  She was noted to have some fine filamentous strands on the mitral valve of uncertain significance.  Due to lack of any evidence of infection and normal blood cultures it was felt that she never had endocarditis.  She was seen  in follow-up by Dr. Swaziland 11/12/2022.  During that time she was doing well.  She was recovering from her stroke.  Dr. Anne Hahn had switched her to Plavix monotherapy.  She had repeat echo in April which showed LV function of 60-65%.  She denied dizziness, chest pain and palpitations.  She was noted to have a frozen left shoulder.  She was receiving cortisone injections and her blood glucose had gone up.  She noted feeling tired a significant  amount of the time.  She had no new neurologic symptoms.  Her carvedilol dose had previously been decreased due to low blood pressure.  She presents to the clinic today for evaluation of her fatigue.  She states she has had worsening fatigue since she was seen last by cardiology.  We reviewed her stroke history.  She also has upcoming appointment with neurology.  Her EKG today shows sinus tachycardia 103 bpm.  She does not have a formal exercise routine and is limited in her mobility due to her prior CVAs.  I will increase her carvedilol to 18.7 5 in the morning and continue her 12.5 mg dose in the afternoon.  I will repeat her echocardiogram and plan follow-up in 1 to 2 months.  Will also request recent labs from her PCP.  Today she denies chest pain, shortness of breath, lower extremity edema, palpitations, melena, hematuria, hemoptysis, diaphoresis, weakness, presyncope, syncope, orthopnea, and PND.   Home Medications    Prior to Admission medications   Medication Sig Start Date End Date Taking? Authorizing Provider  atorvastatin (LIPITOR) 80 MG tablet TAKE 1 TABLET BY MOUTH EVERY DAY 04/04/23   Swaziland, Peter M, MD  carvedilol (COREG) 12.5 MG tablet Take 1 tablet (12.5 mg total) by mouth 2 (two) times daily. 07/22/21 11/12/22  Swaziland, Peter M, MD  clopidogrel (PLAVIX) 75 MG tablet Take 1 tablet (75 mg total) by mouth daily. 05/13/22   Swaziland, Peter M, MD  DULoxetine (CYMBALTA) 60 MG capsule Take 1 capsule (60 mg total) by mouth daily. 11/09/22   Drema Dallas, DO  EPINEPHrine 0.3 mg/0.3 mL IJ SOAJ injection Inject 0.3 mg into the muscle daily as needed for anaphylaxis.    [provider]  Insulin Pen Needle (PEN NEEDLES) 29G X MISC 1 each by Does not apply route daily. 02/12/21   Dorcas Carrow, MD  l-methylfolate-B6-B12 (METANX) 3-35-2 MG TABS tablet Take 1 tablet by mouth daily. 11/09/22   Everlena Cooper, Adam R, DO  LANTUS SOLOSTAR 100 UNIT/ML Solostar Pen Inject 45 Units into the skin at  bedtime. 02/23/21   [provider]  meclizine (ANTIVERT) 25 MG tablet Take 25 mg by mouth 3 (three) times daily as needed for dizziness.    [provider]  pregabalin (LYRICA) 150 MG capsule Take 1 capsule (150 mg total) by mouth 3 (three) times daily. Patient taking differently: Take 50 mg by mouth 3 (three) times daily. 1-3 times a day 05/07/22   Shon Millet R, DO  sacubitril-valsartan (ENTRESTO) 97-103 MG TAKE 1 TABLET BY MOUTH 2 (TWO) TIMES DAILY. PT WILL CALL FOR REFILL 05/13/22   Swaziland, Peter M, MD  traMADol (ULTRAM) 50 MG tablet TAKE 1 TABLET BY MOUTH 3 TIMES A DAY AS NEEDED FOR MODERATE PAIN 02/22/23   Drema Dallas, DO    Family History    Family History  Problem Relation Age of Onset   Hypertension Mother    Other Mother        blood disorder  Hypertension Father    Stroke Father    Diabetes Maternal Aunt    CVA Maternal Aunt    She indicated that her mother is alive. She indicated that her father is alive. She indicated that her maternal aunt is alive.  Social History    Social History   Socioeconomic History   Marital status: Married    Spouse name: Thurmond Butts   Number of children: 0   Years of education: Bachelors   Highest education level: Not on file  Occupational History   Occupation: Homemaker now  Tobacco Use   Smoking status: Never   Smokeless tobacco: Never  Vaping Use   Vaping Use: Never used  Substance and Sexual Activity   Alcohol use: Yes    Comment: rarely   Drug use: Never   Sexual activity: Yes    Birth control/protection: None  Other Topics Concern   Not on file  Social History Narrative   Lives at home with husband.in a one story home   Right-handed.   No daily use of caffeine.   Social Determinants of Health   Financial Resource Strain: Not on file  Food Insecurity: Not on file  Transportation Needs: Not on file  Physical Activity: Not on file  Stress: Not on file  Social Connections: Not on file  Intimate Partner  Violence: Not on file     Review of Systems    General:  No chills, fever, night sweats or weight changes.  Cardiovascular:  No chest pain, dyspnea on exertion, edema, orthopnea, palpitations, paroxysmal nocturnal dyspnea. Dermatological: No rash, lesions/masses Respiratory: No cough, dyspnea Urologic: No hematuria, dysuria Abdominal:   No nausea, vomiting, diarrhea, bright red blood per rectum, melena, or hematemesis Neurologic:  No visual changes, wkns, changes in mental status. All other systems reviewed and are otherwise negative except as noted above.  Physical Exam    VS:  BP 118/82   Pulse (!) 103   Ht 5\' 6"  (1.676 m)   Wt 186 lb (84.4 kg)   LMP 11/25/2011   SpO2 95%   BMI 30.02 kg/m  , BMI Body mass index is 30.02 kg/m. GEN: Well nourished, well developed, in no acute distress. HEENT: normal. Neck: Supple, no JVD, carotid bruits, or masses. Cardiac: RRR, no murmurs, rubs, or gallops. No clubbing, cyanosis, edema.  Radials/DP/PT 2+ and equal bilaterally.  Respiratory:  Respirations regular and unlabored, clear to auscultation bilaterally. GI: Soft, nontender, nondistended, BS + x 4. MS: no deformity or atrophy. Skin: warm and dry, no rash. Neuro:  Strength and sensation are intact. Psych: Normal affect.  Accessory Clinical Findings    Recent Labs: No results found for requested labs within last 365 days.   Recent Lipid Panel    Component Value Date/Time   CHOL 98 (L) 03/22/2022 1038   TRIG 78 03/22/2022 1038   HDL 44 03/22/2022 1038   CHOLHDL 2.2 03/22/2022 1038   CHOLHDL 8.4 03/13/2021 0445   VLDL 77 (H) 03/13/2021 0445   LDLCALC 38 03/22/2022 1038         ECG personally reviewed by me today-sinus tachycardia possible left atrial enlargement possible inferior infarct undetermined age 1 bpm- No acute changes   Echocardiogram 03/23/22: IMPRESSIONS    1. Left ventricular ejection fraction, by estimation, is 60 to 65%. The  left ventricle has normal  function. The left ventricle has no regional  wall motion abnormalities. There is moderate left ventricular hypertrophy.  Left ventricular diastolic  parameters were normal.  2. Right ventricular systolic function is normal. The right ventricular  size is normal. Tricuspid regurgitation signal is inadequate for assessing  PA pressure.   3. The mitral valve is normal in structure. Trivial mitral valve  regurgitation.   4. The aortic valve was not well visualized. Aortic valve regurgitation  is not visualized. No aortic stenosis is present.   5. The inferior vena cava is normal in size with greater than 50%  respiratory variability, suggesting right atrial pressure of 3 mmHg.  Assessment & Plan   1.  Fatigue-reports increased fatigue over the past several months.  Was noted to have increased tiredness during last cardiology visit. Request recent labs from PCP Increase coreg to 18.75 and keep 12.5 second dose. Order echocardiogram  Chronic systolic CHF-echocardiogram 4/23 showed normal EF and moderate LVH, and no significant valvular abnormalities.  Spironolactone intolerant due to dizziness Continue Entresto, carvedilol  Increase physical activity as tolerated  Essential hypertension-BP today 118/82. Continue Entresto, carvedilol Maintain blood pressure log Low-sodium diet  Hyperlipidemia-LDL 55 on last check. Continue current medical therapy High-fiber diet  Disposition: Follow-up with Dr. Swaziland or me in 1-2 months.   Thomasene Ripple. Brindle Leyba NP-C     04/07/2023, 9:23 AM [facility] [address] Office [phone] Fax [phone]    I spent 14 minutes examining this patient, reviewing medications, and using patient centered shared decision making involving her cardiac care.  Prior to her visit I spent greater than 20 minutes reviewing her past medical history,  medications, and prior cardiac tests.

## 2023-04-07 ENCOUNTER — Ambulatory Visit: Payer: 59 | Attending: General Practice | Admitting: General Practice

## 2023-04-07 ENCOUNTER — Encounter: Payer: Self-pay | Admitting: General Practice

## 2023-04-07 VITALS — BP 118/82 | HR 103 | Ht 66.0 in | Wt 186.0 lb

## 2023-04-07 DIAGNOSIS — I10 Essential (primary) hypertension: Secondary | ICD-10-CM

## 2023-04-07 DIAGNOSIS — I5022 Chronic systolic (congestive) heart failure: Secondary | ICD-10-CM | POA: Diagnosis not present

## 2023-04-07 DIAGNOSIS — E782 Mixed hyperlipidemia: Secondary | ICD-10-CM

## 2023-04-07 DIAGNOSIS — R5383 Other fatigue: Secondary | ICD-10-CM | POA: Diagnosis not present

## 2023-04-07 MED ORDER — CARVEDILOL 12.5 MG PO TABS: ORAL_TABLET | ORAL | 3 refills | Status: DC

## 2023-04-07 NOTE — Patient Instructions (Signed)
Medication Instructions:  TAKE YOUR CARVEDILOL 1&1/2 TAB (18.75) IN THE AM AND CARVEDILOL 1 TAB (12.5MG ) IN THE PM *If you need a refill on your cardiac medications before your next appointment, please call your pharmacy*   Lab Work: NONE-WILL GET FROM YOUR PCP If you have labs (blood work) drawn today and your tests are completely normal, you will receive your results only by:  MyChart Message (if you have MyChart) OR  A paper copy in the mail If you have any lab test that is abnormal or we need to change your treatment, we will call you to review the results.  Testing/Procedures: Echocardiogram - Your physician has requested that you have an echocardiogram. Echocardiography is a painless test that uses sound waves to create images of your heart. It provides your doctor with information about the size and shape of your heart and how well your heart's chambers and valves are working. This procedure takes approximately one hour. There are no restrictions for this procedure.    Follow-Up: At [facility], you and your health needs are our priority.  As part of our continuing mission to provide you with exceptional heart care, we have created designated Provider Care Teams.  These Care Teams include your primary Cardiologist (physician) and Advanced Practice Providers (APPs -  Physician Assistants and Nurse Practitioners) who all work together to provide you with the care you need, when you need it.  Your next appointment:   1-2 month(s)  Provider:   Peter Swaziland, MD  or Edd Fabian, FNP        Other Instructions KEEP YOUR NEUROLOGY APPT

## 2023-05-05 ENCOUNTER — Ambulatory Visit (HOSPITAL_COMMUNITY): Payer: 59 | Attending: General Practice

## 2023-05-05 DIAGNOSIS — I5022 Chronic systolic (congestive) heart failure: Secondary | ICD-10-CM

## 2023-05-05 LAB — ECHOCARDIOGRAM COMPLETE
Area-P 1/2: 5.42 cm2
S' Lateral: 2.9 cm

## 2023-05-10 NOTE — Progress Notes (Unsigned)
NEUROLOGY FOLLOW UP OFFICE NOTE  Sheila Guerrero [id number]  Assessment/Plan:   Thalamic pain syndrome Hypersomnia and excessive fatigue - may be secondary to thalamic stroke but must rule out OSA or other sleep disorder History of right thalamic stroke, likely secondary to small vessel disease Type 2 diabetes mellitus Hypertension Hyperlipidemia     For thalamic pain:             - Increase Cymbalta to 90mg  daily (also for depression) - Lyrica 50mg  QD-TID (as prescribed by her PCP)             - Tramadol 50mg  1/2-1 tablet one to two times (as prescribed by her PCP) 2  Follow up for sleep study.  If testing negative for OSA or other primary sleep disorder, would start modafinil.   3  Metanx  4  Secondary stroke prevention as managed by PCP/cardiology:             - Plavix 75mg  daliy             - Statin therapy.  LDL goal less than 70             - Glycemic control. Hgb a1c goal less than 7             - Normotensive blood pressure 4  Exercise 5  Follow up 6 months.      Subjective:  Sheila Guerrero is a 62 year old right-handed female with CHF, HTN, HLD, DM II, asthma, recurrent angioedema and anxiety with panic attacks who follows up for thalamic pain syndrome.  She is accompanied by her husband who supplements history.  UPDATE: Lyrica 50mg  in AM and 100mg  PM (higher doses increased somnolence), Cymbalta 60mg  daily (also for depression) and tramadol 50mg  QD-BID PRN.   Finished [facility] at the end of last year because her therapist left.  Continues to have excessive fatigue.  She followed up with cardiology.  Repeat echo this month was without significant abnormalities.  She saw sleep medicine who suspects OSA.  She continues to have neuropathic pain.  She reports dizziness.  Sometimes she has spinning sensation.  Other times she feels unsteady on her feet and shaky.  Needs to hold onto something to prevent her from falling.      HISTORY:  Patient  reportedly had a stroke in 2017-2018 that reportedly presented with confusion.  She later saw a neurologist.  MRI of brain personally reviewed on 03/06/2017 showed chronic left frontal subcortical lacunar infact and right thalamic chronic cerebral microhemorrhage vs dystrophic mineralization.  She was started on ASA. She has not worked since this stroke.     In February 2022, she develop left sided upper and lower weakness and numbness.  She was treated for probable pinched nerve.  That march, she went to the ED due to worsening low back pain and was found to have a right thalamic/capsular stroke.  MRA of head and neck showed severe right MCA stenosis and severe distal right V4 stenosis.  Carotid ultrasound unremarkable.  TEE revealed vegetation on the mitral and tricuspid valves.  Evaluated by ID.  Although blood cultures were negative decision was made to treat with antibiotics.  She was discharged on ASA and Plavix DAPT followed by Plavix monotherapy.  In April 2022, she was readmitted to the hospital with confusion, speech disturbance and gait instability.  MRI of brain showed subacute left thalamic infarct.  EEG was normal.  Repeat TEE showed EF 45-50%  with smaller vegetation on the mitral and tricuspid valves.  She followed up with cardiology who did not believe findings on TEE were vegetation but rather fine filamentous strands of uncertain significance.  Ultimately, her strokes were believed to be due to small vessel disease.  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 She takes Lyrica 150mg  TID, Cymbalta 60mg  daily (also for depression) and tramadol 50mg  BID PRN.  Regimen is effective but causes drowsiness.  Once she had hallucinations (seeing things that quickly moved across her vision).  Therefore, she has cut back on dosing of Lyrica and tramadol to twice  daily, which has helped alertness but notes some increased pain.      03/13/2021 MRI BRAIN W WO/MRA HEAD & NECK:  1. Acute to early subacute right and likely late subacute left thalamic infarcts. No mass effect or acute hemorrhage. 2. Severe stenosis of the distal right MCA M1 segment. 3. Severe stenosis of the distal right V4 segment. 4. No stenosis of the carotid or vertebral arteries in the neck.  02/09/2021 MRI BRAIN:  1. Patchy post-contrast enhancement about the recently identified right thalamocapsular and left occipital infarcts, in keeping with early subacute ischemic infarcts. No associated mass effect.  2. Underlying age-related cerebral atrophy with moderately advanced chronic microvascular ischemic disease. No other new intracranial abnormality on this limited postcontrast only exam. 02/07/2021 MRI BRAIN:  1. Small acute to early subacute infarct in the right thalamus/posterior limb of internal capsule. 2. Small subacute left occipital lobe infarct. 3. Moderately advanced chronic small vessel ischemic disease, progressed from 2018. 4. Numerous chronic microhemorrhages consistent with chronic hypertension. 02/07/2021 MRA HEAD:  1. Intracranial atherosclerosis including severe right M1 and moderate right V4 stenoses. 2. No large vessel occlusion. 03/02/2017 MRI BRAIN:  1. Round and ovoid periventricular and subcortical foci of T2 hyperintensites. Left frontal subcortical cystic gliosis vs chronic lacunar infarction. Left pontine T2 hyperintensity. Considerations include chronic small vessel ischemic disease, autoimmune, inflammatory or post-infectious etiologies.    2. Right thalamic chronic cerebral microhemorrhage vs dystrophic mineralization.  3. No acute findings.   PAST MEDICAL HISTORY: Past Medical History:  Diagnosis Date   Angioedema    Anxiety    Asthma    Depression    Diabetes mellitus, type II (HCC)    Dyslipidemia    Family history of adverse reaction to anesthesia     mother severely confused after general anesthesia   History of CVA (cerebrovascular accident)    HTN (hypertension) 2013   Panic attacks    Pneumonia    Septic embolism (HCC) 02/10/2021   Situational stress    Subacute endocarditis 02/10/2021    MEDICATIONS: Current Outpatient Medications on File Prior to Visit  Medication Sig Dispense Refill   atorvastatin (LIPITOR) 80 MG tablet TAKE 1 TABLET BY MOUTH EVERY DAY 90 tablet 3   carvedilol (COREG) 12.5 MG tablet Take 1.5 tablets (18.75 mg total) by mouth every morning AND 1 tablet (12.5 mg total) every evening. 75 tablet 3   clopidogrel (PLAVIX) 75 MG tablet Take 1 tablet (75 mg total) by mouth daily. 90 tablet 3   DULoxetine (CYMBALTA) 60 MG capsule Take 1 capsule (60 mg total) by mouth daily. 90 capsule 1   EPINEPHrine 0.3 mg/0.3 mL IJ SOAJ injection Inject 0.3 mg into the muscle daily as needed for anaphylaxis.     Insulin Pen Needle (  PEN NEEDLES) 29G X MISC 1 each by Does not apply route daily. 30 each 2   l-methylfolate-B6-B12 (METANX) 3-35-2 MG TABS tablet Take 1 tablet by mouth daily. 90 tablet 1   LANTUS SOLOSTAR 100 UNIT/ML Solostar Pen Inject 45 Units into the skin at bedtime.     meclizine (ANTIVERT) 25 MG tablet Take 25 mg by mouth 3 (three) times daily as needed for dizziness.     pregabalin (LYRICA) 150 MG capsule Take 1 capsule (150 mg total) by mouth 3 (three) times daily. (Patient taking differently: Take 50 mg by mouth 3 (three) times daily. 1-3 times a day) 90 capsule 5   sacubitril-valsartan (ENTRESTO) 97-103 MG TAKE 1 TABLET BY MOUTH 2 (TWO) TIMES DAILY. PT WILL CALL FOR REFILL 180 tablet 3   traMADol (ULTRAM) 50 MG tablet TAKE 1 TABLET BY MOUTH 3 TIMES A DAY AS NEEDED FOR MODERATE PAIN 90 tablet 2   No current facility-administered medications on file prior to visit.    ALLERGIES: Allergies  Allergen Reactions   Sulfa Antibiotics Swelling   Aldactone [Spironolactone]     Inner ear infection    Dulaglutide      Other reaction(s): rash   Empagliflozin     Other reaction(s): recurrent yeast   Iodinated Contrast Media     Other reaction(s): angioedema with CT dye   Latex Rash    FAMILY HISTORY: Family History  Problem Relation Age of Onset   Hypertension Mother    Other Mother        blood disorder   Hypertension Father    Stroke Father    Diabetes Maternal Aunt    CVA Maternal Aunt       Objective:  Blood pressure 128/88, pulse 92, height 5\' 5"  (1.651 m), weight 187 lb 9.6 oz (85.1 kg), last menstrual period 11/25/2011, SpO2 93 %. General: No acute distress.  Patient appears well-groomed.   Head:  Normocephalic/atraumatic Eyes:  Fundi examined but not visualized Neck: supple, no paraspinal tenderness, full range of motion Heart:  Regular rate and rhythm Neurological Exam: alert and oriented.  Speech fluent and not dysarthric, language intact.  CN II-XII intact. Bulk and tone normal, muscle strength 5/5 throughout.  Sensation to pinprick reduced in left lower extremity.  Vibratory sensation intact.  Deep tendon reflexes 2+ throughout.  Finger to nose testing with slight ataxia on left.  Gait cautious but steady.  Able to turn.  Able to tandem walk but unsteady.  Romberg with mild sway.   Shon Millet, DO  CC: Tally Joe, MD

## 2023-05-11 ENCOUNTER — Ambulatory Visit: Payer: 59 | Admitting: Student

## 2023-05-11 ENCOUNTER — Ambulatory Visit: Payer: 59 | Admitting: Neurology

## 2023-05-11 ENCOUNTER — Encounter: Payer: Self-pay | Admitting: Neurology

## 2023-05-11 VITALS — BP 128/88 | HR 92 | Ht 65.0 in | Wt 187.6 lb

## 2023-05-11 DIAGNOSIS — E1169 Type 2 diabetes mellitus with other specified complication: Secondary | ICD-10-CM

## 2023-05-11 DIAGNOSIS — R5383 Other fatigue: Secondary | ICD-10-CM

## 2023-05-11 DIAGNOSIS — G4710 Hypersomnia, unspecified: Secondary | ICD-10-CM | POA: Diagnosis not present

## 2023-05-11 DIAGNOSIS — G890 Central pain syndrome: Secondary | ICD-10-CM | POA: Diagnosis not present

## 2023-05-11 DIAGNOSIS — E785 Hyperlipidemia, unspecified: Secondary | ICD-10-CM

## 2023-05-11 DIAGNOSIS — I10 Essential (primary) hypertension: Secondary | ICD-10-CM

## 2023-05-11 MED ORDER — DULOXETINE HCL 30 MG PO CPEP
90.0000 mg | ORAL_CAPSULE | Freq: Every day | ORAL | 5 refills | Status: DC
Start: 2023-05-11 — End: 2024-01-23

## 2023-05-11 NOTE — Patient Instructions (Signed)
Increase duloxetine to 90mg  daily to try and further help with pain Have the sleep study. If no sleep apnea or other sleep disorder to explain the somnolence/fatigue, would start modafinil  Consider seeing podiatry Continue home exercises

## 2023-05-17 NOTE — Progress Notes (Unsigned)
Cardiology Clinic Note   Patient Name: Sheila Guerrero Date of Encounter: 05/26/2023  Primary Care Provider:  Tally Joe, MD Primary Cardiologist:  Peter Swaziland, MD  Patient Profile    Sheila Guerrero 62 year old female presents the clinic today for follow-up evaluation of her fatigue.  Past Medical History    Past Medical History:  Diagnosis Date   Angioedema    Anxiety    Asthma    Depression    Diabetes mellitus, type II (HCC)    Dyslipidemia    Family history of adverse reaction to anesthesia    mother severely confused after general anesthesia   History of CVA (cerebrovascular accident)    HTN (hypertension) 2013   Panic attacks    Pneumonia    Septic embolism (HCC) 02/10/2021   Situational stress    Subacute endocarditis 02/10/2021   Past Surgical History:  Procedure Laterality Date   BUBBLE STUDY  02/10/2021   Procedure: BUBBLE STUDY;  Surgeon: Orpah Cobb, MD;  Location: [facility] ENDOSCOPY;  Service: Cardiovascular;;   CHOLECYSTECTOMY  2010   TEE WITHOUT CARDIOVERSION N/A 02/10/2021   Procedure: TRANSESOPHAGEAL ECHOCARDIOGRAM (TEE);  Surgeon: Orpah Cobb, MD;  Location: [facility] ENDOSCOPY;  Service: Cardiovascular;  Laterality: N/A;   TEE WITHOUT CARDIOVERSION N/A 03/27/2021   Procedure: TRANSESOPHAGEAL ECHOCARDIOGRAM (TEE);  Surgeon: Orpah Cobb, MD;  Location: [facility] ENDOSCOPY;  Service: Cardiovascular;  Laterality: N/A;    Allergies  Allergies  Allergen Reactions   Sulfa Antibiotics Swelling   Aldactone [Spironolactone]     Inner ear infection    Dulaglutide     Other reaction(s): rash   Empagliflozin     Other reaction(s): recurrent yeast   Iodinated Contrast Media     Other reaction(s): angioedema with CT dye   Latex Rash    History of Present Illness    Sheila Guerrero has a PMH of CVA 2017, HTN, chronic sinusitis, type 2 diabetes, gait abnormality, bacteremia, and chronic systolic CHF.  She was admitted 3/22 with left-sided  paresthesia and progressive left-sided weakness.  She was diagnosed with acute CVA.  Her brain MRI showed small acute to early subacute infarct of her right thalamus/posterior limb of the internal capsule, small acute left subdural lobe infarct, and numerous chronic microhemorrhages consistent with chronic hypertension.  Her carotid Doppler was negative.  Her echocardiogram showed an EF of 40-45% which was new when compared to her 2018 echo.  She underwent subsequent TEE which showed multiple small vegetations on both mitral valve leaflets with moderate MR and larger vegetation on tricuspid valve.  She had no fever or other source of infection.  Her blood cultures were negative.  She was treated for culture-negative endocarditis with IV Rocephin and daptomycin.  She was discharged on Entresto and beta-blocker.  She was readmitted in April with altered mental status and difficulty speaking and walking.  MRI showed left thymic infarct CT head was negative.  Her EEG was normal.  Her TEE was repeated 4/29 and showed an EF of 45-50%.  Small vegetations on medial mitral valve leaflet and tricuspid valve were noted.  Small vegetation on noncoronary AV cusp.  Mild MR and TR.  Dr. Swaziland reviewed her echocardiogram and did not feel there were convincing evidence of vegetations.  She was noted to have some fine filamentous strands on the mitral valve of uncertain significance.  Due to lack of any evidence of infection and normal blood cultures it was felt that she never had endocarditis.  She was  seen in follow-up by Dr. Swaziland 11/12/2022.  During that time she was doing well.  She was recovering from her stroke.  Dr. Anne Hahn had switched her to Plavix monotherapy.  She had repeat echo in April which showed LV function of 60-65%.  She denied dizziness, chest pain and palpitations.  She was noted to have a frozen left shoulder.  She was receiving cortisone injections and her blood glucose had gone up.  She noted feeling  tired a significant amount of the time.  She had no new neurologic symptoms.  Her carvedilol dose had previously been decreased due to low blood pressure.  She presented to the clinic 04/07/23 for evaluation of her fatigue.  She stated she  had worsening fatigue since she was seen last by cardiology.  We reviewed her stroke history.  She also had upcoming appointment with neurology.  Her EKG showed sinus tachycardia 103 bpm.  She did not have a formal exercise routine and was limited in her mobility due to her prior CVAs.  I increased her carvedilol to 18.7 5 in the morning and continued her 12.5 mg dose in the afternoon.  I planned a repeat  echocardiogram and follow-up in 1 to 2 months.  I requested recent labs from her PCP.  Her echocardiogram 05/05/2023 showed an EF of 65-70%, G1 DD, trivial mitral valve regurgitation and no significant changes from her prior echocardiogram.  She presents to clinic today for follow-up evaluation and states she continues to note increased fatigue.  Her echocardiogram is reassuring.  We reviewed her most recent lab work.  Her heart rate has improved with increase carvedilol.  She is following with neurology who have ordered a sleep evaluation.  She and her husband report they will do a home sleep study this week.  Neurology is also looking at prescribing stimulants if necessary.  She also has a follow-up appointment planned with endocrinology in a few months.  I will plan follow-up in 6 to 9 months and have her increase her physical activity as tolerated.  Today she denies chest pain, shortness of breath, lower extremity edema, palpitations, melena, hematuria, hemoptysis, diaphoresis, weakness, presyncope, syncope, orthopnea, and PND.   Home Medications    Prior to Admission medications   Medication Sig Start Date End Date Taking? Authorizing Provider  atorvastatin (LIPITOR) 80 MG tablet TAKE 1 TABLET BY MOUTH EVERY DAY 04/04/23   Swaziland, Peter M, MD  carvedilol (COREG)  12.5 MG tablet Take 1 tablet (12.5 mg total) by mouth 2 (two) times daily. 07/22/21 11/12/22  Swaziland, Peter M, MD  clopidogrel (PLAVIX) 75 MG tablet Take 1 tablet (75 mg total) by mouth daily. 05/13/22   Swaziland, Peter M, MD  DULoxetine (CYMBALTA) 60 MG capsule Take 1 capsule (60 mg total) by mouth daily. 11/09/22   Drema Dallas, DO  EPINEPHrine 0.3 mg/0.3 mL IJ SOAJ injection Inject 0.3 mg into the muscle daily as needed for anaphylaxis.    [provider]  Insulin Pen Needle (PEN NEEDLES) 29G X MISC 1 each by Does not apply route daily. 02/12/21   Dorcas Carrow, MD  l-methylfolate-B6-B12 (METANX) 3-35-2 MG TABS tablet Take 1 tablet by mouth daily. 11/09/22   Everlena Cooper, Adam R, DO  LANTUS SOLOSTAR 100 UNIT/ML Solostar Pen Inject 45 Units into the skin at bedtime. 02/23/21   [provider]  meclizine (ANTIVERT) 25 MG tablet Take 25 mg by mouth 3 (three) times daily as needed for dizziness.    [provider]  pregabalin (LYRICA) 150 MG capsule Take 1 capsule (150 mg total) by mouth 3 (three) times daily. Patient taking differently: Take 50 mg by mouth 3 (three) times daily. 1-3 times a day 05/07/22   Shon Millet R, DO  sacubitril-valsartan (ENTRESTO) 97-103 MG TAKE 1 TABLET BY MOUTH 2 (TWO) TIMES DAILY. PT WILL CALL FOR REFILL 05/13/22   Swaziland, Peter M, MD  traMADol (ULTRAM) 50 MG tablet TAKE 1 TABLET BY MOUTH 3 TIMES A DAY AS NEEDED FOR MODERATE PAIN 02/22/23   Drema Dallas, DO    Family History    Family History  Problem Relation Age of Onset   Hypertension Mother    Other Mother        blood disorder   Hypertension Father    Stroke Father    Diabetes Maternal Aunt    CVA Maternal Aunt    She indicated that her mother is alive. She indicated that her father is alive. She indicated that her maternal aunt is alive.  Social History    Social History   Socioeconomic History   Marital status: Married    Spouse name: Thurmond Butts   Number of children: 0   Years of  education: Bachelors   Highest education level: Not on file  Occupational History   Occupation: Homemaker now  Tobacco Use   Smoking status: Never   Smokeless tobacco: Never  Vaping Use   Vaping Use: Never used  Substance and Sexual Activity   Alcohol use: Yes    Comment: rarely   Drug use: Never   Sexual activity: Yes    Birth control/protection: None  Other Topics Concern   Not on file  Social History Narrative   Lives at home with husband.in a one story home   Right-handed.   No daily use of caffeine.   Social Determinants of Health   Financial Resource Strain: Not on file  Food Insecurity: Not on file  Transportation Needs: Not on file  Physical Activity: Not on file  Stress: Not on file  Social Connections: Not on file  Intimate Partner Violence: Not on file     Review of Systems    General:  No chills, fever, night sweats or weight changes.  Cardiovascular:  No chest pain, dyspnea on exertion, edema, orthopnea, palpitations, paroxysmal nocturnal dyspnea. Dermatological: No rash, lesions/masses Respiratory: No cough, dyspnea Urologic: No hematuria, dysuria Abdominal:   No nausea, vomiting, diarrhea, bright red blood per rectum, melena, or hematemesis Neurologic:  No visual changes, wkns, changes in mental status. All other systems reviewed and are otherwise negative except as noted above.  Physical Exam    VS:  BP 116/70   Pulse 92   Ht 5\' 5"  (1.651 m)   Wt 186 lb 3.2 oz (84.5 kg)   LMP 11/25/2011   SpO2 96%   BMI 30.99 kg/m  , BMI Body mass index is 30.99 kg/m. GEN: Well nourished, well developed, in no acute distress. HEENT: normal. Neck: Supple, no JVD, carotid bruits, or masses. Cardiac: RRR, no murmurs, rubs, or gallops. No clubbing, cyanosis, edema.  Radials/DP/PT 2+ and equal bilaterally.  Respiratory:  Respirations regular and unlabored, clear to auscultation bilaterally. GI: Soft, nontender, nondistended, BS + x 4. MS: no deformity or  atrophy. Skin: warm and dry, no rash. Neuro:  Strength and sensation are intact. Psych: Normal affect.  Accessory Clinical Findings    Recent Labs: No results found for requested labs within last 365 days.   Recent Lipid Panel  Component Value Date/Time   CHOL 98 (L) 03/22/2022 1038   TRIG 78 03/22/2022 1038   HDL 44 03/22/2022 1038   CHOLHDL 2.2 03/22/2022 1038   CHOLHDL 8.4 03/13/2021 0445   VLDL 77 (H) 03/13/2021 0445   LDLCALC 38 03/22/2022 1038         ECG personally reviewed by me today-none today.  EKG 04/07/2023 sinus tachycardia possible left atrial enlargement possible inferior infarct undetermined age 64 bpm- No acute changes   Echocardiogram 03/23/22: IMPRESSIONS    1. Left ventricular ejection fraction, by estimation, is 60 to 65%. The  left ventricle has normal function. The left ventricle has no regional  wall motion abnormalities. There is moderate left ventricular hypertrophy.  Left ventricular diastolic  parameters were normal.   2. Right ventricular systolic function is normal. The right ventricular  size is normal. Tricuspid regurgitation signal is inadequate for assessing  PA pressure.   3. The mitral valve is normal in structure. Trivial mitral valve  regurgitation.   4. The aortic valve was not well visualized. Aortic valve regurgitation  is not visualized. No aortic stenosis is present.   5. The inferior vena cava is normal in size with greater than 50%  respiratory variability, suggesting right atrial pressure of 3 mmHg.  Echocardiogram 05/05/2023  IMPRESSIONS   1. Left ventricular ejection fraction, by estimation, is 65 to 70%. The left ventricle has normal function. The left ventricle has no regional wall motion abnormalities. There is moderate concentric left ventricular hypertrophy. Left ventricular  diastolic parameters are consistent with Grade I diastolic dysfunction (impaired relaxation). 2. Right ventricular systolic function is  normal. The right ventricular size is normal. Tricuspid regurgitation signal is inadequate for assessing PA pressure. 3. The mitral valve is grossly normal. Trivial mitral valve regurgitation. 4. The aortic valve is tricuspid. Aortic valve regurgitation is not visualized. 5. The inferior vena cava is normal in size with greater than 50% respiratory variability, suggesting right atrial pressure of 3 mmHg.  Comparison(s): Changes from prior study are noted. 03/23/2022: LVEF 60-65%.  FINDINGS Left Ventricle: Left ventricular ejection fraction, by estimation, is 65 to 70%. The left ventricle has normal function. The left ventricle has no regional wall motion abnormalities. The left ventricular internal cavity size was normal in size. There is moderate concentric left ventricular hypertrophy. Left ventricular diastolic parameters are consistent with Grade I diastolic dysfunction (impaired relaxation). Indeterminate filling pressures.  Right Ventricle: The right ventricular size is normal. No increase in right ventricular wall thickness. Right ventricular systolic function is normal. Tricuspid regurgitation signal is inadequate for assessing PA pressure.  Left Atrium: Left atrial size was normal in size.  Right Atrium: Right atrial size was normal in size.  Pericardium: There is no evidence of pericardial effusion.  Mitral Valve: The mitral valve is grossly normal. Trivial mitral valve regurgitation.  Tricuspid Valve: The tricuspid valve is normal in structure. Tricuspid valve regurgitation is not demonstrated.  Aortic Valve: The aortic valve is tricuspid. Aortic valve regurgitation is not visualized.  Pulmonic Valve: The pulmonic valve was normal in structure. Pulmonic valve regurgitation is not visualized.  Aorta: The aortic root and ascending aorta are structurally normal, with no evidence of dilitation.  Venous: The inferior vena cava is normal in size with greater than 50% respiratory  variability, suggesting right atrial pressure of 3 mmHg.  IAS/Shunts: No atrial level shunt detected by color flow Doppler.   Assessment & Plan   1.  Fatigue-recent echocardiogram 05/05/2023 reassuring.  Details above.  Stable fatigue.  Heart rate improved with up titration of carvedilol in the morning.  Has a home sleep study scheduled for this week and is following with neurology as well as endocrinology. Continue coreg to 18.75 and keep 12.5 second dose. Increase physical activity as tolerated  Chronic systolic CHF-reassuring echocardiogram.  Details above.  Was intolerant of spironolactone due to dizziness Continue Entresto, carvedilol  Increase physical activity as tolerated  Essential hypertension-BP today 116/82. Continue Entresto, carvedilol Maintain blood pressure log Low-sodium diet   Disposition: Follow-up with Dr. Swaziland or me in 6-9 months.   Thomasene Ripple. Tomoki Lucken NP-C     05/26/2023, 3:50 PM [facility] [address] Office [phone] Fax [phone]    I spent 13 minutes examining this patient, reviewing medications, and using patient centered shared decision making involving her cardiac care.  Prior to her visit I spent greater than 20 minutes reviewing her past medical history,  medications, and prior cardiac tests.

## 2023-05-26 ENCOUNTER — Encounter: Payer: Self-pay | Admitting: General Practice

## 2023-05-26 ENCOUNTER — Ambulatory Visit: Payer: 59 | Attending: General Practice | Admitting: General Practice

## 2023-05-26 VITALS — BP 116/70 | HR 92 | Ht 65.0 in | Wt 186.2 lb

## 2023-05-26 DIAGNOSIS — I10 Essential (primary) hypertension: Secondary | ICD-10-CM

## 2023-05-26 DIAGNOSIS — R5383 Other fatigue: Secondary | ICD-10-CM | POA: Diagnosis not present

## 2023-05-26 DIAGNOSIS — I5022 Chronic systolic (congestive) heart failure: Secondary | ICD-10-CM | POA: Diagnosis not present

## 2023-05-26 NOTE — Patient Instructions (Signed)
Medication Instructions:  The current medical regimen is effective;  continue present plan and medications as directed. Please refer to the Current Medication list given to you today.  *If you need a refill on your cardiac medications before your next appointment, please call your pharmacy*  Follow-Up: At [facility], you and your health needs are our priority.  As part of our continuing mission to provide you with exceptional heart care, we have created designated Provider Care Teams.  These Care Teams include your primary Cardiologist (physician) and Advanced Practice Providers (APPs -  Physician Assistants and Nurse Practitioners) who all work together to provide you with the care you need, when you need it.  Your next appointment:   6-9 month(s)  Provider:   Peter Swaziland, MD     Other Instructions

## 2023-05-29 ENCOUNTER — Other Ambulatory Visit: Payer: Self-pay | Admitting: Neurology

## 2023-06-18 ENCOUNTER — Other Ambulatory Visit: Payer: Self-pay | Admitting: Neurology

## 2023-06-22 ENCOUNTER — Telehealth: Payer: Self-pay | Admitting: Neurology

## 2023-06-22 NOTE — Telephone Encounter (Signed)
Per patient last note 05/11/23,  2  Follow up for sleep study.  If testing negative for OSA or other primary sleep disorder, would start modafinil.   So patient wanted to know what to do next.

## 2023-06-22 NOTE — Telephone Encounter (Signed)
Patient called to give sleep study results. Patient was diagnosed with 79% an hour and Oxygen levels are low during the night, below 80. Patient is requesting a call phone back /kb

## 2023-06-24 NOTE — Telephone Encounter (Signed)
Patient advised of Dr.Jaffe note, I don't know the criteria for diagnosing sleep apnea.  If the test is diagnostic for sleep apnea, then she needs to be treated for sleep apnea, not start modafinil.    Per Patient she was advised,  50% of her occurrence were central sleep apnea no breathing issues.    Advised patient I haven't seen the records come through will have front desk scan to [facility] once it does so he can review.

## 2023-06-27 NOTE — Telephone Encounter (Signed)
Patient advised of Dr.Jaffe note, Report states severe sleep apnea and CPAP recommended.   A CPAP would be the appropriate therapy, not modafinil.  Any further questions should be addressed to the sleep specialist.

## 2023-06-27 NOTE — Telephone Encounter (Signed)
Patient's husband called for sleep study results [phone]

## 2023-06-29 ENCOUNTER — Other Ambulatory Visit: Payer: Self-pay | Admitting: Cardiology

## 2023-07-22 ENCOUNTER — Other Ambulatory Visit: Payer: Self-pay | Admitting: Neurology

## 2023-08-09 ENCOUNTER — Other Ambulatory Visit: Payer: Self-pay | Admitting: General Practice

## 2023-09-12 ENCOUNTER — Other Ambulatory Visit: Payer: Self-pay | Admitting: Cardiology

## 2023-10-20 ENCOUNTER — Ambulatory Visit (INDEPENDENT_AMBULATORY_CARE_PROVIDER_SITE_OTHER): Payer: 59

## 2023-10-20 ENCOUNTER — Encounter: Payer: Self-pay | Admitting: Podiatry

## 2023-10-20 ENCOUNTER — Ambulatory Visit: Payer: 59 | Admitting: Podiatry

## 2023-10-20 VITALS — Ht 65.0 in | Wt 186.0 lb

## 2023-10-20 DIAGNOSIS — M21862 Other specified acquired deformities of left lower leg: Secondary | ICD-10-CM

## 2023-10-20 DIAGNOSIS — M2042 Other hammer toe(s) (acquired), left foot: Secondary | ICD-10-CM | POA: Diagnosis not present

## 2023-10-20 DIAGNOSIS — M205X2 Other deformities of toe(s) (acquired), left foot: Secondary | ICD-10-CM | POA: Diagnosis not present

## 2023-10-20 NOTE — Progress Notes (Signed)
Subjective:  Patient ID: Sheila Guerrero, female    DOB: 12/30/1960,  MRN: [id number]  Chief Complaint  Patient presents with   Hammer Toe    new pt-hammertoe of second toe of left foot-non req-Dr. Tally Joe refer    Discussed the use of AI scribe software for clinical note transcription with the patient, who gave verbal consent to proceed.  History of Present Illness   The patient, with a history of diabetes and three strokes affecting the left side, presents with sensitivity in the left foot and balance issues. She reports that the four toes on the left foot 'crawl down' when getting up and down from a chair or in and out of a car, causing her to lose balance. She also reports weakness in the left foot and leg, and a dragging sensation when walking. The patient's A1c is currently at 10, up from a previous level of 6. She has never had issues with the toes before the strokes and no family history of hammer toes. The patient also reports a loss of grip in the left foot, which has led to her no longer being able to wear shoes without backs. Despite investing in good shoes and undergoing six months of integrative therapies, including massage of the muscle beneath the toes and dry needling, the patient's condition has not improved.          Objective:    Physical Exam   MUSCULOSKELETAL: Semi-reducible hammer toe contractures in the left foot, most pronounced in the second toe, followed by the third and fourth toes. Adducto varus deformity of the fifth toe. Dorsiflexion of toes and foot elicits myotonic contracture and spasm without clonus. SKIN: Good perfusion with 2+ dorsalis pedis and posterior tibial pulses. No active open lesions, ulcerations, or calluses on the left foot. Digital contractures with slight divergent changes in the interphalangeal joints of the second and third toes.       No images are attached to the encounter.    Results   LABS A1c:  10  RADIOLOGY Radiographs of left foot: No acute fractures or stress fractures. Digital contracture noted with slight degenerative changes in the interphalangeal joints of the second and third toes. (10/20/2023)      Assessment:   1. Hammer toe of second toe of left foot   2. Contracture of toe of left foot   3. Acquired posterior equinus of left lower extremity      Plan:  Patient was evaluated and treated and all questions answered.  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 No pain or wound formation is noted. Physical therapy will be considered for training with the brace. If the brace is unsuccessful, surgical options such as tendon release will be considered.  Diabetes Mellitus   Her A1C has elevated to 10, up from 6. We encourage her to work on lowering her A1C to under 8% to reduce the risk of wound formation and infection, particularly given the foot issues.    Follow-up   We will monitor for any blistering, bruising, or callus formation on the toes, which could indicate excessive contraction leading to wound formation. She is to call the office with updates after receiving and using  the AFO brace.          No follow-ups on file.

## 2023-11-15 NOTE — Progress Notes (Unsigned)
NEUROLOGY FOLLOW UP OFFICE NOTE  CINNAMON VONGPHAKDY [id number]  Assessment/Plan:   Thalamic pain syndrome Obstructive sleep apnea - History of right thalamic stroke, likely secondary to small vessel disease Type 2 diabetes mellitus Hypertension Hyperlipidemia Memory deficits - likely related to OSA     For thalamic pain:             - Cymbalta 90mg  daily (also for depression) - Lyrica 50mg  QD-TID (as prescribed by her PCP)             - Tramadol 50mg  1/2-1 tablet one to two times (has not needed in awhile) 2  CPAP for treatment of OSA, as per sleep medicine  3  Metanx  4  Secondary stroke prevention as managed by PCP/cardiology:             - Plavix 75mg  daliy             - Statin therapy.  LDL goal less than 70             - Glycemic control. Hgb a1c goal less than 7             - Normotensive blood pressure 4  For treatment of left lower extremity muscle spasms and tightness:  - Start baclofen 10mg  TID as needed  - Refer to Sports Medicine for OMM 5  Follow up 5 months.   Total time spent in chart and face to face with patient and her husband:  43 minutes.     Subjective:  AMARII MIDGLEY is a 62 year old right-handed female with CHF, HTN, HLD, DM II, asthma, recurrent angioedema and anxiety with panic attacks who follows up for thalamic pain syndrome.  She is accompanied by her husband who supplements history.  UPDATE: Lyrica 50mg  in AM and 100mg  PM (higher doses increased somnolence), Cymbalta 90mg  daily (also for depression) and tramadol 50mg  QD-BID PRN (has not been taking).   She was diagnosed with severe OSA.  Now on CPAP.  Memory and fatigue improved.   Reports tightness in the left leg causing reduced mobility and affecting ability to ambulate and get in and out of chairs.  Has tried PT, massage and dry needling.  Sometimes gets painful muscle spasms in the left leg, particularly at night in bed.  Increased Cymbalta last visit to better address  neuropathic pain and depression.  Not sure if she has noticed any improvement.    HISTORY:  Patient reportedly had a stroke in 2017-2018 that reportedly presented with confusion.  She later saw a neurologist.  MRI of brain personally reviewed on 03/06/2017 showed chronic left frontal subcortical lacunar infact and right thalamic chronic cerebral microhemorrhage vs dystrophic mineralization.  She was started on ASA. She has not worked since this stroke.     In February 2022, she develop left sided upper and lower weakness and numbness.  She was treated for probable pinched nerve.  That march, she went to the ED due to worsening low back pain and was found to have a right thalamic/capsular stroke.  MRA of head and neck showed severe right MCA stenosis and severe distal right V4 stenosis.  Carotid ultrasound unremarkable.  TEE revealed vegetation on the mitral and tricuspid valves.  Evaluated by ID.  Although blood cultures were negative decision was made to treat with antibiotics.  She was discharged on ASA and Plavix DAPT followed by Plavix monotherapy.  In April 2022, she was readmitted to the hospital with confusion,  speech disturbance and gait instability.  MRI of brain showed subacute left thalamic infarct.  EEG was normal.  Repeat TEE showed EF 45-50% with smaller vegetation on the mitral and tricuspid valves.  She followed up with cardiology who did not believe findings on TEE were vegetation but rather fine filamentous strands of uncertain significance.  Ultimately, her strokes were believed to be due to small vessel disease.  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 Did not tolerate gabapentin.  She takes Lyrica 150mg  TID, Cymbalta 60mg  daily (also for depression) and tramadol 50mg  BID PRN.  Regimen is effective but causes drowsiness.  Once she had  hallucinations (seeing things that quickly moved across her vision).  Therefore, she has cut back on dosing of Lyrica and tramadol to twice daily, which has helped alertness but notes some increased pain.      03/13/2021 MRI BRAIN W WO/MRA HEAD & NECK:  1. Acute to early subacute right and likely late subacute left thalamic infarcts. No mass effect or acute hemorrhage. 2. Severe stenosis of the distal right MCA M1 segment. 3. Severe stenosis of the distal right V4 segment. 4. No stenosis of the carotid or vertebral arteries in the neck.  02/09/2021 MRI BRAIN:  1. Patchy post-contrast enhancement about the recently identified right thalamocapsular and left occipital infarcts, in keeping with early subacute ischemic infarcts. No associated mass effect.  2. Underlying age-related cerebral atrophy with moderately advanced chronic microvascular ischemic disease. No other new intracranial abnormality on this limited postcontrast only exam. 02/07/2021 MRI BRAIN:  1. Small acute to early subacute infarct in the right thalamus/posterior limb of internal capsule. 2. Small subacute left occipital lobe infarct. 3. Moderately advanced chronic small vessel ischemic disease, progressed from 2018. 4. Numerous chronic microhemorrhages consistent with chronic hypertension. 02/07/2021 MRA HEAD:  1. Intracranial atherosclerosis including severe right M1 and moderate right V4 stenoses. 2. No large vessel occlusion. 03/02/2017 MRI BRAIN:  1. Round and ovoid periventricular and subcortical foci of T2 hyperintensites. Left frontal subcortical cystic gliosis vs chronic lacunar infarction. Left pontine T2 hyperintensity. Considerations include chronic small vessel ischemic disease, autoimmune, inflammatory or post-infectious etiologies.    2. Right thalamic chronic cerebral microhemorrhage vs dystrophic mineralization.  3. No acute findings.   PAST MEDICAL HISTORY: Past Medical History:  Diagnosis Date   Angioedema     Anxiety    Asthma    Depression    Diabetes mellitus, type II (HCC)    Dyslipidemia    Family history of adverse reaction to anesthesia    mother severely confused after general anesthesia   History of CVA (cerebrovascular accident)    HTN (hypertension) 2013   Panic attacks    Pneumonia    Septic embolism (HCC) 02/10/2021   Situational stress    Subacute endocarditis 02/10/2021    MEDICATIONS: Current Outpatient Medications on File Prior to Visit  Medication Sig Dispense Refill   atorvastatin (LIPITOR) 80 MG tablet TAKE 1 TABLET BY MOUTH EVERY DAY 90 tablet 3   carvedilol (COREG) 12.5 MG tablet TAKE 1.5 TABLETS (18.75 MG TOTAL) BY MOUTH EVERY MORNING AND 1 TABLET (12.5 MG TOTAL) EVERY EVENING. 225 tablet 3   carvedilol (COREG) 25 MG tablet Take 25 mg by mouth daily.     clopidogrel (PLAVIX) 75 MG tablet TAKE 1 TABLET BY MOUTH EVERY DAY 90 tablet 3   DULoxetine (CYMBALTA)  30 MG capsule Take 3 capsules (90 mg total) by mouth daily. 90 capsule 5   DULoxetine (CYMBALTA) 60 MG capsule Take 60 mg by mouth daily.     EPINEPHrine 0.3 mg/0.3 mL IJ SOAJ injection Inject 0.3 mg into the muscle daily as needed for anaphylaxis.     Insulin Pen Needle (PEN NEEDLES) 29G X MISC 1 each by Does not apply route daily. 30 each 2   L-Methylfolate-B6-B12 (FOLTANX) 3-35-2 MG TABS TAKE 1 TABLET BY MOUTH EVERY DAY 90 tablet 1   LANTUS SOLOSTAR 100 UNIT/ML Solostar Pen Inject 45 Units into the skin at bedtime.     pregabalin (LYRICA) 150 MG capsule Take 1 capsule (150 mg total) by mouth 3 (three) times daily. (Patient taking differently: Take 50 mg by mouth 3 (three) times daily. 1-3 times a day) 90 capsule 5   sacubitril-valsartan (ENTRESTO) 97-103 MG TAKE 1 TABLET BY MOUTH 2 (TWO) TIMES DAILY. PT WILL CALL FOR REFILL 180 tablet 2   traMADol (ULTRAM) 50 MG tablet TAKE 1 TABLET BY MOUTH 3 TIMES A DAY AS NEEDED FOR MODERATE PAIN. 90 tablet 2   No current facility-administered medications on file prior to  visit.    ALLERGIES: Allergies  Allergen Reactions   Sulfa Antibiotics Swelling   Aldactone [Spironolactone]     Inner ear infection    Dulaglutide     Other reaction(s): rash   Empagliflozin     Other reaction(s): recurrent yeast   Iodinated Contrast Media     Other reaction(s): angioedema with CT dye   Latex Rash    FAMILY HISTORY: Family History  Problem Relation Age of Onset   Hypertension Mother    Other Mother        blood disorder   Hypertension Father    Stroke Father    Diabetes Maternal Aunt    CVA Maternal Aunt       Objective:  Blood pressure (!) 159/87, pulse 99, height 5\' 6"  (1.676 m), weight 192 lb 6.4 oz (87.3 kg), last menstrual period 11/25/2011. General: No acute distress.  Patient appears well-groomed.   Head:  Normocephalic/atraumatic Eyes:  Fundi examined but not visualized Neck: supple, no paraspinal tenderness, full range of motion Heart:  Regular rate and rhythm Neurological Exam: alert and oriented.  Speech fluent and not dysarthric, language intact.  CN II-XII intact. Trace increased tone in left, muscle strength 5/5 throughout, reduced left finger-thumb tapping speed and amplitude.  Sensation to pinprick reduced in left lower extremity.  Vibratory sensation intact.  Deep tendon reflexes 2+ throughout.  Finger to nose testing with slight ataxia on left.  Gait cautious but steady.  Able to turn.  Able to tandem walk but unsteady.  Romberg with mild sway.    Shon Millet, DO  CC: Tally Joe, MD

## 2023-11-16 ENCOUNTER — Ambulatory Visit: Payer: 59 | Admitting: Neurology

## 2023-11-16 VITALS — BP 159/87 | HR 99 | Ht 66.0 in | Wt 192.4 lb

## 2023-11-16 DIAGNOSIS — G4733 Obstructive sleep apnea (adult) (pediatric): Secondary | ICD-10-CM

## 2023-11-16 DIAGNOSIS — G890 Central pain syndrome: Secondary | ICD-10-CM | POA: Diagnosis not present

## 2023-11-16 DIAGNOSIS — I10 Essential (primary) hypertension: Secondary | ICD-10-CM

## 2023-11-16 DIAGNOSIS — E1169 Type 2 diabetes mellitus with other specified complication: Secondary | ICD-10-CM

## 2023-11-16 DIAGNOSIS — M62838 Other muscle spasm: Secondary | ICD-10-CM

## 2023-11-16 DIAGNOSIS — I6381 Other cerebral infarction due to occlusion or stenosis of small artery: Secondary | ICD-10-CM

## 2023-11-16 DIAGNOSIS — E785 Hyperlipidemia, unspecified: Secondary | ICD-10-CM

## 2023-11-16 MED ORDER — BACLOFEN 10 MG PO TABS: 10.0000 mg | ORAL_TABLET | Freq: Three times a day (TID) | ORAL | 5 refills | Status: DC | PRN

## 2023-11-16 NOTE — Patient Instructions (Signed)
Refer to Sports Medicine for consideration of OMM Start baclofen 10mg  three times daily as needed for muscle spasms Continue pregablin and duloxetine, may take tramadol as needed Follow up 5 months.

## 2023-12-02 ENCOUNTER — Other Ambulatory Visit: Payer: Self-pay | Admitting: Neurology

## 2023-12-13 NOTE — Progress Notes (Signed)
 Sheila Guerrero D.[facility] [address] [place] [address] Phone: [phone]   Assessment and Plan:     1. Leg muscle spasm 2. History of stroke 3. Leg cramping 4. Weakness of left lower extremity  - Chronic with exacerbation, initial sports medicine visit - Left lower extremity cramping, spasticity, toe curling occurring over the past 1+ year with past medical history of right thalamic/capsular stroke in 2022.  I do believe that patient's left lower extremity symptoms fit the pattern of central nervous system injury with left lower extremity muscles decreased in size, though hypertonic compared to right, hypersensitivity to left lower extremity - Recommend long-term physical therapy/Occupational Therapy to prevent deconditioning and improve overall strength and balance - Agree with neurology recommendations to start baclofen  to decrease muscle spasticity.  Patient picked up medication, but has not started it due to fear of side effects.  She is agreeable to starting it at this time - Continue Lyrica , duloxetine  per neurology recommendations - Start HEP for quadriceps, hamstring, calf muscle.  Start physical therapy for the same.  External referral provided -Patient has been trialing bracing, though has not found one that is functional and comfortable.  May continue to trial different bracing, though I expect patient spasticity and toe curling will continue regardless of brace  Pertinent previous records reviewed include neurology note 11/16/2023  Follow Up: 4 to 6 weeks for reevaluation.  Could further discuss OMT   Subjective:   I, Moenique Parris, am serving as a Neurosurgeon for Doctor Ulysees Gander  Chief Complaint:  left leg spacticity   HPI:   12/14/2023 Patient is a 63 year old female with leg spacticity. Patient states 3 strokes with in the last couple of weeks . tightness in the left leg causing reduced mobility and affecting ability  to ambulate and get in and out of chairs. Has tried PT, massage and dry needling. Sometimes gets painful muscle spasms in the left leg, particularly at night in bed. States that her 3 toes curl under    Relevant Historical Information: History of CVA with left-side affected, hypertension, DM type II  Additional pertinent review of systems negative.   Current Outpatient Medications:    baclofen  (LIORESAL ) 10 MG tablet, Take 1 tablet (10 mg total) by mouth 3 (three) times daily as needed for muscle spasms., Disp: 30 each, Rfl: 5   carvedilol  (COREG ) 12.5 MG tablet, TAKE 1.5 TABLETS (18.75 MG TOTAL) BY MOUTH EVERY MORNING AND 1 TABLET (12.5 MG TOTAL) EVERY EVENING., Disp: 225 tablet, Rfl: 3   clopidogrel  (PLAVIX ) 75 MG tablet, TAKE 1 TABLET BY MOUTH EVERY DAY, Disp: 90 tablet, Rfl: 3   DULoxetine  (CYMBALTA ) 30 MG capsule, Take 3 capsules (90 mg total) by mouth daily., Disp: 90 capsule, Rfl: 5   EPINEPHrine  0.3 mg/0.3 mL IJ SOAJ injection, Inject 0.3 mg into the muscle daily as needed for anaphylaxis., Disp: , Rfl:    Insulin  Pen Needle (PEN NEEDLES) 29G X MISC, 1 each by Does not apply route daily., Disp: 30 each, Rfl: 2   L-Methylfolate-B6-B12 (FOLTANX) 3-35-2 MG TABS, TAKE 1 TABLET BY MOUTH EVERY DAY, Disp: 90 tablet, Rfl: 1   LANTUS  SOLOSTAR 100 UNIT/ML Solostar Pen, Inject 60 Units into the skin at bedtime., Disp: , Rfl:    pregabalin  (LYRICA ) 150 MG capsule, Take 1 capsule (150 mg total) by mouth 3 (three) times daily. (Patient taking differently: Take 50 mg by mouth 3 (three) times daily. 50 in morning  100 bedtime), Disp: 90 capsule, Rfl: 5   sacubitril -valsartan  (ENTRESTO ) 97-103 MG, TAKE 1 TABLET BY MOUTH 2 (TWO) TIMES DAILY. PT WILL CALL FOR REFILL, Disp: 180 tablet, Rfl: 2   traMADol  (ULTRAM ) 50 MG tablet, TAKE 1 TABLET BY MOUTH 3 TIMES A DAY AS NEEDED FOR MODERATE PAIN., Disp: 90 tablet, Rfl: 2   Objective:     Vitals:   12/14/23 1351  BP: 122/80  Pulse: (!) 115  SpO2: 98%   Weight: 192 lb (87.1 kg)  Height: 5\' 6"  (1.676 m)      Body mass index is 30.99 kg/m.    Physical Exam:    Gen: Appears well, nad, nontoxic and pleasant Psych: Alert and oriented, appropriate mood and affect Neuro: sensation intact, strength is 5/5 in upper and lower extremities, muscle tone wnl Skin: no susupicious lesions or rashes  Back - Normal skin, Spine with normal alignment and no deformity.   No tenderness to vertebral process palpation.   Paraspinous muscles are not tender and without spasm NTTP gluteal musculature Straight leg raise negative Trendelenberg negative   Left leg/hip: General muscle atrophy in left lower extremity compared to right, though left-sided musculature hypertonic compared to right Hip ROM Flexion 90, ext 30, IR 45, ER 45 Hypersensitivity to palpation over lateral thigh, lateral shin, foot on left compared to right Negative log roll with FROM Gait antalgic, favoring right leg   Electronically signed by:  Marshall Skeeter D.[facility] 2:33 PM 12/14/23

## 2023-12-14 ENCOUNTER — Ambulatory Visit: Payer: Medicare HMO | Admitting: Sports Medicine

## 2023-12-14 VITALS — BP 122/80 | HR 115 | Ht 66.0 in | Wt 192.0 lb

## 2023-12-14 DIAGNOSIS — Z8673 Personal history of transient ischemic attack (TIA), and cerebral infarction without residual deficits: Secondary | ICD-10-CM | POA: Diagnosis not present

## 2023-12-14 DIAGNOSIS — M62838 Other muscle spasm: Secondary | ICD-10-CM

## 2023-12-14 DIAGNOSIS — R252 Cramp and spasm: Secondary | ICD-10-CM

## 2023-12-14 DIAGNOSIS — R29898 Other symptoms and signs involving the musculoskeletal system: Secondary | ICD-10-CM

## 2023-12-14 NOTE — Patient Instructions (Addendum)
 Recommend starting baclofen   Leg HEP  Pt referral 4-6 week follow up

## 2023-12-25 NOTE — Progress Notes (Signed)
Cardiology Office Note   Date:  12/30/2023   ID:  Sheila Guerrero, Sheila Guerrero 11-Apr-1961, MRN [id number]  PCP:  Tally Joe, MD  Cardiologist:   Ulric Salzman [place], MD   Chief Complaint  Patient presents with   Congestive Heart Failure       History of Present Illness: Sheila Guerrero is a 63 y.o. female who is seen for follow up of CHF.  She has a history of HTN, DM type 2, HLD, and prior CVA in 2017. She was admitted in March 2022 with left sided paresthesias and progressive left sided weakness. She was admitted with acute CVA. MRI brain demonstrated-small acute to early subacute infarct in the right thalamus/posterior limb of the internal capsule, small subacute left occipital lobe infarct, moderately advanced chronic small vessel ischemic disease progressed from 2018 and numerous chronic microhemorrhages consistent with chronic hypertension. Carotid dopplers were negative. Echo showed EF 40-45% (new since 2018). Subsequent TEE reportedly showed multiple small vegetations on both MV leaflets with moderate MR and a ? larger sessile vegetation on the tricuspid valve. Patient had no fever or other source of infection. Multiple blood cultures were negative. She was treated for culture negative endocarditis with IV Rocephin and daptomycin for 6 weeks. DC'd on Entresto and beta blocker. She was readmitted in April with confusion and difficulty speaking and walking. MRI showed subacute left thalamic infarct. CT head negative. EEG was normal. TEE was repeated on April 29 showing EF 45-50%. Reportedly smaller vegetation on medial MV leaflet and tricuspid valve. ? Small vegetation on noncoronary AV cusp. Mild MR and TR. I also reviewed her Echo and TEE images. I  did no feel that there was convincing evidence of vegetations. She has some fine filamentous strands noted on the MV of uncertain significance.  Given lack of any clinical evidence of infection and normal blood cultures I think the weight of  evidence is that she never had endocarditis.  She is on Plavix monotherapy  She had follow up Echo in April 2022 and June 2023 showing normal LV function. EF 60-65%.   On follow up today she is seen with her husband. She has been diagnosed with severe sleep apnea and is now on CPAP. She was experiencing diarrhea and after changing a number of medications this seemed to resolved after stopping Entresto. She denies any SOB or edema. No chest pain. Feels well.    Past Medical History:  Diagnosis Date   Angioedema    Anxiety    Asthma    Depression    Diabetes mellitus, type II (HCC)    Dyslipidemia    Family history of adverse reaction to anesthesia    mother severely confused after general anesthesia   History of CVA (cerebrovascular accident)    HTN (hypertension) 2013   Panic attacks    Pneumonia    Septic embolism (HCC) 02/10/2021   Situational stress    Subacute endocarditis 02/10/2021    Past Surgical History:  Procedure Laterality Date   BUBBLE STUDY  02/10/2021   Procedure: BUBBLE STUDY;  Surgeon: Orpah Cobb, MD;  Location: [facility] ENDOSCOPY;  Service: Cardiovascular;;   CHOLECYSTECTOMY  2010   TEE WITHOUT CARDIOVERSION N/A 02/10/2021   Procedure: TRANSESOPHAGEAL ECHOCARDIOGRAM (TEE);  Surgeon: Orpah Cobb, MD;  Location: [facility] ENDOSCOPY;  Service: Cardiovascular;  Laterality: N/A;   TEE WITHOUT CARDIOVERSION N/A 03/27/2021   Procedure: TRANSESOPHAGEAL ECHOCARDIOGRAM (TEE);  Surgeon: Orpah Cobb, MD;  Location: [facility] ENDOSCOPY;  Service: Cardiovascular;  Laterality: N/A;  Current Outpatient Medications  Medication Sig Dispense Refill   baclofen (LIORESAL) 10 MG tablet Take 1 tablet (10 mg total) by mouth 3 (three) times daily as needed for muscle spasms. 30 each 5   baclofen (LIORESAL) 10 MG tablet Take 10 mg by mouth 2 (two) times daily. Patient takes one tablet and half twice a day.     carvedilol (COREG) 12.5 MG tablet TAKE 1.5 TABLETS (18.75 MG TOTAL) BY MOUTH EVERY  MORNING AND 1 TABLET (12.5 MG TOTAL) EVERY EVENING. 225 tablet 3   clopidogrel (PLAVIX) 75 MG tablet TAKE 1 TABLET BY MOUTH EVERY DAY 90 tablet 3   DULoxetine (CYMBALTA) 30 MG capsule Take 3 capsules (90 mg total) by mouth daily. 90 capsule 5   EPINEPHrine 0.3 mg/0.3 mL IJ SOAJ injection Inject 0.3 mg into the muscle daily as needed for anaphylaxis.     Insulin Pen Needle (PEN NEEDLES) 29G X MISC 1 each by Does not apply route daily. 30 each 2   L-Methylfolate-B6-B12 (FOLTANX) 3-35-2 MG TABS TAKE 1 TABLET BY MOUTH EVERY DAY 90 tablet 1   LANTUS SOLOSTAR 100 UNIT/ML Solostar Pen Inject 60 Units into the skin at bedtime.     pregabalin (LYRICA) 150 MG capsule Take 1 capsule (150 mg total) by mouth 3 (three) times daily. (Patient taking differently: Take 50 mg by mouth 3 (three) times daily. 50 in morning 100 bedtime) 90 capsule 5   traMADol (ULTRAM) 50 MG tablet TAKE 1 TABLET BY MOUTH 3 TIMES A DAY AS NEEDED FOR MODERATE PAIN. 90 tablet 2   No current facility-administered medications for this visit.    Allergies:   Sulfa antibiotics, Aldactone [spironolactone], Dulaglutide, Empagliflozin, Iodinated contrast media, and Latex    Social History:  The patient  reports that she has never smoked. She has never used smokeless tobacco. She reports current alcohol use. She reports that she does not use drugs.   Family History:  The patient's family history includes CVA in her maternal aunt; Diabetes in her maternal aunt; Hypertension in her father and mother; Other in her mother; Stroke in her father.    ROS:  Please see the history of present illness.   Otherwise, review of systems are positive for none.   All other systems are reviewed and negative.    PHYSICAL EXAM: VS:  BP (!) 140/82 (BP Location: Right Arm, Patient Position: Sitting, Cuff Size: Normal)   Pulse (!) 104   Ht 5\' 5"  (1.651 m)   Wt 189 lb 6.4 oz (85.9 kg)   LMP 11/25/2011   SpO2 95%   BMI 31.52 kg/m  , BMI Body mass index  is 31.52 kg/m. GEN: Well nourished, well developed, in no acute distress  HEENT: normal  Neck: no JVD, carotid bruits, or masses Cardiac: RRR; no murmurs, rubs, or gallops,no edema  Respiratory:  clear to auscultation bilaterally, normal work of breathing GI: soft, nontender, nondistended, + BS MS: no deformity or atrophy  Skin: warm and dry, no rash Neuro:  Strength and sensation are intact Psych: euthymic mood, full affect   EKG:  EKG is  not ordered today.      Recent Labs: No results found for requested labs within last 365 days.    Lipid Panel    Component Value Date/Time   CHOL 98 (L) 03/22/2022 1038   TRIG 78 03/22/2022 1038   HDL 44 03/22/2022 1038   CHOLHDL 2.2 03/22/2022 1038   CHOLHDL 8.4 03/13/2021 0445   VLDL 77 (  H) 03/13/2021 0445   LDLCALC 38 03/22/2022 1038    Dated 05/12/22: glucose 130, otherwise CMET normal. Cholesterol 112, triglycerides 114, HDL 37, LDL 55. A1c 6.5%. Dated 10/03/23: cholesterol 154, triglycerides 231, HDL 40, LDL 76.  Dated 12/23/23: A1c 10.4. CMET and TSH normal  Wt Readings from Last 3 Encounters:  12/30/23 189 lb 6.4 oz (85.9 kg)  12/14/23 192 lb (87.1 kg)  11/16/23 192 lb 6.4 oz (87.3 kg)      Other studies Reviewed: Additional studies/ records that were reviewed today include:    Echo 03/23/22: IMPRESSIONS     1. Left ventricular ejection fraction, by estimation, is 60 to 65%. The  left ventricle has normal function. The left ventricle has no regional  wall motion abnormalities. There is moderate left ventricular hypertrophy.  Left ventricular diastolic  parameters were normal.   2. Right ventricular systolic function is normal. The right ventricular  size is normal. Tricuspid regurgitation signal is inadequate for assessing  PA pressure.   3. The mitral valve is normal in structure. Trivial mitral valve  regurgitation.   4. The aortic valve was not well visualized. Aortic valve regurgitation  is not visualized. No  aortic stenosis is present.   5. The inferior vena cava is normal in size with greater than 50%  respiratory variability, suggesting right atrial pressure of 3 mmHg.   Echo 6/624: IMPRESSIONS     1. Left ventricular ejection fraction, by estimation, is 65 to 70%. The  left ventricle has normal function. The left ventricle has no regional  wall motion abnormalities. There is moderate concentric left ventricular  hypertrophy. Left ventricular  diastolic parameters are consistent with Grade I diastolic dysfunction  (impaired relaxation).   2. Right ventricular systolic function is normal. The right ventricular  size is normal. Tricuspid regurgitation signal is inadequate for assessing  PA pressure.   3. The mitral valve is grossly normal. Trivial mitral valve  regurgitation.   4. The aortic valve is tricuspid. Aortic valve regurgitation is not  visualized.   5. The inferior vena cava is normal in size with greater than 50%  respiratory variability, suggesting right atrial pressure of 3 mmHg.   Comparison(s): Changes from prior study are noted. 03/23/2022: LVEF 60-65%.    ASSESSMENT AND PLAN:  1.  Recurrent CVA. This appears to be related to small vessel cerebrovascular disease related to HTN, DM, and HLD. Will continue Plavix.   2. Chronic systolic CHF. EF initially reduced at 45-50%. On Entresto and Coreg EF returned to normal. Clinically she is doing well but concerned about stopping Entresto. Will update Echo now. If EF normal will not change. If low again would start ARB (due to diarrhea on Entresto and cost).good option.  3. HTN.   4. Hypercholesterolemia. Per PCP  5. DM per primary care. A1c good at 10.3%. per PCP    Signed, Kanoa Phillippi [place], MD  12/30/2023 4:09 PM    [facility] [address] [place], [place], [address] Phone [phone], Fax [phone]

## 2023-12-30 ENCOUNTER — Ambulatory Visit: Payer: Medicare HMO | Attending: Cardiology | Admitting: Cardiology

## 2023-12-30 ENCOUNTER — Encounter: Payer: Self-pay | Admitting: Cardiology

## 2023-12-30 VITALS — BP 140/82 | HR 104 | Ht 65.0 in | Wt 189.4 lb

## 2023-12-30 DIAGNOSIS — I6350 Cerebral infarction due to unspecified occlusion or stenosis of unspecified cerebral artery: Secondary | ICD-10-CM

## 2023-12-30 DIAGNOSIS — I10 Essential (primary) hypertension: Secondary | ICD-10-CM

## 2023-12-30 DIAGNOSIS — I5022 Chronic systolic (congestive) heart failure: Secondary | ICD-10-CM

## 2023-12-30 NOTE — Patient Instructions (Signed)
Medication Instructions:  Continue same medications *If you need a refill on your cardiac medications before your next appointment, please call your pharmacy*   Lab Work: None ordered   Testing/Procedures: Echo  first available   Follow-Up: At [facility], you and your health needs are our priority.  As part of our continuing mission to provide you with exceptional heart care, we have created designated Provider Care Teams.  These Care Teams include your primary Cardiologist (physician) and Advanced Practice Providers (APPs -  Physician Assistants and Nurse Practitioners) who all work together to provide you with the care you need, when you need it.  We recommend signing up for the patient portal called "MyChart".  Sign up information is provided on this After Visit Summary.  MyChart is used to connect with patients for Virtual Visits (Telemedicine).  Patients are able to view lab/test results, encounter notes, upcoming appointments, etc.  Non-urgent messages can be sent to your provider as well.   To learn more about what you can do with MyChart, go to [email or web address].    Your next appointment: 1 year   Call in Oct to schedule Jan appointment      Provider:  Dr.Jordan

## 2024-01-10 ENCOUNTER — Telehealth: Payer: Self-pay

## 2024-01-10 NOTE — Telephone Encounter (Signed)
PA needed for Duloxetine 30 mg

## 2024-01-10 NOTE — Progress Notes (Unsigned)
    Sheila Guerrero D.[facility] [address] [place] [address] Phone: [phone]   Assessment and Plan:     There are no diagnoses linked to this encounter.  ***   Pertinent previous records reviewed include ***    Follow Up: ***     Subjective:   I, Sheila Guerrero, am serving as a Neurosurgeon for Doctor Richardean Sale   Chief Complaint:  left leg spacticity    HPI:    12/14/2023 Patient is a 63 year old female with leg spacticity. Patient states 3 strokes with in the last couple of weeks . tightness in the left leg causing reduced mobility and affecting ability to ambulate and get in and out of chairs. Has tried PT, massage and dry needling. Sometimes gets painful muscle spasms in the left leg, particularly at night in bed. States that her 3 toes curl under    01/11/2024 Patient states   Relevant Historical Information: History of CVA with left-side affected, hypertension, DM type II  Additional pertinent review of systems negative.   Current Outpatient Medications:    baclofen (LIORESAL) 10 MG tablet, Take 1 tablet (10 mg total) by mouth 3 (three) times daily as needed for muscle spasms., Disp: 30 each, Rfl: 5   baclofen (LIORESAL) 10 MG tablet, Take 10 mg by mouth 2 (two) times daily. Patient takes one tablet and half twice a day., Disp: , Rfl:    carvedilol (COREG) 12.5 MG tablet, TAKE 1.5 TABLETS (18.75 MG TOTAL) BY MOUTH EVERY MORNING AND 1 TABLET (12.5 MG TOTAL) EVERY EVENING., Disp: 225 tablet, Rfl: 3   clopidogrel (PLAVIX) 75 MG tablet, TAKE 1 TABLET BY MOUTH EVERY DAY, Disp: 90 tablet, Rfl: 3   DULoxetine (CYMBALTA) 30 MG capsule, Take 3 capsules (90 mg total) by mouth daily., Disp: 90 capsule, Rfl: 5   EPINEPHrine 0.3 mg/0.3 mL IJ SOAJ injection, Inject 0.3 mg into the muscle daily as needed for anaphylaxis., Disp: , Rfl:    Insulin Pen Needle (PEN NEEDLES) 29G X MISC, 1 each by Does not apply route daily., Disp: 30 each,  Rfl: 2   L-Methylfolate-B6-B12 (FOLTANX) 3-35-2 MG TABS, TAKE 1 TABLET BY MOUTH EVERY DAY, Disp: 90 tablet, Rfl: 1   LANTUS SOLOSTAR 100 UNIT/ML Solostar Pen, Inject 60 Units into the skin at bedtime., Disp: , Rfl:    pregabalin (LYRICA) 150 MG capsule, Take 1 capsule (150 mg total) by mouth 3 (three) times daily. (Patient taking differently: Take 50 mg by mouth 3 (three) times daily. 50 in morning 100 bedtime), Disp: 90 capsule, Rfl: 5   traMADol (ULTRAM) 50 MG tablet, TAKE 1 TABLET BY MOUTH 3 TIMES A DAY AS NEEDED FOR MODERATE PAIN., Disp: 90 tablet, Rfl: 2   Objective:     There were no vitals filed for this visit.    There is no height or weight on file to calculate BMI.    Physical Exam:    ***   Electronically signed by:  Sheila Guerrero D.[facility] 7:30 AM 01/10/24

## 2024-01-11 ENCOUNTER — Ambulatory Visit: Payer: Medicare HMO | Admitting: Sports Medicine

## 2024-01-11 ENCOUNTER — Ambulatory Visit (INDEPENDENT_AMBULATORY_CARE_PROVIDER_SITE_OTHER): Payer: Medicare HMO

## 2024-01-11 VITALS — HR 111 | Ht 65.0 in | Wt 180.0 lb

## 2024-01-11 DIAGNOSIS — R29898 Other symptoms and signs involving the musculoskeletal system: Secondary | ICD-10-CM

## 2024-01-11 DIAGNOSIS — R252 Cramp and spasm: Secondary | ICD-10-CM

## 2024-01-11 DIAGNOSIS — Z8673 Personal history of transient ischemic attack (TIA), and cerebral infarction without residual deficits: Secondary | ICD-10-CM

## 2024-01-11 DIAGNOSIS — M62838 Other muscle spasm: Secondary | ICD-10-CM

## 2024-01-11 DIAGNOSIS — M25561 Pain in right knee: Secondary | ICD-10-CM

## 2024-01-11 NOTE — Patient Instructions (Addendum)
Right knee xray  Continue HEP  Start with silver sneakers Contact us if you would like physical therapy  Continue working with neurology to optimize medications As needed follow up

## 2024-01-22 ENCOUNTER — Other Ambulatory Visit: Payer: Self-pay | Admitting: Neurology

## 2024-01-26 ENCOUNTER — Encounter: Payer: Self-pay | Admitting: Sports Medicine

## 2024-02-06 ENCOUNTER — Ambulatory Visit (HOSPITAL_COMMUNITY): Admission: RE | Admit: 2024-02-06 | Payer: Medicare HMO | Source: Ambulatory Visit

## 2024-03-09 ENCOUNTER — Ambulatory Visit (HOSPITAL_COMMUNITY): Attending: Cardiology

## 2024-03-22 ENCOUNTER — Other Ambulatory Visit: Payer: Self-pay | Admitting: Cardiology

## 2024-04-12 ENCOUNTER — Telehealth: Payer: Self-pay | Admitting: Neurology

## 2024-04-12 NOTE — Telephone Encounter (Signed)
 Patient called to see if Dr.Jaffe will call her in baclofen  5mg . Her prescribed her 10mg  but she doesn't want to cut the pills in half. She is only taking 5mg .   [facility] [address]

## 2024-04-13 ENCOUNTER — Other Ambulatory Visit: Payer: Self-pay | Admitting: Neurology

## 2024-04-13 MED ORDER — BACLOFEN 5 MG PO TABS: 1.0000 | ORAL_TABLET | Freq: Three times a day (TID) | ORAL | 5 refills | Status: AC

## 2024-04-13 MED ORDER — BACLOFEN 5 MG PO TABS: 1.0000 | ORAL_TABLET | Freq: Two times a day (BID) | ORAL | 5 refills | Status: DC

## 2024-04-13 NOTE — Telephone Encounter (Signed)
 Patient advised of refills.

## 2024-04-13 NOTE — Telephone Encounter (Signed)
 Telephone call to patient to ask, Please verify specific dosing.  - is she taking 5mg  three times daily? Per Dr.Jaffe.  Per patient she is taking 5 mg TID.

## 2024-05-04 NOTE — Progress Notes (Signed)
 NEUROLOGY FOLLOW UP OFFICE NOTE  [name] [phone]  Assessment/Plan:   Thalamic pain syndrome Obstructive sleep apnea History of right thalamic stroke, likely secondary to small vessel disease Type 2 diabetes mellitus Hypertension Hyperlipidemia      For thalamic pain:             - Cymbalta  90mg  daily (also for depression) - Lyrica  50mg  QD-TID (as prescribed by her PCP)             - Tramadol  50mg  1/2-1 tablet one to two times (has not needed in awhile) 2  CPAP for treatment of OSA, as per sleep medicine  3  Metanx  4  Secondary stroke prevention as managed by PCP/cardiology:             - Plavix  75mg  daliy             - Statin therapy.  LDL goal less than 70             - Glycemic control. Hgb a1c goal less than 7             - Normotensive blood pressure 4  For treatment of left lower extremity muscle spasms and tightness:  - Baclofen  5mg  TID as needed  - Sees Dr. Cleora Daft of [facility] 5  Follow up with PCP regarding BP 6  Follow up 5 months.       Subjective:  Sheila Guerrero is a 63 year old right-handed female with CHF, HTN, HLD, DM II, asthma, OSA, recurrent angioedema and anxiety with panic attacks who follows up for thalamic pain syndrome.    UPDATE: Lyrica  50mg  in AM and 100mg  PM (higher doses increased somnolence), Cymbalta  90mg  daily (also for depression) and tramadol  50mg  QD-BID PRN (has not been taking), baclofen  5mg .   Baclofen  has helped with the leg spasms.  Ambulating better. Thalamic pain manageable with pregablin and duloxetine . Still feels dizzy.   Using CPAP.  Sleeping better with pain control.     HISTORY:  Patient reportedly had a stroke in 2017-2018 that reportedly presented with confusion.  She later saw a neurologist.  MRI of brain personally reviewed on 03/06/2017 showed chronic left frontal subcortical lacunar infact and right thalamic chronic cerebral microhemorrhage vs dystrophic mineralization.  She was started on  ASA. She has not worked since this stroke.     In February 2022, she develop left sided upper and lower weakness and numbness.  She was treated for probable pinched nerve.  That march, she went to the ED due to worsening low back pain and was found to have a right thalamic/capsular stroke.  MRA of head and neck showed severe right MCA stenosis and severe distal right V4 stenosis.  Carotid ultrasound unremarkable.  TEE revealed vegetation on the mitral and tricuspid valves.  Evaluated by ID.  Although blood cultures were negative decision was made to treat with antibiotics.  She was discharged on ASA and Plavix  DAPT followed by Plavix  monotherapy.  In April 2022, she was readmitted to the hospital with confusion, speech disturbance and gait instability.  MRI of brain showed subacute left thalamic infarct.  EEG was normal.  Repeat TEE showed EF 45-50% with smaller vegetation on the mitral and tricuspid valves.  She followed up with cardiology who did not believe findings on TEE were vegetation but rather fine filamentous strands of uncertain significance.  Ultimately, her strokes were believed to be due to small vessel disease.  She was found  by her previous neurologist to have a methylenetetrahydrofolate reductase deficiency and was started on Metanx   Following the stroke, she developed a thalamic pain syndrome. Did not tolerate gabapentin.  She takes Lyrica  150mg  TID, Cymbalta  60mg  daily (also for depression) and tramadol  50mg  BID PRN.  Regimen is effective but causes drowsiness.  Once she had hallucinations (seeing things that quickly moved across her vision).  Therefore, she has cut back on dosing of Lyrica  and tramadol  to twice daily, which has helped alertness but notes some increased pain.      03/13/2021 MRI BRAIN W WO/MRA HEAD & NECK:  1. Acute to early subacute right and likely late subacute left thalamic infarcts. No mass effect or acute hemorrhage. 2. Severe stenosis of the distal right MCA M1  segment. 3. Severe stenosis of the distal right V4 segment. 4. No stenosis of the carotid or vertebral arteries in the neck.  02/09/2021 MRI BRAIN:  1. Patchy post-contrast enhancement about the recently identified right thalamocapsular and left occipital infarcts, in keeping with early subacute ischemic infarcts. No associated mass effect.  2. Underlying age-related cerebral atrophy with moderately advanced chronic microvascular ischemic disease. No other new intracranial abnormality on this limited postcontrast only exam. 02/07/2021 MRI BRAIN:  1. Small acute to early subacute infarct in the right thalamus/posterior limb of internal capsule. 2. Small subacute left occipital lobe infarct. 3. Moderately advanced chronic small vessel ischemic disease, progressed from 2018. 4. Numerous chronic microhemorrhages consistent with chronic hypertension. 02/07/2021 MRA HEAD:  1. Intracranial atherosclerosis including severe right M1 and moderate right V4 stenoses. 2. No large vessel occlusion. 03/02/2017 MRI BRAIN:  1. Round and ovoid periventricular and subcortical foci of T2 hyperintensites. Left frontal subcortical cystic gliosis vs chronic lacunar infarction. Left pontine T2 hyperintensity. Considerations include chronic small vessel ischemic disease, autoimmune, inflammatory or post-infectious etiologies.    2. Right thalamic chronic cerebral microhemorrhage vs dystrophic mineralization.  3. No acute findings.   PAST MEDICAL HISTORY: Past Medical History:  Diagnosis Date   Angioedema    Anxiety    Asthma    Depression    Diabetes mellitus, type II (HCC)    Dyslipidemia    Family history of adverse reaction to anesthesia    mother severely confused after general anesthesia   History of CVA (cerebrovascular accident)    HTN (hypertension) 2013   Panic attacks    Pneumonia    Septic embolism (HCC) 02/10/2021   Situational stress    Subacute endocarditis 02/10/2021    MEDICATIONS: Current  Outpatient Medications on File Prior to Visit  Medication Sig Dispense Refill   Baclofen  5 MG TABS Take 1 tablet (5 mg total) by mouth 3 (three) times daily. 90 tablet 5   carvedilol  (COREG ) 12.5 MG tablet TAKE 1.5 TABLETS (18.75 MG TOTAL) BY MOUTH EVERY MORNING AND 1 TABLET (12.5 MG TOTAL) EVERY EVENING. 225 tablet 3   clopidogrel  (PLAVIX ) 75 MG tablet TAKE 1 TABLET BY MOUTH EVERY DAY 90 tablet 3   DULoxetine  (CYMBALTA ) 30 MG capsule TAKE 3 CAPSULES BY MOUTH EVERY DAY 90 capsule 5   EPINEPHrine  0.3 mg/0.3 mL IJ SOAJ injection Inject 0.3 mg into the muscle daily as needed for anaphylaxis.     Insulin  Pen Needle (PEN NEEDLES) 29G X MISC 1 each by Does not apply route daily. 30 each 2   L-Methylfolate-B6-B12 (FOLTANX) 3-35-2 MG TABS TAKE 1 TABLET BY MOUTH EVERY DAY 90 tablet 1   LANTUS  SOLOSTAR 100 UNIT/ML Solostar Pen Inject 60  Units into the skin at bedtime.     pregabalin  (LYRICA ) 150 MG capsule Take 1 capsule (150 mg total) by mouth 3 (three) times daily. (Patient taking differently: Take 50 mg by mouth 3 (three) times daily. 50 in morning 100 bedtime) 90 capsule 5   traMADol  (ULTRAM ) 50 MG tablet TAKE 1 TABLET BY MOUTH 3 TIMES A DAY AS NEEDED FOR MODERATE PAIN. 90 tablet 2   No current facility-administered medications on file prior to visit.    ALLERGIES: Allergies  Allergen Reactions   Sulfa Antibiotics Swelling   Aldactone  [Spironolactone ]     Inner ear infection    Dulaglutide     Other reaction(s): rash   Empagliflozin     Other reaction(s): recurrent yeast   Iodinated Contrast Media     Other reaction(s): angioedema with CT dye   Latex Rash    FAMILY HISTORY: Family History  Problem Relation Age of Onset   Hypertension Mother    Other Mother        blood disorder   Hypertension Father    Stroke Father    Diabetes Maternal Aunt    CVA Maternal Aunt       Objective:  Blood pressure (!) 150/86, pulse 94, height 5\' 5"  (1.651 m), weight 188 lb (85.3 kg), last  menstrual period 11/25/2011, SpO2 95%. General: No acute distress.  Patient appears well-groomed.       Janne Members, DO  CC: Rae Bugler, MD

## 2024-05-07 ENCOUNTER — Ambulatory Visit: Payer: 59 | Admitting: Neurology

## 2024-05-07 ENCOUNTER — Encounter: Payer: Self-pay | Admitting: Neurology

## 2024-05-07 VITALS — BP 150/86 | HR 94 | Ht 65.0 in | Wt 188.0 lb

## 2024-05-07 DIAGNOSIS — G4733 Obstructive sleep apnea (adult) (pediatric): Secondary | ICD-10-CM

## 2024-05-07 DIAGNOSIS — M62838 Other muscle spasm: Secondary | ICD-10-CM | POA: Diagnosis not present

## 2024-05-07 DIAGNOSIS — G890 Central pain syndrome: Secondary | ICD-10-CM | POA: Diagnosis not present

## 2024-05-07 NOTE — Patient Instructions (Signed)
 Duloxetine  90mg  daily Baclofen 

## 2024-05-13 ENCOUNTER — Other Ambulatory Visit: Payer: Self-pay | Admitting: Neurology

## 2024-05-21 ENCOUNTER — Ambulatory Visit (HOSPITAL_COMMUNITY)
Admission: RE | Admit: 2024-05-21 | Discharge: 2024-05-21 | Disposition: A | Discharge status: Home / Self Care | Source: Ambulatory Visit | Attending: Cardiology | Admitting: Cardiology

## 2024-05-21 DIAGNOSIS — I5022 Chronic systolic (congestive) heart failure: Secondary | ICD-10-CM

## 2024-05-21 LAB — ECHOCARDIOGRAM COMPLETE
Area-P 1/2: 3.91 cm2
S' Lateral: 3.1 cm

## 2024-05-22 ENCOUNTER — Ambulatory Visit: Payer: Self-pay | Admitting: Cardiology

## 2024-06-10 ENCOUNTER — Other Ambulatory Visit: Payer: Self-pay | Admitting: Cardiology

## 2024-08-15 ENCOUNTER — Telehealth: Payer: Self-pay | Admitting: Neurology

## 2024-08-15 NOTE — Telephone Encounter (Signed)
 Patient saw her Endocrinologist and her B12 levels are high. She wants to know if she can take her meds. She didn't leave the name of anything

## 2024-08-17 NOTE — Telephone Encounter (Signed)
 Called patient and informed her of Dr. Jayme recommendation. She stated her previous neurologist put her on the supplement L-Methylfolate-B6-B12 (FOLTANX) tablet  due to her enzyme deficiently. She wanted to ensure Dr. Skeet was aware of this. Informed patient I will send this message to Dr. Skeet and patient will be contacted back.

## 2024-08-18 ENCOUNTER — Other Ambulatory Visit: Payer: Self-pay | Admitting: Cardiology

## 2024-08-20 NOTE — Telephone Encounter (Signed)
 Called patient and informed her that Dr. Skeet would like have her labs laxed over to us  for review. Patient was provided our fax number and will get those sent over for Dr. Jayme review.

## 2024-08-23 ENCOUNTER — Telehealth: Payer: Self-pay | Admitting: Cardiology

## 2024-08-23 MED ORDER — VALSARTAN 160 MG PO TABS: 160.0000 mg | ORAL_TABLET | Freq: Every day | ORAL | 3 refills | Status: DC

## 2024-08-23 NOTE — Telephone Encounter (Addendum)
 Returned call to # listed which was husband -- not Brooke NP.  Husband reports HR 95-100bpm - baseline per husband Brooke NP did not want to prescribe med, wanted cardiology input.   Per chart review, has HFpEF with echo 04/2024. Previously on entresto  but not currently d/t cost and diarrhea.  Could consider ARB? Labs were done today, do not have results. Last metabolic panel in epic from 2023.   Routed to DOD - Dr. Michele - and Dr. [place] (primary cardiologist)

## 2024-08-23 NOTE — Telephone Encounter (Signed)
 [place], Peter M, MD to Sheila Guerrero Triage (Selected Message)     08/23/24  1:53 PM Renal function in the past. I would go ahead and start valsartan  160 mg daily.    Peter [place] MD, [facility]  S/w the patient and gave the information above. RX sent to preferred pharmacy. She verbalized understanding.

## 2024-08-23 NOTE — Telephone Encounter (Signed)
 Called patient and informed her of Dr. Jayme response and recommendations per below. Patient verbalized understanding and had no further questions or concerns.

## 2024-08-23 NOTE — Telephone Encounter (Signed)
 Spoke with pt who stated she has consistently had BP readings in the 180s/110s the last few days and they have not decreased. Advised pt that she should go to the ED to be evaluated due to the readings staying that high and having the risk of stroke with elevated numbers like that. Pt verbalized understanding of plan and stated she would go to get evaluated.

## 2024-08-23 NOTE — Telephone Encounter (Signed)
 Thanks for the update - thread reviewed.  Dr. Gib recommendations noted.   ST

## 2024-08-23 NOTE — Addendum Note (Signed)
 Addended by: Abijah Roussel L on: 08/23/2024 02:08 PM   Modules accepted: Orders

## 2024-08-23 NOTE — Telephone Encounter (Signed)
 Call placed to patient's husband.  Mailbox is full.  Unable to leave a message

## 2024-08-23 NOTE — Telephone Encounter (Signed)
 Husband Sherl) wants a call back to discuss next steps regarding patient's high BP readings.

## 2024-08-23 NOTE — Telephone Encounter (Signed)
 Pt c/o BP issue: STAT if pt c/o blurred vision, one-sided weakness or slurred speech.  STAT if BP is GREATER than 180/120 TODAY.  STAT if BP is LESS than 90/60 and SYMPTOMATIC TODAY  1. What is your BP concern? BP has been high last few days  2. Have you taken any BP medication today? No  3. What are your last 5 BP readings? Around 180/111 last few days  4. Are you having any other symptoms (ex. Dizziness, headache, blurred vision, passed out)? No

## 2024-08-23 NOTE — Telephone Encounter (Signed)
 Sheila Bertrand, NP, following up. She reports a BP reading of 162/88 with manual cuff. NP would like to discuss adjusting medications. She says they took a metabolic and cholesterol panel--awaiting results. Patient discussed not wanting to go to the ED. NP Advised patient of strict ER precautions given history of 4 prior strokes. NP says a call may be returned to the patient with recommendations + can call her to review medication changes.

## 2024-08-23 NOTE — Telephone Encounter (Addendum)
 I spoke with patient's husband. Patient is currently seeing PCP and husband is in the office with patient.  I let him know if PCP had any questions they could contact doctor in the office today.  I asked husband to have PCP send office note to Dr [place] once patient's visit is completed today.   Husband will call to schedule appointment based on evaluation today at PCP office.

## 2024-08-24 NOTE — Telephone Encounter (Signed)
 Spoke to patient she stated her B/P is better after taking Valsartan .B/P this morning 136/79.She will call back next month to schedule Jan follow up appointment with Dr.Jordan.She will call sooner if needed.

## 2024-08-27 ENCOUNTER — Telehealth: Payer: Self-pay

## 2024-08-27 NOTE — Telephone Encounter (Signed)
 Received call from patient calling to report B/P still elevated after starting Valsartan  160 mg last Thurs 9/25.Stated she increased to 320 mg this morning.B/P when she woke up 207/118. 2 hours after taking Valsartan  320 mg 145/92.She requested appointment with Dr.Jordan.Appointment scheduled 10/8 at 2:20 pm.She will continue Valsartan  320 mg daily.Monitor B/P daily and bring readings to appointment.She had lipid panel done with PCP and LDL cholesterol elevated.She restarted Lipitor  80 mg this morning.PCP to send results.Dr.Jordan is out of office.I will make him aware.

## 2024-09-04 NOTE — Progress Notes (Unsigned)
 Cardiology Office Note   Date:  09/05/2024   ID:  Sheila Guerrero, DOB 1961-11-10, MRN [id number]  PCP:  Seabron Lenis, MD  Cardiologist:   Zion Ta [place], MD   Chief Complaint  Patient presents with   Hypertension   Congestive Heart Failure       History of Present Illness: Sheila Guerrero is a 63 y.o. female who is seen for follow up of CHF and HTN.  She has a history of HTN, DM type 2, HLD, and prior CVA in 2017. She was admitted in March 2022 with left sided paresthesias and progressive left sided weakness. She was admitted with acute CVA. MRI brain demonstrated-small acute to early subacute infarct in the right thalamus/posterior limb of the internal capsule, small subacute left occipital lobe infarct, moderately advanced chronic small vessel ischemic disease progressed from 2018 and numerous chronic microhemorrhages consistent with chronic hypertension. Carotid dopplers were negative. Echo showed EF 40-45% (new since 2018). Subsequent TEE reportedly showed multiple small vegetations on both MV leaflets with moderate MR and a ? larger sessile vegetation on the tricuspid valve. Patient had no fever or other source of infection. Multiple blood cultures were negative. She was treated for culture negative endocarditis with IV Rocephin  and daptomycin  for 6 weeks. DC'd on Entresto  and beta blocker. She was readmitted in April with confusion and difficulty speaking and walking. MRI showed subacute left thalamic infarct. CT head negative. EEG was normal. TEE was repeated on April 29 showing EF 45-50%. Reportedly smaller vegetation on medial MV leaflet and tricuspid valve. ? Small vegetation on noncoronary AV cusp. Mild MR and TR. I also reviewed her Echo and TEE images. I  did no feel that there was convincing evidence of vegetations. She has some fine filamentous strands noted on the MV of uncertain significance.  Given lack of any clinical evidence of infection and normal blood cultures  I think the weight of evidence is that she never had endocarditis.  She is on Plavix  monotherapy  She had follow up Echo in April 2022 and June 2023 showing normal LV function. EF 60-65%. Repeat Echo this June showed normal LV function  On follow up today she is seen with her husband. Over the past 2-3 months she has noted significant increase in BP readings. Was started on valsartan  and titrated to 320 mg daily but BP still poorly controlled. Readings 145-200 systolic. She feels fine except is fatigued. No chest pain, dyspnea, edema, palpitations or new Neurologic symptoms. She also had bad diarrhea on lipitor  and this was stopped. Tried again and symptoms recurred.    Past Medical History:  Diagnosis Date   Angioedema    Anxiety    Asthma    Depression    Diabetes mellitus, type II (HCC)    Dyslipidemia    Family history of adverse reaction to anesthesia    mother severely confused after general anesthesia   History of CVA (cerebrovascular accident)    HTN (hypertension) 2013   Panic attacks    Pneumonia    Septic embolism (HCC) 02/10/2021   Situational stress    Subacute endocarditis 02/10/2021    Past Surgical History:  Procedure Laterality Date   BUBBLE STUDY  02/10/2021   Procedure: BUBBLE STUDY;  Surgeon: Claudene Pacific, MD;  Location: [facility] ENDOSCOPY;  Service: Cardiovascular;;   CHOLECYSTECTOMY  2010   TEE WITHOUT CARDIOVERSION N/A 02/10/2021   Procedure: TRANSESOPHAGEAL ECHOCARDIOGRAM (TEE);  Surgeon: Claudene Pacific, MD;  Location: [facility] ENDOSCOPY;  Service:  Cardiovascular;  Laterality: N/A;   TEE WITHOUT CARDIOVERSION N/A 03/27/2021   Procedure: TRANSESOPHAGEAL ECHOCARDIOGRAM (TEE);  Surgeon: Claudene Pacific, MD;  Location: [facility] ENDOSCOPY;  Service: Cardiovascular;  Laterality: N/A;     Current Outpatient Medications  Medication Sig Dispense Refill   amLODipine (NORVASC) 5 MG tablet Take 1 tablet (5 mg total) by mouth daily. 180 tablet 3   Baclofen  5 MG TABS Take 1 tablet (5 mg  total) by mouth 3 (three) times daily. 90 tablet 5   carvedilol  (COREG ) 25 MG tablet Take 1 tablet (25 mg total) by mouth 2 (two) times daily. 180 tablet 3   clopidogrel  (PLAVIX ) 75 MG tablet TAKE 1 TABLET BY MOUTH EVERY DAY 90 tablet 1   DULoxetine  (CYMBALTA ) 30 MG capsule TAKE 3 CAPSULES BY MOUTH EVERY DAY 90 capsule 5   EPINEPHrine  0.3 mg/0.3 mL IJ SOAJ injection Inject 0.3 mg into the muscle daily as needed for anaphylaxis.     Insulin  Pen Needle (PEN NEEDLES) 29G X MISC 1 each by Does not apply route daily. 30 each 2   L-Methylfolate-B6-B12 (FOLTANX) 3-35-2 MG TABS TAKE 1 TABLET BY MOUTH EVERY DAY 90 tablet 1   LANTUS  SOLOSTAR 100 UNIT/ML Solostar Pen Inject 60 Units into the skin at bedtime.     OZEMPIC, 0.25 OR 0.5 MG/DOSE, 2 MG/3ML SOPN Inject 0.5 mg Subcutaneous once weekly; Duration: 28 days     pregabalin  (LYRICA ) 150 MG capsule Take 1 capsule (150 mg total) by mouth 3 (three) times daily. (Patient taking differently: Take 50 mg by mouth 3 (three) times daily. 50 in morning 100 bedtime) 90 capsule 5   rosuvastatin (CRESTOR) 20 MG tablet Take 1 tablet (20 mg total) by mouth daily. 90 tablet 3   traMADol  (ULTRAM ) 50 MG tablet TAKE 1 TABLET BY MOUTH 3 TIMES A DAY AS NEEDED FOR MODERATE PAIN. 90 tablet 2   valsartan  (DIOVAN ) 160 MG tablet Take 1 tablet (160 mg total) by mouth daily. 90 tablet 3   Vitamin D , Cholecalciferol, 25 MCG (1000 UT) CAPS 1 capsule.     No current facility-administered medications for this visit.    Allergies:   Sulfa antibiotics, Aldactone  [spironolactone ], Dulaglutide, Empagliflozin, Iodinated contrast media, Lipitor  [atorvastatin  calcium ], and Latex    Social History:  The patient  reports that she has never smoked. She has never used smokeless tobacco. She reports current alcohol use. She reports that she does not use drugs.   Family History:  The patient's family history includes CVA in her maternal aunt; Diabetes in her maternal aunt; Hypertension in  her father and mother; Other in her mother; Stroke in her father.    ROS:  Please see the history of present illness.   Otherwise, review of systems are positive for none.   All other systems are reviewed and negative.    PHYSICAL EXAM: VS:  BP (!) 180/90 (BP Location: Left Arm, Patient Position: Sitting, Cuff Size: Normal)   Pulse 90   Ht 5' 5 (1.651 m)   Wt 193 lb (87.5 kg)   LMP 11/25/2011   SpO2 96%   BMI 32.12 kg/m  , BMI Body mass index is 32.12 kg/m. GEN: Well nourished, well developed, in no acute distress  HEENT: normal  Neck: no JVD, carotid bruits, or masses Cardiac: RRR; no murmurs, rubs, or gallops,no edema  Respiratory:  clear to auscultation bilaterally, normal work of breathing GI: soft, nontender, nondistended, + BS MS: no deformity or atrophy  Skin: warm and dry,  no rash Neuro:  Strength and sensation are intact Psych: euthymic mood, full affect   EKG Interpretation Date/Time:  Wednesday September 05 2024 14:42:59 EDT Ventricular Rate:  90 PR Interval:  172 QRS Duration:  88 QT Interval:  374 QTC Calculation: 457 R Axis:   11  Text Interpretation: Normal sinus rhythm Minimal voltage criteria for LVH, may be normal variant ( Cornell product ) Possible Inferior infarct , age undetermined When compared with ECG of Apr 11, 2023  No significant change was found  Confirmed by [place], Lutisha Knoche [phone]) on 09/05/2024 2:55:33 PM    Recent Labs: No results found for requested labs within last 365 days.    Lipid Panel    Component Value Date/Time   CHOL 98 (L) 03/22/2022 1038   TRIG 78 03/22/2022 1038   HDL 44 03/22/2022 1038   CHOLHDL 2.2 03/22/2022 1038   CHOLHDL 8.4 03/13/2021 0445   VLDL 77 (H) 03/13/2021 0445   LDLCALC 38 03/22/2022 1038    Dated 05/12/22: glucose 130, otherwise CMET normal. Cholesterol 112, triglycerides 114, HDL 37, LDL 55. A1c 6.5%. Dated 10/03/23: cholesterol 154, triglycerides 231, HDL 40, LDL 76.  Dated 12/23/23: A1c 10.4. CMET  and TSH normal Dated 08/02/24: normal CMET. A1c 8.2%.  Dated 08/23/24: cholesterol 258, triglycerides 310, HDL 39, LDL 160   Wt Readings from Last 3 Encounters:  09/05/24 193 lb (87.5 kg)  05/07/24 188 lb (85.3 kg)  01/11/24 180 lb (81.6 kg)      Other studies Reviewed: Additional studies/ records that were reviewed today include:    Echo 03/23/22: IMPRESSIONS     1. Left ventricular ejection fraction, by estimation, is 60 to 65%. The  left ventricle has normal function. The left ventricle has no regional  wall motion abnormalities. There is moderate left ventricular hypertrophy.  Left ventricular diastolic  parameters were normal.   2. Right ventricular systolic function is normal. The right ventricular  size is normal. Tricuspid regurgitation signal is inadequate for assessing  PA pressure.   3. The mitral valve is normal in structure. Trivial mitral valve  regurgitation.   4. The aortic valve was not well visualized. Aortic valve regurgitation  is not visualized. No aortic stenosis is present.   5. The inferior vena cava is normal in size with greater than 50%  respiratory variability, suggesting right atrial pressure of 3 mmHg.   Echo 6/624: IMPRESSIONS     1. Left ventricular ejection fraction, by estimation, is 65 to 70%. The  left ventricle has normal function. The left ventricle has no regional  wall motion abnormalities. There is moderate concentric left ventricular  hypertrophy. Left ventricular  diastolic parameters are consistent with Grade I diastolic dysfunction  (impaired relaxation).   2. Right ventricular systolic function is normal. The right ventricular  size is normal. Tricuspid regurgitation signal is inadequate for assessing  PA pressure.   3. The mitral valve is grossly normal. Trivial mitral valve  regurgitation.   4. The aortic valve is tricuspid. Aortic valve regurgitation is not  visualized.   5. The inferior vena cava is normal in size with  greater than 50%  respiratory variability, suggesting right atrial pressure of 3 mmHg.   Comparison(s): Changes from prior study are noted. 03/23/2022: LVEF 60-65%.   Echo 05/21/24: IMPRESSIONS     1. Left ventricular ejection fraction, by estimation, is 60 to 65%. Left  ventricular ejection fraction by 3D volume is 60 %. The left ventricle has  normal function. The  left ventricle has no regional wall motion  abnormalities. There is mild left  ventricular hypertrophy. Left ventricular diastolic parameters are  indeterminate.   2. Right ventricular systolic function is normal. The right ventricular  size is normal. Tricuspid regurgitation signal is inadequate for assessing  PA pressure.   3. The mitral valve is grossly normal. Trivial mitral valve  regurgitation. No evidence of mitral stenosis.   4. The aortic valve is tricuspid. Aortic valve regurgitation is not  visualized. No aortic stenosis is present.   5. The inferior vena cava is normal in size with greater than 50%  respiratory variability, suggesting right atrial pressure of 3 mmHg.     ASSESSMENT AND PLAN:  1.  Recurrent CVA. This appears to be related to small vessel cerebrovascular disease related to HTN, DM, and HLD. Will continue Plavix .   2. Chronic systolic CHF. EF initially reduced at 45-50%. Echo showed normal LV function off Entresto .   3. HTN. Significant increase and refractory. Based on recent lab doubt hypoaldo. Will check renal duplex. Will increase Coreg  to 25 mg bid and add amlodipine 5 mg daily. Arrange follow up in HTN clinic in one month. Sodium restriction  4. Hypercholesterolemia. Intolerant of lipitor  due to diarrhea. LDL 160. Will try Crestor 20 mg daily. If similar reaction will need to consider Repatha  5. DM per primary care. A1c 8.2%. per Endocrinology.     Signed, Garold Sheeler [place], MD  09/05/2024 3:18 PM    [facility] [address] [place], [place], [address] Phone  [phone], Fax [phone]

## 2024-09-05 ENCOUNTER — Encounter: Payer: Self-pay | Admitting: Cardiology

## 2024-09-05 ENCOUNTER — Ambulatory Visit: Attending: Cardiology | Admitting: Cardiology

## 2024-09-05 VITALS — BP 180/90 | HR 90 | Ht 65.0 in | Wt 193.0 lb

## 2024-09-05 DIAGNOSIS — I10 Essential (primary) hypertension: Secondary | ICD-10-CM | POA: Diagnosis not present

## 2024-09-05 DIAGNOSIS — I6350 Cerebral infarction due to unspecified occlusion or stenosis of unspecified cerebral artery: Secondary | ICD-10-CM | POA: Diagnosis not present

## 2024-09-05 DIAGNOSIS — I5022 Chronic systolic (congestive) heart failure: Secondary | ICD-10-CM

## 2024-09-05 DIAGNOSIS — E782 Mixed hyperlipidemia: Secondary | ICD-10-CM | POA: Diagnosis not present

## 2024-09-05 MED ORDER — ROSUVASTATIN CALCIUM 20 MG PO TABS: 20.0000 mg | ORAL_TABLET | Freq: Every day | ORAL | 3 refills | Status: DC

## 2024-09-05 MED ORDER — CARVEDILOL 25 MG PO TABS: 25.0000 mg | ORAL_TABLET | Freq: Two times a day (BID) | ORAL | 3 refills | Status: DC

## 2024-09-05 MED ORDER — AMLODIPINE BESYLATE 5 MG PO TABS: 5.0000 mg | ORAL_TABLET | Freq: Every day | ORAL | 3 refills | Status: DC

## 2024-09-05 NOTE — Patient Instructions (Signed)
 Medication Instructions:  Start Crestor 20 mg daily Increase Carvedilol  to 25 mg twice a day Start Amlodipine 5 mg daily  Continue all other medications *If you need a refill on your cardiac medications before your next appointment, please call your pharmacy*  Lab Work: None ordered  Testing/Procedures: Renal Duplex  Follow-Up: At [facility], you and your health needs are our priority.  As part of our continuing mission to provide you with exceptional heart care, our providers are all part of one team.  This team includes your primary Cardiologist (physician) and Advanced Practice Providers or APPs (Physician Assistants and Nurse Practitioners) who all work together to provide you with the care you need, when you need it.  Your next appointment:  3 months     Provider:  Dr.Jordan        Schedule appointment with Pharm D in Hypertension Clinic in 4 weeks   We recommend signing up for the patient portal called MyChart.  Sign up information is provided on this After Visit Summary.  MyChart is used to connect with patients for Virtual Visits (Telemedicine).  Patients are able to view lab/test results, encounter notes, upcoming appointments, etc.  Non-urgent messages can be sent to your provider as well.   To learn more about what you can do with MyChart, go to [email or web address].

## 2024-09-12 ENCOUNTER — Ambulatory Visit: Payer: Self-pay | Admitting: Cardiology

## 2024-09-12 ENCOUNTER — Ambulatory Visit (HOSPITAL_COMMUNITY)
Admission: RE | Admit: 2024-09-12 | Discharge: 2024-09-12 | Disposition: A | Discharge status: Home / Self Care | Source: Ambulatory Visit | Attending: Cardiology | Admitting: Cardiology

## 2024-09-12 DIAGNOSIS — E782 Mixed hyperlipidemia: Secondary | ICD-10-CM | POA: Diagnosis not present

## 2024-09-12 DIAGNOSIS — I10 Essential (primary) hypertension: Secondary | ICD-10-CM | POA: Insufficient documentation

## 2024-09-12 DIAGNOSIS — I5022 Chronic systolic (congestive) heart failure: Secondary | ICD-10-CM | POA: Insufficient documentation

## 2024-09-12 DIAGNOSIS — I6350 Cerebral infarction due to unspecified occlusion or stenosis of unspecified cerebral artery: Secondary | ICD-10-CM | POA: Diagnosis not present

## 2024-09-24 ENCOUNTER — Telehealth: Payer: Self-pay | Admitting: Cardiology

## 2024-09-24 ENCOUNTER — Telehealth: Payer: Self-pay | Admitting: Neurology

## 2024-09-24 NOTE — Telephone Encounter (Signed)
 No confusion,no numbness,tingling. Just excessive sleeping. She just wants to sleep Blood sugar levels 115  Started a new BP medications Valsartan , Amlodipine,Rosuvastatin. With her Cardiologist last week.     Advised patient Dr.Jaffe is out of the office this week, Will send message back and to please call her Cardiologists as well.

## 2024-09-24 NOTE — Telephone Encounter (Signed)
 Pt is only sleeping 16 hrs, dose not know if she had another stroke

## 2024-09-24 NOTE — Telephone Encounter (Signed)
 Pt states she is severely fatigued and sleeping up to 16 hours a day. She had some fatigue before her recent medication change, but it is more severe now. Please advise.

## 2024-09-24 NOTE — Telephone Encounter (Signed)
 Returned patient's call to discuss symptoms.  Patient reports her medications were changed at her last OV on 10/8 to better control her BP. She reports her main concern at that time was the severe fatigue she was experiencing (ie causing her to sleep til noon, falling asleep as soon as she sits down) and she thought the change in medication would help alleviate her fatigue. She was also restarted on statin medication at that time after being off them for 3-4 mos.   She reports no improvement in her fatigue although her BP is better controlled. She gives her BP as 125/82 today, several hours after taking her bp meds this morning. She denies any muscle or joint aches since restarting statin meds. She denies any sick contacts and gives her most recent blood sugar as 115. She denies any leg swelling or SOB. She has not had work up for this fatigue with her PCP. Forwarded to Dr. Jordan for advice.

## 2024-09-24 NOTE — Telephone Encounter (Signed)
 Patient advised Per Dr.Hill, Typically there are other neurologic signs like numbness, weakness, vision changes, etc when someone has a stroke. If she has these symptoms come on, we would definitely recommend she go to the ED. Excessive sleepiness can have many causes, stroke being rare to be the cause if there are no other symptoms. If she is concerned about stroke though, this would always be something she should get checked out for urgently (ED).

## 2024-09-26 NOTE — Telephone Encounter (Signed)
 Call to patient to advise that per Dr. Jordan, BP looks good and medication changes are likely not causing her fatigue. Patient agrees to discuss with PCP.  Patient states she needs a refill of valsartan  but says she is taking 320 mg , not 160 mg as it states in chart.  Telephone encounter states she self-increased valsartan  to 320 mg on 08/27/24.  Sent to Dr. Jordan for clarification.

## 2024-09-27 MED ORDER — VALSARTAN 320 MG PO TABS: 320.0000 mg | ORAL_TABLET | Freq: Every day | ORAL | 3 refills | Status: DC

## 2024-09-27 NOTE — Addendum Note (Signed)
 Addended by: RICHIE ADRIEN ORN on: 09/27/2024 08:11 AM   Modules accepted: Orders

## 2024-09-27 NOTE — Telephone Encounter (Signed)
Spoke with pt, aware new script has been sent to the pharmacy.

## 2024-10-13 NOTE — Progress Notes (Unsigned)
 Patient ID: Sheila Guerrero                 DOB: [birth year]                      MRN: [id number]      HPI: Sheila Guerrero is a 63 y.o. female referred by Dr. Jordan to HTN clinic. PMH is significant for HTN, DM type 2, HLD, and stroke. Seen by Dr. Jordan on 09/05/24. There was a significant increase in her HTN. carvedilol  was increased to 25mg  and amlodipine 5mg  daily was added. She was instructed to restrict her sodium. There was concern for renal artery stenosis. An ultrasound confirmed that patient does not have this.   Current HTN meds: amlodipine 5mg  daily, carvedilol  25mg  BID, valsartan  320mg  daily Previously tried: lisinopril  20mg  daily (diarrhea), toprol XL 100mg  daily,  BP goal: <130/80  Family History:  Mother: HTN Father: HTN, Stroke  Social History:  Never smoked  Diet:   Exercise:  {types:28256}  Home BP readings:  Date SBP/DBP  HR                              Average      Wt Readings from Last 3 Encounters:  09/05/24 193 lb (87.5 kg)  05/07/24 188 lb (85.3 kg)  01/11/24 180 lb (81.6 kg)   BP Readings from Last 3 Encounters:  09/05/24 (!) 180/90  05/07/24 (!) 150/86  12/30/23 (!) 140/82   Pulse Readings from Last 3 Encounters:  09/05/24 90  05/07/24 94  01/11/24 (!) 111    Renal function: CrCl cannot be calculated (Patient's most recent lab result is older than the maximum 21 days allowed.).  Past Medical History:  Diagnosis Date   Angioedema    Anxiety    Asthma    Depression    Diabetes mellitus, type II (HCC)    Dyslipidemia    Family history of adverse reaction to anesthesia    mother severely confused after general anesthesia   History of CVA (cerebrovascular accident)    HTN (hypertension) 2013   Panic attacks    Pneumonia    Septic embolism (HCC) 02/10/2021   Situational stress    Subacute endocarditis 02/10/2021    Current Outpatient Medications on File Prior to Visit  Medication Sig Dispense Refill   amLODipine  (NORVASC) 5 MG tablet Take 1 tablet (5 mg total) by mouth daily. 180 tablet 3   Baclofen  5 MG TABS Take 1 tablet (5 mg total) by mouth 3 (three) times daily. 90 tablet 5   carvedilol  (COREG ) 25 MG tablet Take 1 tablet (25 mg total) by mouth 2 (two) times daily. 180 tablet 3   clopidogrel  (PLAVIX ) 75 MG tablet TAKE 1 TABLET BY MOUTH EVERY DAY 90 tablet 1   DULoxetine  (CYMBALTA ) 30 MG capsule TAKE 3 CAPSULES BY MOUTH EVERY DAY 90 capsule 5   EPINEPHrine  0.3 mg/0.3 mL IJ SOAJ injection Inject 0.3 mg into the muscle daily as needed for anaphylaxis.     Insulin  Pen Needle (PEN NEEDLES) 29G X MISC 1 each by Does not apply route daily. 30 each 2   L-Methylfolate-B6-B12 (FOLTANX) 3-35-2 MG TABS TAKE 1 TABLET BY MOUTH EVERY DAY 90 tablet 1   LANTUS  SOLOSTAR 100 UNIT/ML Solostar Pen Inject 60 Units into the skin at bedtime.     OZEMPIC, 0.25 OR 0.5 MG/DOSE, 2 MG/3ML SOPN Inject 0.5 mg  Subcutaneous once weekly; Duration: 28 days     pregabalin  (LYRICA ) 150 MG capsule Take 1 capsule (150 mg total) by mouth 3 (three) times daily. (Patient taking differently: Take 50 mg by mouth 3 (three) times daily. 50 in morning 100 bedtime) 90 capsule 5   rosuvastatin (CRESTOR) 20 MG tablet Take 1 tablet (20 mg total) by mouth daily. 90 tablet 3   traMADol  (ULTRAM ) 50 MG tablet TAKE 1 TABLET BY MOUTH 3 TIMES A DAY AS NEEDED FOR MODERATE PAIN. 90 tablet 2   valsartan  (DIOVAN ) 320 MG tablet Take 1 tablet (320 mg total) by mouth daily. 90 tablet 3   Vitamin D , Cholecalciferol, 25 MCG (1000 UT) CAPS 1 capsule.     No current facility-administered medications on file prior to visit.    Allergies  Allergen Reactions   Sulfa Antibiotics Swelling   Aldactone  [Spironolactone ]     Inner ear infection    Dulaglutide     Other reaction(s): rash   Empagliflozin     Other reaction(s): recurrent yeast   Iodinated Contrast Media     Other reaction(s): angioedema with CT dye   Lipitor  [Atorvastatin  Calcium ] Diarrhea   Latex  Rash    Last menstrual period 11/25/2011.   Assessment/Plan: No BP recorded.  {Refresh Note OR Click here to enter BP  :1}***   1. Hypertension -  No problem-specific Assessment & Plan notes found for this encounter.      Thank you  Melissa D Maccia, Pharm.JONETTA SARAN, CPP [facility] [address] [place], [place] [address]  Phone: [phone]; Fax: [phone]

## 2024-10-15 ENCOUNTER — Ambulatory Visit: Admitting: Pharmacist

## 2024-11-14 ENCOUNTER — Other Ambulatory Visit: Payer: Self-pay | Admitting: Neurology

## 2024-12-07 NOTE — Progress Notes (Unsigned)
 "    Cardiology Office Note   Date:  12/07/2024   ID:  Sheila Guerrero, DOB 08-Sep-1961, MRN [id number]  PCP:  Seabron Lenis, MD  Cardiologist:   Alliyah Roesler, MD   No chief complaint on file.      History of Present Illness: Sheila Guerrero is a 64 y.o. female who is seen for follow up of CHF and HTN.  She has a history of HTN, DM type 2, HLD, and prior CVA in 2017. She was admitted in March 2022 with left sided paresthesias and progressive left sided weakness. She was admitted with acute CVA. MRI brain demonstrated-small acute to early subacute infarct in the right thalamus/posterior limb of the internal capsule, small subacute left occipital lobe infarct, moderately advanced chronic small vessel ischemic disease progressed from 2018 and numerous chronic microhemorrhages consistent with chronic hypertension. Carotid dopplers were negative. Echo showed EF 40-45% (new since 2018). Subsequent TEE reportedly showed multiple small vegetations on both MV leaflets with moderate MR and a ? larger sessile vegetation on the tricuspid valve. Patient had no fever or other source of infection. Multiple blood cultures were negative. She was treated for culture negative endocarditis with IV Rocephin  and daptomycin  for 6 weeks. DC'd on Entresto  and beta blocker. She was readmitted in April with confusion and difficulty speaking and walking. MRI showed subacute left thalamic infarct. CT head negative. EEG was normal. TEE was repeated on April 29 showing EF 45-50%. Reportedly smaller vegetation on medial MV leaflet and tricuspid valve. ? Small vegetation on noncoronary AV cusp. Mild MR and TR. I also reviewed her Echo and TEE images. I  did no feel that there was convincing evidence of vegetations. She has some fine filamentous strands noted on the MV of uncertain significance.  Given lack of any clinical evidence of infection and normal blood cultures I think the weight of evidence is that she never had  endocarditis.  She is on Plavix  monotherapy  She had follow up Echo in April 2022 and June 2023 showing normal LV function. EF 60-65%. Repeat Echo this June showed normal LV function  On follow up today she is seen with her husband. Over the past 2-3 months she has noted significant increase in BP readings. Was started on valsartan  and titrated to 320 mg daily but BP still poorly controlled. Readings 145-200 systolic. She feels fine except is fatigued. No chest pain, dyspnea, edema, palpitations or new Neurologic symptoms. She also had bad diarrhea on lipitor  and this was stopped. Tried again and symptoms recurred.    Renal duplex showed no RAS.    Past Medical History:  Diagnosis Date   Angioedema    Anxiety    Asthma    Depression    Diabetes mellitus, type II (HCC)    Dyslipidemia    Family history of adverse reaction to anesthesia    mother severely confused after general anesthesia   History of CVA (cerebrovascular accident)    HTN (hypertension) 2013   Panic attacks    Pneumonia    Septic embolism (HCC) 02/10/2021   Situational stress    Subacute endocarditis 02/10/2021    Past Surgical History:  Procedure Laterality Date   BUBBLE STUDY  02/10/2021   Procedure: BUBBLE STUDY;  Surgeon: Claudene Pacific, MD;  Location: [facility] ENDOSCOPY;  Service: Cardiovascular;;   CHOLECYSTECTOMY  2010   TEE WITHOUT CARDIOVERSION N/A 02/10/2021   Procedure: TRANSESOPHAGEAL ECHOCARDIOGRAM (TEE);  Surgeon: Claudene Pacific, MD;  Location: [facility] ENDOSCOPY;  Service:  Cardiovascular;  Laterality: N/A;   TEE WITHOUT CARDIOVERSION N/A 03/27/2021   Procedure: TRANSESOPHAGEAL ECHOCARDIOGRAM (TEE);  Surgeon: Claudene Pacific, MD;  Location: [facility] ENDOSCOPY;  Service: Cardiovascular;  Laterality: N/A;     Current Outpatient Medications  Medication Sig Dispense Refill   amLODipine  (NORVASC ) 5 MG tablet Take 1 tablet (5 mg total) by mouth daily. 180 tablet 3   Baclofen  5 MG TABS Take 1 tablet (5 mg total) by mouth 3  (three) times daily. 90 tablet 5   carvedilol  (COREG ) 25 MG tablet Take 1 tablet (25 mg total) by mouth 2 (two) times daily. 180 tablet 3   clopidogrel  (PLAVIX ) 75 MG tablet TAKE 1 TABLET BY MOUTH EVERY DAY 90 tablet 1   DULoxetine  (CYMBALTA ) 30 MG capsule TAKE 3 CAPSULES BY MOUTH EVERY DAY 90 capsule 5   EPINEPHrine  0.3 mg/0.3 mL IJ SOAJ injection Inject 0.3 mg into the muscle daily as needed for anaphylaxis.     Insulin  Pen Needle (PEN NEEDLES) 29G X MISC 1 each by Does not apply route daily. 30 each 2   L-Methylfolate-B6-B12 (FOLTANX) 3-35-2 MG TABS TAKE 1 TABLET BY MOUTH EVERY DAY 90 tablet 1   LANTUS  SOLOSTAR 100 UNIT/ML Solostar Pen Inject 60 Units into the skin at bedtime.     OZEMPIC, 0.25 OR 0.5 MG/DOSE, 2 MG/3ML SOPN Inject 0.5 mg Subcutaneous once weekly; Duration: 28 days     pregabalin  (LYRICA ) 150 MG capsule Take 1 capsule (150 mg total) by mouth 3 (three) times daily. (Patient taking differently: Take 50 mg by mouth 3 (three) times daily. 50 in morning 100 bedtime) 90 capsule 5   rosuvastatin  (CRESTOR ) 20 MG tablet Take 1 tablet (20 mg total) by mouth daily. 90 tablet 3   traMADol  (ULTRAM ) 50 MG tablet TAKE 1 TABLET BY MOUTH 3 TIMES A DAY AS NEEDED FOR MODERATE PAIN. 90 tablet 2   valsartan  (DIOVAN ) 320 MG tablet Take 1 tablet (320 mg total) by mouth daily. 90 tablet 3   Vitamin D , Cholecalciferol, 25 MCG (1000 UT) CAPS 1 capsule.     No current facility-administered medications for this visit.    Allergies:   Sulfa antibiotics, Aldactone  [spironolactone ], Dulaglutide, Empagliflozin, Iodinated contrast media, Lipitor  [atorvastatin  calcium ], and Latex    Social History:  The patient  reports that she has never smoked. She has never used smokeless tobacco. She reports current alcohol use. She reports that she does not use drugs.   Family History:  The patient's family history includes CVA in her maternal aunt; Diabetes in her maternal aunt; Hypertension in her father and  mother; Other in her mother; Stroke in her father.    ROS:  Please see the history of present illness.   Otherwise, review of systems are positive for none.   All other systems are reviewed and negative.    PHYSICAL EXAM: VS:  LMP 11/25/2011  , BMI There is no height or weight on file to calculate BMI. GEN: Well nourished, well developed, in no acute distress  HEENT: normal  Neck: no JVD, carotid bruits, or masses Cardiac: RRR; no murmurs, rubs, or gallops,no edema  Respiratory:  clear to auscultation bilaterally, normal work of breathing GI: soft, nontender, nondistended, + BS MS: no deformity or atrophy  Skin: warm and dry, no rash Neuro:  Strength and sensation are intact Psych: euthymic mood, full affect        Recent Labs: No results found for requested labs within last 365 days.    Lipid  Panel    Component Value Date/Time   CHOL 98 (L) 03/22/2022 1038   TRIG 78 03/22/2022 1038   HDL 44 03/22/2022 1038   CHOLHDL 2.2 03/22/2022 1038   CHOLHDL 8.4 03/13/2021 0445   VLDL 77 (H) 03/13/2021 0445   LDLCALC 38 03/22/2022 1038    Dated 05/12/22: glucose 130, otherwise CMET normal. Cholesterol 112, triglycerides 114, HDL 37, LDL 55. A1c 6.5%. Dated 10/03/23: cholesterol 154, triglycerides 231, HDL 40, LDL 76.  Dated 12/23/23: A1c 10.4. CMET and TSH normal Dated 08/02/24: normal CMET. A1c 8.2%.  Dated 08/23/24: cholesterol 258, triglycerides 310, HDL 39, LDL 160   Wt Readings from Last 3 Encounters:  09/05/24 193 lb (87.5 kg)  05/07/24 188 lb (85.3 kg)  01/11/24 180 lb (81.6 kg)      Other studies Reviewed: Additional studies/ records that were reviewed today include:    Echo 03/23/22: IMPRESSIONS     1. Left ventricular ejection fraction, by estimation, is 60 to 65%. The  left ventricle has normal function. The left ventricle has no regional  wall motion abnormalities. There is moderate left ventricular hypertrophy.  Left ventricular diastolic  parameters were  normal.   2. Right ventricular systolic function is normal. The right ventricular  size is normal. Tricuspid regurgitation signal is inadequate for assessing  PA pressure.   3. The mitral valve is normal in structure. Trivial mitral valve  regurgitation.   4. The aortic valve was not well visualized. Aortic valve regurgitation  is not visualized. No aortic stenosis is present.   5. The inferior vena cava is normal in size with greater than 50%  respiratory variability, suggesting right atrial pressure of 3 mmHg.   Echo 6/624: IMPRESSIONS     1. Left ventricular ejection fraction, by estimation, is 65 to 70%. The  left ventricle has normal function. The left ventricle has no regional  wall motion abnormalities. There is moderate concentric left ventricular  hypertrophy. Left ventricular  diastolic parameters are consistent with Grade I diastolic dysfunction  (impaired relaxation).   2. Right ventricular systolic function is normal. The right ventricular  size is normal. Tricuspid regurgitation signal is inadequate for assessing  PA pressure.   3. The mitral valve is grossly normal. Trivial mitral valve  regurgitation.   4. The aortic valve is tricuspid. Aortic valve regurgitation is not  visualized.   5. The inferior vena cava is normal in size with greater than 50%  respiratory variability, suggesting right atrial pressure of 3 mmHg.   Comparison(s): Changes from prior study are noted. 03/23/2022: LVEF 60-65%.   Echo 05/21/24: IMPRESSIONS     1. Left ventricular ejection fraction, by estimation, is 60 to 65%. Left  ventricular ejection fraction by 3D volume is 60 %. The left ventricle has  normal function. The left ventricle has no regional wall motion  abnormalities. There is mild left  ventricular hypertrophy. Left ventricular diastolic parameters are  indeterminate.   2. Right ventricular systolic function is normal. The right ventricular  size is normal. Tricuspid  regurgitation signal is inadequate for assessing  PA pressure.   3. The mitral valve is grossly normal. Trivial mitral valve  regurgitation. No evidence of mitral stenosis.   4. The aortic valve is tricuspid. Aortic valve regurgitation is not  visualized. No aortic stenosis is present.   5. The inferior vena cava is normal in size with greater than 50%  respiratory variability, suggesting right atrial pressure of 3 mmHg.     ASSESSMENT  AND PLAN:  1.  Recurrent CVA. This appears to be related to small vessel cerebrovascular disease related to HTN, DM, and HLD. Will continue Plavix .   2. Chronic systolic CHF. EF initially reduced at 45-50%. Echo showed normal LV function off Entresto .   3. HTN. Significant increase and refractory. Based on recent lab doubt hypoaldo. Renal duplex normal.  Will increase Coreg  to 25 mg bid and add amlodipine  5 mg daily. Arrange follow up in HTN clinic in one month. Sodium restriction  4. Hypercholesterolemia. Intolerant of lipitor  due to diarrhea. LDL 160. Will try Crestor  20 mg daily. If similar reaction will need to consider Repatha  5. DM per primary care. A1c 8.2%. per Endocrinology.     Signed, Hillari Zumwalt, MD  12/07/2024 8:07 AM    [facility] [address] [place], [place], [address] Phone [phone], Fax [phone] "

## 2024-12-10 ENCOUNTER — Ambulatory Visit: Attending: Cardiology | Admitting: Pharmacist

## 2024-12-10 ENCOUNTER — Ambulatory Visit: Admitting: Cardiology

## 2024-12-10 ENCOUNTER — Encounter: Payer: Self-pay | Admitting: Cardiology

## 2024-12-10 VITALS — BP 110/62 | HR 88 | Ht 65.0 in | Wt 189.0 lb

## 2024-12-10 VITALS — BP 110/68 | HR 90

## 2024-12-10 DIAGNOSIS — I10 Essential (primary) hypertension: Secondary | ICD-10-CM

## 2024-12-10 DIAGNOSIS — I5022 Chronic systolic (congestive) heart failure: Secondary | ICD-10-CM

## 2024-12-10 DIAGNOSIS — E782 Mixed hyperlipidemia: Secondary | ICD-10-CM

## 2024-12-10 MED ORDER — ROSUVASTATIN CALCIUM 20 MG PO TABS: 20.0000 mg | ORAL_TABLET | Freq: Every day | ORAL | 3 refills | Status: AC

## 2024-12-10 MED ORDER — CARVEDILOL 25 MG PO TABS: 25.0000 mg | ORAL_TABLET | Freq: Two times a day (BID) | ORAL | 3 refills | Status: AC

## 2024-12-10 MED ORDER — VALSARTAN 320 MG PO TABS: 320.0000 mg | ORAL_TABLET | Freq: Every day | ORAL | 3 refills | Status: AC

## 2024-12-10 MED ORDER — AMLODIPINE BESYLATE 5 MG PO TABS: 5.0000 mg | ORAL_TABLET | Freq: Every day | ORAL | 3 refills | Status: AC

## 2024-12-10 MED ORDER — EPINEPHRINE 0.3 MG/0.3ML IJ SOAJ: 0.3000 mg | Freq: Every day | INTRAMUSCULAR | 1 refills | Status: AC | PRN

## 2024-12-10 NOTE — Progress Notes (Unsigned)
 "  NEUROLOGY FOLLOW UP OFFICE NOTE  CHRISHAWN KRING [id number]  Assessment/Plan:   Thalamic pain syndrome Obstructive sleep apnea History of right thalamic stroke, likely secondary to small vessel disease Type 2 diabetes mellitus Hypertension Hyperlipidemia      For thalamic pain:             - continue Cymbalta  30mg  daliy - When ready, she will restart Lyrica  at 25mg  at bedtime.  Pending tolerance, we can increase to 50mg  at bedtime if needed and monitor for dizziness or other side effects. 2  CPAP for treatment of OSA, as per sleep medicine  3  Secondary stroke prevention as managed by PCP/cardiology:             - Plavix  75mg  daliy             - Statin therapy.  LDL goal less than 70             - Glycemic control. Hgb a1c goal less than 7             - Normotensive blood pressure 4  For treatment of left lower extremity muscle spasms and tightness:  - Tai Chi 5  Follow up 6 months.       Subjective:  KILA GODINA is a 64 year old right-handed female with CHF, HTN, HLD, DM II, asthma, OSA, recurrent angioedema and anxiety with panic attacks who follows up for thalamic pain syndrome.    UPDATE: Current medications: Cymbalta  30mg  daily   A couple of months ago, she was lethargic and sleeping all of the time.  Stopped taking Lyrica  (titrated down to 50mg  twice daily to 50mg  daily to stop) and baclofen  a couple of weeks ago and decreased duloxetine  to 30mg  daily.  Noticed that she is now less dizzy.  She is in more pain, however.  She doesn't think baclofen  is needed at this time.  Tai Chi helps with the leg spasms.    HISTORY:  Patient reportedly had a stroke in 2017-2018 that reportedly presented with confusion.  She later saw a neurologist.  MRI of brain personally reviewed on 03/06/2017 showed chronic left frontal subcortical lacunar infact and right thalamic chronic cerebral microhemorrhage vs dystrophic mineralization.  She was started on ASA. She has not  worked since this stroke.     In February 2022, she develop left sided upper and lower weakness and numbness.  She was treated for probable pinched nerve.  That march, she went to the ED due to worsening low back pain and was found to have a right thalamic/capsular stroke.  MRA of head and neck showed severe right MCA stenosis and severe distal right V4 stenosis.  Carotid ultrasound unremarkable.  TEE revealed vegetation on the mitral and tricuspid valves.  Evaluated by ID.  Although blood cultures were negative decision was made to treat with antibiotics.  She was discharged on ASA and Plavix  DAPT followed by Plavix  monotherapy.  In April 2022, she was readmitted to the hospital with confusion, speech disturbance and gait instability.  MRI of brain showed subacute left thalamic infarct.  EEG was normal.  Repeat TEE showed EF 45-50% with smaller vegetation on the mitral and tricuspid valves.  She followed up with cardiology who did not believe findings on TEE were vegetation but rather fine filamentous strands of uncertain significance.  Ultimately, her strokes were believed to be due to small vessel disease.  She was found by her previous neurologist to have a  methylenetetrahydrofolate reductase deficiency and was started on Metanx   Following the stroke, she developed a thalamic pain syndrome. Did not tolerate gabapentin.  She takes Lyrica  150mg  TID, Cymbalta  60mg  daily (also for depression) and tramadol  50mg  BID PRN.  Regimen is effective but causes drowsiness.  Once she had hallucinations (seeing things that quickly moved across her vision).  Therefore, she has cut back on dosing of Lyrica  and tramadol  to twice daily, which has helped alertness but notes some increased pain.    Past medications:  tramadol  50mg  QD-BID PRN (has not been taking), baclofen  5mg , Lyrica  50mg  in AM and 100mg  PM (higher doses increased somnolence),    03/13/2021 MRI BRAIN W WO/MRA HEAD & NECK:  1. Acute to early subacute right  and likely late subacute left thalamic infarcts. No mass effect or acute hemorrhage. 2. Severe stenosis of the distal right MCA M1 segment. 3. Severe stenosis of the distal right V4 segment. 4. No stenosis of the carotid or vertebral arteries in the neck.  02/09/2021 MRI BRAIN:  1. Patchy post-contrast enhancement about the recently identified right thalamocapsular and left occipital infarcts, in keeping with early subacute ischemic infarcts. No associated mass effect.  2. Underlying age-related cerebral atrophy with moderately advanced chronic microvascular ischemic disease. No other new intracranial abnormality on this limited postcontrast only exam. 02/07/2021 MRI BRAIN:  1. Small acute to early subacute infarct in the right thalamus/posterior limb of internal capsule. 2. Small subacute left occipital lobe infarct. 3. Moderately advanced chronic small vessel ischemic disease, progressed from 2018. 4. Numerous chronic microhemorrhages consistent with chronic hypertension. 02/07/2021 MRA HEAD:  1. Intracranial atherosclerosis including severe right M1 and moderate right V4 stenoses. 2. No large vessel occlusion. 03/02/2017 MRI BRAIN:  1. Round and ovoid periventricular and subcortical foci of T2 hyperintensites. Left frontal subcortical cystic gliosis vs chronic lacunar infarction. Left pontine T2 hyperintensity. Considerations include chronic small vessel ischemic disease, autoimmune, inflammatory or post-infectious etiologies.    2. Right thalamic chronic cerebral microhemorrhage vs dystrophic mineralization.  3. No acute findings.   PAST MEDICAL HISTORY: Past Medical History:  Diagnosis Date   Angioedema    Anxiety    Asthma    Depression    Diabetes mellitus, type II (HCC)    Dyslipidemia    Family history of adverse reaction to anesthesia    mother severely confused after general anesthesia   History of CVA (cerebrovascular accident)    HTN (hypertension) 2013   Panic attacks     Pneumonia    Septic embolism (HCC) 02/10/2021   Situational stress    Subacute endocarditis 02/10/2021    MEDICATIONS: Current Outpatient Medications on File Prior to Visit  Medication Sig Dispense Refill   amLODipine  (NORVASC ) 5 MG tablet Take 1 tablet (5 mg total) by mouth daily. 180 tablet 3   Baclofen  5 MG TABS Take 1 tablet (5 mg total) by mouth 3 (three) times daily. 90 tablet 5   carvedilol  (COREG ) 25 MG tablet Take 1 tablet (25 mg total) by mouth 2 (two) times daily. 180 tablet 3   clopidogrel  (PLAVIX ) 75 MG tablet TAKE 1 TABLET BY MOUTH EVERY DAY 90 tablet 1   DULoxetine  (CYMBALTA ) 30 MG capsule TAKE 3 CAPSULES BY MOUTH EVERY DAY 90 capsule 5   EPINEPHrine  0.3 mg/0.3 mL IJ SOAJ injection Inject 0.3 mg into the muscle daily as needed for anaphylaxis.     Insulin  Pen Needle (PEN NEEDLES) 29G X MISC 1 each by Does not apply route  daily. 30 each 2   L-Methylfolate-B6-B12 (FOLTANX) 3-35-2 MG TABS TAKE 1 TABLET BY MOUTH EVERY DAY 90 tablet 1   LANTUS  SOLOSTAR 100 UNIT/ML Solostar Pen Inject 60 Units into the skin at bedtime.     OZEMPIC, 0.25 OR 0.5 MG/DOSE, 2 MG/3ML SOPN Inject 0.5 mg Subcutaneous once weekly; Duration: 28 days     pregabalin  (LYRICA ) 150 MG capsule Take 1 capsule (150 mg total) by mouth 3 (three) times daily. (Patient taking differently: Take 50 mg by mouth 3 (three) times daily. 50 in morning 100 bedtime) 90 capsule 5   rosuvastatin  (CRESTOR ) 20 MG tablet Take 1 tablet (20 mg total) by mouth daily. 90 tablet 3   traMADol  (ULTRAM ) 50 MG tablet TAKE 1 TABLET BY MOUTH 3 TIMES A DAY AS NEEDED FOR MODERATE PAIN. 90 tablet 2   valsartan  (DIOVAN ) 320 MG tablet Take 1 tablet (320 mg total) by mouth daily. 90 tablet 3   Vitamin D , Cholecalciferol, 25 MCG (1000 UT) CAPS 1 capsule.     No current facility-administered medications on file prior to visit.    ALLERGIES: Allergies  Allergen Reactions   Sulfa Antibiotics Swelling   Aldactone  [Spironolactone ]     Inner ear  infection    Dulaglutide     Other reaction(s): rash   Empagliflozin     Other reaction(s): recurrent yeast   Iodinated Contrast Media     Other reaction(s): angioedema with CT dye   Lipitor  [Atorvastatin  Calcium ] Diarrhea   Latex Rash    FAMILY HISTORY: Family History  Problem Relation Age of Onset   Hypertension Mother    Other Mother        blood disorder   Hypertension Father    Stroke Father    Diabetes Maternal Aunt    CVA Maternal Aunt       Objective:  Blood pressure 128/74, pulse 96, height 5' 5 (1.651 m), weight 190 lb 9.6 oz (86.5 kg), last menstrual period 11/25/2011, SpO2 95%. General: No acute distress.  Patient appears well-groomed.   Head:  Normocephalic/atraumatic Neck:  Supple.  No paraspinal tenderness.  Full range of motion. Heart:  Regular rate and rhythm. Neuro:  Alert and oriented.  Speech fluent and not dysarthric.  Language intact.  Tingling of left side of face.  Otherwise CN II-XII intact.  Bulk and tone normal.  Muscle strength 5/5 throughout.  Sensation to light touch intact.  Deep tendon reflexes 2+ throughout, toes downgoing.  Gait cautious.  Romberg with sway.     Juliene Dunnings, DO  CC: Alm Rav, MD      "

## 2024-12-10 NOTE — Progress Notes (Signed)
 Patient ID: Sheila Guerrero                 DOB: 1961/08/18                      MRN: [id number]      HPI: Sheila Guerrero is a 64 y.o. female referred by Dr. Jordan to HTN clinic. PMH is significant for recurrent CVA, HTN, DM, CHF with normalized EF. Seen by Dr. Jordan in Oct. BP was very high. Carvedilol  was increased to 25mg  BID and amlodipine  5mg  was added. Patient canceled her Nov PharmD appointment.  Patient presents today to HTN clinic accompanied by her husband. She feels much better since medication changes. She has also weaned her self off her lyrica  and decreased her cymbalta  dose for nerve pain.  Reports BP in AM is 140/90's but after medications it is 120/70. She is wearing her CPAP most nights. The nose piece bothers her eyes and causes some headaches. Denies any SOB or swelling, dizziness. Doing tai chi and enjoying it/finding it helpful.  We discussed the benefits of GLP-1 therapy. How maximizing GLP-1 could help decrease insulin  use. Has been on 0.5mg  of ozempic for a few months. Recommended discussing with endo either increasing to 1mg  or changing to Mounjaro. They also ask about VitB levels and supplementation. They were told by a different endocrinologist that she has an enzyme defect and needed this supplement. I recommended they discuss with Dr. Skeet tomorrow.   Current HTN meds: carvedilol  25mg  twice a day, valsartan  320mg  daily, amlodipine  5mg  daily  Previously tried:  BP goal: <130/80  Family History: The patient's family history includes CVA in her maternal aunt; Diabetes in her maternal aunt; Hypertension in her father and mother; Other in her mother; Stroke in her father.   Social History:  Social History   Socioeconomic History   Marital status: Married    Spouse name: Sheila Guerrero   Number of children: 0   Years of education: Bachelors   Highest education level: Not on file  Occupational History   Occupation: Homemaker now  Tobacco Use   Smoking status:  Never   Smokeless tobacco: Never  Vaping Use   Vaping status: Never Used  Substance and Sexual Activity   Alcohol use: Yes    Comment: rarely   Drug use: Never   Sexual activity: Yes    Birth control/protection: None  Other Topics Concern   Not on file  Social History Narrative   Lives at home with husband.in a one story home   Right-handed.   No daily use of caffeine.   Social Drivers of Health   Tobacco Use: Low Risk (12/10/2024)   Patient History    Smoking Tobacco Use: Never    Smokeless Tobacco Use: Never    Passive Exposure: Not on file  Financial Resource Strain: Not on file  Food Insecurity: Not on file  Transportation Needs: Not on file  Physical Activity: Not on file  Stress: Not on file  Social Connections: Not on file  Intimate Partner Violence: Not on file  Depression (EYV7-0): Not on file  Alcohol Screen: Not on file  Housing: Not on file  Utilities: Not on file  Health Literacy: Not on file     Diet: not discussed today  Exercise:  Tai Chi  Home BP readings:  120/70 after meds   Wt Readings from Last 3 Encounters:  12/10/24 189 lb (85.7 kg)  09/05/24 193 lb (87.5 kg)  05/07/24 188 lb (85.3 kg)   BP Readings from Last 3 Encounters:  12/10/24 110/62  12/10/24 110/68  09/05/24 (!) 180/90   Pulse Readings from Last 3 Encounters:  12/10/24 88  12/10/24 90  09/05/24 90    Renal function: CrCl cannot be calculated (Patient's most recent lab result is older than the maximum 21 days allowed.).  Past Medical History:  Diagnosis Date   Angioedema    Anxiety    Asthma    Depression    Diabetes mellitus, type II (HCC)    Dyslipidemia    Family history of adverse reaction to anesthesia    mother severely confused after general anesthesia   History of CVA (cerebrovascular accident)    HTN (hypertension) 2013   Panic attacks    Pneumonia    Septic embolism (HCC) 02/10/2021   Situational stress    Subacute endocarditis 02/10/2021     Medications Ordered Prior to Encounter[1]  Allergies[2]  Blood pressure 110/68, pulse 90, last menstrual period 11/25/2011.   Assessment/Plan:     1. Hypertension -  Essential hypertension Assessment: Blood pressure controlled in clinic and at home after meds Doing tai Chi No s/sx of hypo or hypertension  Plan: Continue carvedilol  25mg  twice a day, valsartan  320mg  daily, amlodipine  5mg  daily  Follow up as needed      Thank you  Yakov Bergen D Kiwanna Spraker, Pharm.JONETTA SARAN, CPP [facility] [address] [place], [place] [address]  Phone: [phone]; Fax: [phone]            [1]  Current Outpatient Medications on File Prior to Visit  Medication Sig Dispense Refill   acetaminophen  (TYLENOL ) 500 MG tablet Take 1,000 mg by mouth every 8 (eight) hours as needed for moderate pain (pain score 4-6).     clopidogrel  (PLAVIX ) 75 MG tablet TAKE 1 TABLET BY MOUTH EVERY DAY 90 tablet 1   DULoxetine  (CYMBALTA ) 30 MG capsule TAKE 3 CAPSULES BY MOUTH EVERY DAY (Patient taking differently: Take 30 mg by mouth daily.) 90 capsule 5   L-Methylfolate-B6-B12 (FOLTANX) 3-35-2 MG TABS TAKE 1 TABLET BY MOUTH EVERY DAY 90 tablet 1   LANTUS  SOLOSTAR 100 UNIT/ML Solostar Pen Inject 60 Units into the skin at bedtime. (Patient taking differently: Inject 80 Units into the skin at bedtime.)     OZEMPIC, 0.25 OR 0.5 MG/DOSE, 2 MG/3ML SOPN Inject 0.5 mg Subcutaneous once weekly; Duration: 28 days     traMADol  (ULTRAM ) 50 MG tablet TAKE 1 TABLET BY MOUTH 3 TIMES A DAY AS NEEDED FOR MODERATE PAIN. (Patient not taking: Reported on 12/10/2024) 90 tablet 2   Vitamin D , Cholecalciferol, 25 MCG (1000 UT) CAPS 1 capsule.     Baclofen  5 MG TABS Take 1 tablet (5 mg total) by mouth 3 (three) times daily. 90 tablet 5   Insulin  Pen Needle (PEN NEEDLES) 29G X MISC 1 each by Does not apply route daily. 30 each 2   pregabalin  (LYRICA ) 150 MG capsule Take  1 capsule (150 mg total) by mouth 3 (three) times daily. 90 capsule 5   No current facility-administered medications on file prior to visit.  [2]  Allergies Allergen Reactions   Sulfa Antibiotics Swelling   Aldactone  [Spironolactone ]     Inner ear infection    Dulaglutide     Other reaction(s): rash   Empagliflozin     Other reaction(s): recurrent yeast   Iodinated Contrast Media  Other reaction(s): angioedema with CT dye   Lipitor  [Atorvastatin  Calcium ] Diarrhea   Latex Rash

## 2024-12-10 NOTE — Assessment & Plan Note (Signed)
 Assessment: Blood pressure controlled in clinic and at home after meds Doing tai Chi No s/sx of hypo or hypertension  Plan: Continue carvedilol  25mg  twice a day, valsartan  320mg  daily, amlodipine  5mg  daily  Follow up as needed

## 2024-12-10 NOTE — Patient Instructions (Addendum)
 Your blood pressure goal is < 130/71mmHg   Please continue carvedilol  25mg  twice a day, valsartan  320mg  daily, amlodipine  5mg  daily.  Consider talking to the endocrinologist about increase ozempic to 1mg  or switching to Mounjaro.  Continue with Thai Chi!! Continue wearing CPAP machine as this has great benefits on blood pressure.  Important lifestyle changes to control high blood pressure  Intervention  Effect on the BP   Weight loss Weight loss is one of the most effective lifestyle changes for controlling blood pressure. If you're overweight or obese, losing even a small amount of weight can help reduce blood pressure.    Blood pressure can decrease by 1 millimeter of mercury (mmHg) with each kilogram (about 2.2 pounds) of weight lost.   Exercise regularly As a general goal, aim for 30 minutes of moderate physical activity every day.    Regular physical activity can lower blood pressure by 5 - 8 mmHg.   Eat a healthy diet Eat a diet rich in whole grains, fruits, vegetables, lean meat, and low-fat dairy products. Limit processed foods, saturated fat, and sweets.    A heart-healthy diet can lower high blood pressure by 10 mmHg.   Reduce salt (sodium) in your diet Aim for 000mg  of sodium each day. Avoid deli meats, canned food, and frozen microwave meals which are high in sodium.     Limiting sodium can reduce blood pressure by 5 mmHg.   Limit alcohol One drink equals 12 ounces of beer, 5 ounces of wine, or 1.5 ounces of 80-proof liquor.    Limiting alcohol to < 1 drink a day for women or < 2 drinks a day for men can help lower blood pressure by about 4 mmHg.   To check your pressure at home you will need to:   Sit up in a chair, with feet flat on the floor and back supported. Do not cross your ankles or legs. Rest your left arm so that the cuff is about heart level. If the cuff goes on your upper arm, then just relax your arm on the table, arm of the chair, or your lap.  If you have a wrist cuff, hold your wrist against your chest at heart level. Place the cuff snugly around your arm, about 1 inch above the crease of your elbow. The cords should be inside the groove of your elbow.  Sit quietly, with the cuff in place, for about 5 minutes. Then press the power button to start a reading. Do not talk or move while the reading is taking place.  Record your readings on a sheet of paper. Although most cuffs have a memory, it is often easier to see a pattern developing when the numbers are all in front of you.  You can repeat the reading after 1-3 minutes if it is recommended.   Make sure your bladder is empty and you have not had caffeine or tobacco within the last 30 minutes   Always bring your blood pressure log with you to your appointments. If you have not brought your monitor in to be double checked for accuracy, please bring it to your next appointment.   You can find a list of validated (accurate) blood pressure cuffs at: validatebp.org

## 2024-12-10 NOTE — Progress Notes (Signed)
 "    Cardiology Office Note   Date:  12/10/2024   ID:  Sheila Guerrero, DOB 15-Mar-1961, MRN [id number]  PCP:  Seabron Lenis, MD  Cardiologist:   Kaileah Shevchenko, MD   Chief Complaint  Patient presents with   Hypertension       History of Present Illness: Sheila Guerrero is a 64 y.o. female who is seen for follow up of CHF and HTN.  She has a history of HTN, DM type 2, HLD, and prior CVA in 2017. She was admitted in March 2022 with left sided paresthesias and progressive left sided weakness. She was admitted with acute CVA. MRI brain demonstrated-small acute to early subacute infarct in the right thalamus/posterior limb of the internal capsule, small subacute left occipital lobe infarct, moderately advanced chronic small vessel ischemic disease progressed from 2018 and numerous chronic microhemorrhages consistent with chronic hypertension. Carotid dopplers were negative. Echo showed EF 40-45% (new since 2018). Subsequent TEE reportedly showed multiple small vegetations on both MV leaflets with moderate MR and a ? larger sessile vegetation on the tricuspid valve. Patient had no fever or other source of infection. Multiple blood cultures were negative. She was treated for culture negative endocarditis with IV Rocephin  and daptomycin  for 6 weeks. DC'd on Entresto  and beta blocker. She was readmitted in April with confusion and difficulty speaking and walking. MRI showed subacute left thalamic infarct. CT head negative. EEG was normal. TEE was repeated on April 29 showing EF 45-50%. Reportedly smaller vegetation on medial MV leaflet and tricuspid valve. ? Small vegetation on noncoronary AV cusp. Mild MR and TR. I also reviewed her Echo and TEE images. I  did no feel that there was convincing evidence of vegetations. She has some fine filamentous strands noted on the MV of uncertain significance.  Given lack of any clinical evidence of infection and normal blood cultures I think the weight of  evidence is that she never had endocarditis.  She is on Plavix  monotherapy  She had follow up Echo in April 2022 and June 2023 showing normal LV function. EF 60-65%. Repeat Echo this June showed normal LV function  On follow up today she is seen with her husband. Over the past 2-3 months she has noted significant increase in BP readings. Was started on valsartan  and titrated to 320 mg daily but BP still poorly controlled. Readings 145-200 systolic. She feels fine except is fatigued. No chest pain, dyspnea, edema, palpitations or new Neurologic symptoms. She also had bad diarrhea on lipitor  and this was stopped. Tried again and symptoms recurred.    Renal duplex showed no RAS.   We increased her Coreg  and added amlodipine . We also started her on Crestor  20 mg daily. On follow up today she is tolerating the medication well. Notes she is not sleeping all the time. No diarrhea. BP is excellent.   Past Medical History:  Diagnosis Date   Angioedema    Anxiety    Asthma    Depression    Diabetes mellitus, type II (HCC)    Dyslipidemia    Family history of adverse reaction to anesthesia    mother severely confused after general anesthesia   History of CVA (cerebrovascular accident)    HTN (hypertension) 2013   Panic attacks    Pneumonia    Septic embolism (HCC) 02/10/2021   Situational stress    Subacute endocarditis 02/10/2021    Past Surgical History:  Procedure Laterality Date   BUBBLE STUDY  02/10/2021  Procedure: BUBBLE STUDY;  Surgeon: Claudene Pacific, MD;  Location: [facility] ENDOSCOPY;  Service: Cardiovascular;;   CHOLECYSTECTOMY  2010   TEE WITHOUT CARDIOVERSION N/A 02/10/2021   Procedure: TRANSESOPHAGEAL ECHOCARDIOGRAM (TEE);  Surgeon: Claudene Pacific, MD;  Location: [facility] ENDOSCOPY;  Service: Cardiovascular;  Laterality: N/A;   TEE WITHOUT CARDIOVERSION N/A 03/27/2021   Procedure: TRANSESOPHAGEAL ECHOCARDIOGRAM (TEE);  Surgeon: Claudene Pacific, MD;  Location: [facility] ENDOSCOPY;  Service:  Cardiovascular;  Laterality: N/A;     Current Outpatient Medications  Medication Sig Dispense Refill   acetaminophen  (TYLENOL ) 500 MG tablet Take 1,000 mg by mouth every 8 (eight) hours as needed for moderate pain (pain score 4-6).     amLODipine  (NORVASC ) 5 MG tablet Take 1 tablet (5 mg total) by mouth daily. 90 tablet 3   Baclofen  5 MG TABS Take 1 tablet (5 mg total) by mouth 3 (three) times daily. 90 tablet 5   benzonatate  (TESSALON ) 200 MG capsule 1 capsule as needed.     carvedilol  (COREG ) 25 MG tablet Take 1 tablet (25 mg total) by mouth 2 (two) times daily. 180 tablet 3   clopidogrel  (PLAVIX ) 75 MG tablet TAKE 1 TABLET BY MOUTH EVERY DAY 90 tablet 1   DULoxetine  (CYMBALTA ) 30 MG capsule TAKE 3 CAPSULES BY MOUTH EVERY DAY (Patient taking differently: Take 30 mg by mouth daily.) 90 capsule 5   EPINEPHrine  0.3 mg/0.3 mL IJ SOAJ injection Inject 0.3 mg into the muscle daily as needed for anaphylaxis. 1 each 1   Insulin  Pen Needle (PEN NEEDLES) 29G X MISC 1 each by Does not apply route daily. 30 each 2   L-Methylfolate-B6-B12 (FOLTANX) 3-35-2 MG TABS TAKE 1 TABLET BY MOUTH EVERY DAY 90 tablet 1   LANTUS  SOLOSTAR 100 UNIT/ML Solostar Pen Inject 60 Units into the skin at bedtime. (Patient taking differently: Inject 80 Units into the skin at bedtime.)     OZEMPIC, 0.25 OR 0.5 MG/DOSE, 2 MG/3ML SOPN Inject 0.5 mg Subcutaneous once weekly; Duration: 28 days     pregabalin  (LYRICA ) 150 MG capsule Take 1 capsule (150 mg total) by mouth 3 (three) times daily. 90 capsule 5   pregabalin  (LYRICA ) 50 MG capsule Take 50 mg by mouth 2 (two) times daily.     rosuvastatin  (CRESTOR ) 20 MG tablet Take 1 tablet (20 mg total) by mouth daily. 90 tablet 3   valsartan  (DIOVAN ) 320 MG tablet Take 1 tablet (320 mg total) by mouth daily. 90 tablet 3   Vitamin D , Cholecalciferol, 25 MCG (1000 UT) CAPS 1 capsule.     traMADol  (ULTRAM ) 50 MG tablet TAKE 1 TABLET BY MOUTH 3 TIMES A DAY AS NEEDED FOR MODERATE PAIN.  (Patient not taking: Reported on 12/10/2024) 90 tablet 2   No current facility-administered medications for this visit.    Allergies:   Sulfa antibiotics, Aldactone  [spironolactone ], Dulaglutide, Empagliflozin, Iodinated contrast media, Lipitor  [atorvastatin  calcium ], and Latex    Social History:  The patient  reports that she has never smoked. She has never used smokeless tobacco. She reports current alcohol use. She reports that she does not use drugs.   Family History:  The patient's family history includes CVA in her maternal aunt; Diabetes in her maternal aunt; Hypertension in her father and mother; Other in her mother; Stroke in her father.    ROS:  Please see the history of present illness.   Otherwise, review of systems are positive for none.   All other systems are reviewed and negative.    PHYSICAL  EXAM: VS:  BP 110/62 (BP Location: Left Arm, Patient Position: Sitting, Cuff Size: Normal)   Pulse 88   Ht 5' 5 (1.651 m)   Wt 189 lb (85.7 kg)   LMP 11/25/2011   SpO2 97%   BMI 31.45 kg/m  , BMI Body mass index is 31.45 kg/m. GEN: Well nourished, well developed, in no acute distress  HEENT: normal  Neck: no JVD, carotid bruits, or masses Cardiac: RRR; no murmurs, rubs, or gallops,no edema  Respiratory:  clear to auscultation bilaterally, normal work of breathing GI: soft, nontender, nondistended, + BS MS: no deformity or atrophy  Skin: warm and dry, no rash Neuro:  Strength and sensation are intact Psych: euthymic mood, full affect        Recent Labs: No results found for requested labs within last 365 days.    Lipid Panel    Component Value Date/Time   CHOL 98 (L) 03/22/2022 1038   TRIG 78 03/22/2022 1038   HDL 44 03/22/2022 1038   CHOLHDL 2.2 03/22/2022 1038   CHOLHDL 8.4 03/13/2021 0445   VLDL 77 (H) 03/13/2021 0445   LDLCALC 38 03/22/2022 1038    Dated 05/12/22: glucose 130, otherwise CMET normal. Cholesterol 112, triglycerides 114, HDL 37, LDL 55. A1c  6.5%. Dated 10/03/23: cholesterol 154, triglycerides 231, HDL 40, LDL 76.  Dated 12/23/23: A1c 10.4. CMET and TSH normal Dated 08/02/24: normal CMET. A1c 8.2%.  Dated 08/23/24: cholesterol 258, triglycerides 310, HDL 39, LDL 160   Wt Readings from Last 3 Encounters:  12/10/24 189 lb (85.7 kg)  09/05/24 193 lb (87.5 kg)  05/07/24 188 lb (85.3 kg)      Other studies Reviewed: Additional studies/ records that were reviewed today include:    Echo 03/23/22: IMPRESSIONS     1. Left ventricular ejection fraction, by estimation, is 60 to 65%. The  left ventricle has normal function. The left ventricle has no regional  wall motion abnormalities. There is moderate left ventricular hypertrophy.  Left ventricular diastolic  parameters were normal.   2. Right ventricular systolic function is normal. The right ventricular  size is normal. Tricuspid regurgitation signal is inadequate for assessing  PA pressure.   3. The mitral valve is normal in structure. Trivial mitral valve  regurgitation.   4. The aortic valve was not well visualized. Aortic valve regurgitation  is not visualized. No aortic stenosis is present.   5. The inferior vena cava is normal in size with greater than 50%  respiratory variability, suggesting right atrial pressure of 3 mmHg.   Echo 6/624: IMPRESSIONS     1. Left ventricular ejection fraction, by estimation, is 65 to 70%. The  left ventricle has normal function. The left ventricle has no regional  wall motion abnormalities. There is moderate concentric left ventricular  hypertrophy. Left ventricular  diastolic parameters are consistent with Grade I diastolic dysfunction  (impaired relaxation).   2. Right ventricular systolic function is normal. The right ventricular  size is normal. Tricuspid regurgitation signal is inadequate for assessing  PA pressure.   3. The mitral valve is grossly normal. Trivial mitral valve  regurgitation.   4. The aortic valve is  tricuspid. Aortic valve regurgitation is not  visualized.   5. The inferior vena cava is normal in size with greater than 50%  respiratory variability, suggesting right atrial pressure of 3 mmHg.   Comparison(s): Changes from prior study are noted. 03/23/2022: LVEF 60-65%.   Echo 05/21/24: IMPRESSIONS     1.  Left ventricular ejection fraction, by estimation, is 60 to 65%. Left  ventricular ejection fraction by 3D volume is 60 %. The left ventricle has  normal function. The left ventricle has no regional wall motion  abnormalities. There is mild left  ventricular hypertrophy. Left ventricular diastolic parameters are  indeterminate.   2. Right ventricular systolic function is normal. The right ventricular  size is normal. Tricuspid regurgitation signal is inadequate for assessing  PA pressure.   3. The mitral valve is grossly normal. Trivial mitral valve  regurgitation. No evidence of mitral stenosis.   4. The aortic valve is tricuspid. Aortic valve regurgitation is not  visualized. No aortic stenosis is present.   5. The inferior vena cava is normal in size with greater than 50%  respiratory variability, suggesting right atrial pressure of 3 mmHg.     ASSESSMENT AND PLAN:  1.  Recurrent CVA. This appears to be related to small vessel cerebrovascular disease related to HTN, DM, and HLD. Will continue Plavix .   2. Chronic systolic CHF. EF initially reduced at 45-50%. Echo showed normal LV function off Entresto .   3. HTN. Significant increase- now with excellent control. Based on recent lab doubt hypoaldo. Renal duplex normal.  Continue Coreg   25 mg bid, valsartan  320 mg daily,  and  amlodipine  5 mg daily.  Sodium restriction  4. Hypercholesterolemia. Intolerant of lipitor  due to diarrhea. LDL 160. Now on  Crestor  20 mg daily. Check fasting labs.   5. DM per primary care. A1c 8.2%. per Endocrinology.   Follow up in 6 months.  Signed, Sharde Gover, MD  12/10/2024 11:42 AM     [facility] [address] [place], [place], [address] Phone [phone], Fax [phone] "

## 2024-12-10 NOTE — Addendum Note (Signed)
 Addended by: CHRISTIANNE CHANNING PARAS on: 12/10/2024 11:47 AM   Modules accepted: Orders

## 2024-12-10 NOTE — Patient Instructions (Addendum)
 Medication Instructions:  Take Crestor  20 mg daily Continue all other medications *If you need a refill on your cardiac medications before your next appointment, please call your pharmacy*  Lab Work: Have bmet,lipid and hepatic panels today   Testing/Procedures: None ordered  Follow-Up: At [facility], you and your health needs are our priority.  As part of our continuing mission to provide you with exceptional heart care, our providers are all part of one team.  This team includes your primary Cardiologist (physician) and Advanced Practice Providers or APPs (Physician Assistants and Nurse Practitioners) who all work together to provide you with the care you need, when you need it.  Your next appointment:  6 months    Call in April to schedule July appointment     Provider:  Dr.Jordan   We recommend signing up for the patient portal called MyChart.  Sign up information is provided on this After Visit Summary.  MyChart is used to connect with patients for Virtual Visits (Telemedicine).  Patients are able to view lab/test results, encounter notes, upcoming appointments, etc.  Non-urgent messages can be sent to your provider as well.   To learn more about what you can do with MyChart, go to [email or web address]

## 2024-12-11 ENCOUNTER — Ambulatory Visit: Admitting: Neurology

## 2024-12-11 ENCOUNTER — Ambulatory Visit: Payer: Self-pay | Admitting: Cardiology

## 2024-12-11 ENCOUNTER — Encounter: Payer: Self-pay | Admitting: Neurology

## 2024-12-11 VITALS — BP 128/74 | HR 96 | Ht 65.0 in | Wt 190.6 lb

## 2024-12-11 DIAGNOSIS — G890 Central pain syndrome: Secondary | ICD-10-CM | POA: Diagnosis not present

## 2024-12-11 LAB — BASIC METABOLIC PANEL WITH GFR
BUN/Creatinine Ratio: 19 (ref 12–28)
BUN: 21 mg/dL (ref 8–27)
CO2: 20 mmol/L (ref 20–29)
Calcium: 9 mg/dL (ref 8.7–10.3)
Chloride: 107 mmol/L — ABNORMAL HIGH (ref 96–106)
Creatinine, Ser: 1.1 mg/dL — ABNORMAL HIGH (ref 0.57–1.00)
Glucose: 187 mg/dL — ABNORMAL HIGH (ref 70–99)
Potassium: 4.6 mmol/L (ref 3.5–5.2)
Sodium: 143 mmol/L (ref 134–144)
eGFR: 57 mL/min/1.73 — ABNORMAL LOW

## 2024-12-11 LAB — LIPID PANEL
Chol/HDL Ratio: 2.6 ratio (ref 0.0–4.4)
Cholesterol, Total: 110 mg/dL (ref 100–199)
HDL: 42 mg/dL
LDL Chol Calc (NIH): 46 mg/dL (ref 0–99)
Triglycerides: 122 mg/dL (ref 0–149)
VLDL Cholesterol Cal: 22 mg/dL (ref 5–40)

## 2024-12-11 LAB — HEPATIC FUNCTION PANEL
ALT: 25 IU/L (ref 0–32)
AST: 20 IU/L (ref 0–40)
Albumin: 4.3 g/dL (ref 3.9–4.9)
Alkaline Phosphatase: 89 IU/L (ref 49–135)
Bilirubin Total: 0.5 mg/dL (ref 0.0–1.2)
Bilirubin, Direct: 0.19 mg/dL (ref 0.00–0.40)
Total Protein: 6.7 g/dL (ref 6.0–8.5)

## 2024-12-11 MED ORDER — PREGABALIN 25 MG PO CAPS: 25.0000 mg | ORAL_CAPSULE | Freq: Every day | ORAL | 5 refills | Status: DC

## 2024-12-11 NOTE — Patient Instructions (Signed)
 Continue duloxetine  30mg  daily When ready, start pregablin 25mg  at bedtime.  If tolerating but want to try increasing dose, contact me

## 2024-12-13 ENCOUNTER — Other Ambulatory Visit: Payer: Self-pay | Admitting: Cardiology

## 2024-12-13 NOTE — Telephone Encounter (Signed)
 In accordance with refill protocols, please review and address the following requirements before this medication refill can be authorized:  Labs pt needs labs done within 365 days. Would Dr. Jordan like to refill this medication?

## 2024-12-19 ENCOUNTER — Other Ambulatory Visit: Payer: Self-pay | Admitting: Neurology

## 2024-12-19 ENCOUNTER — Telehealth: Payer: Self-pay | Admitting: Neurology

## 2024-12-19 MED ORDER — PREGABALIN 50 MG PO CAPS: 50.0000 mg | ORAL_CAPSULE | Freq: Every day | ORAL | 5 refills | Status: DC

## 2024-12-19 MED ORDER — PREGABALIN 50 MG PO CAPS: 50.0000 mg | ORAL_CAPSULE | Freq: Two times a day (BID) | ORAL | 5 refills | Status: AC

## 2024-12-19 NOTE — Telephone Encounter (Signed)
 Pt called in this morning and she stated that the pregabalin  (LYRICA ) 25 MG needs to be increased back to 50, due to  Pt is still in pain.  Please call. Thanks

## 2024-12-19 NOTE — Telephone Encounter (Signed)
 Patient advised of Dr.Jaffe note, Lyrica  50mg  at bedtime sent to [facility] on [address].    Per patient she been having more pain on the left side that she has increase to 50 mg in am and 50 mg at bedtime.    Please advise

## 2024-12-20 NOTE — Telephone Encounter (Signed)
 Patient advised.    Sent in new prescription for lyrica  50mg  twice daily. Please contact pharmacy to clarify this change    Spoke to the pharmacy, Will fill the 50 Mg BID

## 2024-12-25 ENCOUNTER — Encounter: Payer: Self-pay | Admitting: *Deleted

## 2024-12-25 NOTE — Progress Notes (Signed)
 Sheila Guerrero                                          MRN: [id number]   12/25/2024   The [facility] Quality Team Specialist reviewed this patient medical record for the purposes of chart review for care gap closure. The following were reviewed: chart review for care gap closure-kidney health evaluation for diabetes:eGFR  and uACR.    [facility]BCI Quality Team

## 2025-06-18 ENCOUNTER — Ambulatory Visit: Payer: Self-pay | Admitting: Neurology
